# Patient Record
Sex: Male | Born: 1960 | Race: White | Hispanic: No | Marital: Single | Smoking: Former smoker
Health system: Southern US, Community
[De-identification: ages and names within clinical notes are randomized; demographics above are authoritative.]

## PROBLEM LIST (undated history)

## (undated) DIAGNOSIS — I499 Cardiac arrhythmia, unspecified: Secondary | ICD-10-CM

## (undated) DIAGNOSIS — I10 Essential (primary) hypertension: Secondary | ICD-10-CM

## (undated) DIAGNOSIS — I509 Heart failure, unspecified: Secondary | ICD-10-CM

## (undated) DIAGNOSIS — E785 Hyperlipidemia, unspecified: Secondary | ICD-10-CM

## (undated) DIAGNOSIS — I2510 Atherosclerotic heart disease of native coronary artery without angina pectoris: Secondary | ICD-10-CM

## (undated) DIAGNOSIS — I219 Acute myocardial infarction, unspecified: Secondary | ICD-10-CM

## (undated) DIAGNOSIS — I4891 Unspecified atrial fibrillation: Secondary | ICD-10-CM

---

## 2006-05-09 ENCOUNTER — Inpatient Hospital Stay (HOSPITAL_COMMUNITY): Admission: EM | Admit: 2006-05-09 | Discharge: 2006-05-11 | Payer: Self-pay | Admitting: Emergency Medicine

## 2006-06-04 ENCOUNTER — Ambulatory Visit: Payer: Self-pay | Admitting: Family Medicine

## 2006-06-25 ENCOUNTER — Ambulatory Visit: Payer: Self-pay | Admitting: *Deleted

## 2006-09-27 ENCOUNTER — Emergency Department (HOSPITAL_COMMUNITY): Admission: EM | Admit: 2006-09-27 | Discharge: 2006-09-27 | Payer: Self-pay | Admitting: *Deleted

## 2007-06-17 ENCOUNTER — Ambulatory Visit: Payer: Self-pay | Admitting: Internal Medicine

## 2008-01-20 ENCOUNTER — Ambulatory Visit: Payer: Self-pay | Admitting: Family Medicine

## 2008-01-20 LAB — CONVERTED CEMR LAB
ALT: 64 units/L — ABNORMAL HIGH (ref 0–53)
AST: 33 units/L (ref 0–37)
Albumin: 4.6 g/dL (ref 3.5–5.2)
Alkaline Phosphatase: 59 units/L (ref 39–117)
Basophils Absolute: 0 10*3/uL (ref 0.0–0.1)
Basophils Relative: 0 % (ref 0–1)
Chloride: 101 meq/L (ref 96–112)
Eosinophils Absolute: 0.3 10*3/uL (ref 0.0–0.7)
HDL: 68 mg/dL (ref >39)
LDL Cholesterol: 170 mg/dL — ABNORMAL HIGH (ref 0–99)
MCHC: 34.3 g/dL (ref 30.0–36.0)
MCV: 92.7 fL (ref 78.0–100.0)
Neutro Abs: 4.5 10*3/uL (ref 1.7–7.7)
Neutrophils Relative %: 58 % (ref 43–77)
Platelets: 220 10*3/uL (ref 150–400)
Potassium: 4.6 meq/L (ref 3.5–5.3)
Sodium: 139 meq/L (ref 135–145)
TSH: 1.784 microintl units/mL (ref 0.350–4.50)
Total Protein: 7.7 g/dL (ref 6.0–8.3)

## 2008-03-23 IMAGING — CR DG CHEST 2V
2 series · 2 of 2 positions shown · non-contrast
Comparison: none

HISTORY: Cough, congestion, dyspnea

CHEST 2 VIEWS:
No prior study for comparison.
Normal heart size, mediastinal contours, and vascularity.
Mild bronchitic changes.
No infiltrate, effusion, or pneumothorax.
Bones unremarkable.

[w chest pa]
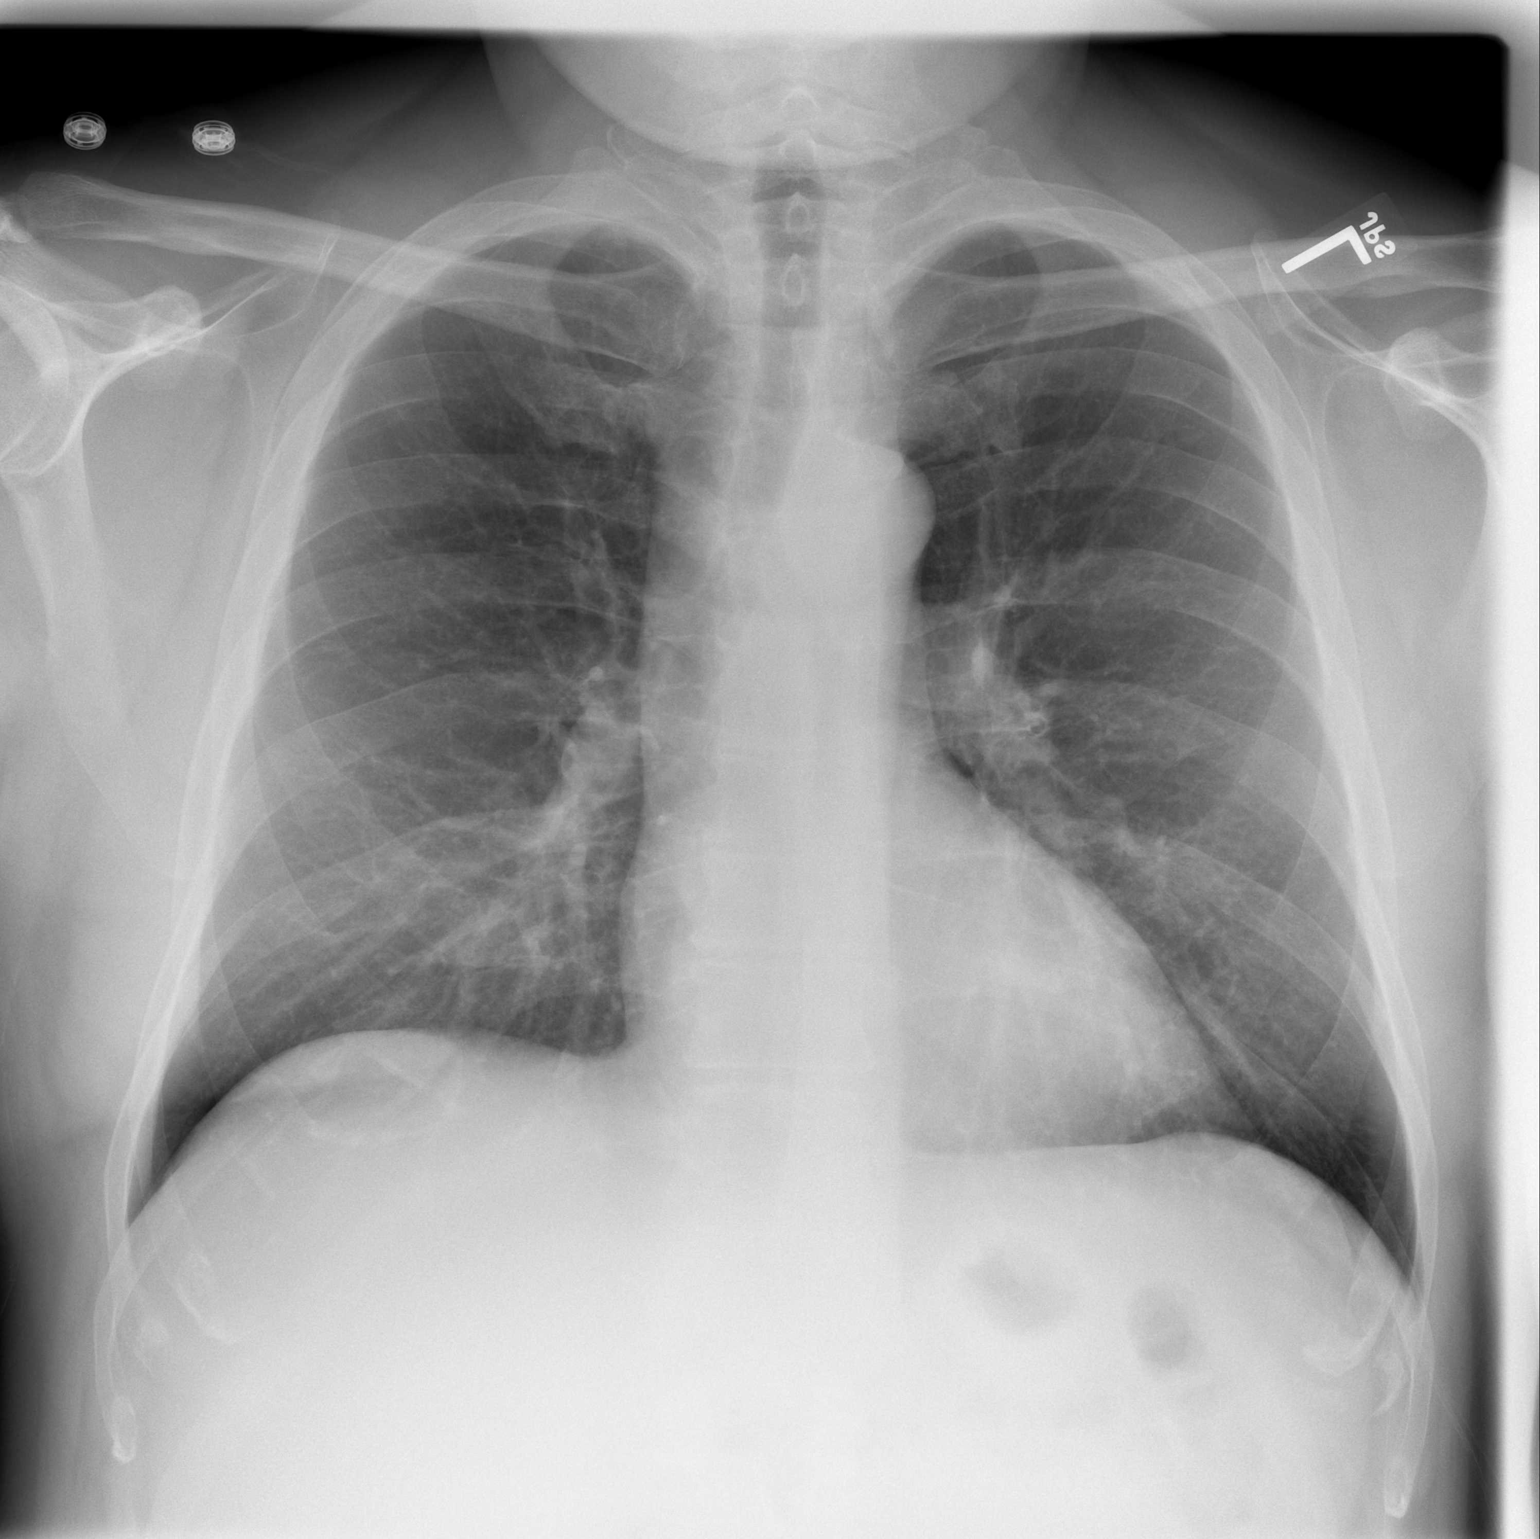

[w chest lat]
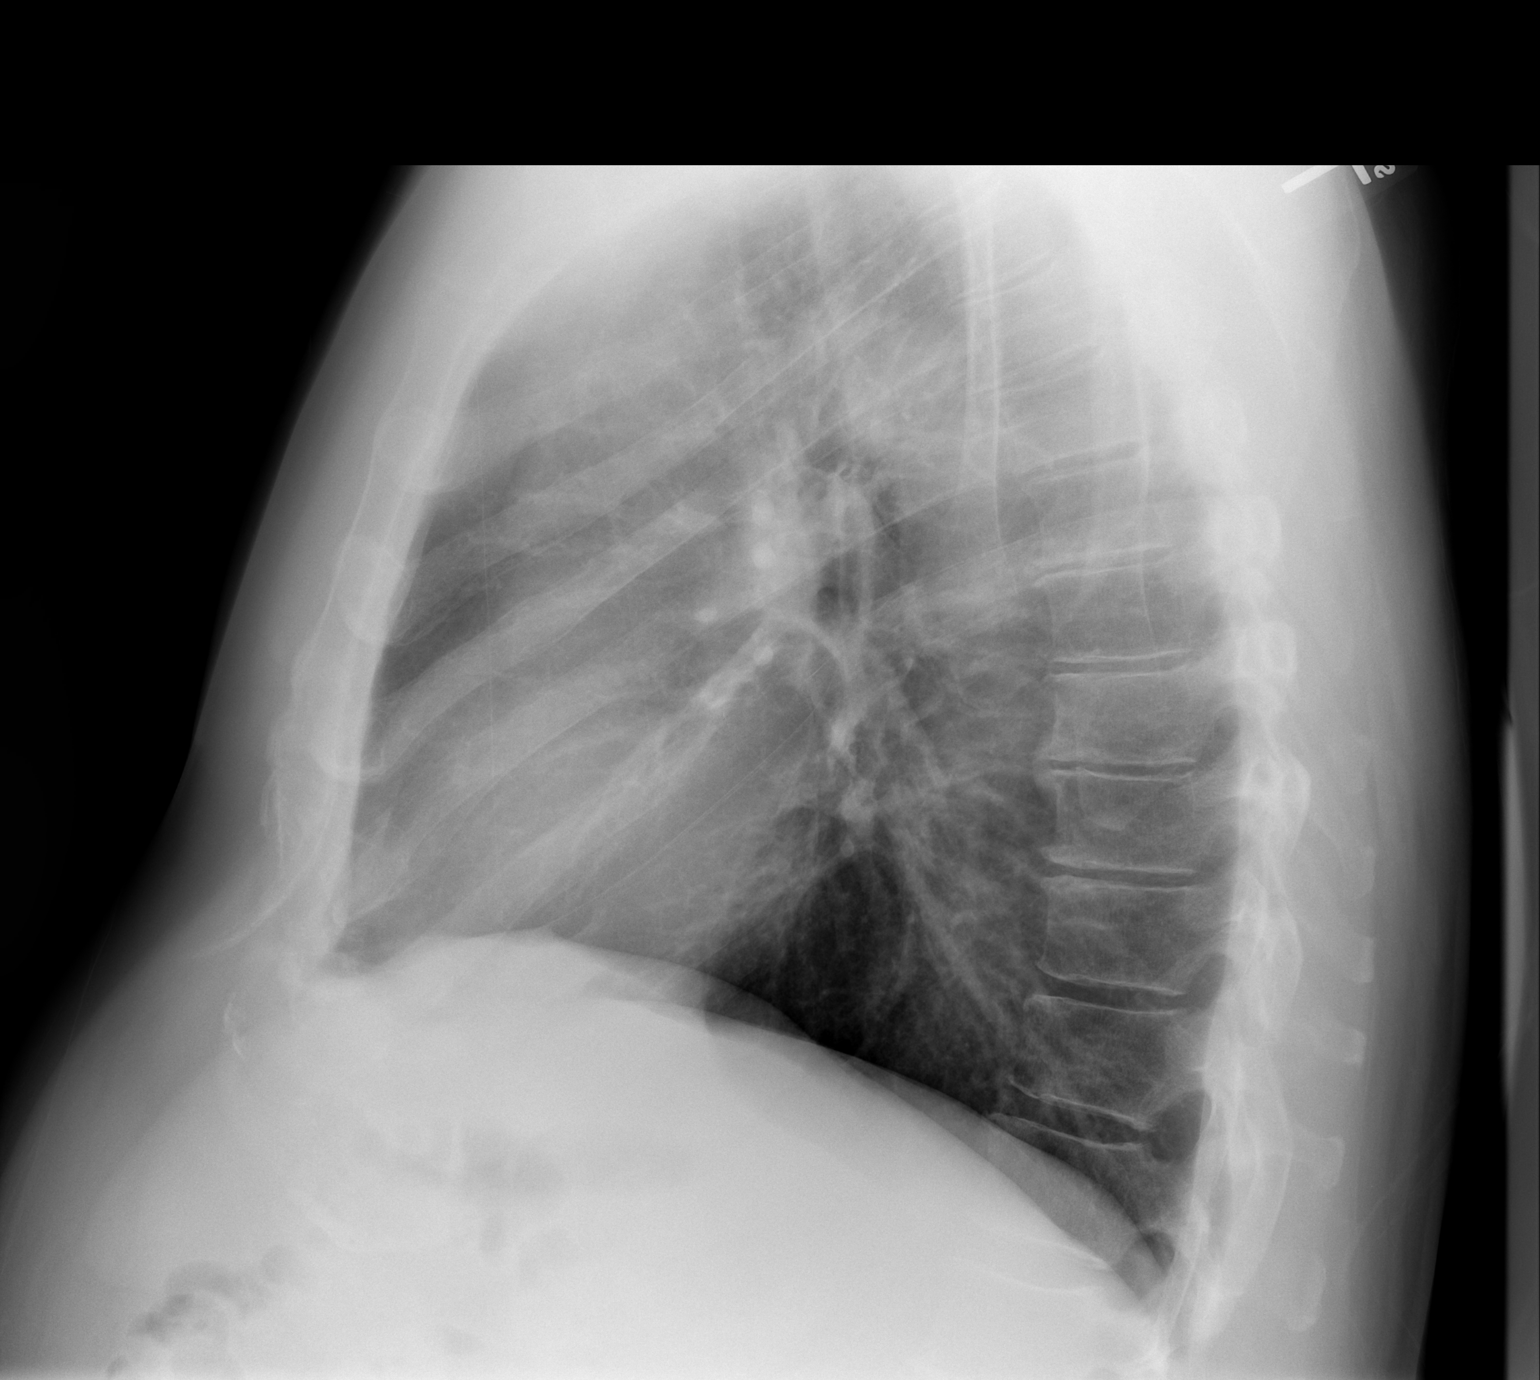

[2 of 2 positions shown; findings below may reference images not displayed]

IMPRESSION: Mild bronchitic changes.

## 2008-12-22 ENCOUNTER — Ambulatory Visit: Payer: Self-pay | Admitting: Internal Medicine

## 2009-03-11 ENCOUNTER — Ambulatory Visit: Payer: Self-pay | Admitting: Internal Medicine

## 2009-03-31 ENCOUNTER — Ambulatory Visit: Payer: Self-pay | Admitting: Internal Medicine

## 2009-03-31 LAB — CONVERTED CEMR LAB
ALT: 30 units/L (ref 0–53)
AST: 29 units/L (ref 0–37)
Calcium: 9 mg/dL (ref 8.4–10.5)
Chloride: 101 meq/L (ref 96–112)
Creatinine, Ser: 0.95 mg/dL (ref 0.40–1.50)
Potassium: 5 meq/L (ref 3.5–5.3)
Sodium: 137 meq/L (ref 135–145)
Total CHOL/HDL Ratio: 3.2
Total Protein: 7 g/dL (ref 6.0–8.3)
VLDL: 23 mg/dL (ref 0–40)

## 2009-04-07 ENCOUNTER — Ambulatory Visit: Payer: Self-pay | Admitting: Internal Medicine

## 2009-06-07 ENCOUNTER — Ambulatory Visit: Payer: Self-pay | Admitting: Internal Medicine

## 2009-06-07 LAB — CONVERTED CEMR LAB
AST: 22 units/L (ref 0–37)
Albumin: 4.8 g/dL (ref 3.5–5.2)
Alkaline Phosphatase: 51 units/L (ref 39–117)
Chloride: 103 meq/L (ref 96–112)
Glucose, Bld: 87 mg/dL (ref 70–99)
Hgb A1c MFr Bld: 5.7 % — ABNORMAL HIGH (ref <5.7)
LDL Cholesterol: 115 mg/dL — ABNORMAL HIGH (ref 0–99)
Potassium: 4.3 meq/L (ref 3.5–5.3)
Sodium: 138 meq/L (ref 135–145)
Total Protein: 7.7 g/dL (ref 6.0–8.3)

## 2009-09-02 ENCOUNTER — Ambulatory Visit: Payer: Self-pay | Admitting: Internal Medicine

## 2010-06-16 NOTE — Cardiovascular Report (Signed)
NAME:  Jeremiah Dixon, Jeremiah Dixon NO.:  [id number]   MEDICAL RECORD NO.:  [id number]          PATIENT TYPE:  INP   LOCATION:  6529                         FACILITY:  [facility]   PHYSICIAN:  Lyn Records, M.D.   DATE OF BIRTH:  [birth year]   DATE OF PROCEDURE:  05/10/2006  DATE OF DISCHARGE:                            CARDIAC CATHETERIZATION   INDICATIONS:  Non-ST-elevation myocardial infarction.   PROCEDURES:  1. Left heart catheterization.  2. Coronary angiography.  3. Left ventriculography.   DESCRIPTION:  After informed consent, a 6-French sheath was placed in  the right femoral artery using the modified Seldinger technique.  A 6-  [place] A2 multipurpose catheter was used for hemodynamic recordings,  left ventriculography by hand injection, and selective left coronary  angiography.  JR-4 was used for right coronary angiography.  Angio-Seal  was used for hemodynamic control after the procedure without  complications.   RESULTS:   HEMODYNAMIC DATA:  1. Aortic pressure 165/107.  2. Left ventricular pressure 166/14.   LEFT VENTRICULOGRAPHY:  Cinefluoroscopy prior to contrast demonstrates  distal right coronary, mid right coronary, and proximal circumflex  calcification.  Left ventricular cavity size is upper normal.  Contractility is symmetrically normal.  EF is 60%.  No MR.   CORONARY ANGIOGRAPHY:  1. Left main coronary:  Widely patent.  2. Left anterior descending coronary:  LAD contains luminal      irregularities throughout the proximal, mid and distal vessel.  No      significant stenoses are noted.  Several small diagonal branches      arise.  3. Circumflex:  Circumflex coronary artery is a moderate-size vessel      giving origin to a large second obtuse marginal and a small first      and third obtuse marginal.  The first obtuse marginal is subtotally      occluded.  The proximal circumflex in the region where this      marginal arises is slightly hazy.  No  definable significant      obstructive lesion is noted in this region, but there is      generalized narrowing less than 30% in this region of circumflex.  4. Right coronary:  The right coronary is large vessel giving PDA and      left ventricular branches.  Diffuse luminal irregularities less      than 40% are noted throughout the vessel and in the PDA.   CONCLUSION:  1. Acute coronary syndrome with probable thrombotic lesion in the      small first obtuse marginal origin and possibly some involvement in      the proximal circumflex but without significant obstructive lesion      at present.  2. Luminal irregularities in the left anterior descending, circumflex      and right coronary.  3. Normal left ventricular function.  4. Elevated blood pressure.   PLAN:  Aggressive risk factor modification including beta blocker  therapy for blood pressure and myocardial protection, aspirin as an  antiplatelet agent, and lipid therapy for control of significant  hyperlipidemia.  Nitroglycerin  p.r.n. for chest pain.   PLAN:  Discharge in a.m. if no discomfort off of Lovenox.  Ambulate 2  hours post procedure.  Follow up in 2 weeks.      Lyn Records, M.D.  Electronically Signed     HWS/MEDQ  D:  05/10/2006  T:  05/10/2006  Job:  [id number]

## 2010-06-16 NOTE — Discharge Summary (Signed)
NAME:  [name], [name] NO.:  [id number]   MEDICAL RECORD NO.:  [id number]          PATIENT TYPE:  INP   LOCATION:  6529                         FACILITY:  [facility]   PHYSICIAN:  Lyn Records, M.D.   DATE OF BIRTH:  28-May-1960   DATE OF ADMISSION:  05/09/2006  DATE OF DISCHARGE:  05/11/2006                               DISCHARGE SUMMARY   DISCHARGE DIAGNOSES:  1. Non-ST segment elevated myocardial infarction, status post cardiac      catheterization on May 10, 2006 under the care of Dr. Verdis Prime.  2. Smoker, tobacco cessation counseling.  3. Alcohol use, cessation counseling.  4. Dyslipidemia, treated.   Jeremiah Dixon is a 50 year old male patient who had intermittent 3 week  history of substernal chest pain/flu symptoms.  On arrival to the ER, he  had no chest pain, but wanted to be checked out.   His history is significant for fatigue.  He initially told me he did not  smoke, but apparently he does.  We then provided him with smoking  cessation counseling.  In addition, he said he drinks 2 quarts of beer  a day.  We did provide alcohol cessation counseling.   Lab studies were then performed and showed the following:  Hemoglobin  14.9, hematocrit 42.5, platelets 225, white count 5.2.  Sodium 137,  potassium 4.5, BUN 20.  LFTs normal.  Maximum CK is 174 with an MB  fraction of 5.5.  Troponin maximal at 0.41.  Total cholesterol 253, HDL  54, LDL 178, triglycerides 104.  TSH 2.198.  Also creatinine 1.0.   EKG shows normal sinus rhythm, rate 79 with T wave inversion in leads 3  and AVF.  No Q waves.   Because of the patient's non-ST segment elevated myocardial infarction,  he underwent cardiac catheterization on May 10, 2006 and was found to  have a small OM1 lesion that was hazy.  This vessel was subtotaled.  We  felt that medical management was the only option.  Otherwise, the  patient had nonobstructive disease with less than 40% lesions in the  LAD, circumflex.  His LV function was normal.  He was hypertensive  during the case and definitely needs to be aggressively treated.   The following day, the patient was ready for discharge to home.  He was  discharged to home on the following medications:  1. Enteric-coated aspirin 325 mg a day.  2. Metoprolol ER 50 mg a day.  3. Sublingual nitroglycerin p.r.n. chest pain.  4. Simvastatin 40 mg a day.   He is to remain on a low-sodium, heart-healthy diet.  Activity as per  cardiac rehabilitation.  Follow up with Dr. Katrinka Blazing in 2 weeks.   ADDENDUM:  Patient will need to find a primary care physician for  routine health care issues.      Guy Franco, P.A.      Lyn Records, M.D.  Electronically Signed    LB/MEDQ  D:  06/28/2006  T:  06/29/2006  Job:  [id number]

## 2014-04-13 ENCOUNTER — Encounter: Payer: Self-pay | Admitting: Emergency Medicine

## 2014-04-13 NOTE — Telephone Encounter (Signed)
WRONG PT 

## 2021-06-06 ENCOUNTER — Emergency Department (HOSPITAL_COMMUNITY)
Admission: EM | Admit: 2021-06-06 | Discharge: 2021-06-06 | Discharge status: Against Medical Advice | Payer: Managed Care, Other (non HMO) | Attending: Emergency Medicine | Admitting: Emergency Medicine

## 2021-06-06 ENCOUNTER — Encounter (HOSPITAL_COMMUNITY): Payer: Self-pay | Admitting: Emergency Medicine

## 2021-06-06 ENCOUNTER — Emergency Department (HOSPITAL_COMMUNITY): Payer: Managed Care, Other (non HMO)

## 2021-06-06 DIAGNOSIS — R0789 Other chest pain: Secondary | ICD-10-CM | POA: Insufficient documentation

## 2021-06-06 DIAGNOSIS — R079 Chest pain, unspecified: Secondary | ICD-10-CM

## 2021-06-06 DIAGNOSIS — E876 Hypokalemia: Secondary | ICD-10-CM | POA: Insufficient documentation

## 2021-06-06 DIAGNOSIS — I10 Essential (primary) hypertension: Secondary | ICD-10-CM | POA: Insufficient documentation

## 2021-06-06 DIAGNOSIS — E871 Hypo-osmolality and hyponatremia: Secondary | ICD-10-CM | POA: Insufficient documentation

## 2021-06-06 DIAGNOSIS — E119 Type 2 diabetes mellitus without complications: Secondary | ICD-10-CM | POA: Insufficient documentation

## 2021-06-06 HISTORY — DX: Acute myocardial infarction, unspecified: I21.9

## 2021-06-06 LAB — BASIC METABOLIC PANEL
Anion gap: 10 (ref 5–15)
BUN: 7 mg/dL (ref 6–20)
CO2: 20 mmol/L — ABNORMAL LOW (ref 22–32)
Calcium: 9.1 mg/dL (ref 8.9–10.3)
Chloride: 93 mmol/L — ABNORMAL LOW (ref 98–111)
Creatinine, Ser: 0.84 mg/dL (ref 0.61–1.24)
GFR, Estimated: >60 mL/min (ref >60)
Glucose, Bld: 113 mg/dL — ABNORMAL HIGH (ref 70–99)
Potassium: 3.2 mmol/L — ABNORMAL LOW (ref 3.5–5.1)
Sodium: 123 mmol/L — ABNORMAL LOW (ref 135–145)

## 2021-06-06 LAB — CBC
HCT: 40 % (ref 39.0–52.0)
Hemoglobin: 14 g/dL (ref 13.0–17.0)
MCH: 32.3 pg (ref 26.0–34.0)
MCHC: 35 g/dL (ref 30.0–36.0)
MCV: 92.2 fL (ref 80.0–100.0)
Platelets: 238 10*3/uL (ref 150–400)
RBC: 4.34 MIL/uL (ref 4.22–5.81)
RDW: 12 % (ref 11.5–15.5)
WBC: 7 10*3/uL (ref 4.0–10.5)
nRBC: 0 % (ref 0.0–0.2)

## 2021-06-06 LAB — URINALYSIS, ROUTINE W REFLEX MICROSCOPIC
Bilirubin Urine: NEGATIVE
Glucose, UA: NEGATIVE mg/dL
Hgb urine dipstick: NEGATIVE
Ketones, ur: NEGATIVE mg/dL
Leukocytes,Ua: NEGATIVE
Nitrite: NEGATIVE
Protein, ur: NEGATIVE mg/dL
Specific Gravity, Urine: 1.001 — ABNORMAL LOW (ref 1.005–1.030)
pH: 6 (ref 5.0–8.0)

## 2021-06-06 LAB — TROPONIN I (HIGH SENSITIVITY)
Troponin I (High Sensitivity): 20 ng/L — ABNORMAL HIGH (ref <18)
Troponin I (High Sensitivity): 39 ng/L — ABNORMAL HIGH (ref <18)

## 2021-06-06 MED ORDER — POTASSIUM CHLORIDE CRYS ER 20 MEQ PO TBCR
20.0000 meq | EXTENDED_RELEASE_TABLET | Freq: Once | ORAL | Status: AC
  Administered 2021-06-06: 20 meq via ORAL
  Filled 2021-06-06: qty 1

## 2021-06-06 NOTE — ED Triage Notes (Addendum)
EMS stated, he had chest pain for 15 to 20 min and the pain went away after Baby ASA given. He has had a MI before ?ASA 324 ?18g left AC ?

## 2021-06-06 NOTE — ED Provider Notes (Signed)
?[facility] ?Provider Note ? ? ?CSN: [id number] ?Arrival date & time: 06/06/21  1123 ? ?  ? ?History ? ?Chief Complaint  ?Patient presents with  ? Chest Pain  ? ? ?Jeremiah Dixon is a 61 y.o. male.  Patient presents to the hospital complaining of chest pain.  Patient states he was at work this morning when he began to have a sudden severe chest pain in the center of his chest.  He describes the pain as a heaviness.  He rated the pain as a 10 out of 10.  EMS was called and administered 324 mg baby aspirin.  Patient states pain was completely alleviated over 20 minutes.  Patient is currently pain-free.  Patient denies shortness of breath at the time of the incident.  Denies nausea, vomiting, radiation of symptoms.  Patient is currently asymptomatic.  Patient does have remote history of MI from 2007.  Past medical history significant for hypertension, diabetes, hyperlipidemia, previous MI ? ?HPI ? ?  ? ?Home Medications ?Prior to Admission medications   ?Not on File  ?   ? ?Allergies    ?Patient has no allergy information on record.   ? ?Review of Systems   ?Review of Systems  ?Constitutional:  Negative for fever.  ?Respiratory:  Negative for shortness of breath.   ?Cardiovascular:  Positive for chest pain.  ?     Chest pain described as 10/10, heaviness, now resolved  ?Gastrointestinal:  Negative for abdominal pain, nausea and vomiting.  ?Neurological:  Negative for syncope and headaches.  ? ?Physical Exam ?Updated Vital Signs ?BP (!) 145/82   Pulse 67   Temp 98.7 ?F (37.1 ?C) (Oral)   Resp 16   SpO2 98%  ?Physical Exam ?Vitals and nursing note reviewed.  ?Constitutional:   ?   General: He is not in acute distress. ?HENT:  ?   Head: Normocephalic and atraumatic.  ?Eyes:  ?   Extraocular Movements: Extraocular movements intact.  ?Cardiovascular:  ?   Rate and Rhythm: Normal rate and regular rhythm.  ?   Heart sounds: Normal heart sounds.  ?Pulmonary:  ?   Effort: Pulmonary effort  is normal.  ?   Breath sounds: Normal breath sounds.  ?Chest:  ?   Chest wall: No tenderness.  ?Abdominal:  ?   Palpations: Abdomen is soft.  ?Musculoskeletal:     ?   General: Normal range of motion.  ?   Cervical back: Normal range of motion and neck supple.  ?   Right lower leg: No edema.  ?   Left lower leg: No edema.  ?Skin: ?   General: Skin is warm and dry.  ?   Comments: 2 masses noted on patient's posterior in thoracic region, roughly equidistant to spine, bilateral, nontender  ?Neurological:  ?   Mental Status: He is alert.  ? ? ?ED Results / Procedures / Treatments   ?Labs ?(all labs ordered are listed, but only abnormal results are displayed) ?Labs Reviewed  ?BASIC METABOLIC PANEL - Abnormal; Notable for the following components:  ?    Result Value  ? Sodium 123 (*)   ? Potassium 3.2 (*)   ? Chloride 93 (*)   ? CO2 20 (*)   ? Glucose, Bld 113 (*)   ? All other components within normal limits  ?TROPONIN I (HIGH SENSITIVITY) - Abnormal; Notable for the following components:  ? Troponin I (High Sensitivity) 20 (*)   ? All other components within normal  limits  ?TROPONIN I (HIGH SENSITIVITY) - Abnormal; Notable for the following components:  ? Troponin I (High Sensitivity) 39 (*)   ? All other components within normal limits  ?CBC  ?URINALYSIS, ROUTINE W REFLEX MICROSCOPIC  ? ? ?EKG ?EKG Interpretation ? ?Date/Time:  Tuesday Jun 06 2021 11:28:46 EDT ?Ventricular Rate:  68 ?PR Interval:  218 ?QRS Duration: 102 ?QT Interval:  408 ?QTC Calculation: 433 ?R Axis:   41 ?Text Interpretation: Sinus rhythm with 1st degree A-V block T wave abnormality, consider inferior ischemia Abnormal ECG When compared with ECG of 11-May-2006 05:20, PREVIOUS ECG IS PRESENT No acute changes Confirmed by Derwood Kaplan [id number]) on 06/06/2021 3:41:46 PM ? ?Radiology ?DG Chest 1 View ? ?Result Date: 06/06/2021 ?CLINICAL DATA:  Central chest pain and history of myocardial infarction. EXAM: CHEST  1 VIEW COMPARISON:  05/09/2006 FINDINGS: The  heart size and mediastinal contours are within normal limits. There is no evidence of pulmonary edema, consolidation, pneumothorax, nodule or pleural fluid. The visualized skeletal structures are unremarkable. IMPRESSION: No active disease. Electronically Signed   By: Irish Lack M.D.   On: 06/06/2021 12:09   ? ?Procedures ?Procedures  ? ?Medications Ordered in ED ?Medications  ?potassium chloride SA (KLOR-CON M) CR tablet 20 mEq (20 mEq Oral Given 06/06/21 1540)  ? ? ?ED Course/ Medical Decision Making/ A&P ?  ?                        ?Medical Decision Making ?Amount and/or Complexity of Data Reviewed ?Labs: ordered. ?Radiology: ordered. ? ? ?This patient presents to the ED for concern of chest pain, this involves an extensive number of treatment options, and is a complaint that carries with it a high risk of complications and morbidity.  The differential diagnosis includes ACS, PE, musculoskeletal pain, others ? ? ?Co morbidities that complicate the patient evaluation ? ?History of MI, DM, hyperlipidemia, HTN ? ? ?Additional history obtained: ? ?Additional history obtained from EMS ?External records from outside source obtained and reviewed including primary care records showing patient's underlying medical conditions ? ? ?Lab Tests: ? ?I Ordered, and personally interpreted labs.  The pertinent results include: Sodium 123, potassium 3.2, CBC grossly normal, initial troponin 20, second troponin 39 ? ? ?Imaging Studies ordered: ? ?I ordered imaging studies including chest x-ray ?I independently visualized and interpreted imaging which showed no acute cardiopulmonary finding ?I agree with the radiologist interpretation ? ? ?Cardiac Monitoring: / EKG: ? ?The patient was maintained on a cardiac monitor.  I personally viewed and interpreted the cardiac monitored which showed an underlying rhythm of: Sinus rhythm with first-degree block ? ? ?Problem List / ED Course / Critical interventions / Medication management ? ?I  ordered medication including potassium for hypokalemia ?Reevaluation of the patient after these medicines showed that the patient stayed the same ?I have reviewed the patients home medicines and have made adjustments as needed ? ?Test / Admission - Considered: ? ?I discussed the patient's underlying lab values.  I discussed that I had some concern about the doubling in troponin from the initial to the second draw.  I recommended consultation with cardiology for recommendations.  I also recommended discussing potential admission with the hospitalist team due to the patient's hyponatremia with a value of 123.  The patient stated that he appreciated my time but stated that he would not stay at the hospital.  I explained that my advice would be to stay for admission and the  hospitalist agreed so that we can slowly replenish his sodium and also recommended waiting for a cardiology consult for further consideration of the increasing troponin level.  The patient once again denied and stated that he would like to go home.  I did recommend that the patient allow me to give him oral potassium and the patient agreed.  The patient is discharging home AGAINST MEDICAL ADVICE.   ? Date: 06/06/2021 ?Patient: Jeremiah Dixon ?Admitted: 06/06/2021 11:23 AM ?Attending Provider: Derwood Kaplan, MD ? ?Jeremiah Dixon or his authorized caregiver has made the decision for the patient to leave the emergency department against the advice of Derwood Kaplan, MD.  He or his authorized caregiver has been informed and understands the inherent risks, including death.  He or his authorized caregiver has decided to accept the responsibility for this decision. Jeremiah Dixon and all necessary parties have been advised that he may return for further evaluation or treatment. His condition at time of discharge was Serious.  Jeremiah Dixon had current vital signs as follows: ? ?Blood pressure (!) 145/82, pulse 67, temperature 98.7 ?F (37.1 ?C), temperature  source Oral, resp. rate 16, SpO2 98 %.  ? ?Loni Abdon states that he understands the risks of leaving and still desires to leave. Patient given strict return precautions ? ?Darrick Grinder ?06/06/2021 ?

## 2021-06-06 NOTE — Discharge Instructions (Addendum)
As discussed multiple times, I highly recommend staying at the hospital for admission to correct her sodium levels along with consultation with cardiology.  You have stated that she refused to stay at this time which is your right.  Please know that your condition is serious with your sodium level being as low as it is at 123 and with your troponin doubling it would be in your best interest to talk to cardiology.  Please return to the hospital immediately if you begin to have chest pain, shortness of breath, or any other life-threatening condition ?

## 2021-06-09 ENCOUNTER — Emergency Department (HOSPITAL_COMMUNITY)
Admission: EM | Admit: 2021-06-09 | Discharge: 2021-06-09 | Disposition: A | Discharge status: Home / Self Care | Payer: Managed Care, Other (non HMO) | Attending: Emergency Medicine | Admitting: Emergency Medicine

## 2021-06-09 ENCOUNTER — Emergency Department (HOSPITAL_COMMUNITY): Payer: Managed Care, Other (non HMO)

## 2021-06-09 ENCOUNTER — Encounter (HOSPITAL_COMMUNITY): Payer: Self-pay

## 2021-06-09 ENCOUNTER — Other Ambulatory Visit: Payer: Self-pay

## 2021-06-09 DIAGNOSIS — R9431 Abnormal electrocardiogram [ECG] [EKG]: Secondary | ICD-10-CM | POA: Diagnosis present

## 2021-06-09 LAB — BASIC METABOLIC PANEL
Anion gap: 9 (ref 5–15)
BUN: 10 mg/dL (ref 6–20)
CO2: 24 mmol/L (ref 22–32)
Calcium: 9.2 mg/dL (ref 8.9–10.3)
Chloride: 103 mmol/L (ref 98–111)
Creatinine, Ser: 0.76 mg/dL (ref 0.61–1.24)
GFR, Estimated: >60 mL/min (ref >60)
Glucose, Bld: 96 mg/dL (ref 70–99)
Potassium: 4.2 mmol/L (ref 3.5–5.1)
Sodium: 136 mmol/L (ref 135–145)

## 2021-06-09 LAB — CBC
HCT: 40.3 % (ref 39.0–52.0)
Hemoglobin: 13.7 g/dL (ref 13.0–17.0)
MCH: 32.2 pg (ref 26.0–34.0)
MCHC: 34 g/dL (ref 30.0–36.0)
MCV: 94.8 fL (ref 80.0–100.0)
Platelets: 242 10*3/uL (ref 150–400)
RBC: 4.25 MIL/uL (ref 4.22–5.81)
RDW: 12.8 % (ref 11.5–15.5)
WBC: 6.3 10*3/uL (ref 4.0–10.5)
nRBC: 0 % (ref 0.0–0.2)

## 2021-06-09 LAB — TROPONIN I (HIGH SENSITIVITY)
Troponin I (High Sensitivity): 39 ng/L — ABNORMAL HIGH (ref <18)
Troponin I (High Sensitivity): 36 ng/L — ABNORMAL HIGH (ref <18)

## 2021-06-09 NOTE — ED Triage Notes (Signed)
Pt was sent by PCP for an abnormal EKG. Pt denies chest pain, SOB, N/V. Pt states he feel fine.  ?

## 2021-06-09 NOTE — ED Provider Triage Note (Signed)
Emergency Medicine Provider Triage Evaluation Note ? ?Jeremiah Dixon , a 61 y.o. male  was evaluated in triage.  Pt complains of abnormal EKG from PCP office.  Denies any complaints at this time, including chest pain, shortness of breath, back pain, dizziness, lightheadedness, syncope.  Hx prior MI. ? ?Review of Systems  ?Positive: As above ?Negative: As above ? ?Physical Exam  ?BP (!) 151/82   Pulse 75   Temp 98.2 ?F (36.8 ?C) (Oral)   Resp 14   Ht 6' 5.5" (1.969 m)   Wt 125.6 kg   SpO2 98%   BMI 32.42 kg/m?  ?Gen:   Awake, no distress   ?Resp:  Normal effort, CTAB ?MSK:   Moves extremities without difficulty  ?Other:  RRR without M/R/G.  Abdomen soft nontender.  Upper and lower extremities.  Neurovascularly intact.  Back nontender. ? ?Medical Decision Making  ?Medically screening exam initiated at 1:56 PM.  Appropriate orders placed.  Jethro Radke was informed that the remainder of the evaluation will be completed by another provider, this initial triage assessment does not replace that evaluation, and the importance of remaining in the ED until their evaluation is complete. ? ?Labs, imaging, EKG ordered. ?  ?Cecil Cobbs, PA-C ?06/09/21 1412 ? ?

## 2021-06-09 NOTE — Discharge Instructions (Signed)
Call your primary care doctor to have them give you referral to a cardiologist.  Return here if you should develop any chest pain ?

## 2021-06-09 NOTE — ED Provider Notes (Signed)
?[facility] EMERGENCY DEPARTMENT ?Provider Note ? ? ?CSN: [id number] ?Arrival date & time: 06/09/21  1339 ? ?  ? ?History ? ?Chief Complaint  ?Patient presents with  ? Abnormal ECG  ? ? ?Nathaniel Wakeley is a 61 y.o. male. ? ?61 year old male presents from doctor's office due to concern for abnormal EKG.  Patient is no symptoms at this time of chest pain or shortness of breath.  States he is asymptomatic.  Was seen here 3 days ago after had episode of chest pain.  That visit was reviewed and he had 2 negative troponins at that time.  No evidence of ACS.  Since that time he has had no return of his symptoms.  Was only at his doctor for a follow-up visit.  Center for further ? ? ?  ? ?Home Medications ?Prior to Admission medications   ?Not on File  ?   ? ?Allergies    ?Patient has no known allergies.   ? ?Review of Systems   ?Review of Systems  ?All other systems reviewed and are negative. ? ?Physical Exam ?Updated Vital Signs ?BP (!) 163/92 (BP Location: Left Arm)   Pulse 79   Temp 98.1 ?F (36.7 ?C) (Oral)   Resp 18   Ht 1.969 m (6' 5.5")   Wt 125.6 kg   SpO2 99%   BMI 32.42 kg/m?  ?Physical Exam ?Vitals and nursing note reviewed.  ?Constitutional:   ?   General: He is not in acute distress. ?   Appearance: Normal appearance. He is well-developed. He is not toxic-appearing.  ?HENT:  ?   Head: Normocephalic and atraumatic.  ?Eyes:  ?   General: Lids are normal.  ?   Conjunctiva/sclera: Conjunctivae normal.  ?   Pupils: Pupils are equal, round, and reactive to light.  ?Neck:  ?   Thyroid: No thyroid mass.  ?   Trachea: No tracheal deviation.  ?Cardiovascular:  ?   Rate and Rhythm: Normal rate and regular rhythm.  ?   Heart sounds: Normal heart sounds. No murmur heard. ?  No gallop.  ?Pulmonary:  ?   Effort: Pulmonary effort is normal. No respiratory distress.  ?   Breath sounds: Normal breath sounds. No stridor. No decreased breath sounds, wheezing, rhonchi or rales.  ?Abdominal:  ?   General:  There is no distension.  ?   Palpations: Abdomen is soft.  ?   Tenderness: There is no abdominal tenderness. There is no rebound.  ?Musculoskeletal:     ?   General: No tenderness. Normal range of motion.  ?   Cervical back: Normal range of motion and neck supple.  ?Skin: ?   General: Skin is warm and dry.  ?   Findings: No abrasion or rash.  ?Neurological:  ?   Mental Status: He is alert and oriented to person, place, and time. Mental status is at baseline.  ?   GCS: GCS eye subscore is 4. GCS verbal subscore is 5. GCS motor subscore is 6.  ?   Cranial Nerves: No cranial nerve deficit.  ?   Sensory: No sensory deficit.  ?   Motor: Motor function is intact.  ?Psychiatric:     ?   Attention and Perception: Attention normal.     ?   Speech: Speech normal.     ?   Behavior: Behavior normal.  ? ? ?ED Results / Procedures / Treatments   ?Labs ?(all labs ordered are listed, but only abnormal results are  displayed) ?Labs Reviewed  ?TROPONIN I (HIGH SENSITIVITY) - Abnormal; Notable for the following components:  ?    Result Value  ? Troponin I (High Sensitivity) 39 (*)   ? All other components within normal limits  ?BASIC METABOLIC PANEL  ?CBC  ?TROPONIN I (HIGH SENSITIVITY)  ? ? ?EKG ?None ? ?ED ECG REPORT ? ? Date: 06/09/2021 ? Rate: 79 ? Rhythm: normal sinus rhythm ? QRS Axis: normal ? Intervals: normal ? ST/T Wave abnormalities: nonspecific ST changes ? Conduction Disutrbances:none ? Narrative Interpretation:  ? Old EKG Reviewed: unchanged ? ?I have personally reviewed the EKG tracing and agree with the computerized printout as noted. ? ? ?Radiology ?DG Chest 2 View ? ?Result Date: 06/09/2021 ?CLINICAL DATA:  Chest pain EXAM: CHEST - 2 VIEW COMPARISON:  06/06/2021 FINDINGS: The heart size and mediastinal contours are within normal limits. Both lungs are clear. Disc degenerative disease of the thoracic spine. IMPRESSION: No acute abnormality of the lungs. Electronically Signed   By: Jearld Lesch M.D.   On: 06/09/2021  14:42   ? ?Procedures ?Procedures  ? ? ?Medications Ordered in ED ?Medications - No data to display ? ?ED Course/ Medical Decision Making/ A&P ?  ?                        ?Medical Decision Making ?Amount and/or Complexity of Data Reviewed ?Labs: ordered. ?Radiology: ordered. ? ? ?Patient with negative delta troponin here.  Patient does not have any symptoms at this time.  Considered diagnoses such as ACS or aortic dissection but feel less unlikely.  Chest x-ray per my interpretation showed no acute findings.  Patient without return of chest pain symptoms here.  No indication for further evaluation at this time.  Heart score of 2.  We will follow-up with his doctor as needed ? ? ? ? ? ? ? ?Final Clinical Impression(s) / ED Diagnoses ?Final diagnoses:  ?None  ? ? ?Rx / DC Orders ?ED Discharge Orders   ? ? None  ? ?  ? ? ?  ?Lorre Nick, MD ?06/09/21 1906 ? ?

## 2021-06-13 ENCOUNTER — Encounter (HOSPITAL_COMMUNITY): Payer: Self-pay

## 2021-06-13 ENCOUNTER — Emergency Department (HOSPITAL_COMMUNITY)
Admission: EM | Admit: 2021-06-13 | Discharge: 2021-06-13 | Disposition: A | Discharge status: Home / Self Care | Payer: Managed Care, Other (non HMO) | Attending: Emergency Medicine | Admitting: Emergency Medicine

## 2021-06-13 ENCOUNTER — Other Ambulatory Visit: Payer: Self-pay

## 2021-06-13 ENCOUNTER — Emergency Department (HOSPITAL_COMMUNITY): Payer: Managed Care, Other (non HMO)

## 2021-06-13 DIAGNOSIS — I25119 Atherosclerotic heart disease of native coronary artery with unspecified angina pectoris: Secondary | ICD-10-CM | POA: Diagnosis not present

## 2021-06-13 DIAGNOSIS — E785 Hyperlipidemia, unspecified: Secondary | ICD-10-CM

## 2021-06-13 DIAGNOSIS — I2510 Atherosclerotic heart disease of native coronary artery without angina pectoris: Secondary | ICD-10-CM | POA: Insufficient documentation

## 2021-06-13 DIAGNOSIS — R072 Precordial pain: Secondary | ICD-10-CM | POA: Diagnosis not present

## 2021-06-13 DIAGNOSIS — I10 Essential (primary) hypertension: Secondary | ICD-10-CM | POA: Diagnosis not present

## 2021-06-13 DIAGNOSIS — R079 Chest pain, unspecified: Secondary | ICD-10-CM

## 2021-06-13 LAB — CBC WITH DIFFERENTIAL/PLATELET
Abs Immature Granulocytes: 0.03 10*3/uL (ref 0.00–0.07)
Basophils Absolute: 0 10*3/uL (ref 0.0–0.1)
Basophils Relative: 0 %
Eosinophils Absolute: 0.3 10*3/uL (ref 0.0–0.5)
Eosinophils Relative: 4 %
HCT: 35.4 % — ABNORMAL LOW (ref 39.0–52.0)
Hemoglobin: 12.6 g/dL — ABNORMAL LOW (ref 13.0–17.0)
Immature Granulocytes: 0 %
Lymphocytes Relative: 16 %
Lymphs Abs: 1.1 10*3/uL (ref 0.7–4.0)
MCH: 33.6 pg (ref 26.0–34.0)
MCHC: 35.6 g/dL (ref 30.0–36.0)
MCV: 94.4 fL (ref 80.0–100.0)
Monocytes Absolute: 0.7 10*3/uL (ref 0.1–1.0)
Monocytes Relative: 10 %
Neutro Abs: 4.6 10*3/uL (ref 1.7–7.7)
Neutrophils Relative %: 70 %
Platelets: 197 10*3/uL (ref 150–400)
RBC: 3.75 MIL/uL — ABNORMAL LOW (ref 4.22–5.81)
RDW: 12.5 % (ref 11.5–15.5)
WBC: 6.7 10*3/uL (ref 4.0–10.5)
nRBC: 0 % (ref 0.0–0.2)

## 2021-06-13 LAB — TROPONIN I (HIGH SENSITIVITY)
Troponin I (High Sensitivity): 11 ng/L (ref <18)
Troponin I (High Sensitivity): 12 ng/L (ref <18)

## 2021-06-13 LAB — COMPREHENSIVE METABOLIC PANEL
ALT: 45 U/L — ABNORMAL HIGH (ref 0–44)
AST: 35 U/L (ref 15–41)
Albumin: 3.8 g/dL (ref 3.5–5.0)
Alkaline Phosphatase: 56 U/L (ref 38–126)
Anion gap: 9 (ref 5–15)
BUN: 8 mg/dL (ref 6–20)
CO2: 21 mmol/L — ABNORMAL LOW (ref 22–32)
Calcium: 8.9 mg/dL (ref 8.9–10.3)
Chloride: 104 mmol/L (ref 98–111)
Creatinine, Ser: 0.78 mg/dL (ref 0.61–1.24)
GFR, Estimated: >60 mL/min (ref >60)
Glucose, Bld: 84 mg/dL (ref 70–99)
Potassium: 4 mmol/L (ref 3.5–5.1)
Sodium: 134 mmol/L — ABNORMAL LOW (ref 135–145)
Total Bilirubin: 1.6 mg/dL — ABNORMAL HIGH (ref 0.3–1.2)
Total Protein: 6.7 g/dL (ref 6.5–8.1)

## 2021-06-13 NOTE — ED Triage Notes (Signed)
Patient was standing at work, had onset of stabbing chest pain. Was given one nitro by EMS and the pain went away.  ?

## 2021-06-13 NOTE — ED Provider Notes (Signed)
?Jeremiah Dixon ?Provider Note ? ? ?CSN: [id number] ?Arrival date & time: 06/13/21  1305 ? ?  ? ?History ? ?Chief Complaint  ?Patient presents with  ? Chest Pain  ? ? ?Jeremiah Dixon is a 61 y.o. male. ? ?The history is provided by the patient and medical records. No language interpreter was used.  ?Chest Pain ?Pain location:  Substernal area ?Pain quality: aching and sharp   ?Pain radiates to:  Does not radiate ?Pain severity:  Severe ?Onset quality:  Sudden ?Duration:  15 minutes ?Timing:  Constant ?Progression:  Resolved ?Chronicity:  Recurrent ?Relieved by:  Nitroglycerin ?Worsened by:  Nothing ?Ineffective treatments:  None tried ?Associated symptoms: no abdominal pain, no back pain, no claudication, no cough, no diaphoresis, no dizziness, no dysphagia, no fatigue, no fever, no headache, no lower extremity edema, no nausea, no near-syncope, no numbness, no palpitations, no shortness of breath, no vomiting and no weakness   ?Risk factors: coronary artery disease and male sex   ? ?  ? ?Home Medications ?Prior to Admission medications   ?Not on File  ?   ? ?Allergies    ?Bee venom, Diclofenac, Amoxicillin, and Lisinopril   ? ?Review of Systems   ?Review of Systems  ?Constitutional:  Negative for chills, diaphoresis, fatigue and fever.  ?HENT:  Negative for congestion and trouble swallowing.   ?Eyes:  Negative for visual disturbance.  ?Respiratory:  Negative for cough, chest tightness, shortness of breath and wheezing.   ?Cardiovascular:  Positive for chest pain. Negative for palpitations, claudication, leg swelling and near-syncope.  ?Gastrointestinal:  Negative for abdominal pain, constipation, diarrhea, nausea and vomiting.  ?Genitourinary:  Negative for dysuria and flank pain.  ?Musculoskeletal:  Negative for back pain, neck pain and neck stiffness.  ?Skin:  Negative for rash and wound.  ?Neurological:  Negative for dizziness, weakness, light-headedness, numbness and headaches.   ?Psychiatric/Behavioral:  Negative for agitation and confusion.   ?All other systems reviewed and are negative. ? ?Physical Exam ?Updated Vital Signs ?BP 126/86   Pulse 70   Temp 98.5 ?F (36.9 ?C) (Oral)   Resp 16   SpO2 98%  ?Physical Exam ?Vitals and nursing note reviewed.  ?Constitutional:   ?   General: He is not in acute distress. ?   Appearance: He is well-developed. He is not ill-appearing, toxic-appearing or diaphoretic.  ?HENT:  ?   Head: Normocephalic and atraumatic.  ?Eyes:  ?   Extraocular Movements: Extraocular movements intact.  ?   Conjunctiva/sclera: Conjunctivae normal.  ?   Pupils: Pupils are equal, round, and reactive to light.  ?Cardiovascular:  ?   Rate and Rhythm: Normal rate and regular rhythm.  ?   Heart sounds: Normal heart sounds. No murmur heard. ?Pulmonary:  ?   Effort: Pulmonary effort is normal. No respiratory distress.  ?   Breath sounds: Normal breath sounds. No wheezing, rhonchi or rales.  ?Chest:  ?   Chest wall: No tenderness.  ?Abdominal:  ?   Palpations: Abdomen is soft.  ?   Tenderness: There is no abdominal tenderness.  ?Musculoskeletal:     ?   General: No swelling.  ?   Cervical back: Neck supple.  ?   Right lower leg: No tenderness. No edema.  ?   Left lower leg: No tenderness. No edema.  ?Skin: ?   General: Skin is warm and dry.  ?   Capillary Refill: Capillary refill takes less than 2 seconds.  ?   Findings:  No erythema or rash.  ?Neurological:  ?   Mental Status: He is alert.  ?Psychiatric:     ?   Mood and Affect: Mood normal.  ? ? ?ED Results / Procedures / Treatments   ?Labs ?(all labs ordered are listed, but only abnormal results are displayed) ?Labs Reviewed  ?CBC WITH DIFFERENTIAL/PLATELET - Abnormal; Notable for the following components:  ?    Result Value  ? RBC 3.75 (*)   ? Hemoglobin 12.6 (*)   ? HCT 35.4 (*)   ? All other components within normal limits  ?COMPREHENSIVE METABOLIC PANEL - Abnormal; Notable for the following components:  ? Sodium 134 (*)   ?  CO2 21 (*)   ? ALT 45 (*)   ? Total Bilirubin 1.6 (*)   ? All other components within normal limits  ?TROPONIN I (HIGH SENSITIVITY)  ?TROPONIN I (HIGH SENSITIVITY)  ? ? ?EKG ?None ?ED ECG REPORT ? ? Date: 06/13/2021 ? Rate: 66 ? Rhythm: normal sinus rhythm ? QRS Axis: normal ? Intervals: PR prolonged ? ST/T Wave abnormalities: nonspecific T wave changes ? Conduction Disutrbances: prolonged PR ? Narrative Interpretation:  ? Old EKG Reviewed: changes noted -less deep T wave inversions in lead III but changed from recent. ? ?I have personally reviewed the EKG tracing and agree with the computerized printout as noted. ? ? ? ?Radiology ?DG Chest 2 View ? ?Result Date: 06/13/2021 ?CLINICAL DATA:  Chest pain EXAM: CHEST - 2 VIEW COMPARISON:  Chest x-ray 06/09/2021 FINDINGS: Heart size and mediastinal contours are within normal limits. No suspicious pulmonary opacities identified. No pleural effusion or pneumothorax visualized. No acute osseous abnormality appreciated. IMPRESSION: No acute intrathoracic process identified. Electronically Signed   By: Ofilia Neas M.D.   On: 06/13/2021 14:29   ? ?Procedures ?Procedures  ? ? ?Medications Ordered in ED ?Medications - No data to display ? ?ED Course/ Medical Decision Making/ A&P ?  ?                        ?Medical Decision Making ?Amount and/or Complexity of Data Reviewed ?Labs: ordered. ?Radiology: ordered. ?ECG/medicine tests: ordered and independent interpretation performed. ? ? ? ?Jeremiah Dixon is a 61 y.o. male with a remote past medical history significant for MI who presents with recurrent chest pain.  According to patient, this is the third visit he has had in the last week for chest discomfort and EKG changes.  Patient said that his symptoms are atypical but today he had recurrent pain that was sharp in his central chest.  It did not radiate and was not pleuritic or exertional.  He did not have diaphoresis, nausea, vomiting, or lightheadedness/syncope.  He reports  no new leg pain or leg swelling.  No history of DVT or PE.  Denies palpitations.  Denies recent medication changes.  He reports that the pain was 10 out of 10 for about 15 minutes prior to taking nitroglycerin before it subsided with EMS.  He decided to return given the recurring chest discomfort and his history. ? ?Given the chest pain and his history of MI, patient be worked up for possible life-threatening etiology of his symptoms. ? ?On exam, lungs clear and chest nontender.  I did not appreciate a murmur.  Abdomen nontender.  Good pulses in upper and lower extremities.  Legs are nontender and not significantly edematous.  Patient had nontender back and flanks.  Patient resting comfortably without symptoms at this time. ? ?  EKG showed no STEMI but the T wave inversions in lead III during recent visit are not improved showing some changes from prior.  ? ?Given his history of MI and this being the third episode of chest pain in the last week prompting visit to the emergency department, anticipate consultation with cardiology to evaluate and help determine disposition this patient when work-up is completed ? ?3:28 PM ?Work-up has begun to return.  Initial troponin is normal.  CBC and CMP do not appear critically abnormal.  Chest x-ray reassuring. ? ?Given the patient's EKG changes for multiple visits, his intermittent chest pain and his history of MI, will call cardiology to evaluate and help determine disposition. ? ?Care transferred to oncoming team to await cardiology recommendations ? ? ? ? ? ? ? ?Final Clinical Impression(s) / ED Diagnoses ?Final diagnoses:  ?Chest pain, unspecified type  ? ? ?Clinical Impression: ?1. Chest pain, unspecified type   ? ? ?Disposition: Care transferred to oncoming team to await cardiology evaluation recommendations for intermittent chest pain with EKG changes. ? ?This note was prepared with assistance of Dragon voice recognition software. Occasional wrong-word or sound-a-like  substitutions may have occurred due to the inherent limitations of voice recognition software. ? ? ? ?  ?Rona Tomson, Gwenyth Allegra, MD ?06/13/21 1529 ? ?

## 2021-06-13 NOTE — Discharge Instructions (Signed)
Return to the Emergency Department if you have unusual chest pain, pressure, or discomfort, shortness of breath, nausea, vomiting, burping, heartburn, tingling upper body parts, sweating, cold, clammy skin, or racing heartbeat. Call 911 if you think you are having a heart attack. Take all cardiac medications as prescribed - notify your doctor if you have any side effects. Follow cardiac diet - avoid fatty & fried foods, don't eat too much red meat, eat lots of fruits & vegetables, and dairy products should be low fat. Please lose weight if you are overweight. Become more active with walking, gardening, or any other activity that gets you to moving. ?  ?Please return to the emergency department immediately for any new or concerning symptoms, or if you get worse. ? ? ?It was a pleasure caring for you today in the emergency department. ? ?Please return to the emergency department for any worsening or worrisome symptoms. ? ?Please follow up with your cardiologist ? ? ?

## 2021-06-13 NOTE — ED Provider Notes (Signed)
?  Provider Note ?MRN:  [id number]  ?Arrival date & time: 06/13/21    ?ED Course and Medical Decision Making  ?Assumed care from Tegeler, C at shift change. ? ?See not from prior team for complete details, in brief:  ?61 yo m ?Prior mi ?CP pta ? ?ECG nonischemic, tropes negative, chest x-ray negative. ? ?He is currently asymptomatic ? ?Compliant with his daily medications ? ?Cards coming to eval ? ?Observation was offered to patient however he prefers to go home and follow-up with his PCP / cardiologist outpatient next week. ? ?Cardiology has evaluated the patient, patient reports that he wants to follow-up with his outpatient cardiologist, does not want to stay in the hospital for observation.  Cardiology will write prescription for nitroglycerin.  ? ? ?Given patient is currently asymptomatic, hemodynamic stable; he has follow-up next week with his cardiologist.  Reasonable for discharge. ? ?The patient improved significantly and was discharged in stable condition. Detailed discussions were had with the patient regarding current findings, and need for close f/u with PCP or on call doctor. The patient has been instructed to return immediately if the symptoms worsen in any way for re-evaluation. Patient verbalized understanding and is in agreement with current care plan. All questions answered prior to discharge. ? ? ? EKG Interpretation ? ?Date/Time:  Tuesday Jun 13 2021 17:14:33 EDT ?Ventricular Rate:  71 ?PR Interval:  206 ?QRS Duration: 104 ?QT Interval:  416 ?QTC Calculation: 453 ?R Axis:   46 ?Text Interpretation: Sinus rhythm Borderline prolonged PR interval Probable left atrial enlargement similar to prior no stemi Confirmed by Tanda Rockers ([id number]) on 06/13/2021 5:15:23 PM ?  ? ?  ? ? ? ?Procedures ? ?Final Clinical Impressions(s) / ED Diagnoses  ? ?  ICD-10-CM   ?1. Chest pain, unspecified type  R07.9   ?  ?  ?ED Discharge Orders   ? ? None  ? ?  ?  ? ? ?Discharge Instructions   ? ?  ?Return to the Emergency  Department if you have unusual chest pain, pressure, or discomfort, shortness of breath, nausea, vomiting, burping, heartburn, tingling upper body parts, sweating, cold, clammy skin, or racing heartbeat. Call 911 if you think you are having a heart attack. Take all cardiac medications as prescribed - notify your doctor if you have any side effects. Follow cardiac diet - avoid fatty & fried foods, don't eat too much red meat, eat lots of fruits & vegetables, and dairy products should be low fat. Please lose weight if you are overweight. Become more active with walking, gardening, or any other activity that gets you to moving. ?  ?Please return to the emergency department immediately for any new or concerning symptoms, or if you get worse. ? ? ?It was a pleasure caring for you today in the emergency department. ? ?Please return to the emergency department for any worsening or worrisome symptoms. ? ?Please follow up with your cardiologist ? ? ? ? ? ? ? ? ?  ?Sloan Leiter, DO ?06/13/21 1716 ? ?

## 2021-06-13 NOTE — ED Notes (Signed)
Patient out to the desk multiple times asking about how long he'll have to wait, what are his test result, how much longer. Tried to explain the process to the patient but he doesn't seem willing to listen to what this nurse is saying. ?

## 2021-06-13 NOTE — ED Notes (Signed)
Patient refused last set of vital signs, states he is ready to  leave, his brother is on the way to pick him up. Patient given discharge instructions, all questions answered. Patient in possession of all belongings, directed to the discharge area.  ?

## 2021-06-13 NOTE — Consult Note (Signed)
?Cardiology Consultation:  ? ?Patient ID: Jeremiah SheerJohn Dixon ?MRN: [id number]; DOB: Jun 08, 1960 ? ?Admit date: 06/13/2021 ?Date of Consult: 06/13/2021 ? ?PCP:  Sandre KittyLonergan, Malia A, PA-C ?  ?[facility] Providers ?Cardiologist:  None      ? ? ?Patient Profile:  ? ?Jeremiah Dixon is a 61 y.o. male with a hx of CAD, HTN, HLD who is being seen 06/13/2021 for the evaluation of chest pain at the request of Dr Abundio Miuegelar. ? ?History of Present Illness:  ? ?Jeremiah Dixon is seen for evaluation of chest pain. This is his third ED visit for chest pain this past week. Seen initially on 5/9. Ecg showed T wave inversion inferiorly. No old Ecg to compare. Troponin was 20 then 39. It was recommended he be admitted but patient left AMA. Was sent back to ED on 3/12 after being seen by primary care and Ecg was abnormal. This was unchanged here. Troponin was 39 then 36. He was DC home. Returned today for evaluation of chest pain. We were asked to consult. On my entering the patient's room even before I introduced myself the patient was hostile stating he wanted to know the results of his tests and insisting that he wasn't going to stay. Tells me he has an appointment to see a Cardiologist next Thursday as an outpatient.  ? ?He did agree to talk to me. Reports that he hadn't had anything to eat all day. Developed a sharp mid sternal chest pain. No radiation. No SOB. No diaphoresis. Did not have Ntg to take. States similar to symptoms he had last week. Lasted about 15 minutes. Now resolved.  ? ?Patient does have a history of CAD. 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 He was treated medically. States at the time he was smoking and eating terrible. He lost 80 lbs and quit smoking. Feels he is in great shape. Does drink moderate amount of alcohol. No drugs. Reports compliance with meds. Is on ASA, metoprolol, amlodipine, statin and losartan but doesn't know doses. ? ? ? ?Past Medical History:  ?Diagnosis Date  ? MI (myocardial  infarction) (HCC)   ? ? ?History reviewed. No pertinent surgical history.  ? ?Home Medications:  ?Prior to Admission medications   ?Medication Sig Start Date End Date Taking? Authorizing Provider  ?amLODipine (NORVASC) 10 MG tablet Take 10 mg by mouth daily. 04/06/21  Yes [provider]  ?losartan (COZAAR) 50 MG tablet Take 50 mg by mouth daily. 04/06/21  Yes [provider]  ?lovastatin (MEVACOR) 40 MG tablet Take 40 mg by mouth daily. 04/06/21  Yes [provider]  ?metoprolol tartrate (LOPRESSOR) 50 MG tablet Take 50 mg by mouth 2 (two) times daily. 04/06/21  Yes [provider]  ? ? ?Inpatient Medications: ?Scheduled Meds: ? ?Continuous Infusions: ? ?PRN Meds: ? ? ?Allergies:    ?Allergies  ?Allergen Reactions  ? Bee Venom Palpitations  ? Diclofenac Diarrhea  ? Amoxicillin Diarrhea and Nausea Only  ? Lisinopril Cough  ? ? ?Social History:   ?Social History  ? ?Socioeconomic History  ? Marital status: Single  ?  Spouse name: Not on file  ? Number of children: Not on file  ? Years of education: Not on file  ? Highest education level: Not on file  ?Occupational History  ? Not on file  ?Tobacco Use  ? Smoking status: Former  ?  Years: 20.00  ?  Types: Cigarettes  ? Smokeless tobacco: Never  ?Vaping Use  ? Vaping Use:  Never used  ?Substance and Sexual Activity  ? Alcohol use: Yes  ?  Alcohol/week: 8.0 standard drinks  ?  Types: 8 Cans of beer per week  ? Drug use: Never  ? Sexual activity: Not on file  ?Other Topics Concern  ? Not on file  ?Social History Narrative  ? Not on file  ? ?Social Determinants of Health  ? ?Financial Resource Strain: Not on file  ?Food Insecurity: Not on file  ?Transportation Needs: Not on file  ?Physical Activity: Not on file  ?Stress: Not on file  ?Social Connections: Not on file  ?Intimate Partner Violence: Not on file  ?  ?Family History:   ?Notes family history of CAD in father ? ?ROS:  ?Please see the history of present illness.  ? ?All other ROS reviewed  and negative.    ? ?Physical Exam/Data:  ? ?Vitals:  ? 06/13/21 1307 06/13/21 1345 06/13/21 1517  ?BP: 126/86 116/78 (!) 142/81  ?Pulse: 70 75 71  ?Resp: 16 15 13   ?Temp: 98.5 ?F (36.9 ?C)    ?TempSrc: Oral    ?SpO2: 98% 95% 98%  ? ?No intake or output data in the 24 hours ending 06/13/21 1730 ? ?  06/09/2021  ?  1:50 PM  ?Last 3 Weights  ?Weight (lbs) 277 lb  ?Weight (kg) 125.646 kg  ?   ?There is no height or weight on file to calculate BMI.  ?General:  Well nourished, overweight, in no acute distress ?HEENT: normal ?Neck: no JVD ?Vascular: No carotid bruits; Distal pulses 2+ bilaterally ?Cardiac:  normal S1, S2; RRR; no murmur  ?Lungs:  clear to auscultation bilaterally, no wheezing, rhonchi or rales  ?Abd: soft, nontender, no hepatomegaly  ?Ext: no edema ?Musculoskeletal:  No deformities, BUE and BLE strength normal and equal ?Skin: warm and dry  ?Neuro:  CNs 2-12 intact, no focal abnormalities noted ?Psych:  Normal affect  ? ?EKG:  The EKG was personally reviewed and demonstrates:  NSR with T wave inversion. ?Telemetry:  Telemetry was personally reviewed and demonstrates:  NSR ? ?Relevant CV Studies: ?Cath report reviewed from 2007 ? ?Laboratory Data: ? ?High Sensitivity Troponin:   ?Recent Labs  ?Lab 06/06/21 ?1353 06/09/21 ?1357 06/09/21 ?1737 06/13/21 ?1315 06/13/21 ?1605  ?TROPONINIHS 39* 39* 36* 11 12  ?   ?Chemistry ?Recent Labs  ?Lab 06/09/21 ?1357 06/13/21 ?1315  ?NA 136 134*  ?K 4.2 4.0  ?CL 103 104  ?CO2 24 21*  ?GLUCOSE 96 84  ?BUN 10 8  ?CREATININE 0.76 0.78  ?CALCIUM 9.2 8.9  ?GFRNONAA >60 >60  ?ANIONGAP 9 9  ?  ?Recent Labs  ?Lab 06/13/21 ?1315  ?PROT 6.7  ?ALBUMIN 3.8  ?AST 35  ?ALT 45*  ?ALKPHOS 56  ?BILITOT 1.6*  ? ?Lipids No results for input(s): CHOL, TRIG, HDL, LABVLDL, LDLCALC, CHOLHDL in the last 168 hours.  ?Hematology ?Recent Labs  ?Lab 06/09/21 ?1357 06/13/21 ?1315  ?WBC 6.3 6.7  ?RBC 4.25 3.75*  ?HGB 13.7 12.6*  ?HCT 40.3 35.4*  ?MCV 94.8 94.4  ?MCH 32.2 33.6  ?MCHC 34.0 35.6  ?RDW 12.8  12.5  ?PLT 242 197  ? ?Thyroid No results for input(s): TSH, FREET4 in the last 168 hours.  ?BNPNo results for input(s): BNP, PROBNP in the last 168 hours.  ?DDimer No results for input(s): DDIMER in the last 168 hours. ? ? ?Radiology/Studies:  ?DG Chest 2 View ? ?Result Date: 06/13/2021 ?CLINICAL DATA:  Chest pain EXAM: CHEST - 2 VIEW COMPARISON:  Chest x-ray 06/09/2021 FINDINGS: Heart size and mediastinal contours are within normal limits. No suspicious pulmonary opacities identified. No pleural effusion or pneumothorax visualized. No acute osseous abnormality appreciated. IMPRESSION: No acute intrathoracic process identified. Electronically Signed   By: Jannifer Hick M.D.   On: 06/13/2021 14:29   ? ? ?Assessment and Plan:  ? ?Chest pain. Symptoms are somewhat atypical but patient at high risk due to risk factors and prior history with known occlusion of OM 1 in 2007.  Troponins were mildly elevated with flat trend last week but are now normal. Ecg is unchanged. I think patient needs ischemic evaluation. He refuses to stay today. I offered to arrange stress testing versus coronary CTA as outpatient but patient states he plans on keeping his appointment with Cardiology on Thursday. This apparently is with a different group. I recommend he have Ntg on hand to take. Continue his medical therapy. If he has recurrent pain he should return to ED.  ? ? ?Risk Assessment/Risk Scores:  ?   ?HEAR Score (for undifferentiated chest pain):    ?  ?  ? ?   ? ?For questions or updates, please contact [facility] ?Please consult [email or web address] for contact info under  ? ? ?Signed, ?Taijon Vink [place], MD  ?06/13/2021 5:30 PM  ?

## 2021-06-22 ENCOUNTER — Other Ambulatory Visit (HOSPITAL_COMMUNITY): Payer: Self-pay | Admitting: Cardiology

## 2021-06-22 ENCOUNTER — Other Ambulatory Visit: Payer: Self-pay | Admitting: Cardiology

## 2021-06-22 DIAGNOSIS — R079 Chest pain, unspecified: Secondary | ICD-10-CM

## 2021-07-05 ENCOUNTER — Encounter (HOSPITAL_COMMUNITY)
Admission: RE | Admit: 2021-07-05 | Discharge: 2021-07-05 | Disposition: A | Discharge status: Home / Self Care | Payer: Managed Care, Other (non HMO) | Source: Ambulatory Visit | Attending: Cardiology | Admitting: Cardiology

## 2021-07-05 DIAGNOSIS — R079 Chest pain, unspecified: Secondary | ICD-10-CM | POA: Insufficient documentation

## 2021-07-05 MED ORDER — TECHNETIUM TC 99M TETROFOSMIN IV KIT
30.5000 | PACK | Freq: Once | INTRAVENOUS | Status: AC | PRN
  Administered 2021-07-05: 30.5 via INTRAVENOUS

## 2021-07-05 MED ORDER — TECHNETIUM TC 99M TETROFOSMIN IV KIT
10.2000 | PACK | Freq: Once | INTRAVENOUS | Status: AC | PRN
  Administered 2021-07-05: 10.2 via INTRAVENOUS

## 2021-07-05 MED ORDER — REGADENOSON 0.4 MG/5ML IV SOLN: 0.4000 mg | Freq: Once | INTRAVENOUS | Status: AC

## 2021-07-05 MED ORDER — REGADENOSON 0.4 MG/5ML IV SOLN
INTRAVENOUS | Status: AC
  Administered 2021-07-05: 0.4 mg via INTRAVENOUS
  Filled 2021-07-05: qty 5

## 2022-05-08 ENCOUNTER — Other Ambulatory Visit: Payer: Self-pay | Admitting: Family Medicine

## 2022-05-08 DIAGNOSIS — L02212 Cutaneous abscess of back [any part, except buttock]: Secondary | ICD-10-CM

## 2022-07-13 ENCOUNTER — Encounter: Payer: Self-pay | Admitting: Gastroenterology

## 2022-07-20 ENCOUNTER — Encounter: Payer: Self-pay | Admitting: Gastroenterology

## 2022-07-20 ENCOUNTER — Ambulatory Visit (INDEPENDENT_AMBULATORY_CARE_PROVIDER_SITE_OTHER): Payer: Managed Care, Other (non HMO) | Admitting: Gastroenterology

## 2022-07-20 VITALS — BP 110/70 | HR 80 | Ht 74.0 in | Wt 276.5 lb

## 2022-07-20 DIAGNOSIS — R0789 Other chest pain: Secondary | ICD-10-CM

## 2022-07-20 DIAGNOSIS — Z1211 Encounter for screening for malignant neoplasm of colon: Secondary | ICD-10-CM | POA: Diagnosis not present

## 2022-07-20 MED ORDER — OMEPRAZOLE 40 MG PO CPDR: 40.0000 mg | DELAYED_RELEASE_CAPSULE | Freq: Two times a day (BID) | ORAL | 2 refills | Status: DC

## 2022-07-20 NOTE — Progress Notes (Signed)
07/20/2022 Jeremiah Dixon [id number] 12-05-1960   HISTORY OF PRESENT ILLNESS: This is a 62 year old male who is new to our office.  Has been referred here by Barry Brunner, PA-C, for evaluation for possible peptic ulcer.  He tells me that for the past year or more he has been having pain in his upper abdomen that sometimes radiates into his chest and into his back.  He was just recently placed on omeprazole 20 mg daily and has maybe noticed slight improvement with that.  He has never had any type of endoscopic evaluation in the past including colonoscopy.  He denies any dysphagia.  Says his appetite is good.  No weight loss.  He does not really feel like it is reflux related.  He did some cardiac workup last year that was unremarkable.  He says that after he eats that it seems to feel better for only about 15 minutes or so and then starts bothering him again.  He denies NSAID use, only rare occasion.  No nausea or vomiting.  No black or bloody stools.   Past Medical History:  Diagnosis Date   MI (myocardial infarction) (HCC)    History reviewed. No pertinent surgical history.  reports that he has quit smoking. His smoking use included cigarettes. He has never used smokeless tobacco. He reports current alcohol use of about 8.0 standard drinks of alcohol per week. He reports that he does not use drugs. family history is not on file. Allergies  Allergen Reactions   Bee Venom Palpitations   Diclofenac Diarrhea   Amoxicillin Diarrhea and Nausea Only   Lisinopril Cough   Pantoprazole Nausea Only    Other Reaction(s): chest pain      Outpatient Encounter Medications as of 07/20/2022  Medication Sig   amLODipine (NORVASC) 10 MG tablet Take 10 mg by mouth daily.   losartan (COZAAR) 100 MG tablet Take 100 mg by mouth daily.   lovastatin (MEVACOR) 40 MG tablet Take 40 mg by mouth daily.   metoprolol tartrate (LOPRESSOR) 50 MG tablet Take 50 mg by mouth 2 (two) times daily.    omeprazole (PRILOSEC) 40 MG capsule Take 1 capsule (40 mg total) by mouth 2 (two) times daily.   [DISCONTINUED] omeprazole (PRILOSEC) 20 MG capsule Take 20 mg by mouth daily.   nitroGLYCERIN (NITROSTAT) 0.4 MG SL tablet Place 0.4 mg under the tongue every 5 (five) minutes as needed. (Patient not taking: Reported on 07/20/2022)   [DISCONTINUED] losartan (COZAAR) 50 MG tablet Take 50 mg by mouth daily.   No facility-administered encounter medications on file as of 07/20/2022.     REVIEW OF SYSTEMS  : All other systems reviewed and negative except where noted in the History of Present Illness.   PHYSICAL EXAM: BP 110/70 (BP Location: Left Arm, Patient Position: Sitting, Cuff Size: Large)   Pulse 80   Ht 6\' 2"  (1.88 m)   Wt 276 lb 8 oz (125.4 kg)   BMI 35.50 kg/m  General: Well developed white male in no acute distress Head: Normocephalic and atraumatic Eyes:  sclerae anicteric,conjunctive pink. Ears: Normal auditory acuity Lungs: Clear throughout to auscultation; no W/R/R. Heart: Regular rate and rhythm; no M/R/G. Abdomen: Soft, non-distended.  BS present.  Non-tender. Musculoskeletal: Symmetrical with no gross deformities  Skin: No lesions on visible extremities. Extremities: No edema. Neurological: Alert oriented x 4, grossly non-focal Psychological:  Alert and cooperative. Normal mood and affect  ASSESSMENT AND PLAN: *Atypical chest pain: Has been having issues  with epigastric abdominal pain that radiates into his chest for about a year.  Had unremarkable cardiac evaluation.  He does not feel it is reflux related, but PCP recently put him on omeprazole 20 mg daily with may be very slight improvement with that.  We discussed endoscopy.  He would like to try increased dose of omeprazole first.  Will increase to 40 mg twice daily.  Prescription sent to pharmacy.  If this does not help we will proceed with EGD.  Will await the results of the below Cologuard and could perform both of these  together if needed then. *Colorectal cancer screening: He has never had a colonoscopy in the past.  He is going to do Cologuard first at his request.  If that is positive he understands he would need to proceed with colonoscopy.  CC:  Sandre Kitty, PA-C

## 2022-07-20 NOTE — Patient Instructions (Signed)
We have sent the following medications to your pharmacy for you to pick up at your convenience: Omeprazole 40 mg twice daily 30-60 minutes before breakfast and dinner.   Your provider has ordered Cologuard testing as an option for colon cancer screening. This is performed by [employer] and may be out of network with your insurance. PRIOR to completing the test, it is YOUR responsibility to contact your insurance about covered benefits for this test. Your out of pocket expense could be anywhere from $0.00 to $649.00.   When you call to check coverage with your insurer, please provide the following information:   -The ONLY provider of Cologuard is [facility], dispensing  - CPT code for Cologuard is [phone].  [facility] NPI # [id number]  -[employer] Tax ID # [id number]   We have already sent your demographic and insurance information to [employer] (phone number [phone]) and they should contact you within the next week regarding your test. If you have not heard from them within the next week, please call our office at [phone].  _______________________________________________________  If your blood pressure at your visit was 140/90 or greater, please contact your primary care physician to follow up on this.  _______________________________________________________  If you are age 80 or older, your body mass index should be between 23-30. Your Body mass index is 35.5 kg/m. If this is out of the aforementioned range listed, please consider follow up with your Primary Care Provider.  If you are age 93 or younger, your body mass index should be between 19-25. Your Body mass index is 35.5 kg/m. If this is out of the aformentioned range listed, please consider follow up with your Primary Care Provider.   ________________________________________________________  The [facility] providers would like to encourage you to use [facility] to communicate  with providers for non-urgent requests or questions.  Due to long hold times on the telephone, sending your provider a message by [facility] may be a faster and more efficient way to get a response.  Please allow 48 business hours for a response.  Please remember that this is for non-urgent requests.  _______________________________________________________

## 2022-07-23 NOTE — Progress Notes (Signed)
Agree with the assessment and plan as outlined by Jessica Zehr, PA-C.  Mina Babula E. Jassmine Vandruff, MD  [facility]  

## 2022-07-27 ENCOUNTER — Telehealth: Payer: Self-pay

## 2022-07-27 ENCOUNTER — Telehealth: Payer: Self-pay | Admitting: Gastroenterology

## 2022-07-27 NOTE — Telephone Encounter (Signed)
*  Gastro  PA request received for Omeprazole 40MG  dr capsules  PA submitted to [facility] via CMM and is pending additional questions/determination  Key: [id number]

## 2022-07-27 NOTE — Telephone Encounter (Signed)
Please submit for PA for Omeprazole 40 mg  twice daily.

## 2022-07-27 NOTE — Telephone Encounter (Signed)
Inbound call from patient stating that his PCP had proscribed him Prilosec 20 mg once daily and was seen with Shanda Bumps on 6/21 and changed the prescription to 40 mg two times daily. Patient stated before [employer] would cover it and now they will not. Patient is requesting that we contact his insurance and make them aware that the increase of medication is needed. Please advise.

## 2022-07-27 NOTE — Telephone Encounter (Signed)
PA has been submitted, will be updated in other encounter created.

## 2022-07-27 NOTE — Telephone Encounter (Signed)
PA has been APPROVED from 07/27/2022-07/27/2023

## 2022-07-30 NOTE — Telephone Encounter (Signed)
Omeprazole 40 mg twice daily is approved.

## 2022-08-01 LAB — COLOGUARD: COLOGUARD: NEGATIVE

## 2022-08-09 ENCOUNTER — Telehealth: Payer: Self-pay | Admitting: Gastroenterology

## 2022-08-09 DIAGNOSIS — K219 Gastro-esophageal reflux disease without esophagitis: Secondary | ICD-10-CM

## 2022-08-09 DIAGNOSIS — R0789 Other chest pain: Secondary | ICD-10-CM

## 2022-08-09 NOTE — Telephone Encounter (Signed)
Spoke with patient. Let him know that the Omeprazole 40 mg twice daily was approved. Patient stated that dosage is not helping at all so he has stopped this medication. Can he be switched to something different? Please advise.

## 2022-08-09 NOTE — Telephone Encounter (Signed)
Inbound call from patient stating a call was made to his brother for him. Requesting a call back to his phone number. Patient was not sure what the call was regarding. Please advise, thank you.

## 2022-08-13 NOTE — Telephone Encounter (Signed)
Spoke with patient and he states this is week 4 of him trying Omeprazole twice daily with no improvement and has actually had to add in OTC Zantac. Please advise.

## 2022-08-13 NOTE — Telephone Encounter (Signed)
Called patient unable to leave message, voicemail box not set up.

## 2022-08-17 MED ORDER — DEXLANSOPRAZOLE 60 MG PO CPDR: 60.0000 mg | DELAYED_RELEASE_CAPSULE | Freq: Every day | ORAL | 3 refills | Status: DC

## 2022-08-17 NOTE — Telephone Encounter (Signed)
Sent in script for Dexilant. Unable to leave message voicemail box not set up.

## 2022-08-21 ENCOUNTER — Other Ambulatory Visit (HOSPITAL_COMMUNITY): Payer: Self-pay

## 2022-08-21 ENCOUNTER — Telehealth: Payer: Self-pay | Admitting: Pharmacy Technician

## 2022-08-21 ENCOUNTER — Telehealth: Payer: Self-pay | Admitting: *Deleted

## 2022-08-21 NOTE — Telephone Encounter (Signed)
PA has been subitted and telephone encounter has been created.

## 2022-08-21 NOTE — Telephone Encounter (Signed)
Inbound call from patient states he would like to continue taking omeprazole medication due to the medication working. States other recommendation was costly.

## 2022-08-21 NOTE — Telephone Encounter (Signed)
Patient insurance not covering Dexilant. Patient has tried and failed Omeprazole, Famotidine. Please submit PA.

## 2022-08-21 NOTE — Telephone Encounter (Signed)
Spoke with patient and Dexilant was not approved from his insurance. He would like [place] to submit a PA. Scheduled EGD for 10/04/22 @ 10 am.

## 2022-08-21 NOTE — Telephone Encounter (Signed)
Pharmacy Patient Advocate Encounter   Received notification from Pt Calls Messages that prior authorization for DEXLANSOPRAZOLE 60MG  is required/requested.   Insurance verification completed.   The patient is insured through [facility] .   Per test claim: PA submitted to [facility] via CoverMyMeds Key/confirmation #/EOC [id number] Status is pending

## 2022-08-29 ENCOUNTER — Other Ambulatory Visit: Payer: Self-pay

## 2022-08-29 MED ORDER — ESOMEPRAZOLE MAGNESIUM 40 MG PO CPDR: 40.0000 mg | DELAYED_RELEASE_CAPSULE | Freq: Two times a day (BID) | ORAL | 3 refills | Status: DC

## 2022-08-29 NOTE — Telephone Encounter (Signed)
Pharmacy Patient Advocate Encounter  Received notification from [facility] that Prior Authorization for DEXLANSOPRAZOLE 60 MG DR has been DENIED. Please advise how you'd like to proceed. Full denial letter will be uploaded to the media tab. See denial reason below.  PA #/Case ID/Reference #: [id number]  Please be advised we currently do not have a Pharmacist to review denials, therefore you will need to process appeals accordingly as needed. Thanks for your support at this time. Contact for appeals are as follows: Phone: [phone], Fax:  [phone]

## 2022-08-30 ENCOUNTER — Telehealth: Payer: Self-pay | Admitting: Pharmacy Technician

## 2022-08-30 ENCOUNTER — Other Ambulatory Visit (HOSPITAL_COMMUNITY): Payer: Self-pay

## 2022-08-30 NOTE — Telephone Encounter (Signed)
Pharmacy Patient Advocate Encounter   Received notification from CoverMyMeds that prior authorization for ESOMEPRAZOLE 40MG  is required/requested.   Insurance verification completed.   The patient is insured through [facility] .   Per test claim: PA required; PA submitted to [facility] via CoverMyMeds Key/confirmation #/EOC [id number] Status is pending

## 2022-08-31 ENCOUNTER — Other Ambulatory Visit (HOSPITAL_COMMUNITY): Payer: Self-pay

## 2022-08-31 NOTE — Telephone Encounter (Signed)
Pharmacy Patient Advocate Encounter  Received notification from [facility] that Prior Authorization for ESOMEPRAZOLE MAG 40MG  has been APPROVED from 08/30/2022 to 08/01/224. Ran test claim, Copay is $unavailable at this time  PA #/Case ID/Reference #: [id number]  Medication was filled on 08/30/2022 and next available refill date is 11/10/2022 per test claim.

## 2022-09-03 ENCOUNTER — Telehealth: Payer: Self-pay | Admitting: Gastroenterology

## 2022-09-03 NOTE — Telephone Encounter (Signed)
Patient is calling to have someone from pre cert advise him on what his copay will be for his EGD. It is not scheduled until 9/5. He mentioned this is the third time he has inquired about this.

## 2022-09-03 NOTE — Telephone Encounter (Signed)
Inbound call from patient stating Esomeprazole medication has not been helping. Requesting a call back to discuss dosage or an alternative medication. Please advise, thank you.

## 2022-09-06 NOTE — Telephone Encounter (Signed)
PT stated that the esomeprazole is not working effectively. Please advise.

## 2022-09-06 NOTE — Telephone Encounter (Addendum)
The pt has been taking nexium BID but states he continues to have reflux/heartburn. He has an EGD set with Dr Tomasa Rand in a few weeks.  We discussed anti reflux precautions and adding pepcid at bedtime.  He will keep appt for EGD and call back if the recommendations do not help.

## 2022-09-10 ENCOUNTER — Encounter (HOSPITAL_COMMUNITY): Payer: Self-pay | Admitting: Cardiology

## 2022-09-10 ENCOUNTER — Other Ambulatory Visit (HOSPITAL_COMMUNITY): Payer: Self-pay | Admitting: Cardiology

## 2022-09-10 DIAGNOSIS — R079 Chest pain, unspecified: Secondary | ICD-10-CM

## 2022-09-10 NOTE — Telephone Encounter (Signed)
PT returning call. Very upset about what he is supposed to pay for EGD. Advised someone reached out 8/8 to further discuss and he is asking that someone call him from a valid number. Please advise.

## 2022-09-24 ENCOUNTER — Encounter (HOSPITAL_COMMUNITY): Payer: Self-pay

## 2022-09-24 ENCOUNTER — Other Ambulatory Visit (HOSPITAL_COMMUNITY): Payer: Managed Care, Other (non HMO)

## 2022-09-26 ENCOUNTER — Encounter: Payer: Managed Care, Other (non HMO) | Admitting: Gastroenterology

## 2022-10-04 ENCOUNTER — Encounter: Payer: Managed Care, Other (non HMO) | Admitting: Gastroenterology

## 2022-11-19 ENCOUNTER — Other Ambulatory Visit: Payer: Self-pay | Admitting: Family Medicine

## 2022-11-19 DIAGNOSIS — K429 Umbilical hernia without obstruction or gangrene: Secondary | ICD-10-CM

## 2022-11-22 ENCOUNTER — Telehealth: Payer: Self-pay | Admitting: Gastroenterology

## 2022-11-22 MED ORDER — ESOMEPRAZOLE MAGNESIUM 40 MG PO CPDR
40.0000 mg | DELAYED_RELEASE_CAPSULE | Freq: Two times a day (BID) | ORAL | 5 refills | Status: AC
Start: 2022-11-22 — End: ?

## 2022-11-22 NOTE — Telephone Encounter (Signed)
Patient requesting refill for esomeprazole, [facility] on [address]. 2 tablets a day.

## 2022-11-22 NOTE — Telephone Encounter (Signed)
Script sent to pharmacy.

## 2022-11-26 NOTE — Telephone Encounter (Signed)
Inbound call from patient, states pharmacy states they have not received the prescription, would like it resent.

## 2022-11-27 NOTE — Telephone Encounter (Signed)
Patient informed to use a GoodRx coupon. Patient will go to [facility] and try GoodRx.

## 2022-11-27 NOTE — Telephone Encounter (Signed)
Inbound call from patient, states pharmacy advised medication was no longer covered, and it would be $300 out of pocket, patient would like to discuss generic brand or a different more affordable medications.

## 2022-12-06 ENCOUNTER — Inpatient Hospital Stay (HOSPITAL_COMMUNITY): Payer: Managed Care, Other (non HMO)

## 2022-12-06 ENCOUNTER — Emergency Department (HOSPITAL_COMMUNITY): Payer: Managed Care, Other (non HMO)

## 2022-12-06 ENCOUNTER — Inpatient Hospital Stay (HOSPITAL_COMMUNITY)
Admission: EM | Admit: 2022-12-06 | Discharge: 2022-12-19 | DRG: 286 | Disposition: A | Discharge status: Rehabilitation Facility | Payer: Managed Care, Other (non HMO) | Attending: Internal Medicine | Admitting: Internal Medicine

## 2022-12-06 ENCOUNTER — Encounter (HOSPITAL_COMMUNITY)
Admission: EM | Disposition: A | Discharge status: Rehabilitation Facility | Payer: Self-pay | Source: Home / Self Care | Attending: Pulmonary Disease

## 2022-12-06 DIAGNOSIS — E872 Acidosis, unspecified: Secondary | ICD-10-CM | POA: Diagnosis present

## 2022-12-06 DIAGNOSIS — E785 Hyperlipidemia, unspecified: Secondary | ICD-10-CM | POA: Diagnosis present

## 2022-12-06 DIAGNOSIS — J9602 Acute respiratory failure with hypercapnia: Secondary | ICD-10-CM | POA: Diagnosis present

## 2022-12-06 DIAGNOSIS — I472 Ventricular tachycardia, unspecified: Secondary | ICD-10-CM | POA: Diagnosis not present

## 2022-12-06 DIAGNOSIS — E278 Other specified disorders of adrenal gland: Secondary | ICD-10-CM | POA: Diagnosis present

## 2022-12-06 DIAGNOSIS — I5021 Acute systolic (congestive) heart failure: Secondary | ICD-10-CM | POA: Diagnosis not present

## 2022-12-06 DIAGNOSIS — G4700 Insomnia, unspecified: Secondary | ICD-10-CM | POA: Diagnosis present

## 2022-12-06 DIAGNOSIS — I10 Essential (primary) hypertension: Secondary | ICD-10-CM

## 2022-12-06 DIAGNOSIS — J9601 Acute respiratory failure with hypoxia: Secondary | ICD-10-CM | POA: Diagnosis present

## 2022-12-06 DIAGNOSIS — I429 Cardiomyopathy, unspecified: Secondary | ICD-10-CM | POA: Diagnosis present

## 2022-12-06 DIAGNOSIS — M96A3 Multiple fractures of ribs associated with chest compression and cardiopulmonary resuscitation: Secondary | ICD-10-CM | POA: Diagnosis present

## 2022-12-06 DIAGNOSIS — K5900 Constipation, unspecified: Secondary | ICD-10-CM | POA: Diagnosis not present

## 2022-12-06 DIAGNOSIS — E66812 Obesity, class 2: Secondary | ICD-10-CM

## 2022-12-06 DIAGNOSIS — I509 Heart failure, unspecified: Secondary | ICD-10-CM | POA: Diagnosis not present

## 2022-12-06 DIAGNOSIS — R5381 Other malaise: Secondary | ICD-10-CM | POA: Diagnosis not present

## 2022-12-06 DIAGNOSIS — I2584 Coronary atherosclerosis due to calcified coronary lesion: Secondary | ICD-10-CM | POA: Diagnosis present

## 2022-12-06 DIAGNOSIS — E876 Hypokalemia: Secondary | ICD-10-CM | POA: Diagnosis present

## 2022-12-06 DIAGNOSIS — I5023 Acute on chronic systolic (congestive) heart failure: Secondary | ICD-10-CM | POA: Diagnosis present

## 2022-12-06 DIAGNOSIS — I110 Hypertensive heart disease with heart failure: Principal | ICD-10-CM | POA: Diagnosis present

## 2022-12-06 DIAGNOSIS — I462 Cardiac arrest due to underlying cardiac condition: Secondary | ICD-10-CM | POA: Diagnosis present

## 2022-12-06 DIAGNOSIS — I5082 Biventricular heart failure: Secondary | ICD-10-CM | POA: Diagnosis present

## 2022-12-06 DIAGNOSIS — Z880 Allergy status to penicillin: Secondary | ICD-10-CM

## 2022-12-06 DIAGNOSIS — R4589 Other symptoms and signs involving emotional state: Secondary | ICD-10-CM | POA: Diagnosis not present

## 2022-12-06 DIAGNOSIS — E871 Hypo-osmolality and hyponatremia: Secondary | ICD-10-CM | POA: Diagnosis present

## 2022-12-06 DIAGNOSIS — I252 Old myocardial infarction: Secondary | ICD-10-CM

## 2022-12-06 DIAGNOSIS — R570 Cardiogenic shock: Secondary | ICD-10-CM | POA: Diagnosis not present

## 2022-12-06 DIAGNOSIS — D638 Anemia in other chronic diseases classified elsewhere: Secondary | ICD-10-CM | POA: Diagnosis present

## 2022-12-06 DIAGNOSIS — I2510 Atherosclerotic heart disease of native coronary artery without angina pectoris: Secondary | ICD-10-CM

## 2022-12-06 DIAGNOSIS — J810 Acute pulmonary edema: Secondary | ICD-10-CM

## 2022-12-06 DIAGNOSIS — D649 Anemia, unspecified: Secondary | ICD-10-CM

## 2022-12-06 DIAGNOSIS — I214 Non-ST elevation (NSTEMI) myocardial infarction: Secondary | ICD-10-CM | POA: Diagnosis present

## 2022-12-06 DIAGNOSIS — Z951 Presence of aortocoronary bypass graft: Secondary | ICD-10-CM | POA: Diagnosis not present

## 2022-12-06 DIAGNOSIS — Z888 Allergy status to other drugs, medicaments and biological substances status: Secondary | ICD-10-CM

## 2022-12-06 DIAGNOSIS — I469 Cardiac arrest, cause unspecified: Principal | ICD-10-CM | POA: Insufficient documentation

## 2022-12-06 DIAGNOSIS — I25118 Atherosclerotic heart disease of native coronary artery with other forms of angina pectoris: Secondary | ICD-10-CM | POA: Diagnosis not present

## 2022-12-06 DIAGNOSIS — R7303 Prediabetes: Secondary | ICD-10-CM | POA: Diagnosis not present

## 2022-12-06 DIAGNOSIS — F1090 Alcohol use, unspecified, uncomplicated: Secondary | ICD-10-CM | POA: Diagnosis present

## 2022-12-06 DIAGNOSIS — I2699 Other pulmonary embolism without acute cor pulmonale: Principal | ICD-10-CM | POA: Diagnosis present

## 2022-12-06 DIAGNOSIS — Z7982 Long term (current) use of aspirin: Secondary | ICD-10-CM

## 2022-12-06 DIAGNOSIS — Z7901 Long term (current) use of anticoagulants: Secondary | ICD-10-CM

## 2022-12-06 DIAGNOSIS — Z6841 Body Mass Index (BMI) 40.0 and over, adult: Secondary | ICD-10-CM

## 2022-12-06 DIAGNOSIS — I4891 Unspecified atrial fibrillation: Secondary | ICD-10-CM | POA: Diagnosis present

## 2022-12-06 DIAGNOSIS — I340 Nonrheumatic mitral (valve) insufficiency: Secondary | ICD-10-CM | POA: Diagnosis not present

## 2022-12-06 DIAGNOSIS — M96A1 Fracture of sternum associated with chest compression and cardiopulmonary resuscitation: Secondary | ICD-10-CM | POA: Diagnosis present

## 2022-12-06 DIAGNOSIS — Z87891 Personal history of nicotine dependence: Secondary | ICD-10-CM

## 2022-12-06 DIAGNOSIS — I2583 Coronary atherosclerosis due to lipid rich plaque: Secondary | ICD-10-CM | POA: Diagnosis not present

## 2022-12-06 DIAGNOSIS — Z79899 Other long term (current) drug therapy: Secondary | ICD-10-CM

## 2022-12-06 DIAGNOSIS — Z91030 Bee allergy status: Secondary | ICD-10-CM

## 2022-12-06 DIAGNOSIS — M7989 Other specified soft tissue disorders: Secondary | ICD-10-CM

## 2022-12-06 DIAGNOSIS — E8779 Other fluid overload: Secondary | ICD-10-CM

## 2022-12-06 DIAGNOSIS — J690 Pneumonitis due to inhalation of food and vomit: Secondary | ICD-10-CM | POA: Diagnosis not present

## 2022-12-06 DIAGNOSIS — R739 Hyperglycemia, unspecified: Secondary | ICD-10-CM | POA: Diagnosis present

## 2022-12-06 DIAGNOSIS — Z01810 Encounter for preprocedural cardiovascular examination: Secondary | ICD-10-CM | POA: Diagnosis not present

## 2022-12-06 DIAGNOSIS — E66813 Obesity, class 3: Secondary | ICD-10-CM | POA: Diagnosis present

## 2022-12-06 DIAGNOSIS — R41841 Cognitive communication deficit: Secondary | ICD-10-CM | POA: Diagnosis present

## 2022-12-06 HISTORY — DX: Essential (primary) hypertension: I10

## 2022-12-06 HISTORY — PX: RIGHT HEART CATH: CATH118263

## 2022-12-06 HISTORY — DX: Hyperlipidemia, unspecified: E78.5

## 2022-12-06 LAB — GLUCOSE, CAPILLARY
Glucose-Capillary: 201 mg/dL — ABNORMAL HIGH (ref 70–99)
Glucose-Capillary: 169 mg/dL — ABNORMAL HIGH (ref 70–99)
Glucose-Capillary: 258 mg/dL — ABNORMAL HIGH (ref 70–99)
Glucose-Capillary: 274 mg/dL — ABNORMAL HIGH (ref 70–99)
Glucose-Capillary: 229 mg/dL — ABNORMAL HIGH (ref 70–99)

## 2022-12-06 LAB — CBC
HCT: 35.8 % — ABNORMAL LOW (ref 39.0–52.0)
HCT: 33.3 % — ABNORMAL LOW (ref 39.0–52.0)
HCT: 29.2 % — ABNORMAL LOW (ref 39.0–52.0)
Hemoglobin: 11.1 g/dL — ABNORMAL LOW (ref 13.0–17.0)
Hemoglobin: 11 g/dL — ABNORMAL LOW (ref 13.0–17.0)
Hemoglobin: 10.3 g/dL — ABNORMAL LOW (ref 13.0–17.0)
MCH: 32.9 pg (ref 26.0–34.0)
MCH: 32.9 pg (ref 26.0–34.0)
MCH: 33.7 pg (ref 26.0–34.0)
MCHC: 31 g/dL (ref 30.0–36.0)
MCHC: 33 g/dL (ref 30.0–36.0)
MCHC: 35.3 g/dL (ref 30.0–36.0)
MCV: 106.2 fL — ABNORMAL HIGH (ref 80.0–100.0)
MCV: 99.7 fL (ref 80.0–100.0)
MCV: 95.4 fL (ref 80.0–100.0)
Platelets: 211 10*3/uL (ref 150–400)
Platelets: 209 10*3/uL (ref 150–400)
Platelets: 198 10*3/uL (ref 150–400)
RBC: 3.37 MIL/uL — ABNORMAL LOW (ref 4.22–5.81)
RBC: 3.34 MIL/uL — ABNORMAL LOW (ref 4.22–5.81)
RBC: 3.06 MIL/uL — ABNORMAL LOW (ref 4.22–5.81)
RDW: 12.9 % (ref 11.5–15.5)
RDW: 12.9 % (ref 11.5–15.5)
RDW: 12.7 % (ref 11.5–15.5)
WBC: 8.5 10*3/uL (ref 4.0–10.5)
WBC: 15.2 10*3/uL — ABNORMAL HIGH (ref 4.0–10.5)
WBC: 14.6 10*3/uL — ABNORMAL HIGH (ref 4.0–10.5)
nRBC: 0 % (ref 0.0–0.2)
nRBC: 0 % (ref 0.0–0.2)
nRBC: 0 % (ref 0.0–0.2)

## 2022-12-06 LAB — ECHOCARDIOGRAM COMPLETE
Height: 74 in
S' Lateral: 4.9 cm
Weight: 4444.47 [oz_av]

## 2022-12-06 LAB — COOXEMETRY PANEL
Carboxyhemoglobin: 0.7 % (ref 0.5–1.5)
Carboxyhemoglobin: 1.3 % (ref 0.5–1.5)
Carboxyhemoglobin: 0.5 % (ref 0.5–1.5)
Methemoglobin: <0.7 % (ref 0.0–1.5)
Methemoglobin: 3.2 % — ABNORMAL HIGH (ref 0.0–1.5)
Methemoglobin: <0.7 % (ref 0.0–1.5)
O2 Saturation: 61.8 %
O2 Saturation: 66.1 %
O2 Saturation: 64.9 %
Total hemoglobin: 10.6 g/dL — ABNORMAL LOW (ref 12.0–16.0)
Total hemoglobin: 11 g/dL — ABNORMAL LOW (ref 12.0–16.0)
Total hemoglobin: 11 g/dL — ABNORMAL LOW (ref 12.0–16.0)

## 2022-12-06 LAB — I-STAT ARTERIAL BLOOD GAS, ED
Acid-base deficit: 17 mmol/L — ABNORMAL HIGH (ref 0.0–2.0)
Acid-base deficit: 10 mmol/L — ABNORMAL HIGH (ref 0.0–2.0)
Bicarbonate: 16 mmol/L — ABNORMAL LOW (ref 20.0–28.0)
Bicarbonate: 17.3 mmol/L — ABNORMAL LOW (ref 20.0–28.0)
Calcium, Ion: 1.13 mmol/L — ABNORMAL LOW (ref 1.15–1.40)
Calcium, Ion: 1.1 mmol/L — ABNORMAL LOW (ref 1.15–1.40)
HCT: 40 % (ref 39.0–52.0)
HCT: 35 % — ABNORMAL LOW (ref 39.0–52.0)
Hemoglobin: 13.6 g/dL (ref 13.0–17.0)
Hemoglobin: 11.9 g/dL — ABNORMAL LOW (ref 13.0–17.0)
O2 Saturation: 84 %
O2 Saturation: 86 %
Patient temperature: 82.9
Patient temperature: 98.4
Potassium: 3.7 mmol/L (ref 3.5–5.1)
Potassium: 3.8 mmol/L (ref 3.5–5.1)
Sodium: 124 mmol/L — ABNORMAL LOW (ref 135–145)
Sodium: 125 mmol/L — ABNORMAL LOW (ref 135–145)
TCO2: 18 mmol/L — ABNORMAL LOW (ref 22–32)
TCO2: 19 mmol/L — ABNORMAL LOW (ref 22–32)
pCO2 arterial: 47.4 mmHg (ref 32–48)
pCO2 arterial: 44.1 mm[Hg] (ref 32–48)
pH, Arterial: 7.075 — CL (ref 7.35–7.45)
pH, Arterial: 7.202 — ABNORMAL LOW (ref 7.35–7.45)
pO2, Arterial: 43 mmHg — ABNORMAL LOW (ref 83–108)
pO2, Arterial: 62 mm[Hg] — ABNORMAL LOW (ref 83–108)

## 2022-12-06 LAB — POCT I-STAT EG7
Acid-base deficit: 3 mmol/L — ABNORMAL HIGH (ref 0.0–2.0)
Acid-base deficit: 3 mmol/L — ABNORMAL HIGH (ref 0.0–2.0)
Bicarbonate: 22.4 mmol/L (ref 20.0–28.0)
Bicarbonate: 22.1 mmol/L (ref 20.0–28.0)
Calcium, Ion: 1.03 mmol/L — ABNORMAL LOW (ref 1.15–1.40)
Calcium, Ion: 0.99 mmol/L — ABNORMAL LOW (ref 1.15–1.40)
HCT: 33 % — ABNORMAL LOW (ref 39.0–52.0)
HCT: 34 % — ABNORMAL LOW (ref 39.0–52.0)
Hemoglobin: 11.2 g/dL — ABNORMAL LOW (ref 13.0–17.0)
Hemoglobin: 11.6 g/dL — ABNORMAL LOW (ref 13.0–17.0)
O2 Saturation: 56 %
O2 Saturation: 58 %
Potassium: 3.5 mmol/L (ref 3.5–5.1)
Potassium: 3.4 mmol/L — ABNORMAL LOW (ref 3.5–5.1)
Sodium: 127 mmol/L — ABNORMAL LOW (ref 135–145)
Sodium: 127 mmol/L — ABNORMAL LOW (ref 135–145)
TCO2: 24 mmol/L (ref 22–32)
TCO2: 23 mmol/L (ref 22–32)
pCO2, Ven: 38.8 mm[Hg] — ABNORMAL LOW (ref 44–60)
pCO2, Ven: 38.3 mm[Hg] — ABNORMAL LOW (ref 44–60)
pH, Ven: 7.369 (ref 7.25–7.43)
pH, Ven: 7.368 (ref 7.25–7.43)
pO2, Ven: 30 mm[Hg] — CL (ref 32–45)
pO2, Ven: 31 mm[Hg] — CL (ref 32–45)

## 2022-12-06 LAB — URINALYSIS, ROUTINE W REFLEX MICROSCOPIC
Bacteria, UA: NONE SEEN
Bilirubin Urine: NEGATIVE
Glucose, UA: NEGATIVE mg/dL
Ketones, ur: NEGATIVE mg/dL
Leukocytes,Ua: NEGATIVE
Nitrite: NEGATIVE
Protein, ur: NEGATIVE mg/dL
Specific Gravity, Urine: 1.005 (ref 1.005–1.030)
pH: 5 (ref 5.0–8.0)

## 2022-12-06 LAB — POCT I-STAT 7, (LYTES, BLD GAS, ICA,H+H)
Acid-base deficit: 4 mmol/L — ABNORMAL HIGH (ref 0.0–2.0)
Acid-base deficit: 4 mmol/L — ABNORMAL HIGH (ref 0.0–2.0)
Bicarbonate: 20 mmol/L (ref 20.0–28.0)
Bicarbonate: 19.8 mmol/L — ABNORMAL LOW (ref 20.0–28.0)
Calcium, Ion: 1.02 mmol/L — ABNORMAL LOW (ref 1.15–1.40)
Calcium, Ion: 1 mmol/L — ABNORMAL LOW (ref 1.15–1.40)
HCT: 31 % — ABNORMAL LOW (ref 39.0–52.0)
HCT: 31 % — ABNORMAL LOW (ref 39.0–52.0)
Hemoglobin: 10.5 g/dL — ABNORMAL LOW (ref 13.0–17.0)
Hemoglobin: 10.5 g/dL — ABNORMAL LOW (ref 13.0–17.0)
O2 Saturation: 93 %
O2 Saturation: 98 %
Patient temperature: 37.1
Patient temperature: 37.1
Potassium: 3.3 mmol/L — ABNORMAL LOW (ref 3.5–5.1)
Potassium: 3.4 mmol/L — ABNORMAL LOW (ref 3.5–5.1)
Sodium: 128 mmol/L — ABNORMAL LOW (ref 135–145)
Sodium: 126 mmol/L — ABNORMAL LOW (ref 135–145)
TCO2: 21 mmol/L — ABNORMAL LOW (ref 22–32)
TCO2: 21 mmol/L — ABNORMAL LOW (ref 22–32)
pCO2 arterial: 32.5 mm[Hg] (ref 32–48)
pCO2 arterial: 29.3 mm[Hg] — ABNORMAL LOW (ref 32–48)
pH, Arterial: 7.397 (ref 7.35–7.45)
pH, Arterial: 7.438 (ref 7.35–7.45)
pO2, Arterial: 67 mm[Hg] — ABNORMAL LOW (ref 83–108)
pO2, Arterial: 98 mm[Hg] (ref 83–108)

## 2022-12-06 LAB — LACTIC ACID, PLASMA
Lactic Acid, Venous: 5.2 mmol/L (ref 0.5–1.9)
Lactic Acid, Venous: 3.7 mmol/L (ref 0.5–1.9)
Lactic Acid, Venous: 3.5 mmol/L (ref 0.5–1.9)
Lactic Acid, Venous: 3.5 mmol/L (ref 0.5–1.9)

## 2022-12-06 LAB — I-STAT CG4 LACTIC ACID, ED: Lactic Acid, Venous: 4.4 mmol/L (ref 0.5–1.9)

## 2022-12-06 LAB — I-STAT CHEM 8, ED
BUN: 9 mg/dL (ref 8–23)
Calcium, Ion: 0.88 mmol/L — CL (ref 1.15–1.40)
Chloride: 101 mmol/L (ref 98–111)
Creatinine, Ser: 1 mg/dL (ref 0.61–1.24)
Glucose, Bld: 227 mg/dL — ABNORMAL HIGH (ref 70–99)
HCT: 35 % — ABNORMAL LOW (ref 39.0–52.0)
Hemoglobin: 11.9 g/dL — ABNORMAL LOW (ref 13.0–17.0)
Potassium: 2.8 mmol/L — ABNORMAL LOW (ref 3.5–5.1)
Sodium: 131 mmol/L — ABNORMAL LOW (ref 135–145)
TCO2: 16 mmol/L — ABNORMAL LOW (ref 22–32)

## 2022-12-06 LAB — COMPREHENSIVE METABOLIC PANEL
ALT: 128 U/L — ABNORMAL HIGH (ref 0–44)
ALT: 115 U/L — ABNORMAL HIGH (ref 0–44)
AST: 236 U/L — ABNORMAL HIGH (ref 15–41)
AST: 201 U/L — ABNORMAL HIGH (ref 15–41)
Albumin: 2.6 g/dL — ABNORMAL LOW (ref 3.5–5.0)
Albumin: 2.6 g/dL — ABNORMAL LOW (ref 3.5–5.0)
Alkaline Phosphatase: 85 U/L (ref 38–126)
Alkaline Phosphatase: 66 U/L (ref 38–126)
Anion gap: 17 — ABNORMAL HIGH (ref 5–15)
Anion gap: 14 (ref 5–15)
BUN: 10 mg/dL (ref 8–23)
BUN: 13 mg/dL (ref 8–23)
CO2: 22 mmol/L (ref 22–32)
CO2: 20 mmol/L — ABNORMAL LOW (ref 22–32)
Calcium: 7.6 mg/dL — ABNORMAL LOW (ref 8.9–10.3)
Calcium: 7.3 mg/dL — ABNORMAL LOW (ref 8.9–10.3)
Chloride: 91 mmol/L — ABNORMAL LOW (ref 98–111)
Chloride: 95 mmol/L — ABNORMAL LOW (ref 98–111)
Creatinine, Ser: 1.38 mg/dL — ABNORMAL HIGH (ref 0.61–1.24)
Creatinine, Ser: 1.51 mg/dL — ABNORMAL HIGH (ref 0.61–1.24)
GFR, Estimated: 58 mL/min — ABNORMAL LOW (ref >60)
GFR, Estimated: 52 mL/min — ABNORMAL LOW (ref >60)
Glucose, Bld: 205 mg/dL — ABNORMAL HIGH (ref 70–99)
Glucose, Bld: 230 mg/dL — ABNORMAL HIGH (ref 70–99)
Potassium: 3.2 mmol/L — ABNORMAL LOW (ref 3.5–5.1)
Potassium: 3.2 mmol/L — ABNORMAL LOW (ref 3.5–5.1)
Sodium: 130 mmol/L — ABNORMAL LOW (ref 135–145)
Sodium: 129 mmol/L — ABNORMAL LOW (ref 135–145)
Total Bilirubin: 1 mg/dL (ref <1.2)
Total Bilirubin: 1.4 mg/dL — ABNORMAL HIGH (ref <1.2)
Total Protein: 5.1 g/dL — ABNORMAL LOW (ref 6.5–8.1)
Total Protein: 4.8 g/dL — ABNORMAL LOW (ref 6.5–8.1)

## 2022-12-06 LAB — TYPE AND SCREEN
ABO/RH(D): A POS
Antibody Screen: NEGATIVE

## 2022-12-06 LAB — I-STAT VENOUS BLOOD GAS, ED
Acid-base deficit: 15 mmol/L — ABNORMAL HIGH (ref 0.0–2.0)
Bicarbonate: 13.9 mmol/L — ABNORMAL LOW (ref 20.0–28.0)
Calcium, Ion: 0.88 mmol/L — CL (ref 1.15–1.40)
HCT: 35 % — ABNORMAL LOW (ref 39.0–52.0)
Hemoglobin: 11.9 g/dL — ABNORMAL LOW (ref 13.0–17.0)
O2 Saturation: 52 %
Potassium: 2.8 mmol/L — ABNORMAL LOW (ref 3.5–5.1)
Sodium: 131 mmol/L — ABNORMAL LOW (ref 135–145)
TCO2: 15 mmol/L — ABNORMAL LOW (ref 22–32)
pCO2, Ven: 43.6 mm[Hg] — ABNORMAL LOW (ref 44–60)
pH, Ven: 7.111 — CL (ref 7.25–7.43)
pO2, Ven: 37 mm[Hg] (ref 32–45)

## 2022-12-06 LAB — BASIC METABOLIC PANEL
Anion gap: 12 (ref 5–15)
Anion gap: 12 (ref 5–15)
BUN: 12 mg/dL (ref 8–23)
BUN: 12 mg/dL (ref 8–23)
CO2: 23 mmol/L (ref 22–32)
CO2: 24 mmol/L (ref 22–32)
Calcium: 7.3 mg/dL — ABNORMAL LOW (ref 8.9–10.3)
Calcium: 7.1 mg/dL — ABNORMAL LOW (ref 8.9–10.3)
Chloride: 93 mmol/L — ABNORMAL LOW (ref 98–111)
Chloride: 91 mmol/L — ABNORMAL LOW (ref 98–111)
Creatinine, Ser: 1.43 mg/dL — ABNORMAL HIGH (ref 0.61–1.24)
Creatinine, Ser: 1.12 mg/dL (ref 0.61–1.24)
GFR, Estimated: 55 mL/min — ABNORMAL LOW (ref >60)
GFR, Estimated: >60 mL/min (ref >60)
Glucose, Bld: 270 mg/dL — ABNORMAL HIGH (ref 70–99)
Glucose, Bld: 231 mg/dL — ABNORMAL HIGH (ref 70–99)
Potassium: 3.2 mmol/L — ABNORMAL LOW (ref 3.5–5.1)
Potassium: 3.6 mmol/L (ref 3.5–5.1)
Sodium: 128 mmol/L — ABNORMAL LOW (ref 135–145)
Sodium: 127 mmol/L — ABNORMAL LOW (ref 135–145)

## 2022-12-06 LAB — FERRITIN: Ferritin: 690 ng/mL — ABNORMAL HIGH (ref 24–336)

## 2022-12-06 LAB — HEMOGLOBIN A1C
Hgb A1c MFr Bld: 5.6 % (ref 4.8–5.6)
Mean Plasma Glucose: 114.02 mg/dL

## 2022-12-06 LAB — MAGNESIUM
Magnesium: 1.7 mg/dL (ref 1.7–2.4)
Magnesium: 1.7 mg/dL (ref 1.7–2.4)
Magnesium: 1.7 mg/dL (ref 1.7–2.4)

## 2022-12-06 LAB — PHOSPHORUS: Phosphorus: 6 mg/dL — ABNORMAL HIGH (ref 2.5–4.6)

## 2022-12-06 LAB — PROTIME-INR
INR: 1.2 (ref 0.8–1.2)
INR: 1.5 — ABNORMAL HIGH (ref 0.8–1.2)
Prothrombin Time: 15.6 s — ABNORMAL HIGH (ref 11.4–15.2)
Prothrombin Time: 18 s — ABNORMAL HIGH (ref 11.4–15.2)

## 2022-12-06 LAB — ABO/RH: ABO/RH(D): A POS

## 2022-12-06 LAB — APTT
aPTT: 29 s (ref 24–36)
aPTT: 33 s (ref 24–36)

## 2022-12-06 LAB — CBG MONITORING, ED: Glucose-Capillary: 184 mg/dL — ABNORMAL HIGH (ref 70–99)

## 2022-12-06 LAB — CG4 I-STAT (LACTIC ACID)
Lactic Acid, Venous: 4.5 mmol/L (ref 0.5–1.9)
Lactic Acid, Venous: 3.7 mmol/L (ref 0.5–1.9)

## 2022-12-06 LAB — IRON AND TIBC
Iron: 109 ug/dL (ref 45–182)
Saturation Ratios: 44 % — ABNORMAL HIGH (ref 17.9–39.5)
TIBC: 249 ug/dL — ABNORMAL LOW (ref 250–450)
UIBC: 140 ug/dL

## 2022-12-06 LAB — TSH: TSH: 6.918 u[IU]/mL — ABNORMAL HIGH (ref 0.350–4.500)

## 2022-12-06 LAB — HEPARIN LEVEL (UNFRACTIONATED)
Heparin Unfractionated: 0.15 [IU]/mL — ABNORMAL LOW (ref 0.30–0.70)
Heparin Unfractionated: 0.28 [IU]/mL — ABNORMAL LOW (ref 0.30–0.70)

## 2022-12-06 LAB — T4, FREE: Free T4: 1.29 ng/dL — ABNORMAL HIGH (ref 0.61–1.12)

## 2022-12-06 LAB — ETHANOL: Alcohol, Ethyl (B): <10 mg/dL

## 2022-12-06 LAB — TROPONIN I (HIGH SENSITIVITY)
Troponin I (High Sensitivity): 397 ng/L (ref <18)
Troponin I (High Sensitivity): 2059 ng/L (ref <18)
Troponin I (High Sensitivity): 2815 ng/L (ref <18)

## 2022-12-06 LAB — VITAMIN B12: Vitamin B-12: 521 pg/mL (ref 180–914)

## 2022-12-06 LAB — BRAIN NATRIURETIC PEPTIDE: B Natriuretic Peptide: 402.8 pg/mL — ABNORMAL HIGH (ref 0.0–100.0)

## 2022-12-06 LAB — MRSA NEXT GEN BY PCR, NASAL: MRSA by PCR Next Gen: NOT DETECTED

## 2022-12-06 LAB — FOLATE: Folate: 14.9 ng/mL (ref >5.9)

## 2022-12-06 SURGERY — RIGHT HEART CATH
Anesthesia: LOCAL

## 2022-12-06 MED ORDER — MIDAZOLAM HCL 2 MG/2ML IJ SOLN
2.0000 mg | Freq: Once | INTRAMUSCULAR | Status: AC
  Administered 2022-12-06: 2 mg via INTRAVENOUS
  Filled 2022-12-06: qty 2

## 2022-12-06 MED ORDER — PROPOFOL 1000 MG/100ML IV EMUL
5.0000 ug/kg/min | INTRAVENOUS | Status: DC
  Administered 2022-12-06: 5 ug/kg/min via INTRAVENOUS

## 2022-12-06 MED ORDER — ALBUTEROL SULFATE (2.5 MG/3ML) 0.083% IN NEBU
INHALATION_SOLUTION | RESPIRATORY_TRACT | Status: AC
  Filled 2022-12-06: qty 6

## 2022-12-06 MED ORDER — SODIUM BICARBONATE 8.4 % IV SOLN
INTRAVENOUS | Status: DC
  Filled 2022-12-06: qty 1000

## 2022-12-06 MED ORDER — EPINEPHRINE 1 MG/10ML IJ SOSY
PREFILLED_SYRINGE | INTRAMUSCULAR | Status: AC
  Filled 2022-12-06: qty 10

## 2022-12-06 MED ORDER — VASOPRESSIN 20 UNITS/100 ML INFUSION FOR SHOCK
INTRAVENOUS | Status: AC
  Administered 2022-12-06: 0.03 [IU]/min via INTRAVENOUS
  Filled 2022-12-06: qty 100

## 2022-12-06 MED ORDER — MIDAZOLAM HCL 2 MG/2ML IJ SOLN: 2.0000 mg | INTRAMUSCULAR | Status: DC | PRN

## 2022-12-06 MED ORDER — DOCUSATE SODIUM 50 MG/5ML PO LIQD
100.0000 mg | Freq: Two times a day (BID) | ORAL | Status: DC
  Administered 2022-12-06 – 2022-12-07 (×3): 100 mg
  Filled 2022-12-06 (×3): qty 10

## 2022-12-06 MED ORDER — MILRINONE LACTATE IN DEXTROSE 20-5 MG/100ML-% IV SOLN
0.1250 ug/kg/min | INTRAVENOUS | Status: DC
  Administered 2022-12-06: 0.125 ug/kg/min via INTRAVENOUS
  Filled 2022-12-06: qty 100

## 2022-12-06 MED ORDER — SODIUM BICARBONATE 8.4 % IV SOLN
50.0000 meq | Freq: Once | INTRAVENOUS | Status: AC
  Administered 2022-12-06: 50 meq via INTRAVENOUS

## 2022-12-06 MED ORDER — SODIUM BICARBONATE 8.4 % IV SOLN
INTRAVENOUS | Status: AC
  Filled 2022-12-06: qty 50

## 2022-12-06 MED ORDER — FENTANYL CITRATE PF 50 MCG/ML IJ SOSY
50.0000 ug | PREFILLED_SYRINGE | Freq: Once | INTRAMUSCULAR | Status: AC
  Administered 2022-12-06: 50 ug via INTRAVENOUS

## 2022-12-06 MED ORDER — MAGNESIUM SULFATE 4 GM/100ML IV SOLN
4.0000 g | Freq: Once | INTRAVENOUS | Status: AC
  Administered 2022-12-06: 4 g via INTRAVENOUS
  Filled 2022-12-06: qty 100

## 2022-12-06 MED ORDER — POLYETHYLENE GLYCOL 3350 17 G PO PACK
17.0000 g | PACK | Freq: Every day | ORAL | Status: DC
  Administered 2022-12-06: 17 g
  Filled 2022-12-06 (×2): qty 1

## 2022-12-06 MED ORDER — ORAL CARE MOUTH RINSE
15.0000 mL | OROMUCOSAL | Status: DC
  Administered 2022-12-06 – 2022-12-07 (×16): 15 mL via OROMUCOSAL

## 2022-12-06 MED ORDER — PERFLUTREN LIPID MICROSPHERE
1.0000 mL | INTRAVENOUS | Status: AC | PRN
Start: 2022-12-06 — End: 2022-12-06
  Administered 2022-12-06: 7 mL via INTRAVENOUS

## 2022-12-06 MED ORDER — FUROSEMIDE 10 MG/ML IJ SOLN
80.0000 mg | Freq: Once | INTRAMUSCULAR | Status: AC
  Administered 2022-12-06: 80 mg via INTRAVENOUS
  Filled 2022-12-06: qty 8

## 2022-12-06 MED ORDER — PROPOFOL 1000 MG/100ML IV EMUL: 0.0000 ug/kg/min | INTRAVENOUS | Status: DC

## 2022-12-06 MED ORDER — FENTANYL 2500MCG IN NS 250ML (10MCG/ML) PREMIX INFUSION
0.0000 ug/h | INTRAVENOUS | Status: DC
  Administered 2022-12-06: 100 ug/h via INTRAVENOUS
  Administered 2022-12-06: 50 ug/h via INTRAVENOUS
  Administered 2022-12-06: 250 ug/h via INTRAVENOUS
  Administered 2022-12-06: 300 ug/h via INTRAVENOUS
  Administered 2022-12-07: 250 ug/h via INTRAVENOUS
  Filled 2022-12-06 (×4): qty 250

## 2022-12-06 MED ORDER — FENTANYL BOLUS VIA INFUSION
50.0000 ug | INTRAVENOUS | Status: DC | PRN
  Administered 2022-12-06: 100 ug via INTRAVENOUS
  Administered 2022-12-06: 25 ug via INTRAVENOUS
  Administered 2022-12-06: 50 ug via INTRAVENOUS
  Administered 2022-12-06: 25 ug via INTRAVENOUS
  Administered 2022-12-06: 100 ug via INTRAVENOUS
  Administered 2022-12-06: 50 ug via INTRAVENOUS

## 2022-12-06 MED ORDER — POTASSIUM CHLORIDE 10 MEQ/50ML IV SOLN
10.0000 meq | INTRAVENOUS | Status: AC
  Administered 2022-12-06 (×3): 10 meq via INTRAVENOUS
  Filled 2022-12-06 (×4): qty 50

## 2022-12-06 MED ORDER — HEPARIN SODIUM (PORCINE) 1000 UNIT/ML IJ SOLN
INTRAMUSCULAR | Status: AC
  Filled 2022-12-06: qty 10

## 2022-12-06 MED ORDER — HEPARIN SODIUM (PORCINE) 5000 UNIT/ML IJ SOLN: 5000.0000 [IU] | Freq: Three times a day (TID) | INTRAMUSCULAR | Status: DC

## 2022-12-06 MED ORDER — ORAL CARE MOUTH RINSE: 15.0000 mL | OROMUCOSAL | Status: DC | PRN

## 2022-12-06 MED ORDER — POTASSIUM CHLORIDE 10 MEQ/50ML IV SOLN
10.0000 meq | INTRAVENOUS | Status: AC
  Administered 2022-12-06 – 2022-12-07 (×6): 10 meq via INTRAVENOUS
  Filled 2022-12-06 (×5): qty 50

## 2022-12-06 MED ORDER — EPINEPHRINE 1 MG/10ML IJ SOSY
PREFILLED_SYRINGE | INTRAMUSCULAR | Status: AC | PRN
  Administered 2022-12-06 (×2): 1 mg via INTRAVENOUS

## 2022-12-06 MED ORDER — POTASSIUM CHLORIDE 10 MEQ/50ML IV SOLN
10.0000 meq | INTRAVENOUS | Status: AC
  Administered 2022-12-06 (×2): 10 meq via INTRAVENOUS
  Filled 2022-12-06 (×2): qty 50

## 2022-12-06 MED ORDER — INSULIN ASPART 100 UNIT/ML IJ SOLN
0.0000 [IU] | INTRAMUSCULAR | Status: DC
  Administered 2022-12-06: 3 [IU] via SUBCUTANEOUS
  Administered 2022-12-06: 5 [IU] via SUBCUTANEOUS
  Administered 2022-12-06 (×2): 8 [IU] via SUBCUTANEOUS
  Administered 2022-12-06: 3 [IU] via SUBCUTANEOUS
  Administered 2022-12-07 – 2022-12-15 (×14): 2 [IU] via SUBCUTANEOUS

## 2022-12-06 MED ORDER — SODIUM BICARBONATE 8.4 % IV SOLN
INTRAVENOUS | Status: AC | PRN
  Administered 2022-12-06: 50 meq via INTRAVENOUS

## 2022-12-06 MED ORDER — CHLORHEXIDINE GLUCONATE CLOTH 2 % EX PADS
6.0000 | MEDICATED_PAD | Freq: Every day | CUTANEOUS | Status: DC
  Administered 2022-12-06: 6 via TOPICAL

## 2022-12-06 MED ORDER — FUROSEMIDE 10 MG/ML IJ SOLN
40.0000 mg | Freq: Once | INTRAMUSCULAR | Status: AC
  Administered 2022-12-06: 40 mg via INTRAVENOUS
  Filled 2022-12-06: qty 4

## 2022-12-06 MED ORDER — SODIUM CHLORIDE 0.9 % IV SOLN: 250.0000 mL | INTRAVENOUS | Status: DC

## 2022-12-06 MED ORDER — NOREPINEPHRINE 4 MG/250ML-% IV SOLN
0.0000 ug/min | INTRAVENOUS | Status: DC
  Administered 2022-12-06: 14 ug/min via INTRAVENOUS

## 2022-12-06 MED ORDER — ADULT MULTIVITAMIN W/MINERALS CH
1.0000 | ORAL_TABLET | Freq: Every day | ORAL | Status: DC
  Administered 2022-12-06 – 2022-12-07 (×2): 1
  Filled 2022-12-06 (×2): qty 1

## 2022-12-06 MED ORDER — DOCUSATE SODIUM 100 MG PO CAPS
100.0000 mg | ORAL_CAPSULE | Freq: Two times a day (BID) | ORAL | Status: DC | PRN
Start: 2022-12-06 — End: 2022-12-06

## 2022-12-06 MED ORDER — NOREPINEPHRINE 4 MG/250ML-% IV SOLN
0.0000 ug/min | INTRAVENOUS | Status: DC
  Administered 2022-12-06: 2 ug/min via INTRAVENOUS
  Filled 2022-12-06 (×2): qty 250

## 2022-12-06 MED ORDER — MIDAZOLAM HCL 2 MG/2ML IJ SOLN
INTRAMUSCULAR | Status: AC
  Administered 2022-12-06: 4 mg via INTRAVENOUS
  Filled 2022-12-06: qty 4

## 2022-12-06 MED ORDER — DEXMEDETOMIDINE HCL IN NACL 400 MCG/100ML IV SOLN
0.0000 ug/kg/h | INTRAVENOUS | Status: DC
  Administered 2022-12-06: 0.4 ug/kg/h via INTRAVENOUS
  Administered 2022-12-06: 0.5 ug/kg/h via INTRAVENOUS
  Administered 2022-12-06: 0.8 ug/kg/h via INTRAVENOUS
  Administered 2022-12-07: 1 ug/kg/h via INTRAVENOUS
  Administered 2022-12-07: 1.1 ug/kg/h via INTRAVENOUS
  Administered 2022-12-07: 1 ug/kg/h via INTRAVENOUS
  Filled 2022-12-06: qty 200
  Filled 2022-12-06 (×4): qty 100

## 2022-12-06 MED ORDER — CHLORHEXIDINE GLUCONATE CLOTH 2 % EX PADS
6.0000 | MEDICATED_PAD | CUTANEOUS | Status: AC
  Administered 2022-12-07: 6 via TOPICAL

## 2022-12-06 MED ORDER — MAGNESIUM SULFATE 4 GM/100ML IV SOLN
4.0000 g | Freq: Once | INTRAVENOUS | Status: AC
Start: 2022-12-06 — End: 2022-12-06
  Administered 2022-12-06: 4 g via INTRAVENOUS
  Filled 2022-12-06: qty 100

## 2022-12-06 MED ORDER — SODIUM CHLORIDE 0.9 % IV BOLUS
500.0000 mL | Freq: Once | INTRAVENOUS | Status: AC
  Administered 2022-12-06: 500 mL via INTRAVENOUS

## 2022-12-06 MED ORDER — THIAMINE MONONITRATE 100 MG PO TABS
100.0000 mg | ORAL_TABLET | Freq: Every day | ORAL | Status: DC
  Administered 2022-12-06 – 2022-12-07 (×2): 100 mg
  Filled 2022-12-06 (×2): qty 1

## 2022-12-06 MED ORDER — DOBUTAMINE-DEXTROSE 4-5 MG/ML-% IV SOLN
2.5000 ug/kg/min | INTRAVENOUS | Status: DC
  Administered 2022-12-06: 2.5 ug/kg/min via INTRAVENOUS
  Filled 2022-12-06: qty 500

## 2022-12-06 MED ORDER — POTASSIUM CHLORIDE 10 MEQ/50ML IV SOLN: 10.0000 meq | INTRAVENOUS | Status: DC

## 2022-12-06 MED ORDER — LIDOCAINE HCL (PF) 1 % IJ SOLN
INTRAMUSCULAR | Status: DC | PRN
  Administered 2022-12-06: 2 mL via INTRADERMAL

## 2022-12-06 MED ORDER — SODIUM CHLORIDE 0.9 % IV SOLN: INTRAVENOUS | Status: AC | PRN

## 2022-12-06 MED ORDER — FOLIC ACID 1 MG PO TABS
1.0000 mg | ORAL_TABLET | Freq: Every day | ORAL | Status: DC
  Administered 2022-12-06 – 2022-12-07 (×2): 1 mg
  Filled 2022-12-06 (×2): qty 1

## 2022-12-06 MED ORDER — HEPARIN (PORCINE) IN NACL 1000-0.9 UT/500ML-% IV SOLN
INTRAVENOUS | Status: DC | PRN
  Administered 2022-12-06 (×2): 500 mL

## 2022-12-06 MED ORDER — HEPARIN (PORCINE) 25000 UT/250ML-% IV SOLN
2200.0000 [IU]/h | INTRAVENOUS | Status: DC
  Administered 2022-12-06: 1400 [IU]/h via INTRAVENOUS
  Administered 2022-12-06: 1500 [IU]/h via INTRAVENOUS
  Administered 2022-12-07: 1600 [IU]/h via INTRAVENOUS
  Administered 2022-12-07: 1800 [IU]/h via INTRAVENOUS
  Administered 2022-12-08: 2000 [IU]/h via INTRAVENOUS
  Administered 2022-12-08 – 2022-12-10 (×4): 2200 [IU]/h via INTRAVENOUS
  Filled 2022-12-06 (×8): qty 250

## 2022-12-06 MED ORDER — POLYETHYLENE GLYCOL 3350 17 G PO PACK: 17.0000 g | PACK | Freq: Every day | ORAL | Status: DC | PRN

## 2022-12-06 MED ORDER — SODIUM BICARBONATE 8.4 % IV SOLN
100.0000 meq | Freq: Once | INTRAVENOUS | Status: AC
  Administered 2022-12-06: 100 meq via INTRAVENOUS
  Filled 2022-12-06: qty 50

## 2022-12-06 MED ORDER — AMIODARONE HCL IN DEXTROSE 360-4.14 MG/200ML-% IV SOLN
30.0000 mg/h | INTRAVENOUS | Status: AC
  Administered 2022-12-06 – 2022-12-07 (×4): 30 mg/h via INTRAVENOUS
  Administered 2022-12-08 – 2022-12-12 (×17): 60 mg/h via INTRAVENOUS
  Administered 2022-12-12 – 2022-12-14 (×6): 30 mg/h via INTRAVENOUS
  Filled 2022-12-06 (×3): qty 200
  Filled 2022-12-06: qty 400
  Filled 2022-12-06 (×23): qty 200

## 2022-12-06 MED ORDER — MIDAZOLAM HCL 2 MG/2ML IJ SOLN: 4.0000 mg | Freq: Once | INTRAMUSCULAR | Status: AC

## 2022-12-06 MED ORDER — FAMOTIDINE 20 MG PO TABS
20.0000 mg | ORAL_TABLET | Freq: Two times a day (BID) | ORAL | Status: DC
  Administered 2022-12-06 – 2022-12-07 (×3): 20 mg
  Filled 2022-12-06 (×3): qty 1

## 2022-12-06 MED ORDER — SODIUM BICARBONATE 8.4 % IV SOLN
INTRAVENOUS | Status: AC | PRN
  Administered 2022-12-06: 100 meq via INTRAVENOUS

## 2022-12-06 MED ORDER — TENECTEPLASE 50 MG IV KIT
50.0000 mg | PACK | INTRAVENOUS | Status: AC
  Administered 2022-12-06: 50 mg via INTRAVENOUS
  Filled 2022-12-06: qty 10

## 2022-12-06 MED ORDER — NOREPINEPHRINE 16 MG/250ML-% IV SOLN
0.0000 ug/min | INTRAVENOUS | Status: DC
  Administered 2022-12-06: 20 ug/min via INTRAVENOUS
  Administered 2022-12-07: 4 ug/min via INTRAVENOUS
  Filled 2022-12-06 (×2): qty 250

## 2022-12-06 MED ORDER — SODIUM BICARBONATE 8.4 % IV SOLN: 50.0000 meq | Freq: Once | INTRAVENOUS | Status: DC

## 2022-12-06 MED ORDER — LIDOCAINE HCL (PF) 1 % IJ SOLN
INTRAMUSCULAR | Status: AC
  Filled 2022-12-06: qty 30

## 2022-12-06 MED ORDER — AMIODARONE HCL IN DEXTROSE 360-4.14 MG/200ML-% IV SOLN
60.0000 mg/h | INTRAVENOUS | Status: AC
  Administered 2022-12-06 (×2): 60 mg/h via INTRAVENOUS
  Filled 2022-12-06: qty 400

## 2022-12-06 MED ORDER — MAGNESIUM SULFATE 2 GM/50ML IV SOLN
2.0000 g | Freq: Once | INTRAVENOUS | Status: AC
  Administered 2022-12-06: 2 g via INTRAVENOUS
  Filled 2022-12-06: qty 50

## 2022-12-06 MED ORDER — FENTANYL CITRATE PF 50 MCG/ML IJ SOSY
50.0000 ug | PREFILLED_SYRINGE | Freq: Once | INTRAMUSCULAR | Status: AC
  Administered 2022-12-06: 50 ug via INTRAVENOUS
  Filled 2022-12-06: qty 1

## 2022-12-06 MED ORDER — VASOPRESSIN 20 UNITS/100 ML INFUSION FOR SHOCK
0.0400 [IU]/min | INTRAVENOUS | Status: DC
Start: 2022-12-06 — End: 2022-12-09
  Administered 2022-12-06 – 2022-12-07 (×5): 0.04 [IU]/min via INTRAVENOUS
  Filled 2022-12-06 (×5): qty 100

## 2022-12-06 MED ORDER — EPINEPHRINE HCL 5 MG/250ML IV SOLN IN NS
0.5000 ug/min | INTRAVENOUS | Status: DC
  Administered 2022-12-06: 14 ug/min via INTRAVENOUS
  Administered 2022-12-06: 0.5 ug/min via INTRAVENOUS
  Administered 2022-12-06: 2 ug/min via INTRAVENOUS
  Filled 2022-12-06 (×2): qty 250

## 2022-12-06 MED ORDER — POLYETHYLENE GLYCOL 3350 17 G PO PACK
17.0000 g | PACK | Freq: Every day | ORAL | Status: DC | PRN
Start: 2022-12-06 — End: 2022-12-06

## 2022-12-06 MED ORDER — DOCUSATE SODIUM 100 MG PO CAPS: 100.0000 mg | ORAL_CAPSULE | Freq: Two times a day (BID) | ORAL | Status: DC | PRN

## 2022-12-06 SURGICAL SUPPLY — 11 items
CATH SWAN GANZ VIP 7.5F (CATHETERS) IMPLANT
KIT ESSENTIALS PG (KITS) ×2 IMPLANT
KIT MICROPUNCTURE NIT STIFF (SHEATH) IMPLANT
MAT PREVALON FULL STRYKER (MISCELLANEOUS) IMPLANT
PACK CARDIAC CATHETERIZATION (CUSTOM PROCEDURE TRAY) ×2 IMPLANT
SET ATX-X65L (MISCELLANEOUS) IMPLANT
SHEATH PINNACLE 8F 10CM (SHEATH) IMPLANT
SHEATH PROBE COVER 6X72 (BAG) IMPLANT
SHIELD CATH-GARD CONTAMINATION (MISCELLANEOUS) IMPLANT
TRANSDUCER W/STOPCOCK (MISCELLANEOUS) ×2 IMPLANT
WIRE MICROINTRODUCER 60CM (WIRE) IMPLANT

## 2022-12-06 NOTE — ED Triage Notes (Signed)
Pt arrived with [facility] from home alone. EMS reports initial call out CP and SOB. In route pt increasingly hypoxic. 60% on NRB. Arrives to ED on NRB. Pt transferred onto ED Stretcher. Became pale and cyanotic with subsequent respiratory arrest.   CPR was started at 244. ROSC was obtained at 306. PEA throughout ongoing CPR efforts. Total CPR time of approx 20 min.

## 2022-12-06 NOTE — ED Provider Notes (Signed)
[place] EMERGENCY DEPARTMENT AT [facility] Provider Note   CSN: [id number] Arrival date & time: 12/06/22  0243     History  Chief Complaint  Patient presents with   Respiratory Arrest    Jeremiah Dixon is a 62 y.o. male.  The history is provided by the EMS personnel.  Jeremiah Dixon is a 62 y.o. male who presents to the Emergency Department complaining of shortness of breath.  The level 5 caveat due to acuity of condition.  He presents to the emergency department by EMS for evaluation of difficulty breathing.  Per EMS he lives at home alone.  They were called out for chest pain and difficulty breathing.  He had SpO2 in the 60s for EMS, unable to tolerate CPAP due to mental status.  Unable to obtain prehospital BP, decreased radial pulses. At time of ED arrival patient unresponsive, transitioned from stretcher and had color change from pale to purple, face clenched.   Found to be pulseless and CPR was initiated.     Home Medications Prior to Admission medications   Medication Sig Start Date End Date Taking? Authorizing Provider  amLODipine (NORVASC) 10 MG tablet Take 10 mg by mouth daily. 04/06/21   [provider]  esomeprazole (NEXIUM) 40 MG capsule Take 1 capsule (40 mg total) by mouth 2 (two) times daily before a meal. 11/22/22   Zehr, Shanda Bumps D, PA-C  losartan (COZAAR) 100 MG tablet Take 100 mg by mouth daily.    [provider]  lovastatin (MEVACOR) 40 MG tablet Take 40 mg by mouth daily. 04/06/21   [provider]  metoprolol tartrate (LOPRESSOR) 50 MG tablet Take 50 mg by mouth 2 (two) times daily. 04/06/21   [provider]  nitroGLYCERIN (NITROSTAT) 0.4 MG SL tablet Place 0.4 mg under the tongue every 5 (five) minutes as needed. Patient not taking: Reported on 07/20/2022    [provider]      Allergies    Bee venom, Diclofenac, Amoxicillin, Lisinopril, and Pantoprazole    Review of Systems   Review of  Systems  Unable to perform ROS: Patient unresponsive    Physical Exam Updated Vital Signs BP (!) 131/104   Pulse 100   Temp 98.5 F (36.9 C)   Resp (!) 7   Ht 6\' 2"  (1.88 m)   Wt 126 kg   SpO2 97%   BMI 35.66 kg/m  Physical Exam Vitals and nursing note reviewed.  Constitutional:      General: He is in acute distress.     Appearance: He is well-developed. He is ill-appearing.     Comments: Unresponsive  HENT:     Head: Normocephalic and atraumatic.  Cardiovascular:     Rate and Rhythm: Regular rhythm. Tachycardia present.     Heart sounds: No murmur heard. Pulmonary:     Effort: Respiratory distress present.     Comments: Tachypnea, diffuse crackles Abdominal:     Palpations: Abdomen is soft.     Tenderness: There is no abdominal tenderness. There is no guarding or rebound.  Musculoskeletal:     Comments: Pitting edema to bilateral lower extremities.  Skin:    General: Skin is dry.     Coloration: Skin is pale.  Neurological:     Comments: GCS 1-1-1     ED Results / Procedures / Treatments   Labs (all labs ordered are listed, but only abnormal results are displayed) Labs Reviewed  PROTIME-INR - Abnormal; Notable for the following  components:      Result Value   Prothrombin Time 15.6 (*)    All other components within normal limits  CBC - Abnormal; Notable for the following components:   RBC 3.37 (*)    Hemoglobin 11.1 (*)    HCT 35.8 (*)    MCV 106.2 (*)    All other components within normal limits  I-STAT CHEM 8, ED - Abnormal; Notable for the following components:   Sodium 131 (*)    Potassium 2.8 (*)    Glucose, Bld 227 (*)    Calcium, Ion 0.88 (*)    TCO2 16 (*)    Hemoglobin 11.9 (*)    HCT 35.0 (*)    All other components within normal limits  I-STAT CG4 LACTIC ACID, ED - Abnormal; Notable for the following components:   Lactic Acid, Venous 4.4 (*)    All other components within normal limits  I-STAT VENOUS BLOOD GAS, ED - Abnormal; Notable  for the following components:   pH, Ven 7.111 (*)    pCO2, Ven 43.6 (*)    Bicarbonate 13.9 (*)    TCO2 15 (*)    Acid-base deficit 15.0 (*)    Sodium 131 (*)    Potassium 2.8 (*)    Calcium, Ion 0.88 (*)    HCT 35.0 (*)    Hemoglobin 11.9 (*)    All other components within normal limits  CBG MONITORING, ED - Abnormal; Notable for the following components:   Glucose-Capillary 184 (*)    All other components within normal limits  I-STAT ARTERIAL BLOOD GAS, ED - Abnormal; Notable for the following components:   pH, Arterial 7.075 (*)    pO2, Arterial 43 (*)    Bicarbonate 16.0 (*)    TCO2 18 (*)    Acid-base deficit 17.0 (*)    Sodium 124 (*)    Calcium, Ion 1.13 (*)    All other components within normal limits  I-STAT ARTERIAL BLOOD GAS, ED - Abnormal; Notable for the following components:   pH, Arterial 7.202 (*)    pO2, Arterial 62 (*)    Bicarbonate 17.3 (*)    TCO2 19 (*)    Acid-base deficit 10.0 (*)    Sodium 125 (*)    Calcium, Ion 1.10 (*)    HCT 35.0 (*)    Hemoglobin 11.9 (*)    All other components within normal limits  CULTURE, BLOOD (ROUTINE X 2)  CULTURE, BLOOD (ROUTINE X 2)  CULTURE, RESPIRATORY W GRAM STAIN  MRSA NEXT GEN BY PCR, NASAL  APTT  COMPREHENSIVE METABOLIC PANEL  ETHANOL  BLOOD GAS, ARTERIAL  HIV ANTIBODY (ROUTINE TESTING W REFLEX)  MAGNESIUM  BRAIN NATRIURETIC PEPTIDE  LACTIC ACID, PLASMA  LACTIC ACID, PLASMA  URINALYSIS, ROUTINE W REFLEX MICROSCOPIC  CBC  PHOSPHORUS  HEMOGLOBIN A1C  TSH  T4, FREE  VITAMIN B12  FOLATE  IRON AND TIBC  FERRITIN  BLOOD GAS, ARTERIAL  TYPE AND SCREEN  ABO/RH  TROPONIN I (HIGH SENSITIVITY)    EKG EKG Interpretation Date/Time:  Thursday December 06 2022 03:06:03 EST Ventricular Rate:  115 PR Interval:    QRS Duration:  122 QT Interval:  306 QTC Calculation: 424 R Axis:   75  Text Interpretation: Atrial fibrillation Nonspecific intraventricular conduction delay Repol abnrm, severe global  ischemia (LM/MVD) Confirmed by Tilden Fossa [phone]) on 12/06/2022 3:12:35 AM  Radiology DG Chest Portable 1 View  Result Date: 12/06/2022 CLINICAL DATA:  CPR, intubated, respiratory arrest EXAM: PORTABLE  CHEST 1 VIEW COMPARISON:  06/13/2020 FINDINGS: Endotracheal tube is 5.5 cm above the carina. Cardiomegaly. Diffuse bilateral airspace disease. No effusions or pneumothorax. No visible acute bony abnormality. IMPRESSION: Cardiomegaly. Diffuse bilateral airspace disease likely edema. Electronically Signed   By: Charlett Nose M.D.   On: 12/06/2022 03:21    Procedures Procedure Name: Intubation Date/Time: 12/06/2022 5:09 AM  Performed by: Tilden Fossa, MDPre-anesthesia Checklist: Patient identified, Patient being monitored, Emergency Drugs available, Timeout performed and Suction available Oxygen Delivery Method: Non-rebreather mask Preoxygenation: Pre-oxygenation with 100% oxygen Induction Type: Rapid sequence Ventilation: Mask ventilation with difficulty Laryngoscope Size: Glidescope Number of attempts: 1 Placement Confirmation: ETT inserted through vocal cords under direct vision, Breath sounds checked- equal and bilateral and Positive ETCO2 Tube secured with: ETT holder       CRITICAL CARE Performed by: Tilden Fossa   Total critical care time: 40 minutes  Critical care time was exclusive of separately billable procedures and treating other patients.  Critical care was necessary to treat or prevent imminent or life-threatening deterioration.  Critical care was time spent personally by me on the following activities: development of treatment plan with patient and/or surrogate as well as nursing, discussions with consultants, evaluation of patient's response to treatment, examination of patient, obtaining history from patient or surrogate, ordering and performing treatments and interventions, ordering and review of laboratory studies, ordering and review of radiographic studies,  pulse oximetry and re-evaluation of patient's condition.  Medications Ordered in ED Medications  fentaNYL in NS (88mcg/ml) infusion-PREMIX (100 mcg/hr Intravenous Rate/Dose Change 12/06/22 0343)  fentaNYL (SUBLIMAZE) bolus via infusion 50-100 mcg (has no administration in time range)  famotidine (PEPCID) tablet 20 mg (has no administration in time range)  docusate (COLACE) 50 MG/5ML liquid 100 mg (has no administration in time range)  polyethylene glycol (MIRALAX / GLYCOLAX) packet 17 g (has no administration in time range)  heparin injection 5,000 Units (has no administration in time range)  insulin aspart (novoLOG) injection 0-15 Units (has no administration in time range)  magnesium sulfate IVPB 2 g 50 mL (has no administration in time range)  sodium bicarbonate injection 100 mEq (has no administration in time range)  0.9 %  sodium chloride infusion (has no administration in time range)  norepinephrine (LEVOPHED) 4mg  in (0.016 mg/mL) premix infusion (has no administration in time range)  vasopressin 20 units/100 mL infusion SOLN (has no administration in time range)  vasopressin (PITRESSIN) 20 Units in 100 mL (0.2 unit/mL) infusion-*FOR SHOCK* (has no administration in time range)  Chlorhexidine Gluconate Cloth 2 % PADS 6 each (has no administration in time range)  Oral care mouth rinse (has no administration in time range)  Oral care mouth rinse (has no administration in time range)  EPINEPHrine (ADRENALIN) 1 MG/10ML injection (1 mg Intravenous Given 12/06/22 0251)  sodium bicarbonate injection (50 mEq Intravenous Given 12/06/22 0248)  tenecteplase (TNKASE) injection for PE/MI 50 mg (50 mg Intravenous Given 12/06/22 0258)  EPINEPHrine (ADRENALIN) 1 MG/10ML injection (1 mg Intravenous Given 12/06/22 0302)  fentaNYL (SUBLIMAZE) injection 50 mcg (50 mcg Intravenous Given 12/06/22 0315)  fentaNYL (SUBLIMAZE) injection 50 mcg (50 mcg Intravenous Given 12/06/22 0330)  furosemide  (LASIX) injection 40 mg (40 mg Intravenous Given 12/06/22 0347)  albuterol (PROVENTIL) (2.5 MG/3ML) 0.083% nebulizer solution (  Given 12/06/22 0310)  sodium bicarbonate injection (100 mEq Intravenous Given 12/06/22 0435)    ED Course/ Medical Decision Making/ A&P  Medical Decision Making Amount and/or Complexity of Data Reviewed Labs: ordered. Radiology: ordered.  Risk Prescription drug management. Decision regarding hospitalization.   Patient brought in by EMS for evaluation of chest pain and difficulty breathing, hypoxic on nonrebreather.  Patient with cardiac arrest after transition to ED stretcher.  Prehospital EKG reviewed with atrial fibrillation.  He was supported with BVM ventilation while CPR was initiated.  Please see code sheet for full details.  Patient intubated due to primary concern for hypoxic arrest.  After intubation patient continued to have sats in the 20s.  He does have rhonchi, pulmonary edema bilaterally.  Concern for possible PE driving arrest and TNK was given during CPR.  Approximately 10 minutes after administration of TNK patient with return of circulation.  Pt with approximately 25 minutes of CPR in total.  Patient began to move all extremities, follow commands.  He was started on sedation, epi drip for BP control.  Critical care consulted for admission for ongoing care.       Final Clinical Impression(s) / ED Diagnoses Final diagnoses:  Cardiac arrest (HCC)  Acute pulmonary edema [facility])    Rx / DC Orders ED Discharge Orders     None         Tilden Fossa, MD 12/06/22 0710

## 2022-12-06 NOTE — Progress Notes (Addendum)
PHARMACY - ANTICOAGULATION CONSULT NOTE  Pharmacy Consult for heparin Indication: atrial fibrillation, pulmonary embolism  Allergies  Allergen Reactions   Bee Venom Palpitations   Diclofenac Diarrhea   Amoxicillin Diarrhea and Nausea Only   Lisinopril Cough   Pantoprazole Nausea Only    Other Reaction(s): chest pain    Patient Measurements: Height: 6\' 2"  (188 cm) Weight: 126 kg (277 lb 12.5 oz) IBW/kg (Calculated) : 82.2 Heparin Dosing Weight: 110kg  Vital Signs: Temp: 98.4 F (36.9 C) (11/07 1815) BP: 99/57 (11/07 1516) Pulse Rate: 67 (11/07 1815)  Labs: Recent Labs    12/06/22 0302 12/06/22 0319 12/06/22 0548 12/06/22 0827 12/06/22 0941 12/06/22 1107 12/06/22 1214 12/06/22 1414 12/06/22 1608 12/06/22 1750  HGB 11.1*   < > 11.0*   < > 10.3*  --   --  11.6*  11.2* 10.5*  --   HCT 35.8*   < > 33.3*   < > 29.2*  --   --  34.0*  33.0* 31.0*  --   PLT 211  --  209  --  198  --   --   --   --   --   APTT 29  --   --   --  33  --   --   --   --   --   LABPROT 15.6*  --   --   --  18.0*  --   --   --   --   --   INR 1.2  --   --   --  1.5*  --   --   --   --   --   HEPARINUNFRC  --   --   --   --   --   --   --   --   --  0.15*  CREATININE  --   --  1.38*  --  1.51*  --  1.43*  --   --   --   TROPONINIHS  --   --  397*  --  2,059* 2,815*  --   --   --   --    < > = values in this interval not displayed.    Estimated Creatinine Clearance: 75.5 mL/min (A) (by C-G formula based on SCr of 1.43 mg/dL (H)).   Medical History: Past Medical History:  Diagnosis Date   MI (myocardial infarction) [facility])      Assessment: 61yo male with had PEA arrest on arrival to ED, tenecteplase was given for suspected PE. ROSC was obtained. CXR w/ pulmonary edema and EKG w/ Afib. No anticoagulation prior to admission. Pharmacy consulted for heparin for afib and possible PE.    Heparin level drawn 2.5 hours after heparin restart is 0.15 and subtherapeutic on 1500 units/hr. This is not  a steady state level. No issues with infusion or bleeding per RN. F/u plans for ECMO.    Goal of Therapy:  Heparin level 0.3-0.5 x24h after tenecteplase then 0.3-0.7 units/ml Monitor platelets by anticoagulation protocol: Yes   Plan:  Continue heparin infusion at 1500 units/hr Check 8 hour heparin level  Monitor daily heparin levels, CBC, and signs/symptoms of bleeding F/u plans for ECMO vs other mechanical support   ADDENDUM 22:40 - Heparin level closer to steady state is 0.28 just subtherapeutic on 1500 units/hr.  No issues with infusion or bleeding per RN. Increase to 1600 units/hr and f/u morning level.    Alphia Moh, PharmD, BCPS, [facility] Clinical Pharmacist  Please  check AMION for all [facility] Pharmacy phone numbers After 10:00 PM, call Main Pharmacy [phone]

## 2022-12-06 NOTE — Progress Notes (Signed)
Heart Failure Navigator Progress Note  Assessed for Heart & Vascular TOC clinic readiness.  Patient does not meet criteria due to Advanced Heart Failure Team patient of Dr. Shirlee Latch. .   Navigator will sign off at this time.   Rhae Hammock, BSN, Scientist, clinical (histocompatibility and immunogenetics) Only

## 2022-12-06 NOTE — Progress Notes (Signed)
Pt transported to Cath lab via vent with no complications and then transported back to [facility] with no complications noted.

## 2022-12-06 NOTE — TOC Initial Note (Signed)
Transition of Care [facility]) - Initial/Assessment Note    Patient Details  Name: Jeremiah Dixon MRN: [id number] Date of Birth: 1960/10/07  Transition of Care [facility]) CM/SW Contact:    Nicanor Bake Phone Number: [phone] 12/06/2022, 11:07 AM  Clinical Narrative:    HF CSW spoke with pts brother Jeremiah Dixon who stated that the pt lives alone. Pts brother stated to his knowledge his brother has no history of HH services. Pts brother stated that he does not use nay equipment and he has no scale at home. Pts brother stated that he uses public transportation to get around and he may need a bus pass at dc. Pts brother stated that he is currently working. Pts brother stated that he is concerned with pts alcohol consumption. CSW stated that a follow up PCP appt. Will be scheduled closer to dc.   TOC will continue following.              Barriers to Discharge: Continued Medical Work up   Patient Goals and CMS Choice            Expected Discharge Plan and Services       Living arrangements for the past 2 months: Apartment                                      Prior Living Arrangements/Services Living arrangements for the past 2 months: Apartment Lives with:: Self Patient language and need for interpreter reviewed:: Yes        Need for Family Participation in Patient Care: No (Comment) Care giver support system in place?: No (comment)   Criminal Activity/Legal Involvement Pertinent to Current Situation/Hospitalization: No - Comment as needed  Activities of Daily Living      Permission Sought/Granted                  Emotional Assessment Appearance:: Appears older than stated age Attitude/Demeanor/Rapport: Unable to Assess Affect (typically observed): Unable to Assess   Alcohol / Substance Use: Not Applicable Psych Involvement: No (comment)  Admission diagnosis:  Cardiac arrest [facility]) [I46.9] Patient Active Problem List   Diagnosis Date Noted    Cardiac arrest (HCC) 12/06/2022   Acute respiratory failure with hypoxia (HCC) 12/06/2022   PCP:  Sandre Kitty, PA-C Pharmacy:   [facility] [id number] - [address] [place] - [address] [facility] [address] [place] [address] Phone: [phone] Fax: [phone]  [id number] [place], [place] - [address] AT [facility] [address] [place] [address] Phone: [phone] Fax: [phone]     Social Determinants of Health (SDOH) Social History: SDOH Screenings   Food Insecurity: No Food Insecurity (12/06/2022)  Housing: Low Risk  (12/06/2022)  Transportation Needs: Unmet Transportation Needs (12/06/2022)  Utilities: Not At Risk (12/06/2022)  Alcohol Screen: Medium Risk (12/06/2022)  Financial Resource Strain: Low Risk  (12/06/2022)  Tobacco Use: Medium Risk (07/20/2022)   SDOH Interventions:     Readmission Risk Interventions     No data to display

## 2022-12-06 NOTE — Progress Notes (Signed)
PCCM attending:  This is a 62 year old gentleman admitted overnight following a sudden shortness of breath chest pain PEA cardiac arrest received TNK for presumed pulmonary embolism with 20 minutes of chest compressions and ROSC.  BP 96/70   Pulse 83   Temp 99 F (37.2 C)   Resp 20   Ht 6\' 2"  (1.88 m)   Wt 126 kg   SpO2 93%   BMI 35.66 kg/m   General: Middle-age male intubated on mechanical support awake alert following commands Neuro: Alert follows basic commands neurologically intact texting on phone Intubated on mechanical support Lungs: Bilateral ventilated breath sounds Heart: Regular rate rhythm S1-S2 distant heart tones Abdomen: Obese Extremities: Cool to the touch, edema  Labs reviewed Troponin mildly elevated, lactate 5.4, repeat pending.  Assessment: PEA cardiac arrest Unclear etiology, was hypoxemic There was concern for pulmonary embolism and he received TNK Bedside evaluation patient is cold on multiple pressors neurologically intact postarrest. Echocardiogram with RV and LV dysfunction  Plan: Multidisciplinary discussion at bedside concerning next steps. He is likely going to need mechanical support the concern right now is bleeding after receiving TNK around 3 AM. Central venous access needed Will plan to send Co. oximetry Based on repeat labs we will consider mechanical support needs.  Case discussed with ECMO team as well as Dr. Shirlee Latch.  This patient is critically ill with multiple organ system failure; which, requires frequent high complexity decision making, assessment, support, evaluation, and titration of therapies. This was completed through the application of advanced monitoring technologies and extensive interpretation of multiple databases. During this encounter critical care time was devoted to patient care services described in this note for 32 minutes.   Josephine Igo, DO [facility] 12/06/2022 10:00 AM

## 2022-12-06 NOTE — ED Notes (Signed)
Verbal order by ED Madilyn Hook for Epi drip. Drip started at this time

## 2022-12-06 NOTE — Interval H&P Note (Signed)
History and Physical Interval Note:  12/06/2022 1:39 PM  Jeremiah Dixon  has presented today for surgery, with the diagnosis of CHF.  The various methods of treatment have been discussed with the patient and family. After consideration of risks, benefits and other options for treatment, the patient has consented to  Procedure(s): ECMO CANNULATION (N/A) IABP Insertion (N/A) as a surgical intervention.  The patient's history has been reviewed, patient examined, no change in status, stable for surgery.  I have reviewed the patient's chart and labs.  Questions were answered to the patient's satisfaction.     Jeremiah Dixon

## 2022-12-06 NOTE — Progress Notes (Addendum)
eLink Physician-Brief Progress Note Patient Name: Lewie Deman DOB: January 07, 1961 MRN: [id number]   Date of Service  12/06/2022  HPI/Events of Note  62 yo M w/ pertinent PMH CAD, HTN, HLD, prediabetes presents to [facility] ED on 11/7 w/ respiratory arrest.  When the team arrived at bedside, the patient had a PEA cardiac arrest and received 20 minutes of resuscitation.tnk given at 2:57 am periarrest.  On presentation to the intensive care unit, the patient is tachypneic, hypotensive with a MAP of 47, saturating 71% on 100% FiO2.  Rapidly escalating vasopressor requirements now on norepinephrine, vasopressin, and epinephrine through peripheral IVs.  No central access at this time.  No arterial access at this time.  Results reflective of severe metabolic acidosis, hypoxemia, numerous electrolyte disturbances, hyponatremia.  Diffuse airspace disease noted on chest radiograph.  New onset A-fib.  eICU Interventions  In the setting of immediate resuscitation, administer additional bicarbonate, initiate norepinephrine, vasopressin, and maintaining epinephrine.  Holding sedation for the time being, Currently following commands-will administer Versed 4 mg.  Bilateral breath sounds present, reasonable vent pressures, no evidence of tension physiology.  Increase PEEP and respiratory rate.  Will try to obtain ABG once hemodynamically stabilized.  DVT prophylaxis with heparin (and TNK) GI prophylaxis with famotidine  Ground team to eval for central line placement   0617 -minimal urine output, Foley not draining despite being flushed.  0 cc of bladder scan.  Continue observation.  Ongoing crystalloid infusion.  8657 -elevated lactic acid at 5.2.  Repeat labs at noon.  Intervention Category Evaluation Type: New Patient Evaluation  Jammal Sarr 12/06/2022, 5:10 AM

## 2022-12-06 NOTE — Progress Notes (Signed)
PHARMACY - ANTICOAGULATION CONSULT NOTE  Pharmacy Consult for heparin Indication: atrial fibrillation  Allergies  Allergen Reactions   Bee Venom Palpitations   Diclofenac Diarrhea   Amoxicillin Diarrhea and Nausea Only   Lisinopril Cough   Pantoprazole Nausea Only    Other Reaction(s): chest pain    Patient Measurements: Height: 6\' 2"  (188 cm) Weight: 126 kg (277 lb 12.5 oz) IBW/kg (Calculated) : 82.2 Heparin Dosing Weight: 110kg  Vital Signs: Temp: 98.5 F (36.9 C) (11/07 0448) BP: 131/104 (11/07 0446) Pulse Rate: 100 (11/07 0448)  Labs: Recent Labs    12/06/22 0249 12/06/22 0302 12/06/22 0319 12/06/22 0424  HGB 11.9*  11.9* 11.1* 13.6 11.9*  HCT 35.0*  35.0* 35.8* 40.0 35.0*  PLT  --  211  --   --   APTT  --  29  --   --   LABPROT  --  15.6*  --   --   INR  --  1.2  --   --   CREATININE 1.00  --   --   --     Estimated Creatinine Clearance: 108 mL/min (by C-G formula based on SCr of 1 mg/dL).   Medical History: Past Medical History:  Diagnosis Date   MI (myocardial infarction) (HCC)     Medications:  Medications Prior to Admission  Medication Sig Dispense Refill Last Dose   amLODipine (NORVASC) 10 MG tablet Take 10 mg by mouth daily.      esomeprazole (NEXIUM) 40 MG capsule Take 1 capsule (40 mg total) by mouth 2 (two) times daily before a meal. 60 capsule 5    losartan (COZAAR) 100 MG tablet Take 100 mg by mouth daily.      lovastatin (MEVACOR) 40 MG tablet Take 40 mg by mouth daily.      metoprolol tartrate (LOPRESSOR) 50 MG tablet Take 50 mg by mouth 2 (two) times daily.      nitroGLYCERIN (NITROSTAT) 0.4 MG SL tablet Place 0.4 mg under the tongue every 5 (five) minutes as needed. (Patient not taking: Reported on 07/20/2022)      Scheduled:   Chlorhexidine Gluconate Cloth  6 each Topical Q0600   docusate  100 mg Per Tube BID   famotidine  20 mg Per Tube BID   [START ON 12/07/2022] heparin  5,000 Units Subcutaneous Q8H   insulin aspart  0-15  Units Subcutaneous Q4H   mouth rinse  15 mL Mouth Rinse Q2H   polyethylene glycol  17 g Per Tube Daily   sodium bicarbonate  100 mEq Intravenous Once   sodium bicarbonate  50 mEq Intravenous Once   Infusions:   sodium chloride     epinephrine     fentaNYL infusion INTRAVENOUS 200 mcg/hr (12/06/22 0450)   magnesium sulfate bolus IVPB     norepinephrine (LEVOPHED) Adult infusion 2 mcg/min (12/06/22 0512)   sodium bicarbonate 150 mEq in dextrose 5 % 1,150 mL infusion     vasopressin 0.03 Units/min (12/06/22 0446)    Assessment: 62yo male called EMS for CP and SOB, EMS reports O2 at 60% on NRB, on arrival to ED pt had PEA arrest and tenecteplase was given for suspected PE; ROSC was obtained and pt became alert, CXR w/ pulmonary edema, and EKG w/ Afib; now suspected CHF exacerbation that led to respiratory failure and arrest, to begin heparin for Afib (no known h/o Afib).  Tenecteplase given at 0257 and PTT was 29.  Goal of Therapy:  Heparin level 0.3-0.5 x24h after tenecteplase  then 0.3-0.7 units/ml Monitor platelets by anticoagulation protocol: Yes   Plan:  Start heparin infusion at 1400 units/hr. Monitor heparin levels and CBC.  Vernard Gambles, PharmD, BCPS  12/06/2022,5:17 AM

## 2022-12-06 NOTE — Progress Notes (Signed)
  On epi 1.5, norepi 13, and vaso 0.04. Maps improved. CO-OX 62%.   Milrinone added. Developed hypotension. Lower extremities cool.2+ edema. Able to doppler pedal pulse.   Milrinone stopped. Switch to dobutamine. Check lower extremity dopplers.   Lactic acid 5.2>3.7>4.5   Discussed with Dr Shirlee Latch.   Tonye Becket NP-C  11:49 AM'

## 2022-12-06 NOTE — Procedures (Signed)
Central Venous Catheter Insertion Procedure Note  Jeremiah Dixon  [id number]  02-Sep-1960  Date:12/06/22  Time:9:30 AM   Provider Performing:Maylyn Narvaiz Bea Laura  Cherlynn Polo   Procedure: Insertion of Non-tunneled Central Venous Catheter(36556) with US guidance (82956)   Indication(s) Medication administration  Consent Unable to obtain consent due to emergent nature of procedure. Attempts were made to contact brother, Jeremiah Dixon for consent without success. Patient relatively alert and responsive. I spoke with patient at bedside about risks and benefits, but given critical illness and intubated, will consent emergently for vasopressor support. He did nod that he was okay with proceeding.   Anesthesia Topical only with 1% lidocaine   Timeout Verified patient identification, verified procedure, site/side was marked, verified correct patient position, special equipment/implants available, medications/allergies/relevant history reviewed, required imaging and test results available.  Sterile Technique Maximal sterile technique including full sterile barrier drape, hand hygiene, sterile gown, sterile gloves, mask, hair covering, sterile ultrasound probe cover (if used).  Procedure Description Area of catheter insertion was cleaned with chlorhexidine and draped in sterile fashion.  With real-time ultrasound guidance a central venous catheter was placed into the left internal jugular vein. Nonpulsatile blood flow and easy flushing noted in all ports.  The catheter was sutured in place and sterile dressing applied.  Complications/Tolerance None; patient tolerated the procedure well. Chest X-ray is ordered to verify placement for internal jugular or subclavian cannulation.   Chest x-ray is not ordered for femoral cannulation.  EBL Minimal  Specimen(s) None   Under direct supervision of Rahul Celine Mans, PA-C

## 2022-12-06 NOTE — Progress Notes (Signed)
eLink Physician-Brief Progress Note Patient Name: Jeremiah Dixon DOB: Aug 09, 1960 MRN: [id number]   Date of Service  12/06/2022  HPI/Events of Note  62 yo M w/ pertinent PMH CAD, HTN, HLD, prediabetes presents to [facility] ED on 11/7 w/ respiratory arrest.  When the team arrived at bedside, the patient had a PEA cardiac arrest and received 20 minutes of resuscitation. S/p TNK.   potassium 3.6, mag 1.7, sodium 127, lactic acid 3.5.  Anticipate repeat Lasix dose tonight.  eICU Interventions  60 mEq of potassium 4g of magnesium Maintain evening Lasix     Intervention Category Minor Interventions: Electrolytes abnormality - evaluation and management  Yissel Habermehl 12/06/2022, 9:04 PM

## 2022-12-06 NOTE — ED Notes (Signed)
Secretary page ICU admitting provider d/t concerns for hypotension.

## 2022-12-06 NOTE — Consult Note (Addendum)
Advanced Heart Failure Team Consult Note   Primary Physician: Sandre Kitty, PA-C PCP-Cardiologist:  None  Reason for Consultation: Cardiogenic Shock   HPI:    Jeremiah Dixon is seen today for evaluation of cardiogenic shock at the request of Dr Tonia Brooms.   Patient is intubated. Therefore history has been obtained from chart review.    Jeremiah Dixon is a 62 year old with a history of CAD, HTN,  and HLD. Had cath 2007 with NSTEMI and had cath with occluded OM1.   2023 evaluated for chest pain. He had 3 ED visit for chest pain. 05/2021  EKG with T wave inversions. Ischemic recommended but he refused to stay. He had follow up with Dr Sharyn Lull and had Lexiscan- LVEF 57%, potential scar in apical inferolateral wall.   11/6 Called EMS with chest pain and shortness of breath. On arrival had PEA arrest and CPR ~ 25 minutes. Given TNK (2:56) during arrest for presumed PEA. ROSC 20 min. CXR with pulmonary edema. EKG showed A fib RVR 110-120s. Started on Heparin drip. Intubated. Awake following commands. Started on IV lasix. Started on pressors. Lactic acid 5.2>4.4   Currently on norepi 13 mcg + vaso 0.03 + epi 1.5 mcg.   Home Medications Prior to Admission medications   Medication Sig Start Date End Date Taking? Authorizing Provider  amLODipine (NORVASC) 10 MG tablet Take 10 mg by mouth daily. 04/06/21   [provider]  esomeprazole (NEXIUM) 40 MG capsule Take 1 capsule (40 mg total) by mouth 2 (two) times daily before a meal. 11/22/22   Zehr, Shanda Bumps D, PA-C  losartan (COZAAR) 100 MG tablet Take 100 mg by mouth daily.    [provider]  lovastatin (MEVACOR) 40 MG tablet Take 40 mg by mouth daily. 04/06/21   [provider]  metoprolol tartrate (LOPRESSOR) 50 MG tablet Take 50 mg by mouth 2 (two) times daily. 04/06/21   [provider]  nitroGLYCERIN (NITROSTAT) 0.4 MG SL tablet Place 0.4 mg under the tongue every 5 (five) minutes as needed. Patient not  taking: Reported on 07/20/2022    [provider]    Past Medical History: Past Medical History:  Diagnosis Date   MI (myocardial infarction) [facility])     Past Surgical History: No past surgical history on file.  Family History: No family history on file.  Social History: Social History   Socioeconomic History   Marital status: Single    Spouse name: Not on file   Number of children: Not on file   Years of education: Not on file   Highest education level: Not on file  Occupational History   Not on file  Tobacco Use   Smoking status: Former    Types: Cigarettes   Smokeless tobacco: Never  Vaping Use   Vaping status: Never Used  Substance and Sexual Activity   Alcohol use: Yes    Alcohol/week: 8.0 standard drinks of alcohol    Types: 8 Cans of beer per week   Drug use: Never   Sexual activity: Not on file  Other Topics Concern   Not on file  Social History Narrative   Not on file   Social Determinants of Health   Financial Resource Strain: Not on file  Food Insecurity: Not on file  Transportation Needs: Not on file  Physical Activity: Not on file  Stress: Not on file  Social Connections: Not on file    Allergies:  Allergies  Allergen Reactions  Bee Venom Palpitations   Diclofenac Diarrhea   Amoxicillin Diarrhea and Nausea Only   Lisinopril Cough   Pantoprazole Nausea Only    Other Reaction(s): chest pain    Objective:    Vital Signs:   Temp:  [92.7 F (33.7 C)-98.5 F (36.9 C)] 98.1 F (36.7 C) (11/07 0700) Pulse Rate:  [26-132] 119 (11/07 0700) Resp:  [7-106] 21 (11/07 0700) BP: (51-155)/(24-131) 92/73 (11/07 0700) SpO2:  [13 %-100 %] 96 % (11/07 0700) FiO2 (%):  [100 %] 100 % (11/07 0534) Weight:  [126 kg] 126 kg (11/07 0252) Last BM Date : 12/06/22  Weight change: Filed Weights   12/06/22 0252  Weight: 126 kg    Intake/Output:   Intake/Output Summary (Last 24 hours) at 12/06/2022 0844 Last data filed at 12/06/2022  0827 Gross per 24 hour  Intake 1031.87 ml  Output 190 ml  Net 841.87 ml      Physical Exam    General:  Intubated  HEENT: ETT  Neck: supple. JVP 11-12. Carotids 2+ bilat; no bruits. No lymphadenopathy or thyromegaly appreciated. Cor: PMI nondisplaced. Tachy Irregular rate & rhythm. No rubs, gallops or murmurs. Lungs: clear Abdomen: soft, nontender, nondistended. No hepatosplenomegaly. No bruits or masses. Good bowel sounds. Extremities: Cool no cyanosis, clubbing, rash, R and LLE 1-2 +edema Neuro: Intubated. Follows commands. MAE x4.   Telemetry   A fib RVR   EKG    A Fib 115  Labs   Basic Metabolic Panel: Recent Labs  Lab 12/06/22 0249 12/06/22 0319 12/06/22 0424 12/06/22 0548  NA 131*  131* 124* 125* 130*  K 2.8*  2.8* 3.7 3.8 3.2*  CL 101  --   --  91*  CO2  --   --   --  22  GLUCOSE 227*  --   --  205*  BUN 9  --   --  10  CREATININE 1.00  --   --  1.38*  CALCIUM  --   --   --  7.6*  MG  --   --   --  1.7  PHOS  --   --   --  6.0*    Liver Function Tests: Recent Labs  Lab 12/06/22 0548  AST 236*  ALT 128*  ALKPHOS 85  BILITOT 1.0  PROT 5.1*  ALBUMIN 2.6*   No results for input(s): "LIPASE", "AMYLASE" in the last 168 hours. No results for input(s): "AMMONIA" in the last 168 hours.  CBC: Recent Labs  Lab 12/06/22 0249 12/06/22 0302 12/06/22 0319 12/06/22 0424 12/06/22 0548  WBC  --  8.5  --   --  15.2*  HGB 11.9*  11.9* 11.1* 13.6 11.9* 11.0*  HCT 35.0*  35.0* 35.8* 40.0 35.0* 33.3*  MCV  --  106.2*  --   --  99.7  PLT  --  211  --   --  209    Cardiac Enzymes: No results for input(s): "CKTOTAL", "CKMB", "CKMBINDEX", "TROPONINI" in the last 168 hours.  BNP: BNP (last 3 results) Recent Labs    12/06/22 0551  BNP 402.8*    ProBNP (last 3 results) No results for input(s): "PROBNP" in the last 8760 hours.   CBG: Recent Labs  Lab 12/06/22 0306 12/06/22 0517 12/06/22 0804  GLUCAP 184* 201* 169*    Coagulation  Studies: Recent Labs    12/06/22 0302  LABPROT 15.6*  INR 1.2     Imaging   DG Chest Portable 1 View  Result Date:  12/06/2022 CLINICAL DATA:  CPR, intubated, respiratory arrest EXAM: PORTABLE CHEST 1 VIEW COMPARISON:  06/13/2020 FINDINGS: Endotracheal tube is 5.5 cm above the carina. Cardiomegaly. Diffuse bilateral airspace disease. No effusions or pneumothorax. No visible acute bony abnormality. IMPRESSION: Cardiomegaly. Diffuse bilateral airspace disease likely edema. Electronically Signed   By: Charlett Nose M.D.   On: 12/06/2022 03:21     Medications:     Current Medications:  [START ON 12/07/2022] Chlorhexidine Gluconate Cloth  6 each Topical Tomorrow-1000   docusate  100 mg Per Tube BID   famotidine  20 mg Per Tube BID   insulin aspart  0-15 Units Subcutaneous Q4H   mouth rinse  15 mL Mouth Rinse Q2H   polyethylene glycol  17 g Per Tube Daily   sodium bicarbonate        Infusions:  sodium chloride     dexmedetomidine (PRECEDEX) IV infusion     epinephrine 1.5 mcg/min (12/06/22 0827)   fentaNYL infusion INTRAVENOUS 150 mcg/hr (12/06/22 0827)   heparin 1,400 Units/hr (12/06/22 0827)   magnesium sulfate bolus IVPB 50 mL/hr at 12/06/22 0827   norepinephrine (LEVOPHED) Adult infusion 13 mcg/min (12/06/22 0827)   sodium bicarbonate 150 mEq in dextrose 5 % 1,150 mL infusion 125 mL/hr at 12/06/22 0827   vasopressin 0.03 Units/min (12/06/22 0827)      Patient Profile  Jeremiah Dixon is a 62 year old with a history of CAD, HTN,  and HLD.   Admitted PEA. Presumed PE, given TNK. ROSC ~ 25 minutes.   Assessment/Plan   1. PEA Arrest . Shock  -Suspected PE. Given TNK 0256.  - Echo severe RV failure.  -On multiple pressors. Norepi 13 mcg+ Vaso 003+ Epi 1.5 mcg.  - Possible mechanical support later today. With recent TNK continue to try and stabilize with pressors.  -Check lactic acid, HS Trop, CO-OX, type and screen.  - Central line placed.   2. Acute Respiratory  Failure -Intubated on arrival .   3. A Fib RVR -EKG 2023 -SR. On arrival A fib RVR and presumed new onset.  - Was on heparin drip. Now stopped with recent TNK  -APTT to be followed.    4. CAD -Known occlusion of OMI 2007.  -HS Trop 397 > repeat  HS Trop  -Will need eventual ischemic evaluation.    Length of Stay: 0  Tonye Becket, NP  12/06/2022, 8:44 AM  Advanced Heart Failure Team Pager [phone] (M-F; 7a - 5p)  Please contact [facility] for night-coverage after hours (4p -7a ) and weekends on [email or web address]   Patient seen with NP, agree with the above note.   History as above.  Called EMS early this morning for chest pain and dyspnea, was in PEA arrest in ER.  He had CPR x 20 minutes and tenecteplase for ?PE.  He had ROSC and was interacting/following commands.  He was intubated in ER.    He was in atrial fibrillation with RVR on arrival to ER and remains in AF.  He is currently on NE 14, epinephrine 2, vasopressin 0.04 with BP 120s/60s.  CVP 16.  Lactate at time of arrest was 5.2.  Co-ox most recently 62%.   Echo done at bedside and I reviewed, LV EF difficult to quantify but looks in 30-35% range, RV moderately dilated/moderately dysfunctional with D-shaped septum, IVC dilated.   HS-TnI 397 in ER, no STEMI on ECG.   General: Intubated, NAD.  Neck: JVP 14 cm, no thyromegaly or thyroid nodule.  Lungs:  Clear to auscultation bilaterally with normal respiratory effort. CV: Nondisplaced PMI.  Heart mildly tachy, irregular S1/S2, no S3/S4, no murmur.  1+ ankle edema.  No carotid bruit.  Normal pedal pulses.  Abdomen: Soft, nontender, no hepatosplenomegaly, no distention.  Skin: Intact without lesions or rashes.  Neurologic: Will follow commands.  Extremities: No clubbing or cyanosis.  HEENT: Normal.   1. Cardiogenic shock: S/p PEA arrest with tenecteplase this morning.  Working diagnosis was a large PE.  I reviewed echo at bedside (difficult study) with LV EF difficult to quantify  but looks in 30-35% range, RV moderately dilated/moderately dysfunctional with D-shaped septum, IVC dilated.  He is currently intubated on epinephrine 2, NE 14, vasopressin 0.04 with BP up to 120s/60s.  Lactate post-arrest was 5.2.  Co-ox now 62%.  CVP 15.  - Start milrinone 0.125 mcg/kg/min.  - Resend lactate.  - Lasix 80 mg IV x 1 now.  - We have discussed ECMO cannulation for biventricular (RV > LV) failure post ?large PE, RP Impella also option.  Seems to be more stable now.  Will follow lactate trend and take him for [facility] placement.  Concern about lytics given this morning, high bleeding risk with cannulation.  2. CAD: Known occlusion of OM1 in 2007.  HS-TnI 397 post arrest, no STEMI on ECG.  Has received tenecteplase.  - Eventually resume ASA/statin.  3. 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  Has had tenecteplase.  Will need eventual anticoagulation.  4. Atrial fibrillation: With RVR, no prior history.   - Amiodarone gtt, no bolus, for rate control.  - Eventual anticoagulation.  5. Acute hypoxemic respiratory failure: On vent, last ABG reasonable. CCM following.  6. ETOH abuse: Per brother, "1/2 gallon beer" daily.   Marca Ancona 12/06/2022 10:35 AM

## 2022-12-06 NOTE — Code Documentation (Signed)
TNK given at this time.

## 2022-12-06 NOTE — Progress Notes (Signed)
ABG ordered and obtained on ventilator settings of  VT: 650, RR: 28, FIO2: 70%, and PEEP: 12.  No further changes.  Results given to MD.      Latest Reference Range & Units 12/06/22 08:27  Sample type  ARTERIAL  pH, Arterial 7.35 - 7.45  7.397  pCO2 arterial 32 - 48 mmHg 32.5  pO2, Arterial 83 - 108 mmHg 67 (L)  TCO2 22 - 32 mmol/L 21 (L)  Acid-base deficit 0.0 - 2.0 mmol/L 4.0 (H)  Bicarbonate 20.0 - 28.0 mmol/L 20.0  O2 Saturation % 93  Patient temperature  37.1 C  Collection site  RADIAL, ALLEN'S TEST ACCEPTABLE

## 2022-12-06 NOTE — Progress Notes (Signed)
PHARMACY - ANTICOAGULATION CONSULT NOTE  Pharmacy Consult for heparin Indication: atrial fibrillation, pulmonary embolism  Allergies  Allergen Reactions   Bee Venom Palpitations   Diclofenac Diarrhea   Amoxicillin Diarrhea and Nausea Only   Lisinopril Cough   Pantoprazole Nausea Only    Other Reaction(s): chest pain    Patient Measurements: Height: 6\' 2"  (188 cm) Weight: 126 kg (277 lb 12.5 oz) IBW/kg (Calculated) : 82.2 Heparin Dosing Weight: 110kg  Vital Signs: Temp: 99.3 F (37.4 C) (11/07 1200) BP: 92/62 (11/07 1200) Pulse Rate: 84 (11/07 1200)  Labs: Recent Labs    12/06/22 0249 12/06/22 0302 12/06/22 0319 12/06/22 0548 12/06/22 0827 12/06/22 0941  HGB 11.9*  11.9* 11.1*   < > 11.0* 10.5* 10.3*  HCT 35.0*  35.0* 35.8*   < > 33.3* 31.0* 29.2*  PLT  --  211  --  209  --  198  APTT  --  29  --   --   --  33  LABPROT  --  15.6*  --   --   --  18.0*  INR  --  1.2  --   --   --  1.5*  CREATININE 1.00  --   --  1.38*  --  1.51*  TROPONINIHS  --   --   --  397*  --  2,059*   < > = values in this interval not displayed.    Estimated Creatinine Clearance: 71.5 mL/min (A) (by C-G formula based on SCr of 1.51 mg/dL (H)).   Medical History: Past Medical History:  Diagnosis Date   MI (myocardial infarction) (HCC)     Medications:  Medications Prior to Admission  Medication Sig Dispense Refill Last Dose   amLODipine (NORVASC) 10 MG tablet Take 10 mg by mouth daily.      esomeprazole (NEXIUM) 40 MG capsule Take 1 capsule (40 mg total) by mouth 2 (two) times daily before a meal. 60 capsule 5    losartan (COZAAR) 100 MG tablet Take 100 mg by mouth daily.      lovastatin (MEVACOR) 40 MG tablet Take 40 mg by mouth daily.      metoprolol tartrate (LOPRESSOR) 50 MG tablet Take 50 mg by mouth 2 (two) times daily.      nitroGLYCERIN (NITROSTAT) 0.4 MG SL tablet Place 0.4 mg under the tongue every 5 (five) minutes as needed. (Patient not taking: Reported on 07/20/2022)       Scheduled:   [START ON 12/07/2022] Chlorhexidine Gluconate Cloth  6 each Topical Tomorrow-1000   docusate  100 mg Per Tube BID   famotidine  20 mg Per Tube BID   insulin aspart  0-15 Units Subcutaneous Q4H   mouth rinse  15 mL Mouth Rinse Q2H   polyethylene glycol  17 g Per Tube Daily   Infusions:   sodium chloride     sodium chloride Stopped (12/06/22 1157)   amiodarone 60 mg/hr (12/06/22 1200)   Followed by   amiodarone     dexmedetomidine (PRECEDEX) IV infusion 0.4 mcg/kg/hr (12/06/22 1200)   DOBUTamine 2.5 mcg/kg/min (12/06/22 1200)   epinephrine 5 mcg/min (12/06/22 1200)   fentaNYL infusion INTRAVENOUS 250 mcg/hr (12/06/22 1200)   heparin Stopped (12/06/22 0865)   magnesium sulfate bolus IVPB 4 g (12/06/22 1239)   milrinone Stopped (12/06/22 1135)   norepinephrine (LEVOPHED) Adult infusion     potassium chloride 50 mL/hr at 12/06/22 1200   potassium chloride     sodium bicarbonate 150 mEq in  dextrose 5 % 1,150 mL infusion Stopped (12/06/22 0832)   vasopressin 0.04 Units/min (12/06/22 1200)    Assessment: 62yo male called EMS for CP and SOB, EMS reports O2 at 60% on NRB, on arrival to ED pt had PEA arrest and tenecteplase was given for suspected PE; ROSC was obtained and pt became alert, CXR w/ pulmonary edema, and EKG w/ Afib; now suspected CHF exacerbation that led to respiratory failure and arrest, to begin heparin for new onset Afib, not on anticoagulation prior to admission.    aPTT before thrombolysis was 29, tenecteplase given at 0257 on 11/7. Patient received heparin infusion at 1400 units/hr from 0645 to 0830, turned off in anticipation of ECMO cannulation. aPTT at 0941 was 33.   Patient is being considered for ECMO cannulation given weak RV function and poor perfusion. The team is still weighing risks and benefits of ECMO, during this time will restart heparin infusion at a slightly higher rate to achieve therapeutic levels. Will not bolus at this time.  Goal of  Therapy:  Heparin level 0.3-0.5 x24h after tenecteplase then 0.3-0.7 units/ml Monitor platelets by anticoagulation protocol: Yes   Plan:  Start heparin infusion at 1500 units/hr Check 6 hour heparin level  Monitor daily heparin levels, CBC, and signs/symptoms of bleeding F/u plans for ECMO vs other mechanical support   Ernestene Kiel, PharmD PGY1 Pharmacy Resident  Please check AMION for all [facility] phone numbers After 10:00 PM, call Main Pharmacy [phone] 12/06/2022,12:46 PM

## 2022-12-06 NOTE — Progress Notes (Signed)
Initial Nutrition Assessment  DOCUMENTATION CODES:   Not applicable  INTERVENTION:   Recommend starting enteral nutrition (trickle TF) within 24 hours if able or as soon as medically able. Recommend considering Cortrak placement.   Tube Feeding Recommendations:  Pivot 1.5 at 20 ml/hr initially with goal rate of 60 ml/hr Pro-Source TF20 60 mL QID TF at goal rate provides 2480 kcals, 215 g of protein and 1094 mL    Add MVI, Thiamine and Folic Acid in setting of reported EtOH abuse-daily (4 beers to 1/2 gallon of beers)   NUTRITION DIAGNOSIS:   Inadequate oral intake related to acute illness as evidenced by NPO status.  GOAL:   Patient will meet greater than or equal to 90% of their needs  MONITOR:   Vent status, Skin, I & O's, Labs, Weight trends  REASON FOR ASSESSMENT:   Ventilator    ASSESSMENT:   62 yo male admitted post PEA arrest with ROSC after 25 minutes, cardiogenic shock, intubated. PMH includes CAD, HTN, HLD, +EtOH abuse  Pt remains on vent support, plan for [facility] ECMO cannulation with IABP support Levophed: 17 Epinephrine: 4 Vasopressin: 0.04 Noted pt started on dobutamine  OG tube with tip/side port in stomach per chest xray report.  Abd distended/obese but somewhat soft, BS present, +gas  Pt alert and wake on vent during assessment, texting on phone and able to answer yes/no questions. Pt indicates +weight gain recently, noted edema on exam, but pt indicates he believes he gained "true weight." Pt reports he was eating "so-so" prior to admission. Pt indicates he does not smoke but does consume alcohol daily (reports 4 beers/day). Noted brother reporting pt drinks "1/2 gallon of beer" per day  Current wt: 126 kg (unclear if stated/measured).  Pt with significant edema on exam, unsure of EDW at this time.   UOP 1.67 L with diuresis  During NFPE, RD noted tongue (left sided) to be very dark purple/black, ?bruising?, painful to touch. RN notified  Labs:  sodium 127 (L), potassium 3.5 (wdl), magnesium 1.7 (wdl), phosphorus 6.0 (wdl), ionized calcium 1.03 (L), corrected calcium 8.4 (albumin 2.6), CBGs 169-274 (ICU goal 140-180) Meds: colace, miralax, lasix, KCl, Mag  NUTRITION - FOCUSED PHYSICAL EXAM:  Flowsheet Row Most Recent Value  Orbital Region No depletion  Upper Arm Region Unable to assess  Thoracic and Lumbar Region Unable to assess  Buccal Region Unable to assess  Temple Region No depletion  Clavicle Bone Region No depletion  Clavicle and Acromion Bone Region No depletion  Scapular Bone Region No depletion  Dorsal Hand Unable to assess  Patellar Region Unable to assess  Anterior Thigh Region Unable to assess  Posterior Calf Region Unable to assess  Edema (RD Assessment) Severe  [bilateral: entire legs/feet, abdomen/thoracic region, arms/hands]  Hair Reviewed  Eyes Reviewed  Mouth Other (Comment)  [left side of tongue with significant bruising: dark purple, black, painful to touch]  Skin Other (Comment)  [mottling bilteral feet]  Nails Other (Comment)  [nails: no capillary refill]       Diet Order:   Diet Order             Diet NPO time specified  Diet effective now                   EDUCATION NEEDS:   Not appropriate for education at this time  Skin:  Skin Assessment: Reviewed RN Assessment  Last BM:  11/7  Height:   Ht Readings from Last 1 Encounters:  12/06/22 6\' 2"  (1.88 m)    Weight:   Wt Readings from Last 1 Encounters:  12/06/22 126 kg    BMI:  Body mass index is 35.66 kg/m.  Estimated Nutritional Needs:   Kcal:  2400-2600 kcals  Protein:  175-215 g  Fluid:  1.8 L   Romelle Starcher MS, RDN, LDN, CNSC Registered Dietitian 3 Clinical Nutrition RD Pager and On-Call Pager Number Located in [place]

## 2022-12-06 NOTE — Procedures (Signed)
Arterial Catheter Insertion Procedure Note  Jeremiah Dixon  [id number]  [birth year]  Date:12/06/22  Time:10:06 AM    Provider Performing: Cristopher Peru    Procedure: Insertion of Arterial Line (78295) with US guidance (62130)   Indication(s) Blood pressure monitoring and/or need for frequent ABGs  Consent Unable to obtain consent due to emergent nature of procedure.  Anesthesia None   Time Out Verified patient identification, verified procedure, site/side was marked, verified correct patient position, special equipment/implants available, medications/allergies/relevant history reviewed, required imaging and test results available.   Sterile Technique Maximal sterile technique including full sterile barrier drape, hand hygiene, sterile gown, sterile gloves, mask, hair covering, sterile ultrasound probe cover (if used).   Procedure Description Area of catheter insertion was cleaned with chlorhexidine and draped in sterile fashion. With real-time ultrasound guidance an arterial catheter was placed into the right radial artery.  Appropriate arterial tracings confirmed on monitor.     Complications/Tolerance None; patient tolerated the procedure well.   EBL Minimal   Specimen(s) None   Under direct supervision of Rahul Celine Mans, PA-C

## 2022-12-06 NOTE — Progress Notes (Addendum)
NAME:  Jeremiah Dixon, MRN:  [id number], DOB:  September 07, 1960, LOS: 0 ADMISSION DATE:  12/06/2022, CONSULTATION DATE:  12/06/2022 REFERRING MD:  Dr. Madilyn Hook, EDP, CHIEF COMPLAINT:  resp arrest    History of Present Illness:  62 year old male with past medical history of CAD, HTN, HLD, prediabetes who presented to the emergency department on 12/06/22 with respiratory arrest. Per EDP, initial complaint of shortness of breath. EMS reported being called out to home for chest pain and difficulty breathing. SpO2 in 60s for EMS. When he arrived to ED, unresponsive, pulseless and CPR was initiated. He was subsequently intubated. TNK was given during arrest out of concern for PE. EDP notes approximately 25 minutes of CPR total. Was started on sedation, epi gtt and PCCM consulted for admission.   Pertinent  Medical History  CAD, HTN, HLD  Significant Hospital Events: Including procedures, antibiotic start and stop dates in addition to other pertinent events   11/7: respiratory distress, hypoxemia subsequent pulselessness and ~37min CPR until ROSC. Admitted to ICU early AM. On AM rounds, awake and alert, however appears to be in cardiogenic shock. He is cold on multiple pressors. Echo with RV/LV dysfunction. Got aline, cvc  Interim History / Subjective:  Minimally able to participate as intubated and sedated. He is calm on the ventilator, neurologically intact.   Objective   Blood pressure 92/73, pulse (!) 119, temperature 98.1 F (36.7 C), resp. rate (!) 21, height 6\' 2"  (1.88 m), weight 126 kg, SpO2 96%.    Vent Mode: PRVC FiO2 (%):  [100 %] 100 % Set Rate:  [26 bmp-28 bmp] 28 bmp Vt Set:  [650 mL] 650 mL PEEP:  [10 cmH20-12 cmH20] 12 cmH20   Intake/Output Summary (Last 24 hours) at 12/06/2022 0758 Last data filed at 12/06/2022 0700 Gross per 24 hour  Intake 187.14 ml  Output --  Net 187.14 ml   Filed Weights   12/06/22 0252  Weight: 126 kg    Examination: General: obese male, in bed,  vented, in no acute distress HENT: mucous membranes moist, anicteric sclera, ETT in place, OGT Lungs: ventilated breath sounds Cardiovascular: afib rate control ~100. No appreciated murmur, rub, gallop Abdomen: rounded, soft, non tender Extremities: cool, edema, 1+pulses Neuro: intact, follows commands, non focal exam GU: foley  Resolved Hospital Problem list     Assessment & Plan:  PEA cardiac arrest; s/p ROSC with ~25 minutes of down time. Etiology likely respiratory distress, hypoxemia and subsequent cardiovascular collapse. TNK administered @ 0256 w/ concern for pulmonary embolism. Unstable to unable to scan. He is cold on multiple pressors. Echo 11/7 with RV, LV dysfunction. BNP 400.  - CVC and A-line placed - spoke with HF team Dr. Jearld Pies and ECMO team who are evaluating for intervention, swan vs ecmo - serial troponins, f/u lactic, coox, cbc/cmp - per Dr. Jearld Pies will give 70 IV Lasix, +milrinone, amiodarone - con't levo, vaso, epi for cardiogenic shock, titrate to MAP>65 - will get type and screen if he gets cannulated will likely need blood products   Acute hypoxic respiratory failure; unclear etiology at time of arrest. He had pulmonary edema concerning for PE and was given TNK. Less likely infectious or aspiration.  - most recent ABG 7.397/32.5/67  - VAP bundle, PAD protocol - titrate FiO2 to O2 >92%, needs improvement in pO2 - H2B - wean as able. Given need for likely further intervention will hold on wean for now   Atrial fibrillation, new - con't amiodarone gtt  - con't  heparin gtt  - f/u Echo   CAD HTN HLD - holding antihypertensives, statin while critically ill. Restart when stable  Prediabetes  - SSI - CBG q4   Anemia - trend CBC  Best Practice (right click and "Reselect all SmartList Selections" daily)   Diet/type: NPO DVT prophylaxis: systemic heparin GI prophylaxis: H2B Lines: Central line, Arterial Line, and yes and it is still needed Foley:   Yes, and it is still needed Code Status:  full code Last date of multidisciplinary goals of care discussion [attempts to call brother unanswered, will try again later]  Labs   CBC: Recent Labs  Lab 12/06/22 0249 12/06/22 0302 12/06/22 0319 12/06/22 0424 12/06/22 0548  WBC  --  8.5  --   --  15.2*  HGB 11.9*  11.9* 11.1* 13.6 11.9* 11.0*  HCT 35.0*  35.0* 35.8* 40.0 35.0* 33.3*  MCV  --  106.2*  --   --  99.7  PLT  --  211  --   --  209    Basic Metabolic Panel: Recent Labs  Lab 12/06/22 0249 12/06/22 0319 12/06/22 0424 12/06/22 0548  NA 131*  131* 124* 125* 130*  K 2.8*  2.8* 3.7 3.8 3.2*  CL 101  --   --  91*  CO2  --   --   --  22  GLUCOSE 227*  --   --  205*  BUN 9  --   --  10  CREATININE 1.00  --   --  1.38*  CALCIUM  --   --   --  7.6*  MG  --   --   --  1.7  PHOS  --   --   --  6.0*   GFR: Estimated Creatinine Clearance: 78.3 mL/min (A) (by C-G formula based on SCr of 1.38 mg/dL (H)). Recent Labs  Lab 12/06/22 0249 12/06/22 0302 12/06/22 0548 12/06/22 0551  WBC  --  8.5 15.2*  --   LATICACIDVEN 4.4*  --   --  5.2*    Liver Function Tests: Recent Labs  Lab 12/06/22 0548  AST 236*  ALT 128*  ALKPHOS 85  BILITOT 1.0  PROT 5.1*  ALBUMIN 2.6*   No results for input(s): "LIPASE", "AMYLASE" in the last 168 hours. No results for input(s): "AMMONIA" in the last 168 hours.  ABG    Component Value Date/Time   PHART 7.202 (L) 12/06/2022 0424   PCO2ART 44.1 12/06/2022 0424   PO2ART 62 (L) 12/06/2022 0424   HCO3 17.3 (L) 12/06/2022 0424   TCO2 19 (L) 12/06/2022 0424   ACIDBASEDEF 10.0 (H) 12/06/2022 0424   O2SAT 86 12/06/2022 0424     Coagulation Profile: Recent Labs  Lab 12/06/22 0302  INR 1.2    Cardiac Enzymes: No results for input(s): "CKTOTAL", "CKMB", "CKMBINDEX", "TROPONINI" in the last 168 hours.  HbA1C: Hgb A1c MFr Bld  Date/Time Value Ref Range Status  12/06/2022 05:51 AM 5.6 4.8 - 5.6 % Final    Comment:    (NOTE) Pre  diabetes:          5.7%-6.4%  Diabetes:              >6.4%  Glycemic control for   <7.0% adults with diabetes   06/07/2009 09:50 PM 5.7 (H) <5.7 % Final    Comment:    See lab report for associated comment(s)    CBG: Recent Labs  Lab 12/06/22 0306 12/06/22 0517  GLUCAP 184* 201*  Review of Systems:   As above  Past Medical History:  He,  has a past medical history of MI (myocardial infarction) (HCC).   Surgical History:  No past surgical history on file.   Social History:   reports that he has quit smoking. His smoking use included cigarettes. He has never used smokeless tobacco. He reports current alcohol use of about 8.0 standard drinks of alcohol per week. He reports that he does not use drugs.   Family History:  His family history is not on file.   Allergies Allergies  Allergen Reactions   Bee Venom Palpitations   Diclofenac Diarrhea   Amoxicillin Diarrhea and Nausea Only   Lisinopril Cough   Pantoprazole Nausea Only    Other Reaction(s): chest pain     Home Medications  Prior to Admission medications   Medication Sig Start Date End Date Taking? Authorizing Provider  amLODipine (NORVASC) 10 MG tablet Take 10 mg by mouth daily. 04/06/21   [provider]  esomeprazole (NEXIUM) 40 MG capsule Take 1 capsule (40 mg total) by mouth 2 (two) times daily before a meal. 11/22/22   Zehr, Shanda Bumps D, PA-C  losartan (COZAAR) 100 MG tablet Take 100 mg by mouth daily.    [provider]  lovastatin (MEVACOR) 40 MG tablet Take 40 mg by mouth daily. 04/06/21   [provider]  metoprolol tartrate (LOPRESSOR) 50 MG tablet Take 50 mg by mouth 2 (two) times daily. 04/06/21   [provider]  nitroGLYCERIN (NITROSTAT) 0.4 MG SL tablet Place 0.4 mg under the tongue every 5 (five) minutes as needed. Patient not taking: Reported on 07/20/2022    [provider]     Critical care time: 57    Mertha Baars, PA-C [facility] 12/06/2022, 8:02 AM  Please see [email or web address] for pager details.  From 7A-7P if no response, please call [phone]. After hours, please call ELink [phone].

## 2022-12-06 NOTE — Progress Notes (Signed)
Pt transported from ED [facility] to [facility] with no complications

## 2022-12-06 NOTE — H&P (View-Only) (Signed)
Advanced Heart Failure Team Consult Note   Primary Physician: Sandre Kitty, PA-C PCP-Cardiologist:  None  Reason for Consultation: Cardiogenic Shock   HPI:    Yonael Tulloch is seen today for evaluation of cardiogenic shock at the request of Dr Tonia Brooms.   Patient is intubated. Therefore history has been obtained from chart review.    Mr Sime is a 62 year old with a history of CAD, HTN,  and HLD. Had cath 2007 with NSTEMI and had cath with occluded OM1.   2023 evaluated for chest pain. He had 3 ED visit for chest pain. 05/2021  EKG with T wave inversions. Ischemic recommended but he refused to stay. He had follow up with Dr Sharyn Lull and had Lexiscan- LVEF 57%, potential scar in apical inferolateral wall.   11/6 Called EMS with chest pain and shortness of breath. On arrival had PEA arrest and CPR ~ 25 minutes. Given TNK (2:56) during arrest for presumed PEA. ROSC 20 min. CXR with pulmonary edema. EKG showed A fib RVR 110-120s. Started on Heparin drip. Intubated. Awake following commands. Started on IV lasix. Started on pressors. Lactic acid 5.2>4.4   Currently on norepi 13 mcg + vaso 0.03 + epi 1.5 mcg.   Home Medications Prior to Admission medications   Medication Sig Start Date End Date Taking? Authorizing Provider  amLODipine (NORVASC) 10 MG tablet Take 10 mg by mouth daily. 04/06/21   [provider]  esomeprazole (NEXIUM) 40 MG capsule Take 1 capsule (40 mg total) by mouth 2 (two) times daily before a meal. 11/22/22   Zehr, Shanda Bumps D, PA-C  losartan (COZAAR) 100 MG tablet Take 100 mg by mouth daily.    [provider]  lovastatin (MEVACOR) 40 MG tablet Take 40 mg by mouth daily. 04/06/21   [provider]  metoprolol tartrate (LOPRESSOR) 50 MG tablet Take 50 mg by mouth 2 (two) times daily. 04/06/21   [provider]  nitroGLYCERIN (NITROSTAT) 0.4 MG SL tablet Place 0.4 mg under the tongue every 5 (five) minutes as needed. Patient not  taking: Reported on 07/20/2022    [provider]    Past Medical History: Past Medical History:  Diagnosis Date   MI (myocardial infarction) [facility])     Past Surgical History: No past surgical history on file.  Family History: No family history on file.  Social History: Social History   Socioeconomic History   Marital status: Single    Spouse name: Not on file   Number of children: Not on file   Years of education: Not on file   Highest education level: Not on file  Occupational History   Not on file  Tobacco Use   Smoking status: Former    Types: Cigarettes   Smokeless tobacco: Never  Vaping Use   Vaping status: Never Used  Substance and Sexual Activity   Alcohol use: Yes    Alcohol/week: 8.0 standard drinks of alcohol    Types: 8 Cans of beer per week   Drug use: Never   Sexual activity: Not on file  Other Topics Concern   Not on file  Social History Narrative   Not on file   Social Determinants of Health   Financial Resource Strain: Not on file  Food Insecurity: Not on file  Transportation Needs: Not on file  Physical Activity: Not on file  Stress: Not on file  Social Connections: Not on file    Allergies:  Allergies  Allergen Reactions  Bee Venom Palpitations   Diclofenac Diarrhea   Amoxicillin Diarrhea and Nausea Only   Lisinopril Cough   Pantoprazole Nausea Only    Other Reaction(s): chest pain    Objective:    Vital Signs:   Temp:  [92.7 F (33.7 C)-98.5 F (36.9 C)] 98.1 F (36.7 C) (11/07 0700) Pulse Rate:  [26-132] 119 (11/07 0700) Resp:  [7-106] 21 (11/07 0700) BP: (51-155)/(24-131) 92/73 (11/07 0700) SpO2:  [13 %-100 %] 96 % (11/07 0700) FiO2 (%):  [100 %] 100 % (11/07 0534) Weight:  [126 kg] 126 kg (11/07 0252) Last BM Date : 12/06/22  Weight change: Filed Weights   12/06/22 0252  Weight: 126 kg    Intake/Output:   Intake/Output Summary (Last 24 hours) at 12/06/2022 0844 Last data filed at 12/06/2022  0827 Gross per 24 hour  Intake 1031.87 ml  Output 190 ml  Net 841.87 ml      Physical Exam    General:  Intubated  HEENT: ETT  Neck: supple. JVP 11-12. Carotids 2+ bilat; no bruits. No lymphadenopathy or thyromegaly appreciated. Cor: PMI nondisplaced. Tachy Irregular rate & rhythm. No rubs, gallops or murmurs. Lungs: clear Abdomen: soft, nontender, nondistended. No hepatosplenomegaly. No bruits or masses. Good bowel sounds. Extremities: Cool no cyanosis, clubbing, rash, R and LLE 1-2 +edema Neuro: Intubated. Follows commands. MAE x4.   Telemetry   A fib RVR   EKG    A Fib 115  Labs   Basic Metabolic Panel: Recent Labs  Lab 12/06/22 0249 12/06/22 0319 12/06/22 0424 12/06/22 0548  NA 131*  131* 124* 125* 130*  K 2.8*  2.8* 3.7 3.8 3.2*  CL 101  --   --  91*  CO2  --   --   --  22  GLUCOSE 227*  --   --  205*  BUN 9  --   --  10  CREATININE 1.00  --   --  1.38*  CALCIUM  --   --   --  7.6*  MG  --   --   --  1.7  PHOS  --   --   --  6.0*    Liver Function Tests: Recent Labs  Lab 12/06/22 0548  AST 236*  ALT 128*  ALKPHOS 85  BILITOT 1.0  PROT 5.1*  ALBUMIN 2.6*   No results for input(s): "LIPASE", "AMYLASE" in the last 168 hours. No results for input(s): "AMMONIA" in the last 168 hours.  CBC: Recent Labs  Lab 12/06/22 0249 12/06/22 0302 12/06/22 0319 12/06/22 0424 12/06/22 0548  WBC  --  8.5  --   --  15.2*  HGB 11.9*  11.9* 11.1* 13.6 11.9* 11.0*  HCT 35.0*  35.0* 35.8* 40.0 35.0* 33.3*  MCV  --  106.2*  --   --  99.7  PLT  --  211  --   --  209    Cardiac Enzymes: No results for input(s): "CKTOTAL", "CKMB", "CKMBINDEX", "TROPONINI" in the last 168 hours.  BNP: BNP (last 3 results) Recent Labs    12/06/22 0551  BNP 402.8*    ProBNP (last 3 results) No results for input(s): "PROBNP" in the last 8760 hours.   CBG: Recent Labs  Lab 12/06/22 0306 12/06/22 0517 12/06/22 0804  GLUCAP 184* 201* 169*    Coagulation  Studies: Recent Labs    12/06/22 0302  LABPROT 15.6*  INR 1.2     Imaging   DG Chest Portable 1 View  Result Date:  12/06/2022 CLINICAL DATA:  CPR, intubated, respiratory arrest EXAM: PORTABLE CHEST 1 VIEW COMPARISON:  06/13/2020 FINDINGS: Endotracheal tube is 5.5 cm above the carina. Cardiomegaly. Diffuse bilateral airspace disease. No effusions or pneumothorax. No visible acute bony abnormality. IMPRESSION: Cardiomegaly. Diffuse bilateral airspace disease likely edema. Electronically Signed   By: Charlett Nose M.D.   On: 12/06/2022 03:21     Medications:     Current Medications:  [START ON 12/07/2022] Chlorhexidine Gluconate Cloth  6 each Topical Tomorrow-1000   docusate  100 mg Per Tube BID   famotidine  20 mg Per Tube BID   insulin aspart  0-15 Units Subcutaneous Q4H   mouth rinse  15 mL Mouth Rinse Q2H   polyethylene glycol  17 g Per Tube Daily   sodium bicarbonate        Infusions:  sodium chloride     dexmedetomidine (PRECEDEX) IV infusion     epinephrine 1.5 mcg/min (12/06/22 0827)   fentaNYL infusion INTRAVENOUS 150 mcg/hr (12/06/22 0827)   heparin 1,400 Units/hr (12/06/22 0827)   magnesium sulfate bolus IVPB 50 mL/hr at 12/06/22 0827   norepinephrine (LEVOPHED) Adult infusion 13 mcg/min (12/06/22 0827)   sodium bicarbonate 150 mEq in dextrose 5 % 1,150 mL infusion 125 mL/hr at 12/06/22 0827   vasopressin 0.03 Units/min (12/06/22 0827)      Patient Profile  Mr Woody is a 62 year old with a history of CAD, HTN,  and HLD.   Admitted PEA. Presumed PE, given TNK. ROSC ~ 25 minutes.   Assessment/Plan   1. PEA Arrest . Shock  -Suspected PE. Given TNK 0256.  - Echo severe RV failure.  -On multiple pressors. Norepi 13 mcg+ Vaso 003+ Epi 1.5 mcg.  - Possible mechanical support later today. With recent TNK continue to try and stabilize with pressors.  -Check lactic acid, HS Trop, CO-OX, type and screen.  - Central line placed.   2. Acute Respiratory  Failure -Intubated on arrival .   3. A Fib RVR -EKG 2023 -SR. On arrival A fib RVR and presumed new onset.  - Was on heparin drip. Now stopped with recent TNK  -APTT to be followed.    4. CAD -Known occlusion of OMI 2007.  -HS Trop 397 > repeat  HS Trop  -Will need eventual ischemic evaluation.    Length of Stay: 0  Tonye Becket, NP  12/06/2022, 8:44 AM  Advanced Heart Failure Team Pager [phone] (M-F; 7a - 5p)  Please contact [facility] for night-coverage after hours (4p -7a ) and weekends on [email or web address]   Patient seen with NP, agree with the above note.   History as above.  Called EMS early this morning for chest pain and dyspnea, was in PEA arrest in ER.  He had CPR x 20 minutes and tenecteplase for ?PE.  He had ROSC and was interacting/following commands.  He was intubated in ER.    He was in atrial fibrillation with RVR on arrival to ER and remains in AF.  He is currently on NE 14, epinephrine 2, vasopressin 0.04 with BP 120s/60s.  CVP 16.  Lactate at time of arrest was 5.2.  Co-ox most recently 62%.   Echo done at bedside and I reviewed, LV EF difficult to quantify but looks in 30-35% range, RV moderately dilated/moderately dysfunctional with D-shaped septum, IVC dilated.   HS-TnI 397 in ER, no STEMI on ECG.   General: Intubated, NAD.  Neck: JVP 14 cm, no thyromegaly or thyroid nodule.  Lungs:  Clear to auscultation bilaterally with normal respiratory effort. CV: Nondisplaced PMI.  Heart mildly tachy, irregular S1/S2, no S3/S4, no murmur.  1+ ankle edema.  No carotid bruit.  Normal pedal pulses.  Abdomen: Soft, nontender, no hepatosplenomegaly, no distention.  Skin: Intact without lesions or rashes.  Neurologic: Will follow commands.  Extremities: No clubbing or cyanosis.  HEENT: Normal.   1. Cardiogenic shock: S/p PEA arrest with tenecteplase this morning.  Working diagnosis was a large PE.  I reviewed echo at bedside (difficult study) with LV EF difficult to quantify  but looks in 30-35% range, RV moderately dilated/moderately dysfunctional with D-shaped septum, IVC dilated.  He is currently intubated on epinephrine 2, NE 14, vasopressin 0.04 with BP up to 120s/60s.  Lactate post-arrest was 5.2.  Co-ox now 62%.  CVP 15.  - Start milrinone 0.125 mcg/kg/min.  - Resend lactate.  - Lasix 80 mg IV x 1 now.  - We have discussed ECMO cannulation for biventricular (RV > LV) failure post ?large PE, RP Impella also option.  Seems to be more stable now.  Will follow lactate trend and take [facility] for [facility] placement.  Concern about lytics given this morning, high bleeding risk with cannulation.  2. CAD: Known occlusion of OM1 in 2007.  HS-TnI 397 post arrest, no STEMI on ECG.  Has received tenecteplase.  - Eventually resume ASA/statin.  3. 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  Has had tenecteplase.  Will need eventual anticoagulation.  4. Atrial fibrillation: With RVR, no prior history.   - Amiodarone gtt, no bolus, for rate control.  - Eventual anticoagulation.  5. Acute hypoxemic respiratory failure: On vent, last ABG reasonable. CCM following.  6. ETOH abuse: Per brother, "1/2 gallon beer" daily.   Marca Ancona 12/06/2022 10:35 AM

## 2022-12-06 NOTE — H&P (Addendum)
NAME:  Jeremiah Dixon, MRN:  [id number], DOB:  [birth year], LOS: 0 ADMISSION DATE:  12/06/2022, CONSULTATION DATE:  11/7 REFERRING MD:  Dr. Madilyn Hook, CHIEF COMPLAINT:  respiratory arrest   History of Present Illness:  Patient is a 62 yo M w/ pertinent PMH CAD, HTN, HLD, prediabetes presents to [facility] ED on 11/7 w/ respiratory arrest.  On 11/7 patient called EMS w/ chest pain and sob. On arrival patient pale and sats 60% on NRB. Transferred to [facility] ED. On arrival patient PEA arrest and cpr started. Patient intubated. Received tnk for concern for possible PE. ROSC 20 minutes. CXR w/ pulmonary edema. Patient alert and following commands. EKG w/ afib rate 130s. Labs pending. PCCM consulted for icu admission.   Pertinent  Medical History   CAD HTN HLD   Significant Hospital Events: Including procedures, antibiotic start and stop dates in addition to other pertinent events   11/7 respiratory distress w/ hypoxemia; on arrival to [facility] ed became pulseless; 20 minutes until ROSC  Interim History / Subjective:  Intubated on 100% fio2 and 10 peep Awake/following commands  Objective   Blood pressure (!) 109/29, pulse (!) 130, resp. rate (!) 21, height 6\' 2"  (1.88 m), weight 126 kg, SpO2 (!) 39%.       No intake or output data in the 24 hours ending 12/06/22 0332 Filed Weights   12/06/22 0252  Weight: 126 kg    Examination: General:  critically ill appearing on mech vent HEENT: MM pink/moist; ETT in place Neuro: on propofol; Awake and following commands; perrl CV: s1s2, irregular tachy 130s, no m/r/g PULM:  dim clear BS bilaterally; on mech vent PRVC GI: soft, bsx4 active  Extremities: warm/dry, significant BLE edema  Skin: no rashes or lesions    Resolved Hospital Problem list     Assessment & Plan:  Cardiac arrest: en route to [facility] patient in resp distress and hypoxic w/ sats 60%. Patient coded on arrival to [facility] and ROSC 20 minutes.  Plan: -will admit to ICU w/ continuous  telemetry monitoring -trend troponin and lactate -RR increased; repeat ABG in few hours -check CMP, Mag, BNP -cultures and ua obtained -check procalcitonin -start on zosyn -CXR w/ pulm edema; will give lasix; monitor UOP -echo -tnk given at 2:57 am; when to start heparin per pharmacy  Acute respiratory failure w/ hypoxia Likely chf exacerbation w/ pulmonary edema Plan: -LTVV strategy with tidal volumes of 6-8 cc/kg ideal body weight -RR increased; repeat ABG in 2 hours and adjust settings accordingly -Wean PEEP/FiO2 for SpO2 >92% -VAP bundle in place -Daily SAT and SBT -PAD protocol in place -wean sedation for RASS goal 0 to -1 -lasix as above  New onset Afib Plan: -tele monitoring -when to start [facility] per pharmacy post TNK -hold rate control w/ soft BP  CAD HTN HLD Plan: -hold home anti-hypertensives -consider resuming statin in am  Prediabetes Hyperglycemia Plan: -A1c -ssi and cbg monitoring  Anemia Plan: -trend cbc   Best Practice (right click and "Reselect all SmartList Selections" daily)   Diet/type: NPO w/ meds via tube DVT prophylaxis: SCD; systemic heparin per pharmacy post tnk GI prophylaxis: H2B Lines: N/A Foley:  Yes, and it is still needed Code Status:  full code Last date of multidisciplinary goals of care discussion [11/7 attempted to contact brother, bill Nolt, over phone but no answer]  Labs   CBC: Recent Labs  Lab 12/06/22 0249 12/06/22 0302 12/06/22 0319  WBC  --  8.5  --  HGB 11.9*  11.9* 11.1* 13.6  HCT 35.0*  35.0* 35.8* 40.0  MCV  --  106.2*  --   PLT  --  211  --     Basic Metabolic Panel: Recent Labs  Lab 12/06/22 0249 12/06/22 0319  NA 131*  131* 124*  K 2.8*  2.8* 3.7  CL 101  --   GLUCOSE 227*  --   BUN 9  --   CREATININE 1.00  --    GFR: Estimated Creatinine Clearance: 108 mL/min (by C-G formula based on SCr of 1 mg/dL). Recent Labs  Lab 12/06/22 0249 12/06/22 0302  WBC  --  8.5  LATICACIDVEN  4.4*  --     Liver Function Tests: No results for input(s): "AST", "ALT", "ALKPHOS", "BILITOT", "PROT", "ALBUMIN" in the last 168 hours. No results for input(s): "LIPASE", "AMYLASE" in the last 168 hours. No results for input(s): "AMMONIA" in the last 168 hours.  ABG    Component Value Date/Time   PHART 7.075 (LL) 12/06/2022 0319   PCO2ART 47.4 12/06/2022 0319   PO2ART 43 (L) 12/06/2022 0319   HCO3 16.0 (L) 12/06/2022 0319   TCO2 18 (L) 12/06/2022 0319   ACIDBASEDEF 17.0 (H) 12/06/2022 0319   O2SAT 84 12/06/2022 0319     Coagulation Profile: Recent Labs  Lab 12/06/22 0302  INR 1.2    Cardiac Enzymes: No results for input(s): "CKTOTAL", "CKMB", "CKMBINDEX", "TROPONINI" in the last 168 hours.  HbA1C: Hgb A1c MFr Bld  Date/Time Value Ref Range Status  06/07/2009 09:50 PM 5.7 (H) <5.7 % Final    Comment:    See lab report for associated comment(s)    CBG: Recent Labs  Lab 12/06/22 0306  GLUCAP 184*    Review of Systems:   Patient is intubated; therefore, history has been obtained from chart review.    Past Medical History:  He,  has a past medical history of MI (myocardial infarction) (HCC).   Surgical History:  No past surgical history on file.   Social History:   reports that he has quit smoking. His smoking use included cigarettes. He has never used smokeless tobacco. He reports current alcohol use of about 8.0 standard drinks of alcohol per week. He reports that he does not use drugs.   Family History:  His family history is not on file.   Allergies Allergies  Allergen Reactions   Bee Venom Palpitations   Diclofenac Diarrhea   Amoxicillin Diarrhea and Nausea Only   Lisinopril Cough   Pantoprazole Nausea Only    Other Reaction(s): chest pain     Home Medications  Prior to Admission medications   Medication Sig Start Date End Date Taking? Authorizing Provider  amLODipine (NORVASC) 10 MG tablet Take 10 mg by mouth daily. 04/06/21   [provider]  esomeprazole (NEXIUM) 40 MG capsule Take 1 capsule (40 mg total) by mouth 2 (two) times daily before a meal. 11/22/22   Zehr, Shanda Bumps D, PA-C  losartan (COZAAR) 100 MG tablet Take 100 mg by mouth daily.    [provider]  lovastatin (MEVACOR) 40 MG tablet Take 40 mg by mouth daily. 04/06/21   [provider]  metoprolol tartrate (LOPRESSOR) 50 MG tablet Take 50 mg by mouth 2 (two) times daily. 04/06/21   [provider]  nitroGLYCERIN (NITROSTAT) 0.4 MG SL tablet Place 0.4 mg under the tongue every 5 (five) minutes as needed. Patient not taking: Reported on 07/20/2022    [provider]  Critical care time: 45 minutes     JD Anselm Lis [facility] 12/06/2022, 3:32 AM  Please see [email or web address] for pager details.  From 7A-7P if no response, please call [phone]. After hours, please call ELink [phone].

## 2022-12-06 NOTE — ED Notes (Signed)
Pending orders for ongoing epi drip currently at 2 mcg/hr

## 2022-12-07 ENCOUNTER — Inpatient Hospital Stay (HOSPITAL_COMMUNITY): Payer: Managed Care, Other (non HMO)

## 2022-12-07 ENCOUNTER — Encounter (HOSPITAL_COMMUNITY): Payer: Self-pay | Admitting: Cardiology

## 2022-12-07 DIAGNOSIS — R570 Cardiogenic shock: Secondary | ICD-10-CM | POA: Insufficient documentation

## 2022-12-07 DIAGNOSIS — E8779 Other fluid overload: Secondary | ICD-10-CM | POA: Insufficient documentation

## 2022-12-07 DIAGNOSIS — I4891 Unspecified atrial fibrillation: Secondary | ICD-10-CM | POA: Diagnosis not present

## 2022-12-07 DIAGNOSIS — J9601 Acute respiratory failure with hypoxia: Secondary | ICD-10-CM | POA: Diagnosis not present

## 2022-12-07 DIAGNOSIS — I469 Cardiac arrest, cause unspecified: Secondary | ICD-10-CM | POA: Diagnosis not present

## 2022-12-07 LAB — BASIC METABOLIC PANEL
Anion gap: 11 (ref 5–15)
Anion gap: 8 (ref 5–15)
BUN: 9 mg/dL (ref 8–23)
BUN: 11 mg/dL (ref 8–23)
CO2: 23 mmol/L (ref 22–32)
CO2: 24 mmol/L (ref 22–32)
Calcium: 7.2 mg/dL — ABNORMAL LOW (ref 8.9–10.3)
Calcium: 7.7 mg/dL — ABNORMAL LOW (ref 8.9–10.3)
Chloride: 93 mmol/L — ABNORMAL LOW (ref 98–111)
Chloride: 96 mmol/L — ABNORMAL LOW (ref 98–111)
Creatinine, Ser: 0.91 mg/dL (ref 0.61–1.24)
Creatinine, Ser: 1.02 mg/dL (ref 0.61–1.24)
GFR, Estimated: >60 mL/min (ref >60)
GFR, Estimated: >60 mL/min (ref >60)
Glucose, Bld: 142 mg/dL — ABNORMAL HIGH (ref 70–99)
Glucose, Bld: 118 mg/dL — ABNORMAL HIGH (ref 70–99)
Potassium: 3.7 mmol/L (ref 3.5–5.1)
Potassium: 4.7 mmol/L (ref 3.5–5.1)
Sodium: 127 mmol/L — ABNORMAL LOW (ref 135–145)
Sodium: 128 mmol/L — ABNORMAL LOW (ref 135–145)

## 2022-12-07 LAB — CBC
HCT: 27 % — ABNORMAL LOW (ref 39.0–52.0)
Hemoglobin: 9.5 g/dL — ABNORMAL LOW (ref 13.0–17.0)
MCH: 31.7 pg (ref 26.0–34.0)
MCHC: 35.2 g/dL (ref 30.0–36.0)
MCV: 90 fL (ref 80.0–100.0)
Platelets: 157 10*3/uL (ref 150–400)
RBC: 3 MIL/uL — ABNORMAL LOW (ref 4.22–5.81)
RDW: 12.5 % (ref 11.5–15.5)
WBC: 6.9 10*3/uL (ref 4.0–10.5)
nRBC: 0 % (ref 0.0–0.2)

## 2022-12-07 LAB — COOXEMETRY PANEL
Carboxyhemoglobin: 1.4 % (ref 0.5–1.5)
Methemoglobin: <0.7 % (ref 0.0–1.5)
O2 Saturation: 60.8 %
Total hemoglobin: 9.6 g/dL — ABNORMAL LOW (ref 12.0–16.0)

## 2022-12-07 LAB — MAGNESIUM: Magnesium: 2 mg/dL (ref 1.7–2.4)

## 2022-12-07 LAB — GLUCOSE, CAPILLARY
Glucose-Capillary: 173 mg/dL — ABNORMAL HIGH (ref 70–99)
Glucose-Capillary: 135 mg/dL — ABNORMAL HIGH (ref 70–99)
Glucose-Capillary: 127 mg/dL — ABNORMAL HIGH (ref 70–99)
Glucose-Capillary: 115 mg/dL — ABNORMAL HIGH (ref 70–99)
Glucose-Capillary: 121 mg/dL — ABNORMAL HIGH (ref 70–99)
Glucose-Capillary: 96 mg/dL (ref 70–99)
Glucose-Capillary: 90 mg/dL (ref 70–99)

## 2022-12-07 LAB — HEPARIN LEVEL (UNFRACTIONATED)
Heparin Unfractionated: 0.3 [IU]/mL (ref 0.30–0.70)
Heparin Unfractionated: 0.24 [IU]/mL — ABNORMAL LOW (ref 0.30–0.70)
Heparin Unfractionated: 0.37 [IU]/mL (ref 0.30–0.70)

## 2022-12-07 LAB — TRIGLYCERIDES: Triglycerides: 73 mg/dL (ref <150)

## 2022-12-07 LAB — LACTIC ACID, PLASMA: Lactic Acid, Venous: 1.1 mmol/L (ref 0.5–1.9)

## 2022-12-07 MED ORDER — POTASSIUM CHLORIDE 20 MEQ PO PACK
40.0000 meq | PACK | Freq: Once | ORAL | Status: AC
  Administered 2022-12-07: 40 meq
  Filled 2022-12-07: qty 2

## 2022-12-07 MED ORDER — POTASSIUM CHLORIDE 20 MEQ PO PACK: 40.0000 meq | PACK | Freq: Once | ORAL | Status: DC

## 2022-12-07 MED ORDER — FOLIC ACID 1 MG PO TABS
1.0000 mg | ORAL_TABLET | Freq: Every day | ORAL | Status: DC
  Administered 2022-12-08 – 2022-12-15 (×7): 1 mg via ORAL
  Filled 2022-12-07 (×8): qty 1

## 2022-12-07 MED ORDER — THIAMINE MONONITRATE 100 MG PO TABS: 100.0000 mg | ORAL_TABLET | Freq: Every day | ORAL | Status: DC

## 2022-12-07 MED ORDER — ACETAMINOPHEN 325 MG PO TABS
650.0000 mg | ORAL_TABLET | ORAL | Status: DC | PRN
  Administered 2022-12-07 (×2): 650 mg
  Filled 2022-12-07 (×3): qty 2

## 2022-12-07 MED ORDER — THIAMINE HCL 100 MG/ML IJ SOLN
100.0000 mg | Freq: Every day | INTRAMUSCULAR | Status: DC
  Administered 2022-12-08 – 2022-12-09 (×2): 100 mg via INTRAVENOUS
  Filled 2022-12-07 (×4): qty 2

## 2022-12-07 MED ORDER — FAMOTIDINE 20 MG PO TABS
20.0000 mg | ORAL_TABLET | Freq: Two times a day (BID) | ORAL | Status: DC
  Administered 2022-12-07 – 2022-12-19 (×24): 20 mg via ORAL
  Filled 2022-12-07 (×24): qty 1

## 2022-12-07 MED ORDER — FOLIC ACID 1 MG PO TABS: 1.0000 mg | ORAL_TABLET | Freq: Every day | ORAL | Status: DC

## 2022-12-07 MED ORDER — ADULT MULTIVITAMIN W/MINERALS CH
1.0000 | ORAL_TABLET | Freq: Every day | ORAL | Status: DC
  Administered 2022-12-08 – 2022-12-15 (×7): 1 via ORAL
  Filled 2022-12-07 (×7): qty 1

## 2022-12-07 MED ORDER — THIAMINE HCL 100 MG/ML IJ SOLN: 100.0000 mg | Freq: Every day | INTRAMUSCULAR | Status: DC

## 2022-12-07 MED ORDER — CHLORHEXIDINE GLUCONATE CLOTH 2 % EX PADS
6.0000 | MEDICATED_PAD | Freq: Every day | CUTANEOUS | Status: DC
  Administered 2022-12-08 – 2022-12-15 (×9): 6 via TOPICAL

## 2022-12-07 MED ORDER — LORAZEPAM 2 MG/ML IJ SOLN
1.0000 mg | INTRAMUSCULAR | Status: AC | PRN
  Administered 2022-12-08: 1 mg via INTRAVENOUS
  Administered 2022-12-08 (×2): 2 mg via INTRAVENOUS
  Administered 2022-12-08 (×2): 1 mg via INTRAVENOUS
  Administered 2022-12-08 – 2022-12-09 (×4): 2 mg via INTRAVENOUS
  Filled 2022-12-07 (×3): qty 1
  Filled 2022-12-07: qty 2
  Filled 2022-12-07 (×3): qty 1

## 2022-12-07 MED ORDER — LORAZEPAM 1 MG PO TABS
1.0000 mg | ORAL_TABLET | ORAL | Status: AC | PRN
  Administered 2022-12-08: 1 mg via ORAL
  Filled 2022-12-07: qty 1

## 2022-12-07 MED ORDER — ROSUVASTATIN CALCIUM 20 MG PO TABS
20.0000 mg | ORAL_TABLET | Freq: Every day | ORAL | Status: DC
  Administered 2022-12-07: 20 mg
  Filled 2022-12-07: qty 1

## 2022-12-07 MED ORDER — POLYETHYLENE GLYCOL 3350 17 G PO PACK
17.0000 g | PACK | Freq: Every day | ORAL | Status: DC
  Administered 2022-12-08 – 2022-12-19 (×9): 17 g via ORAL
  Filled 2022-12-07 (×10): qty 1

## 2022-12-07 MED ORDER — ORAL CARE MOUTH RINSE: 15.0000 mL | OROMUCOSAL | Status: DC | PRN

## 2022-12-07 MED ORDER — POTASSIUM CHLORIDE 20 MEQ PO PACK
40.0000 meq | PACK | Freq: Two times a day (BID) | ORAL | Status: DC
  Administered 2022-12-07: 40 meq
  Filled 2022-12-07: qty 2

## 2022-12-07 MED ORDER — ROSUVASTATIN CALCIUM 20 MG PO TABS
20.0000 mg | ORAL_TABLET | Freq: Every day | ORAL | Status: DC
  Administered 2022-12-08 – 2022-12-19 (×12): 20 mg via ORAL
  Filled 2022-12-07 (×12): qty 1

## 2022-12-07 MED ORDER — DEXMEDETOMIDINE HCL IN NACL 400 MCG/100ML IV SOLN
0.0000 ug/kg/h | INTRAVENOUS | Status: DC
  Administered 2022-12-07: 0.8 ug/kg/h via INTRAVENOUS
  Administered 2022-12-07: 1 ug/kg/h via INTRAVENOUS
  Filled 2022-12-07 (×2): qty 100

## 2022-12-07 MED ORDER — SODIUM CHLORIDE 0.9 % IV SOLN
2.0000 g | INTRAVENOUS | Status: DC
  Administered 2022-12-07 – 2022-12-10 (×4): 2 g via INTRAVENOUS
  Filled 2022-12-07 (×4): qty 20

## 2022-12-07 MED ORDER — ADULT MULTIVITAMIN W/MINERALS CH: 1.0000 | ORAL_TABLET | Freq: Every day | ORAL | Status: DC

## 2022-12-07 MED ORDER — ASPIRIN 81 MG PO CHEW
81.0000 mg | CHEWABLE_TABLET | Freq: Every day | ORAL | Status: DC
  Administered 2022-12-08 – 2022-12-19 (×11): 81 mg via ORAL
  Filled 2022-12-07 (×13): qty 1

## 2022-12-07 MED ORDER — ASPIRIN 81 MG PO CHEW
81.0000 mg | CHEWABLE_TABLET | Freq: Every day | ORAL | Status: DC
  Administered 2022-12-07: 81 mg
  Filled 2022-12-07: qty 1

## 2022-12-07 MED ORDER — FUROSEMIDE 10 MG/ML IJ SOLN
80.0000 mg | Freq: Two times a day (BID) | INTRAMUSCULAR | Status: AC
  Administered 2022-12-07 (×2): 80 mg via INTRAVENOUS
  Filled 2022-12-07 (×2): qty 8

## 2022-12-07 MED ORDER — THIAMINE MONONITRATE 100 MG PO TABS
100.0000 mg | ORAL_TABLET | Freq: Every day | ORAL | Status: DC
  Administered 2022-12-10 – 2022-12-15 (×5): 100 mg via ORAL
  Filled 2022-12-07 (×6): qty 1

## 2022-12-07 MED FILL — Heparin Sodium (Porcine) Inj 1000 Unit/ML: INTRAMUSCULAR | Qty: 10 | Status: AC

## 2022-12-07 NOTE — Progress Notes (Signed)
Brief Nutrition Note:   Pt with significant improvement, not cannulated for ECMO and actually extubated this afternoon.   Currently NPO. RD previously noted wound/bruising on tongue that was painful to touch. Concern for pt's ability to safely and adequately take po given this.   Recommend considering Cortrak placement if unable to tolerate po diet and/or unable to take adequate po.   Romelle Starcher MS, RDN, LDN, CNSC Registered Dietitian 3 Clinical Nutrition RD Pager and On-Call Pager Number Located in [place]

## 2022-12-07 NOTE — Progress Notes (Signed)
Morning assessment complete and patient is drowsy but fully awake and responsive on sedation. Texting and writing messages. Educated on the need keep his right arm still due to art line presence, nods understanding.

## 2022-12-07 NOTE — Evaluation (Signed)
Physical Therapy Evaluation Patient Details Name: Jeremiah Dixon MRN: [id number] DOB: May 22, 1960 Today's Date: 12/07/2022  History of Present Illness  Pt is a 62 y/o male presenting to the ED on 11/7 with respiratory arrest.  EMS reported SpO2 in the 60's on RA.  In ED became unresponsive, pulseless and CPR initiated.  Pt intubated.  TNK given for PE concern.  25 min CPR total.  Pt extubated 11/8.   PMHx  CAD, HTN  Clinical Impression  Pt admitted with/for PEA arrest as described above.  Pt needed CGA to light mod assist for basic mobility, mostly due to the density of lines.  Pt currently limited functionally due to the problems listed below.  (see problems list.)  Pt will benefit from PT to maximize function and safety to be able to get home safely with available assist .         If plan is discharge home, recommend the following: Assistance with cooking/housework;Assist for transportation   Can travel by private vehicle        Equipment Recommendations None recommended by PT  Recommendations for Other Services       Functional Status Assessment Patient has had a recent decline in their functional status and demonstrates the ability to make significant improvements in function in a reasonable and predictable amount of time.     Precautions / Restrictions        Mobility  Bed Mobility Overal bed mobility: Needs Assistance Bed Mobility: Supine to Sit, Sit to Supine     Supine to sit: Mod assist Sit to supine: Mod assist, +2 for safety/equipment   General bed mobility comments: pt stiff and swollen with lots of lines to limit movement and need extra assist    Transfers Overall transfer level: Needs assistance   Transfers: Sit to/from Stand, Bed to chair/wheelchair/BSC Sit to Stand: Contact guard assist   Step pivot transfers: Contact guard assist            Ambulation/Gait               General Gait Details: NT due to lines  Stairs             Wheelchair Mobility     Tilt Bed    Modified Rankin (Stroke Patients Only)       Balance Overall balance assessment: No apparent balance deficits (not formally assessed), Needs assistance Sitting-balance support: No upper extremity supported, Feet supported Sitting balance-Leahy Scale: Fair       Standing balance-Leahy Scale: Fair                               Pertinent Vitals/Pain Pain Assessment Pain Assessment: Faces Faces Pain Scale: No hurt Pain Intervention(s): Monitored during session    Home Living Family/patient expects to be discharged to:: Private residence Living Arrangements: Alone;Other relatives (PRN help) Available Help at Discharge: Family;Friend(s);Available PRN/intermittently Type of Home: Apartment Home Access: Stairs to enter   Entrance Stairs-Number of Steps: flight   Home Layout: Other (Comment) (1 level on the 2nd floor) Home Equipment: None      Prior Function Prior Level of Function : Independent/Modified Independent;Working/employed;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[employer])                     Extremity/Trunk Assessment   Upper Extremity Assessment Upper Extremity Assessment: Overall [facility] for tasks assessed    Lower Extremity Assessment Lower Extremity  Assessment: Overall WFL for tasks assessed    Cervical / Trunk Assessment Cervical / Trunk Assessment: Normal  Communication   Communication Communication: No apparent difficulties  Cognition Arousal: Alert Behavior During Therapy: WFL for tasks assessed/performed Overall Cognitive Status: No family/caregiver present to determine baseline cognitive functioning (likely [facility], not sure about baseline)                                          General Comments General comments (skin integrity, edema, etc.): VSS generally,  there was a slow decline of SpO2 on RA and poor wave form, but  on reapplication of O2 sats improved.    Exercises      Assessment/Plan    PT Assessment Patient needs continued PT services  PT Problem List Decreased activity tolerance;Decreased balance;Decreased mobility;Decreased knowledge of use of DME;Decreased knowledge of precautions;Cardiopulmonary status limiting activity       PT Treatment Interventions Gait training;Stair training;Functional mobility training;Therapeutic activities;Balance training;Patient/family education    PT Goals (Current goals can be found in the Care Plan section)  Acute Rehab PT Goals Patient Stated Goal: Independent, back to work. PT Goal Formulation: With patient Time For Goal Achievement: 12/21/22 Potential to Achieve Goals: Good    Frequency Min 1X/week     Co-evaluation               AM-PAC PT "6 Clicks" Mobility  Outcome Measure Help needed turning from your back to your side while in a flat bed without using bedrails?: A Lot Help needed moving from lying on your back to sitting on the side of a flat bed without using bedrails?: Total Help needed moving to and from a bed to a chair (including a wheelchair)?: Total Help needed standing up from a chair using your arms (e.g., wheelchair or bedside chair)?: A Little Help needed to walk in hospital room?: A Little Help needed climbing 3-5 steps with a railing? : A Lot 6 Click Score: 12    End of Session Equipment Utilized During Treatment: Oxygen Activity Tolerance: Patient tolerated treatment well Patient left: in bed;with call bell/phone within reach;with nursing/sitter in room Nurse Communication: Mobility status PT Visit Diagnosis: Other abnormalities of gait and mobility (R26.89)    Time: 4098-1191 PT Time Calculation (min) (ACUTE ONLY): 34 min   Charges:   PT Evaluation $PT Eval Moderate Complexity: 1 Mod PT Treatments $Therapeutic Activity: 8-22 mins PT General Charges $$ ACUTE PT VISIT: 1 Visit         12/07/2022  Jacinto Halim., PT Acute Rehabilitation Services [phone]   (office)  Jeremiah Dixon 12/07/2022, 4:52 PM

## 2022-12-07 NOTE — Progress Notes (Signed)
PHARMACY - ANTICOAGULATION CONSULT NOTE  Pharmacy Consult for heparin Indication: atrial fibrillation, pulmonary embolism  Allergies  Allergen Reactions   Bee Venom Palpitations   Diclofenac Diarrhea   Amoxicillin Diarrhea and Nausea Only   Lisinopril Cough   Pantoprazole Nausea Only    Other Reaction(s): chest pain    Patient Measurements: Height: 6\' 2"  (188 cm) Weight: (!) 141.7 kg (312 lb 6.3 oz) IBW/kg (Calculated) : 82.2 Heparin Dosing Weight: 110kg  Vital Signs: Temp: 100.8 F (38.2 C) (11/08 1415) BP: 95/66 (11/08 1100) Pulse Rate: 84 (11/08 1415)  Labs: Recent Labs    12/06/22 0302 12/06/22 0319 12/06/22 0548 12/06/22 0827 12/06/22 0941 12/06/22 1107 12/06/22 1214 12/06/22 1414 12/06/22 1608 12/06/22 1750 12/06/22 1932 12/06/22 2217 12/07/22 0353 12/07/22 0500 12/07/22 1355  HGB 11.1*   < > 11.0*   < > 10.3*  --   --  11.6*  11.2* 10.5*  --   --   --  9.5*  --   --   HCT 35.8*   < > 33.3*   < > 29.2*  --   --  34.0*  33.0* 31.0*  --   --   --  27.0*  --   --   PLT 211  --  209  --  198  --   --   --   --   --   --   --  157  --   --   APTT 29  --   --   --  33  --   --   --   --   --   --   --   --   --   --   LABPROT 15.6*  --   --   --  18.0*  --   --   --   --   --   --   --   --   --   --   INR 1.2  --   --   --  1.5*  --   --   --   --   --   --   --   --   --   --   HEPARINUNFRC  --   --   --   --   --   --   --   --   --    < >  --  0.28*  --  0.30 0.24*  CREATININE  --   --  1.38*  --  1.51*  --    < >  --   --   --  1.12  --  0.91  --  1.02  TROPONINIHS  --   --  397*  --  2,059* 2,815*  --   --   --   --   --   --   --   --   --    < > = values in this interval not displayed.    Estimated Creatinine Clearance: 112.6 mL/min (by C-G formula based on SCr of 1.02 mg/dL).   Medical History: Past Medical History:  Diagnosis Date   MI (myocardial infarction) [facility])      Assessment: 62yo male with had PEA arrest on arrival to ED,  tenecteplase was given for suspected PE. ROSC was obtained. CXR w/ pulmonary edema and EKG w/ Afib. No anticoagulation prior to admission. Pharmacy consulted for heparin for afib and possible PE.    Heparin level is subtherapeutic  at 0.24, on heparin infusion at 1600 units/hr. Hgb 9.5, plt 157. No s/sx of bleeding or infusion issues.   Goal of Therapy:  Heparin level 0.3-0.7 units/ml Monitor platelets by anticoagulation protocol: Yes   Plan:  Increase heparin infusion to 1800 units/hr Check 6 hour heparin level  Monitor daily heparin levels, CBC, and signs/symptoms of bleeding  Thank you for allowing pharmacy to participate in this patient's care,  Sherron Monday, PharmD, BCCCP Clinical Pharmacist  Phone: [phone] 12/07/2022 3:43 PM  Please check AMION for all [facility] Pharmacy phone numbers After 10:00 PM, call Main Pharmacy [phone]

## 2022-12-07 NOTE — Progress Notes (Addendum)
Advanced Heart Failure Rounding Note  PCP-Cardiologist: None   Subjective:    Intubated. Will awake and follow commands.   On DBA 2.5, EPi 0.5, VP 0.04. Off NE. Per RN report, was very sensitive to Epi wean overnight w/ marked pressure drops. MAPs mid 70s  on current support.   Swan#s  CVP 12-13 PAP 40/21 CO 5.51 CI 2.20  Co-ox pending  PAPi 2.1  Lactic peaked 5.2>>down to 3.5 last assessment. Repeat pending.   Amio gtt 30/hr. Appears to be in NSR on tele. Brief funs of NSVT overnight. On Heparin gtt   Hgb 12>>9.5. Plts 157   Temp overnight, 100.6. WBC nl at 6.9   Brisk UOP, 7.4L out yesterday  Na 127 SCr 0.91 K 3.7 Mg 2.0     Objective:   Weight Range: (!) 141.7 kg Body mass index is 40.11 kg/m.   Vital Signs:   Temp:  [98.2 F (36.8 C)-100.6 F (38.1 C)] 99.9 F (37.7 C) (11/08 0620) Pulse Rate:  [65-118] 65 (11/08 0620) Resp:  [15-32] 25 (11/08 0620) BP: (73-141)/(47-82) 99/57 (11/07 1516) SpO2:  [83 %-100 %] 100 % (11/08 0620) Arterial Line BP: (79-157)/(42-69) 137/65 (11/08 0620) FiO2 (%):  [70 %-100 %] 80 % (11/08 0425) Weight:  [141.7 kg] 141.7 kg (11/08 0500) Last BM Date : 12/06/22  Weight change: Filed Weights   12/06/22 0252 12/07/22 0500  Weight: 126 kg (!) 141.7 kg    Intake/Output:   Intake/Output Summary (Last 24 hours) at 12/07/2022 0717 Last data filed at 12/07/2022 0615 Gross per 24 hour  Intake 4236.32 ml  Output 7495 ml  Net -3258.68 ml      Physical Exam    CVP 9  General:  intubated, awake on vent and following commands  HEENT: Normal + ETT Neck: Supple. Thick neck JVD not well visualized . Carotids 2+ bilat; no bruits. No lymphadenopathy or thyromegaly appreciated. Cor: PMI nondisplaced. Regular rate & rhythm. No rubs, gallops or murmurs. Lungs: intubated, clear anteriorly  Abdomen: Soft, nontender, +distended. No hepatosplenomegaly. No bruits or masses. Good bowel sounds. Extremities: No cyanosis, clubbing,  rash, 2-3+ b/l LEE up to thighs  Neuro: intubated but will awake on vent, follows commands.    Telemetry   NSR 70s   EKG    No new EKG to review   Labs    CBC Recent Labs    12/06/22 0941 12/06/22 1414 12/06/22 1608 12/07/22 0353  WBC 14.6*  --   --  6.9  HGB 10.3*   < > 10.5* 9.5*  HCT 29.2*   < > 31.0* 27.0*  MCV 95.4  --   --  90.0  PLT 198  --   --  157   < > = values in this interval not displayed.   Basic Metabolic Panel Recent Labs    16/10/96 0548 12/06/22 0827 12/06/22 1932 12/07/22 0353  NA 130*   < > 127* 127*  K 3.2*   < > 3.6 3.7  CL 91*   < > 91* 93*  CO2 22   < > 24 23  GLUCOSE 205*   < > 231* 142*  BUN 10   < > 12 9  CREATININE 1.38*   < > 1.12 0.91  CALCIUM 7.6*   < > 7.1* 7.2*  MG 1.7   < > 1.7 2.0  PHOS 6.0*  --   --   --    < > = values in this interval not  displayed.   Liver Function Tests Recent Labs    12/06/22 0548 12/06/22 0941  AST 236* 201*  ALT 128* 115*  ALKPHOS 85 66  BILITOT 1.0 1.4*  PROT 5.1* 4.8*  ALBUMIN 2.6* 2.6*   No results for input(s): "LIPASE", "AMYLASE" in the last 72 hours. Cardiac Enzymes No results for input(s): "CKTOTAL", "CKMB", "CKMBINDEX", "TROPONINI" in the last 72 hours.  BNP: BNP (last 3 results) Recent Labs    12/06/22 0551  BNP 402.8*    ProBNP (last 3 results) No results for input(s): "PROBNP" in the last 8760 hours.   D-Dimer No results for input(s): "DDIMER" in the last 72 hours. Hemoglobin A1C Recent Labs    12/06/22 0551  HGBA1C 5.6   Fasting Lipid Panel Recent Labs    12/07/22 0353  TRIG 73   Thyroid Function Tests Recent Labs    12/06/22 0551  TSH 6.918*    Other results:   Imaging    VAS [place] LOWER EXTREMITY VENOUS (DVT)  Result Date: 12/06/2022  Lower Venous DVT Study Patient Name:  Jeremiah Dixon  Date of Exam:   12/06/2022 Medical Rec #: [id number]             Accession #:    [id number] Date of Birth: 07-21-1960             Patient Gender: M Patient  Age:   62 years Exam Location:  [facility] [place] [place] Procedure:      VAS [place] LOWER EXTREMITY VENOUS (DVT) Referring Phys: AMY CLEGG --------------------------------------------------------------------------------  Indications: Swelling, and Edema.  Risk Factors: Suspected PE. Comparison Study: No prior exam. Performing Technologist: Fernande Bras  Examination Guidelines: A complete evaluation includes B-mode imaging, spectral Doppler, color Doppler, and power Doppler as needed of all accessible portions of each vessel. Bilateral testing is considered an integral part of a complete examination. Limited examinations for reoccurring indications may be performed as noted. The reflux portion of the exam is performed with the patient in reverse Trendelenburg.  +---------+---------------+---------+-----------+----------+--------------+ RIGHT    CompressibilityPhasicitySpontaneityPropertiesThrombus Aging +---------+---------------+---------+-----------+----------+--------------+ CFV      Full           Yes      Yes                                 +---------+---------------+---------+-----------+----------+--------------+ SFJ      Full                                                        +---------+---------------+---------+-----------+----------+--------------+ FV Prox  Full                                                        +---------+---------------+---------+-----------+----------+--------------+ FV Mid   Full                                                        +---------+---------------+---------+-----------+----------+--------------+ FV DistalFull                                                        +---------+---------------+---------+-----------+----------+--------------+  PFV      Full                                                        +---------+---------------+---------+-----------+----------+--------------+ POP      Full           Yes      Yes                                  +---------+---------------+---------+-----------+----------+--------------+ PTV      Full                                                        +---------+---------------+---------+-----------+----------+--------------+ PERO     Full                                                        +---------+---------------+---------+-----------+----------+--------------+   +---------+---------------+---------+-----------+----------+--------------+ LEFT     CompressibilityPhasicitySpontaneityPropertiesThrombus Aging +---------+---------------+---------+-----------+----------+--------------+ CFV      Full           Yes      Yes                                 +---------+---------------+---------+-----------+----------+--------------+ SFJ      Full                                                        +---------+---------------+---------+-----------+----------+--------------+ FV Prox  Full                                                        +---------+---------------+---------+-----------+----------+--------------+ FV Mid   Full                                                        +---------+---------------+---------+-----------+----------+--------------+ FV DistalFull                                                        +---------+---------------+---------+-----------+----------+--------------+ PFV      Full                                                        +---------+---------------+---------+-----------+----------+--------------+  POP      Full           Yes      Yes                                 +---------+---------------+---------+-----------+----------+--------------+ PTV      Full                                                        +---------+---------------+---------+-----------+----------+--------------+ PERO     Full                                                         +---------+---------------+---------+-----------+----------+--------------+     Summary: BILATERAL: - No evidence of deep vein thrombosis seen in the lower extremities, bilaterally. -No evidence of popliteal cyst, bilaterally.   *See table(s) above for measurements and observations. Electronically signed by Heath Lark on 12/06/2022 at 6:04:07 PM.    Final    DG Chest Port 1 View  Result Date: 12/06/2022 CLINICAL DATA:  Central line placement EXAM: PORTABLE CHEST 1 VIEW COMPARISON:  Chest radiograph obtained earlier the same day FINDINGS: There is a new left IJ central venous catheter terminating in the mid SVC. The endotracheal tube tip is proximally 3.7 cm from the carina. The enteric catheter tip and sidehole project over the stomach. The heart is enlarged, unchanged. The upper mediastinal contours are stable. There is a probable small layering left pleural effusion with adjacent airspace opacity. Aeration in both lungs has improved compared to the study from earlier the same day particularly on the right. There is no significant right effusion. There is no pneumothorax There is no acute osseous abnormality. IMPRESSION: 1. New left IJ central venous catheter with the tip in the mid SVC. 2. Improved aeration in both lungs since the study from earlier the same day with a persistent layering left pleural effusion and adjacent airspace opacity. Electronically Signed   By: Lesia Hausen M.D.   On: 12/06/2022 11:36   ECHOCARDIOGRAM COMPLETE  Result Date: 12/06/2022    ECHOCARDIOGRAM REPORT   Patient Name:   Jeremiah Dixon Date of Exam: 12/06/2022 Medical Rec #:  [id number]            Height:       74.0 in Accession #:    [id number]           Weight:       277.8 lb Date of Birth:  [birth year]            BSA:          2.499 m Patient Age:    62 years             BP:           80/47 mmHg Patient Gender: M                    HR:           101 bpm. Exam Location:  Inpatient Procedure: 2D Echo, Cardiac Doppler,  Color Doppler and Intracardiac  Opacification Agent Indications:    Cardiac Arrest I46.9  History:        Patient has no prior history of Echocardiogram examinations.                 Acute MI.  Sonographer:    Harriette Bouillon RDCS Referring Phys: [id number] Beulah Gandy PAYNE IMPRESSIONS  1. Limited acoustic windows for imaging. Limited diagnostic quality.  2. Left ventricular ejection fraction, by estimation, is 35 to 40%. Septal flattening. The left ventricle has moderately decreased function. Left ventricular endocardial border not optimally defined to evaluate regional wall motion. The left ventricular  internal cavity size was mildly dilated. Left ventricular diastolic parameters are indeterminate.  3. Right ventricular systolic function is mildly reduced particularly at mid and apex. The right ventricular size is mildly enlarged. Tricuspid regurgitation signal is inadequate for assessing PA pressure.  4. Right atrial size was mildly dilated.  5. The mitral valve is grossly normal. Trivial mitral valve regurgitation.  6. The aortic valve is grossly normal. There is mild calcification of the aortic valve. Aortic valve regurgitation is not visualized. No aortic stenosis is present.  7. The inferior vena cava is dilated in size with <50% respiratory variability, suggesting right atrial pressure of 15 mmHg. FINDINGS  Left Ventricle: Left ventricular ejection fraction, by estimation, is 35 to 40%. The left ventricle has moderately decreased function. Left ventricular endocardial border not optimally defined to evaluate regional wall motion. Definity contrast agent was given IV to delineate the left ventricular endocardial borders. The left ventricular internal cavity size was mildly dilated. There is borderline left ventricular hypertrophy. Left ventricular diastolic parameters are indeterminate. Right Ventricle: The right ventricular size is mildly enlarged. Right vetricular wall thickness was not well visualized.  Right ventricular systolic function is mildly reduced. Tricuspid regurgitation signal is inadequate for assessing PA pressure. Left Atrium: Left atrial size was normal in size. Right Atrium: Right atrial size was mildly dilated. Pericardium: There is no evidence of pericardial effusion. Mitral Valve: The mitral valve is grossly normal. Trivial mitral valve regurgitation. Tricuspid Valve: The tricuspid valve is grossly normal. Tricuspid valve regurgitation is trivial. Aortic Valve: The aortic valve is grossly normal. There is mild calcification of the aortic valve. Aortic valve regurgitation is not visualized. No aortic stenosis is present. Pulmonic Valve: The pulmonic valve was not well visualized. Pulmonic valve regurgitation is trivial. Aorta: The aortic root is normal in size and structure. Venous: The inferior vena cava is dilated in size with less than 50% respiratory variability, suggesting right atrial pressure of 15 mmHg. IAS/Shunts: No atrial level shunt detected by color flow Doppler.  LEFT VENTRICLE PLAX 2D LVIDd:         5.80 cm LVIDs:         4.90 cm LV PW:         1.00 cm LV IVS:        1.00 cm LVOT diam:     2.40 cm LV SV:         49 LV SV Index:   19 LVOT Area:     4.52 cm  RIGHT VENTRICLE         IVC TAPSE (M-mode): 1.2 cm  IVC diam: 2.70 cm LEFT ATRIUM         Index LA diam:    3.80 cm 1.52 cm/m  AORTIC VALVE LVOT Vmax:   79.30 cm/s LVOT Vmean:  54.296 cm/s LVOT VTI:    0.108 m  AORTA Ao Root diam:  3.10 cm Ao Asc diam:  3.60 cm  SHUNTS Systemic VTI:  0.11 m Systemic Diam: 2.40 cm Weston Brass MD Electronically signed by Weston Brass MD Signature Date/Time: 12/06/2022/11:20:05 AM    Final      Medications:     Scheduled Medications:  Chlorhexidine Gluconate Cloth  6 each Topical Tomorrow-1000   docusate  100 mg Per Tube BID   famotidine  20 mg Per Tube BID   folic acid  1 mg Per Tube Daily   insulin aspart  0-15 Units Subcutaneous Q4H   multivitamin with minerals  1 tablet Per  Tube Daily   mouth rinse  15 mL Mouth Rinse Q2H   polyethylene glycol  17 g Per Tube Daily   thiamine  100 mg Per Tube Daily    Infusions:  sodium chloride 5 mL/hr at 12/07/22 0600   sodium chloride 10 mL/hr at 12/07/22 0600   amiodarone 30 mg/hr (12/07/22 0600)   dexmedetomidine (PRECEDEX) IV infusion 1 mcg/kg/hr (12/07/22 0610)   DOBUTamine 2.5 mcg/kg/min (12/07/22 0600)   epinephrine 0.5 mcg/min (12/07/22 0600)   fentaNYL infusion INTRAVENOUS 250 mcg/hr (12/07/22 0600)   heparin 1,600 Units/hr (12/07/22 0600)   norepinephrine (LEVOPHED) Adult infusion Stopped (12/07/22 0306)   vasopressin 0.04 Units/min (12/07/22 0600)    PRN Medications: sodium chloride, sodium chloride, fentaNYL, mouth rinse    Patient Profile   Mr Dresel is a 62 year old male with a history of CAD, HTN,  and HLD.    Presented w/ CP and had PEA arrest in the ED. ROSC ~ 25 minutes. TNK given for presumed PE. AHF team consulted for cardiogenic shock.   Assessment/Plan   1. Cardiogenic shock: S/p PEA arrest with tenecteplase 11/7.  Working diagnosis was a large PE.  I reviewed echo at bedside (difficult study) with LV EF difficult to quantify but looks in 35% range, RV moderately dilated/moderately dysfunctional with D-shaped septum, IVC dilated.  He is currently intubated on DBA 2.5, epinephrine 0.5 and vasopressin 0.04. Off NE. MAPs mid 70s. CI 2.2. PAPi 2.1. Lactate 5.2>>3.5>>pending. CVP 9 but c/w marked peripheral edema on exam/3rd spacing  - Continue current inotropic support. Would wait to wean until better diuresed. Check Co-ox and obtain repeat lactate, follow for clearance  - Continue IV Lasix for diuresis, 80 bid. Supp K  - unna boots for LEE - We have discussed ECMO cannulation for biventricular (RV > LV) failure post ?large PE, RP Impella also option.  Seems to be more stable now.  2. CAD: Known occlusion of OM1 in 2007.  HS-TnI 397 => 2815 post arrest, no STEMI on ECG.  Has received  tenecteplase.  - Resume ASA/statin.  - Should get eventual cath.  3. 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  Has had tenecteplase.  Lower extremity dopplers with no DVT.  - Continue heparin gtt.  4. Atrial fibrillation: With RVR on admission, no prior history.  In NSR this morning.  - Cont Amiodarone gtt while on pressors/inotropes.  - Cont heparin gtt 5. Acute hypoxemic respiratory failure: On vent, last ABG reasonable. CCM following.  6. ETOH abuse: Per brother, "1/2 gallon beer" daily.  7. Hyponatremia: Hypervolemic hyponatremia.   Length of Stay: 1  Brittainy Simmons, PA-C  12/07/2022, 7:17 AM  Advanced Heart Failure Team Pager [phone] (M-F; 7a - 5p)  Please contact [facility] for night-coverage after hours (5p -7a ) and weekends on [email or web address]  Patient seen with PA, agree with the  above note.   SBP 160s on dobutamine 2.5, epinephrine 0.5, vasopressin 0.04.  CI 2.2 thermo, no co-ox yet.  Creatinine 0.91.  CVP 12-13.   Good diuresis yesterday, net negative 3258 with IV Lasix.   He is now in NSR on amiodarone and heparin gtt.   Low grade fever to 100.2, WBCs normal.   General: NAD Neck: JVP 12 cm, no thyromegaly or thyroid nodule.  Lungs: Decreased at bases.  CV: Nondisplaced PMI.  Heart regular S1/S2, no S3/S4, no murmur.  1+ edema to thighs.   Abdomen: Soft, nontender, no hepatosplenomegaly, no distention.  Skin: Intact without lesions or rashes.  Neurologic: Awake on vent.  Extremities: No clubbing or cyanosis.  HEENT: Normal.   Acute systolic CHF with LV EF around 25% and moderate RV failure.  Received tenecteplase in ER given concern for massive PE in setting of PEA arrest but could this all be CHF with new AF/RVR?  HS-TnI rose to 2815 post- arrest.  Cardiac output adequate currently and MAP elevated. CVP remains high 12-13.  - Continue dobutamine 2.5.  - Wean vasopressin and epinephrine as BP tolerates.  - Check co-ox and lactate.  - Lasix 80  mg IV bid today.  - Diagnosis of PE still uncertain, LE [place] with no DVT.  CCM following. Will remain on anticoagulation regardless given AF.  - Eventual RHC/LHC.   CCM following, possible extubation today.   Continue amiodarone gtt and heparin gtt with new onset AF/RVR, now back in NSR.    Low grade fever, normal WBCs.  Will send sputum/blood cultures + procalcitonin, discuss empiric abx with CCM.   With h/o CAD, will restart ASA 81 and Crestor 20 mg daily.   CRITICAL CARE Performed by: Marca Ancona  Total critical care time: 40 minutes  Critical care time was exclusive of separately billable procedures and treating other patients.  Critical care was necessary to treat or prevent imminent or life-threatening deterioration.  Critical care was time spent personally by me on the following activities: development of treatment plan with patient and/or surrogate as well as nursing, discussions with consultants, evaluation of patient's response to treatment, examination of patient, obtaining history from patient or surrogate, ordering and performing treatments and interventions, ordering and review of laboratory studies, ordering and review of radiographic studies, pulse oximetry and re-evaluation of patient's condition.  Marca Ancona 12/07/2022 8:53 AM

## 2022-12-07 NOTE — Plan of Care (Signed)

## 2022-12-07 NOTE — Progress Notes (Signed)
[facility] ADULT ICU REPLACEMENT PROTOCOL   The patient does apply for the [facility] Adult ICU Electrolyte Replacment Protocol based on the criteria listed below:   1.Exclusion criteria: TCTS, ECMO, Dialysis, and Myasthenia Gravis patients 2. Is GFR >/= 30 ml/min? Yes.    Patient's GFR today is >60 3. Is SCr </= 2? Yes.   Patient's SCr is 0.91 mg/dL 4. Did SCr increase >/= 0.5 in 24 hours? No. 5.Pt's weight >40kg  Yes.   6. Abnormal electrolyte(s): potassium 3.7  7. Electrolytes replaced per protocol 8.  Call MD STAT for K+ </= 2.5, Phos </= 1, or Mag </= 1 Physician:  protocol  Melvern Banker 12/07/2022 4:42 AM

## 2022-12-07 NOTE — Procedures (Signed)
Extubation Procedure Note  Patient Details:   Name: Jeremiah Dixon DOB: 1960-06-01 MRN: [id number]   Airway Documentation:    Vent end date: 12/07/22 Vent end time: 1249   Evaluation  O2 sats: stable throughout Complications: No apparent complications Patient did tolerate procedure well. Bilateral Breath Sounds: Clear, Diminished   Yes  Patient extubated per order to 4L [place] with no complications. Positive cuff leak was noted prior to extubation. Patient is alert and oriented, has strong cough, and is able to weakly speak. Vitals are stable. RT will continue to monitor.   Shakeira Rhee Lajuana Ripple 12/07/2022, 1:08 PM

## 2022-12-07 NOTE — Progress Notes (Signed)
NAME:  Jeremiah Dixon, MRN:  [id number], DOB:  [birth year], LOS: 1 ADMISSION DATE:  12/06/2022, CONSULTATION DATE:  12/06/2022 REFERRING MD:  Dr. Madilyn Hook, EDP, CHIEF COMPLAINT:  resp arrest    History of Present Illness:  62 year old male with past medical history of CAD, HTN, HLD, prediabetes who presented to the emergency department on 12/06/22 with respiratory arrest. Per EDP, initial complaint of shortness of breath. EMS reported being called out to home for chest pain and difficulty breathing. SpO2 in 60s for EMS. When he arrived to ED, unresponsive, pulseless and CPR was initiated. He was subsequently intubated. TNK was given during arrest out of concern for PE. EDP notes approximately 25 minutes of CPR total. Was started on sedation, epi gtt and PCCM consulted for admission.   Pertinent  Medical History  CAD, HTN, HLD  Significant Hospital Events: Including procedures, antibiotic start and stop dates in addition to other pertinent events   11/7: respiratory distress, hypoxemia subsequent pulselessness and ~32min CPR until ROSC. Admitted to ICU early AM. On AM rounds, awake and alert, however appears to be in cardiogenic shock. He is cold on multiple pressors. Echo with RV/LV dysfunction. Got aline, cvc. Swan placed and was fortunately assuring, he did not need cardiac mechanical support 11/8 decr vent support as able, weaning vasoactive meds   Interim History / Subjective:  7 L out yesterday/overnight  Has been really sensitive to epi gtt wean, on low dose now + vaso and dba Brief NSVT overnight, but otherwise looks sinus (was fib)   Temps 99-100   I ordered a coox   Objective   Blood pressure (!) 99/57, pulse 75, temperature 100 F (37.8 C), resp. rate (!) 28, height 6\' 2"  (1.88 m), weight (!) 141.7 kg, SpO2 99%. PAP: (20-60)/(11-51) 23/11 CVP:  [5 mmHg-59 mmHg] 15 mmHg PCWP:  [26 mmHg] 26 mmHg CO:  [5.5 L/min-7.4 L/min] 5.5 L/min CI:  [2.2 L/min/m2-2.95 L/min/m2] 2.2  L/min/m2  Vent Mode: PRVC FiO2 (%):  [70 %-100 %] 80 % Set Rate:  [28 bmp] 28 bmp Vt Set:  [650 mL] 650 mL PEEP:  [12 cmH20] 12 cmH20 Plateau Pressure:  [27 cmH20-33 cmH20] 28 cmH20   Intake/Output Summary (Last 24 hours) at 12/07/2022 1018 Last data filed at 12/07/2022 0900 Gross per 24 hour  Intake 3582.14 ml  Output 7440 ml  Net -3857.86 ml   Filed Weights   12/06/22 0252 12/07/22 0500  Weight: 126 kg (!) 141.7 kg    Examination: General: critically ill obese older adult M intubated NAD  HENT NCAT pink mm ETT secure, wears glasses  Lungs: symmetrical chest expansion, mechanically ventilated  Cardiovascular: cap refill < 3 sec s1s2  Abdomen: round soft ndnt  Extremities: BLE edema  Neuro: Awake alert following commands communicating via phone  GU: foley   Resolved Hospital Problem list     Assessment & Plan:   Acute hypoxic respiratory failure  L pleural effusion -in setting of ?PE, cardiac arrest, volume overload P -decr MV support --this morning to PEEP 8 FiO2 60, hope to further wean and then SBT, hopefully extubate later today -plausible that he may require 8 PEEP on PSV, could be a at bedtime BiPAP candidate  -diuresis as below  -PRN CXR and ABG   PEA arrest   Possible massive PE  Cardiogenic shock Afib, now sinus   NSVT  CAD Hx HTN HLD -s/p ROSC with ~25 minutes of down time, rcvd tnk with c/f PE as catalyst, but was  too unstable to image. Subsequently cold and on multiple pressors with BiV dysfxn and a D shaped septum  -DVT w/u was neg, PE dx is uncertain. Unlikely that we will be able to surely know. Could be acute HF r/t  fib  -coox has resulted at 60 on 0.25 epi 0.04 vaso and DBA 2.5  P -adv HF following -- fortunately has improved with DBA and was able to avoid needing adv cardiac mechanical support yesterday -keep DBA as is. if able to wean pressors, typically id wean off epi first however w his sensitivity to epi, I would take vaso to 0.03 first,  then vaso off vs epi off -- but this just all depend on how he tolerates small adjustments.  -cont hep gtt and eventually DOAC (regardless of +/- PE, has afib)  -amio  -optimize lytes -- needs K 11/8 -eventual R/LHC -diurese  -statin, asa  Lactic acidosis, improved -in setting of arrest, cardiogenic shock  P -PRN LA  Hyponatremia -volume overload, HF P -diurese as above -follow renal indices, UOP  Prediabetes  - SSI - CBG q4   Anemia - PRN CBC  EtOH use  -B vit and micronutrient support  -on precedex for PAD right now, doing well   Best Practice (right click and "Reselect all SmartList Selections" daily)   Diet/type: NPO DVT prophylaxis: systemic heparin GI prophylaxis: H2B Lines: Central line, Arterial Line, and yes and it is still needed Foley:  Yes, and it is still needed Code Status:  full code Last date of multidisciplinary goals of care discussion [attempts to call brother unanswered, will try again later]  Labs   CBC: Recent Labs  Lab 12/06/22 0302 12/06/22 0319 12/06/22 0548 12/06/22 0827 12/06/22 0941 12/06/22 1414 12/06/22 1608 12/07/22 0353  WBC 8.5  --  15.2*  --  14.6*  --   --  6.9  HGB 11.1*   < > 11.0* 10.5* 10.3* 11.6*  11.2* 10.5* 9.5*  HCT 35.8*   < > 33.3* 31.0* 29.2* 34.0*  33.0* 31.0* 27.0*  MCV 106.2*  --  99.7  --  95.4  --   --  90.0  PLT 211  --  209  --  198  --   --  157   < > = values in this interval not displayed.    Basic Metabolic Panel: Recent Labs  Lab 12/06/22 0548 12/06/22 0827 12/06/22 0941 12/06/22 0945 12/06/22 1214 12/06/22 1414 12/06/22 1608 12/06/22 1932 12/07/22 0353  NA 130*   < > 129*  --  128* 127*  127* 126* 127* 127*  K 3.2*   < > 3.2*  --  3.2* 3.4*  3.5 3.4* 3.6 3.7  CL 91*  --  95*  --  93*  --   --  91* 93*  CO2 22  --  20*  --  23  --   --  24 23  GLUCOSE 205*  --  230*  --  270*  --   --  231* 142*  BUN 10  --  13  --  12  --   --  12 9  CREATININE 1.38*  --  1.51*  --  1.43*  --    --  1.12 0.91  CALCIUM 7.6*  --  7.3*  --  7.3*  --   --  7.1* 7.2*  MG 1.7  --   --  1.7  --   --   --  1.7 2.0  PHOS  6.0*  --   --   --   --   --   --   --   --    < > = values in this interval not displayed.   GFR: Estimated Creatinine Clearance: 126.2 mL/min (by C-G formula based on SCr of 0.91 mg/dL). Recent Labs  Lab 12/06/22 0302 12/06/22 0548 12/06/22 0551 12/06/22 0941 12/06/22 1115 12/06/22 1622 12/06/22 1709 12/06/22 1932 12/07/22 0353 12/07/22 0831  WBC 8.5 15.2*  --  14.6*  --   --   --   --  6.9  --   LATICACIDVEN  --   --    < > 3.7*   < > 3.5* 3.7* 3.5*  --  1.1   < > = values in this interval not displayed.    Liver Function Tests: Recent Labs  Lab 12/06/22 0548 12/06/22 0941  AST 236* 201*  ALT 128* 115*  ALKPHOS 85 66  BILITOT 1.0 1.4*  PROT 5.1* 4.8*  ALBUMIN 2.6* 2.6*   No results for input(s): "LIPASE", "AMYLASE" in the last 168 hours. No results for input(s): "AMMONIA" in the last 168 hours.  ABG    Component Value Date/Time   PHART 7.438 12/06/2022 1608   PCO2ART 29.3 (L) 12/06/2022 1608   PO2ART 98 12/06/2022 1608   HCO3 19.8 (L) 12/06/2022 1608   TCO2 21 (L) 12/06/2022 1608   ACIDBASEDEF 4.0 (H) 12/06/2022 1608   O2SAT 60.8 12/07/2022 0831     Coagulation Profile: Recent Labs  Lab 12/06/22 0302 12/06/22 0941  INR 1.2 1.5*    Cardiac Enzymes: No results for input(s): "CKTOTAL", "CKMB", "CKMBINDEX", "TROPONINI" in the last 168 hours.  HbA1C: Hgb A1c MFr Bld  Date/Time Value Ref Range Status  12/06/2022 05:51 AM 5.6 4.8 - 5.6 % Final    Comment:    (NOTE) Pre diabetes:          5.7%-6.4%  Diabetes:              >6.4%  Glycemic control for   <7.0% adults with diabetes   06/07/2009 09:50 PM 5.7 (H) <5.7 % Final    Comment:    See lab report for associated comment(s)    CBG: Recent Labs  Lab 12/06/22 1517 12/06/22 2000 12/06/22 2319 12/07/22 0311 12/07/22 0819  GLUCAP 274* 229* 173* 135* 127*    CRITICAL  CARE Performed by: Lanier Clam   Total critical care time: 51 minutes  Critical care time was exclusive of separately billable procedures and treating other patients. Critical care was necessary to treat or prevent imminent or life-threatening deterioration.  Critical care was time spent personally by me on the following activities: development of treatment plan with patient and/or surrogate as well as nursing, discussions with consultants, evaluation of patient's response to treatment, examination of patient, obtaining history from patient or surrogate, ordering and performing treatments and interventions, ordering and review of laboratory studies, ordering and review of radiographic studies, pulse oximetry and re-evaluation of patient's condition.  Tessie Fass MSN, AGACNP-BC [facility] Pulmonary/Critical Care Medicine Amion for pager  12/07/2022, 10:18 AM

## 2022-12-08 ENCOUNTER — Inpatient Hospital Stay (HOSPITAL_COMMUNITY): Payer: Managed Care, Other (non HMO)

## 2022-12-08 DIAGNOSIS — I469 Cardiac arrest, cause unspecified: Secondary | ICD-10-CM | POA: Diagnosis not present

## 2022-12-08 LAB — CBC
HCT: 25.8 % — ABNORMAL LOW (ref 39.0–52.0)
HCT: 27.4 % — ABNORMAL LOW (ref 39.0–52.0)
Hemoglobin: 8.9 g/dL — ABNORMAL LOW (ref 13.0–17.0)
Hemoglobin: 9.3 g/dL — ABNORMAL LOW (ref 13.0–17.0)
MCH: 32.7 pg (ref 26.0–34.0)
MCH: 32.6 pg (ref 26.0–34.0)
MCHC: 34.5 g/dL (ref 30.0–36.0)
MCHC: 33.9 g/dL (ref 30.0–36.0)
MCV: 94.9 fL (ref 80.0–100.0)
MCV: 96.1 fL (ref 80.0–100.0)
Platelets: 140 10*3/uL — ABNORMAL LOW (ref 150–400)
Platelets: 155 10*3/uL (ref 150–400)
RBC: 2.72 MIL/uL — ABNORMAL LOW (ref 4.22–5.81)
RBC: 2.85 MIL/uL — ABNORMAL LOW (ref 4.22–5.81)
RDW: 13.1 % (ref 11.5–15.5)
RDW: 13.1 % (ref 11.5–15.5)
WBC: 7.9 10*3/uL (ref 4.0–10.5)
WBC: 9.8 10*3/uL (ref 4.0–10.5)
nRBC: 0 % (ref 0.0–0.2)
nRBC: 0 % (ref 0.0–0.2)

## 2022-12-08 LAB — BASIC METABOLIC PANEL
Anion gap: 10 (ref 5–15)
Anion gap: 9 (ref 5–15)
BUN: 11 mg/dL (ref 8–23)
BUN: 8 mg/dL (ref 8–23)
CO2: 26 mmol/L (ref 22–32)
CO2: 25 mmol/L (ref 22–32)
Calcium: 8.1 mg/dL — ABNORMAL LOW (ref 8.9–10.3)
Calcium: 7.8 mg/dL — ABNORMAL LOW (ref 8.9–10.3)
Chloride: 94 mmol/L — ABNORMAL LOW (ref 98–111)
Chloride: 90 mmol/L — ABNORMAL LOW (ref 98–111)
Creatinine, Ser: 0.89 mg/dL (ref 0.61–1.24)
Creatinine, Ser: 0.85 mg/dL (ref 0.61–1.24)
GFR, Estimated: >60 mL/min (ref >60)
GFR, Estimated: >60 mL/min (ref >60)
Glucose, Bld: 94 mg/dL (ref 70–99)
Glucose, Bld: 142 mg/dL — ABNORMAL HIGH (ref 70–99)
Potassium: 3.5 mmol/L (ref 3.5–5.1)
Potassium: 3.5 mmol/L (ref 3.5–5.1)
Sodium: 130 mmol/L — ABNORMAL LOW (ref 135–145)
Sodium: 124 mmol/L — ABNORMAL LOW (ref 135–145)

## 2022-12-08 LAB — HEPARIN LEVEL (UNFRACTIONATED)
Heparin Unfractionated: 0.28 [IU]/mL — ABNORMAL LOW (ref 0.30–0.70)
Heparin Unfractionated: 0.35 [IU]/mL (ref 0.30–0.70)
Heparin Unfractionated: 0.39 [IU]/mL (ref 0.30–0.70)

## 2022-12-08 LAB — GLUCOSE, CAPILLARY
Glucose-Capillary: 105 mg/dL — ABNORMAL HIGH (ref 70–99)
Glucose-Capillary: 109 mg/dL — ABNORMAL HIGH (ref 70–99)
Glucose-Capillary: 112 mg/dL — ABNORMAL HIGH (ref 70–99)
Glucose-Capillary: 116 mg/dL — ABNORMAL HIGH (ref 70–99)
Glucose-Capillary: 72 mg/dL (ref 70–99)
Glucose-Capillary: 84 mg/dL (ref 70–99)

## 2022-12-08 LAB — COOXEMETRY PANEL
Carboxyhemoglobin: 2.5 % — ABNORMAL HIGH (ref 0.5–1.5)
Methemoglobin: <0.7 % (ref 0.0–1.5)
O2 Saturation: 69.1 %
Total hemoglobin: 9 g/dL — ABNORMAL LOW (ref 12.0–16.0)

## 2022-12-08 LAB — MAGNESIUM: Magnesium: 1.6 mg/dL — ABNORMAL LOW (ref 1.7–2.4)

## 2022-12-08 MED ORDER — SPIRONOLACTONE 12.5 MG HALF TABLET
12.5000 mg | ORAL_TABLET | Freq: Every day | ORAL | Status: DC
  Administered 2022-12-08 – 2022-12-09 (×2): 12.5 mg via ORAL
  Filled 2022-12-08 (×3): qty 1

## 2022-12-08 MED ORDER — POTASSIUM CHLORIDE CRYS ER 20 MEQ PO TBCR
40.0000 meq | EXTENDED_RELEASE_TABLET | Freq: Once | ORAL | Status: AC
  Administered 2022-12-08: 40 meq via ORAL
  Filled 2022-12-08: qty 2

## 2022-12-08 MED ORDER — POTASSIUM CHLORIDE CRYS ER 20 MEQ PO TBCR
40.0000 meq | EXTENDED_RELEASE_TABLET | ORAL | Status: AC
  Administered 2022-12-08 (×2): 40 meq via ORAL
  Filled 2022-12-08 (×2): qty 2

## 2022-12-08 MED ORDER — MAGNESIUM SULFATE 4 GM/100ML IV SOLN
4.0000 g | Freq: Once | INTRAVENOUS | Status: AC
  Administered 2022-12-08: 4 g via INTRAVENOUS
  Filled 2022-12-08: qty 100

## 2022-12-08 MED ORDER — DIGOXIN 125 MCG PO TABS
0.1250 mg | ORAL_TABLET | Freq: Every day | ORAL | Status: DC
  Administered 2022-12-08 – 2022-12-19 (×12): 0.125 mg via ORAL
  Filled 2022-12-08 (×12): qty 1

## 2022-12-08 MED ORDER — LOSARTAN POTASSIUM 25 MG PO TABS
12.5000 mg | ORAL_TABLET | Freq: Every day | ORAL | Status: DC
  Administered 2022-12-08: 12.5 mg via ORAL
  Filled 2022-12-08 (×2): qty 1

## 2022-12-08 MED ORDER — TRAZODONE HCL 50 MG PO TABS
100.0000 mg | ORAL_TABLET | Freq: Every evening | ORAL | Status: DC | PRN
  Administered 2022-12-09 – 2022-12-18 (×7): 100 mg via ORAL
  Filled 2022-12-08 (×7): qty 2

## 2022-12-08 MED ORDER — FUROSEMIDE 10 MG/ML IJ SOLN
80.0000 mg | Freq: Once | INTRAMUSCULAR | Status: AC
  Administered 2022-12-08: 80 mg via INTRAVENOUS
  Filled 2022-12-08: qty 8

## 2022-12-08 MED ORDER — METOLAZONE 2.5 MG PO TABS
2.5000 mg | ORAL_TABLET | Freq: Once | ORAL | Status: AC
  Administered 2022-12-08: 2.5 mg via ORAL
  Filled 2022-12-08: qty 1

## 2022-12-08 MED ORDER — FUROSEMIDE 10 MG/ML IJ SOLN
40.0000 mg | Freq: Four times a day (QID) | INTRAMUSCULAR | Status: DC
  Administered 2022-12-08: 40 mg via INTRAVENOUS
  Filled 2022-12-08: qty 4

## 2022-12-08 MED ORDER — POTASSIUM CHLORIDE CRYS ER 20 MEQ PO TBCR
40.0000 meq | EXTENDED_RELEASE_TABLET | Freq: Once | ORAL | Status: AC
Start: 2022-12-08 — End: 2022-12-08
  Administered 2022-12-08: 40 meq via ORAL
  Filled 2022-12-08: qty 2

## 2022-12-08 MED ORDER — AMIODARONE LOAD VIA INFUSION
150.0000 mg | Freq: Once | INTRAVENOUS | Status: AC
  Administered 2022-12-08: 150 mg via INTRAVENOUS
  Filled 2022-12-08: qty 83.34

## 2022-12-08 MED ORDER — MAGNESIUM SULFATE 50 % IJ SOLN
3.0000 g | Freq: Once | INTRAVENOUS | Status: DC
  Filled 2022-12-08: qty 6

## 2022-12-08 NOTE — Progress Notes (Addendum)
eLink Physician-Brief Progress Note Patient Name: Jeremiah Dixon DOB: 04-16-1960 MRN: [id number]   Date of Service  12/08/2022  HPI/Events of Note  Insomnia  eICU Interventions  Trazodone   0606 -known A-fib now on RVR 120-150 on dobutamine 2.5 and amiodarone 30.  Borderline call oximetry at 69%.  Cardiac index of 4.6, cardiac output of 12 L/min.  CVP rising to 13.  Off norepinephrine.  Hold dobutamine for the time being.  Increase amiodarone to 60 mg dosing.  Intervention Category Minor Interventions: Routine modifications to care plan (e.g. PRN medications for pain, fever)  Elener Custodio 12/08/2022, 1:41 AM

## 2022-12-08 NOTE — Progress Notes (Signed)
PHARMACY - ANTICOAGULATION CONSULT NOTE  Pharmacy Consult for heparin Indication: atrial fibrillation, pulmonary embolism  Allergies  Allergen Reactions   Bee Venom Palpitations   Diclofenac Diarrhea   Amoxicillin Diarrhea and Nausea Only   Lisinopril Cough   Pantoprazole Nausea Only    Other Reaction(s): chest pain    Patient Measurements: Height: 6\' 2"  (188 cm) Weight: (!) 139.3 kg (307 lb 1.6 oz) IBW/kg (Calculated) : 82.2 Heparin Dosing Weight: 110kg  Vital Signs: Temp: 100.2 F (37.9 C) (11/09 1900) Temp Source: Core (11/09 1600) BP: 122/100 (11/09 1900) Pulse Rate: 123 (11/09 1900)  Labs: Recent Labs    12/06/22 0302 12/06/22 0319 12/06/22 0548 12/06/22 0827 12/06/22 0941 12/06/22 1107 12/06/22 1214 12/07/22 0353 12/07/22 0500 12/07/22 1355 12/07/22 2107 12/08/22 0329 12/08/22 0553 12/08/22 1226 12/08/22 1724 12/08/22 1814  HGB 11.1*   < > 11.0*   < > 10.3*  --    < > 9.5*  --   --   --  8.9* 9.3*  --   --   --   HCT 35.8*   < > 33.3*   < > 29.2*  --    < > 27.0*  --   --   --  25.8* 27.4*  --   --   --   PLT 211  --  209  --  198  --   --  157  --   --   --  140* 155  --   --   --   APTT 29  --   --   --  33  --   --   --   --   --   --   --   --   --   --   --   LABPROT 15.6*  --   --   --  18.0*  --   --   --   --   --   --   --   --   --   --   --   INR 1.2  --   --   --  1.5*  --   --   --   --   --   --   --   --   --   --   --   HEPARINUNFRC  --   --   --   --   --   --    < >  --    < > 0.24*   < > 0.28*  --  0.35  --  0.39  CREATININE  --   --  1.38*  --  1.51*  --    < > 0.91  --  1.02  --  0.89  --   --  0.85  --   TROPONINIHS  --   --  397*  --  2,059* 2,815*  --   --   --   --   --   --   --   --   --   --    < > = values in this interval not displayed.    Estimated Creatinine Clearance: 133.8 mL/min (by C-G formula based on SCr of 0.85 mg/dL).   Medical History: Past Medical History:  Diagnosis Date   MI (myocardial infarction)  [facility])      Assessment: 62yo male with had PEA arrest on arrival to ED, tenecteplase was given for suspected PE. ROSC was obtained. CXR w/ pulmonary  edema and EKG w/ Afib. No anticoagulation prior to admission. Pharmacy consulted for heparin for afib and possible PE.    Heparin level is therapeutic at 0.39, on heparin infusion at 2200 units/hr. Hgb 9.3, plt 155. No s/sx of bleeding or infusion issues.   Goal of Therapy:  Heparin level 0.3-0.7 units/ml Monitor platelets by anticoagulation protocol: Yes   Plan:  Continue heparin infusion at 2200 units/hr Monitor daily heparin levels, CBC, and signs/symptoms of bleeding  Thank you for allowing pharmacy to participate in this patient's care,  Sherron Monday, PharmD, BCCCP Clinical Pharmacist  Phone: [phone] 12/08/2022 7:52 PM  Please check AMION for all [facility] phone numbers After 10:00 PM, call Main Pharmacy [phone]

## 2022-12-08 NOTE — Progress Notes (Signed)
OT Cancellation Note  Patient Details Name: Jerrald Ting MRN: [id number] DOB: Jun 09, 1960   Cancelled Treatment:    Reason Eval/Treat Not Completed: Medical issues which prohibited therapy. Pt in Afib with RVR this morning with resting HR of 130s-140s on arrival. RN requesting hold for today. Will return as schedule allows.   Tyler Deis, OTR/L [facility] Acute Rehabilitation Office: [phone]   Myrla Halsted 12/08/2022, 11:29 AM

## 2022-12-08 NOTE — Progress Notes (Signed)
NAME:  Jeremiah Dixon, MRN:  [id number], DOB:  02/20/1960, LOS: 2 ADMISSION DATE:  12/06/2022, CONSULTATION DATE:  12/06/2022 REFERRING MD:  Dr. Madilyn Hook, EDP, CHIEF COMPLAINT:  resp arrest    History of Present Illness:  62 year old male with past medical history of CAD, HTN, HLD, prediabetes who presented to the emergency department on 12/06/22 with respiratory arrest. Per EDP, initial complaint of shortness of breath. EMS reported being called out to home for chest pain and difficulty breathing. SpO2 in 60s for EMS. When he arrived to ED, unresponsive, pulseless and CPR was initiated. He was subsequently intubated. TNK was given during arrest out of concern for PE. EDP notes approximately 25 minutes of CPR total. Was started on sedation, epi gtt and PCCM consulted for admission.   Pertinent  Medical History  CAD, HTN, HLD  Significant Hospital Events: Including procedures, antibiotic start and stop dates in addition to other pertinent events   11/7: respiratory distress, hypoxemia subsequent pulselessness and ~30min CPR until ROSC. Admitted to ICU early AM. On AM rounds, awake and alert, however appears to be in cardiogenic shock. He is cold on multiple pressors. Echo with RV/LV dysfunction. Got aline, cvc. Swan placed and was fortunately assuring, he did not need cardiac mechanical support 11/8 decr vent support as able, weaning vasoactive meds   Interim History / Subjective:   Extubated yesterday, doing well off pressors   Objective   Blood pressure 118/78, pulse (!) 133, temperature 100 F (37.8 C), resp. rate 20, height 6\' 2"  (1.88 m), weight (!) 139.3 kg, SpO2 95%. PAP: (20-41)/(11-27) 25/12 CVP:  [0 mmHg-25 mmHg] 12 mmHg CO:  [8.6 L/min-12 L/min] 12 L/min CI:  [3.3 L/min/m2-4.6 L/min/m2] 4.6 L/min/m2  Vent Mode: PRVC FiO2 (%):  [36 %-60 %] 36 % Set Rate:  [28 bmp] 28 bmp Vt Set:  [650 mL] 650 mL PEEP:  [8 cmH20] 8 cmH20 Plateau Pressure:  [23 cmH20] 23 cmH20   Intake/Output  Summary (Last 24 hours) at 12/08/2022 7035 Last data filed at 12/08/2022 0600 Gross per 24 hour  Intake 1732.25 ml  Output 5950 ml  Net -4217.75 ml   Filed Weights   12/07/22 0500 12/08/22 0500 12/08/22 0615  Weight: (!) 141.7 kg (!) 147 kg (!) 139.3 kg    Examination: General: obese, critically ill, in icu HENT: NCAT tracking  Lungs: CTAB , no wheeze  Cardiovascular: rrr s1 s2  Abdomen: obese soft nt nd Extremities: BL LE edema  Neuro: AAOX3 GU: foley   Resolved Hospital Problem list     Assessment & Plan:   Acute hypoxic respiratory failure  L pleural effusion -in setting of ?PE, cardiac arrest, volume overload P Doing well post extubation  Continuing diuresis   PEA arrest   Possible massive PE  Cardiogenic shock Afib, - RVR NSVT  CAD Hx HTN HLD -s/p ROSC with ~25 minutes of down time, rcvd tnk with c/f PE as catalyst, but was too unstable to image. Subsequently cold and on multiple pressors with BiV dysfxn and a D shaped septum  -DVT w/u was neg, PE dx is uncertain. Unlikely that we will be able to surely know. Could be acute HF r/t  fib  P Increased lasix to 40mg  BID  Added one dose metolazone  Repeat lytes this afternoon  Bolus of amiodarone  Increased amio ggt to 60 Suspect would benefit from [facility], ?monday  Lactic acidosis, improved  Hyponatremia -volume overload, HF P Continuing diuresis   Prediabetes  - SSI -  CBG q4   Anemia - PRN CBC  EtOH use  -B vit and micronutrient support  - CIWA if needed, but likely out of window   Best Practice (right click and "Reselect all SmartList Selections" daily)   Diet/type: NPO DVT prophylaxis: systemic heparin GI prophylaxis: H2B Lines: Central line, Arterial Line, and yes and it is still needed Foley:  Yes, and it is still needed Code Status:  full code Last date of multidisciplinary goals of care discussion [attempts to call brother unanswered, will try again later]  Labs   CBC: Recent Labs   Lab 12/06/22 0548 12/06/22 0827 12/06/22 0941 12/06/22 1414 12/06/22 1608 12/07/22 0353 12/08/22 0329 12/08/22 0553  WBC 15.2*  --  14.6*  --   --  6.9 7.9 9.8  HGB 11.0*   < > 10.3* 11.6*  11.2* 10.5* 9.5* 8.9* 9.3*  HCT 33.3*   < > 29.2* 34.0*  33.0* 31.0* 27.0* 25.8* 27.4*  MCV 99.7  --  95.4  --   --  90.0 94.9 96.1  PLT 209  --  198  --   --  157 140* 155   < > = values in this interval not displayed.    Basic Metabolic Panel: Recent Labs  Lab 12/06/22 0548 12/06/22 0827 12/06/22 0945 12/06/22 1214 12/06/22 1414 12/06/22 1608 12/06/22 1932 12/07/22 0353 12/07/22 1355 12/08/22 0329  NA 130*   < >  --  128*   < > 126* 127* 127* 128* 130*  K 3.2*   < >  --  3.2*   < > 3.4* 3.6 3.7 4.7 3.5  CL 91*   < >  --  93*  --   --  91* 93* 96* 94*  CO2 22   < >  --  23  --   --  24 23 24 26   GLUCOSE 205*   < >  --  270*  --   --  231* 142* 118* 94  BUN 10   < >  --  12  --   --  12 9 11 11   CREATININE 1.38*   < >  --  1.43*  --   --  1.12 0.91 1.02 0.89  CALCIUM 7.6*   < >  --  7.3*  --   --  7.1* 7.2* 7.7* 8.1*  MG 1.7  --  1.7  --   --   --  1.7 2.0  --   --   PHOS 6.0*  --   --   --   --   --   --   --   --   --    < > = values in this interval not displayed.   GFR: Estimated Creatinine Clearance: 127.8 mL/min (by C-G formula based on SCr of 0.89 mg/dL). Recent Labs  Lab 12/06/22 0941 12/06/22 1115 12/06/22 1622 12/06/22 1709 12/06/22 1932 12/07/22 0353 12/07/22 0831 12/08/22 0329 12/08/22 0553  WBC 14.6*  --   --   --   --  6.9  --  7.9 9.8  LATICACIDVEN 3.7*   < > 3.5* 3.7* 3.5*  --  1.1  --   --    < > = values in this interval not displayed.    Liver Function Tests: Recent Labs  Lab 12/06/22 0548 12/06/22 0941  AST 236* 201*  ALT 128* 115*  ALKPHOS 85 66  BILITOT 1.0 1.4*  PROT 5.1* 4.8*  ALBUMIN 2.6* 2.6*   No  results for input(s): "LIPASE", "AMYLASE" in the last 168 hours. No results for input(s): "AMMONIA" in the last 168 hours.  ABG     Component Value Date/Time   PHART 7.438 12/06/2022 1608   PCO2ART 29.3 (L) 12/06/2022 1608   PO2ART 98 12/06/2022 1608   HCO3 19.8 (L) 12/06/2022 1608   TCO2 21 (L) 12/06/2022 1608   ACIDBASEDEF 4.0 (H) 12/06/2022 1608   O2SAT 69.1 12/08/2022 0329     Coagulation Profile: Recent Labs  Lab 12/06/22 0302 12/06/22 0941  INR 1.2 1.5*    Cardiac Enzymes: No results for input(s): "CKTOTAL", "CKMB", "CKMBINDEX", "TROPONINI" in the last 168 hours.  HbA1C: Hgb A1c MFr Bld  Date/Time Value Ref Range Status  12/06/2022 05:51 AM 5.6 4.8 - 5.6 % Final    Comment:    (NOTE) Pre diabetes:          5.7%-6.4%  Diabetes:              >6.4%  Glycemic control for   <7.0% adults with diabetes   06/07/2009 09:50 PM 5.7 (H) <5.7 % Final    Comment:    See lab report for associated comment(s)    CBG: Recent Labs  Lab 12/07/22 1628 12/07/22 1944 12/07/22 2316 12/08/22 0334 12/08/22 0739  GLUCAP 121* 96 90 84 116*    This patient is critically ill with multiple organ system failure; which, requires frequent high complexity decision making, assessment, support, evaluation, and titration of therapies. This was completed through the application of advanced monitoring technologies and extensive interpretation of multiple databases. During this encounter critical care time was devoted to patient care services described in this note for 32 minutes.  Josephine Igo, DO [place] [facility] 12/08/2022 8:32 AM

## 2022-12-08 NOTE — Progress Notes (Signed)
Patient ID: Jeremiah Dixon, male   DOB: [birth year], 62 y.o.   MRN: [id number]     Advanced Heart Failure Rounding Note  PCP-Cardiologist: None   Subjective:    Extubated 11/8, now on 2L [place]. Off dobutamine, NE, and vasopressin.  Co-ox 69%.  SBP 140s.     Good diuresis yesterday, I/Os net negative 4179, weight down.    Tm 100.8, WBCs 9.8.  On empiric ceftriaxone for ?PNA.   He went back into atrial fibrillation with RVR 120s-130s this morning.  He is now on amiodarone gtt 60 mg/hr and heparin gtt.   Swan  CVP 13-14 PAP 42/30 CI 3.3 Co-ox 69%  Objective:   Weight Range: (!) 139.3 kg Body mass index is 39.43 kg/m.   Vital Signs:   Temp:  [97.7 F (36.5 C)-101.5 F (38.6 C)] 100.8 F (38.2 C) (11/09 0830) Pulse Rate:  [68-167] 122 (11/09 0830) Resp:  [11-28] 27 (11/09 0830) BP: (95-136)/(54-82) 112/82 (11/09 0830) SpO2:  [92 %-100 %] 95 % (11/09 0830) Arterial Line BP: (77-164)/(39-75) 111/57 (11/09 0830) FiO2 (%):  [36 %-40 %] 36 % (11/08 1257) Weight:  [139.3 kg-147 kg] 139.3 kg (11/09 0615) Last BM Date : 12/06/22  Weight change: Filed Weights   12/07/22 0500 12/08/22 0500 12/08/22 0615  Weight: (!) 141.7 kg (!) 147 kg (!) 139.3 kg    Intake/Output:   Intake/Output Summary (Last 24 hours) at 12/08/2022 0928 Last data filed at 12/08/2022 0800 Gross per 24 hour  Intake 1716.16 ml  Output 5975 ml  Net -4258.84 ml      Physical Exam    CVP 13-14  General: NAD Neck: JVP 12-14 cm, no thyromegaly or thyroid nodule.  Lungs: Clear to auscultation bilaterally with normal respiratory effort. CV: Nondisplaced PMI.  Heart tachy, irregular S1/S2, no S3/S4, no murmur.  2+ edema to knees.  Abdomen: Soft, nontender, no hepatosplenomegaly, no distention.  Skin: Intact without lesions or rashes.  Neurologic: Alert and oriented x 3.  Psych: Normal affect. Extremities: No clubbing or cyanosis.  HEENT: Normal.    Telemetry   Atrial fibrillation 120s-130s.     EKG    No new EKG to review   Labs    CBC Recent Labs    12/08/22 0329 12/08/22 0553  WBC 7.9 9.8  HGB 8.9* 9.3*  HCT 25.8* 27.4*  MCV 94.9 96.1  PLT 140* 155   Basic Metabolic Panel Recent Labs    84/69/62 0548 12/06/22 0827 12/06/22 1932 12/07/22 0353 12/07/22 1355 12/08/22 0329  NA 130*   < > 127* 127* 128* 130*  K 3.2*   < > 3.6 3.7 4.7 3.5  CL 91*   < > 91* 93* 96* 94*  CO2 22   < > 24 23 24 26   GLUCOSE 205*   < > 231* 142* 118* 94  BUN 10   < > 12 9 11 11   CREATININE 1.38*   < > 1.12 0.91 1.02 0.89  CALCIUM 7.6*   < > 7.1* 7.2* 7.7* 8.1*  MG 1.7   < > 1.7 2.0  --   --   PHOS 6.0*  --   --   --   --   --    < > = values in this interval not displayed.   Liver Function Tests Recent Labs    12/06/22 0548 12/06/22 0941  AST 236* 201*  ALT 128* 115*  ALKPHOS 85 66  BILITOT 1.0 1.4*  PROT 5.1* 4.8*  ALBUMIN 2.6* 2.6*   No results for input(s): "LIPASE", "AMYLASE" in the last 72 hours. Cardiac Enzymes No results for input(s): "CKTOTAL", "CKMB", "CKMBINDEX", "TROPONINI" in the last 72 hours.  BNP: BNP (last 3 results) Recent Labs    12/06/22 0551  BNP 402.8*    ProBNP (last 3 results) No results for input(s): "PROBNP" in the last 8760 hours.   D-Dimer No results for input(s): "DDIMER" in the last 72 hours. Hemoglobin A1C Recent Labs    12/06/22 0551  HGBA1C 5.6   Fasting Lipid Panel Recent Labs    12/07/22 0353  TRIG 73   Thyroid Function Tests Recent Labs    12/06/22 0551  TSH 6.918*    Other results:   Imaging    DG CHEST PORT 1 VIEW  Result Date: 12/07/2022 CLINICAL DATA:  CHF EXAM: PORTABLE CHEST 1 VIEW COMPARISON:  X-ray 12/06/2022. FINDINGS: Stable ET tube, enteric tube and left IJ catheter. Placement of a right IJ catheter now seen with the tip in the right hilum, Swan-Ganz catheter. Overlapping cardiac leads. Defibrillator pads. Stable cardiopericardial silhouette. Persistent left retrocardiac opacity. Small left  effusion. No pneumothorax identified. IMPRESSION: Interval placement of a right IJ Swan-Ganz catheter with tip in the right lung hilum on this x-ray. Please correlate for utilization of the Swan at the time of the x-ray. No pneumothorax. Electronically Signed   By: Karen Kays M.D.   On: 12/07/2022 14:51     Medications:     Scheduled Medications:  aspirin  81 mg Oral Daily   Chlorhexidine Gluconate Cloth  6 each Topical Daily   digoxin  0.125 mg Oral Daily   famotidine  20 mg Oral BID   folic acid  1 mg Oral Daily   furosemide  80 mg Intravenous Once   insulin aspart  0-15 Units Subcutaneous Q4H   losartan  12.5 mg Oral Daily   multivitamin with minerals  1 tablet Oral Daily   polyethylene glycol  17 g Oral Daily   rosuvastatin  20 mg Oral Daily   spironolactone  12.5 mg Oral Daily   thiamine  100 mg Oral Daily   Or   thiamine  100 mg Intravenous Daily    Infusions:  amiodarone 60 mg/hr (12/08/22 0907)   cefTRIAXone (ROCEPHIN)  IV Stopped (12/07/22 1430)   epinephrine Stopped (12/07/22 0849)   heparin 2,000 Units/hr (12/08/22 0800)   norepinephrine (LEVOPHED) Adult infusion Stopped (12/08/22 0117)   vasopressin Stopped (12/07/22 1646)    PRN Medications: acetaminophen, LORazepam **OR** LORazepam, mouth rinse, traZODone    Patient Profile   [name] is a 62 year old male with a history of CAD, HTN,  and HLD.    Presented w/ CP and had PEA arrest in the ED. ROSC ~ 25 minutes. TNK given for presumed PE. AHF team consulted for cardiogenic shock.   Assessment/Plan   1. Acute systolic CHF/Cardiogenic shock: Shock now resolved.  S/p PEA arrest 11/7.  Received tenecteplase in ER given concern for massive PE in setting of PEA arrest but could this all be CHF with new AF/RVR?  HS-TnI rose to 2815 post- arrest.   I reviewed echo at bedside (somewhat difficult study) with LV EF difficult to quantify but looks in 35% range, RV moderately dilated/moderately dysfunctional with  D-shaped septum, IVC dilated.  Extubated on 11/8 and now off all pressors with CI 3.3 by thermodilution and co-ox 69%.  Ernestine Conrad initially with poor waveform this morning but was able to adjust and  correct. SBP 140s today, creatinine 0.89, CVP 13-14.  - Can stay off inotropes/pressors.   - Has had Lasix 40 mg IV x 1 with metolazone 2.5 this morning, will give another Lasix 80 mg IV this afternoon.  Replace K and repeat BMET in pm.  - Start digoxin 0.125 - Start spironolactone 12.5 daily - Start losartan 12.5 daily.  If BP/creatinine remain stable, transition to McNeil.  - Repeat echo when HR under better control now that he is extubated for better LV/RV assessment.  - Unna boots for LEE - Should have eventual LHC/RHC.  2. CAD: Known occlusion of OM1 in 2007.  HS-TnI 397 => 2815 post arrest, no STEMI on ECG.  Has received tenecteplase.  - Continue ASA/statin.  - Should get eventual cath as above.  3. 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  Has had tenecteplase.  Lower extremity dopplers with no DVT.  Will be hard to definitively confirm PE diagnosis at this point, but will get long-term anticoagulation due to AF.  4. Atrial fibrillation: With RVR on admission, no prior history.  Converted to NSR on amiodarone initially but back in AF with RVR this morning.  - Bolus amiodarone and increase gtt to 60 mg/hr.  - Cont heparin gtt - Start digoxin as above.  - TEE-guided DCCV if does not convert on his own.  5. Acute hypoxemic respiratory failure: Resolved, extubated.  On 2L [place].  6. ETOH abuse: Per brother, "1/2 gallon beer" daily.  7. Hyponatremia: Hypervolemic hyponatremia.  - Fluid restrict.  8. ID: Low grade fever to 100.8.  WBCs normal. - Continue empiric ceftriaxone for ?PNA.   Length of Stay: 2  Marca Ancona, MD  12/08/2022, 9:28 AM  Advanced Heart Failure Team Pager [phone] (M-F; 7a - 5p)  Please contact [facility] for night-coverage after hours (5p -7a ) and  weekends on [email or web address]  CRITICAL CARE Performed by: Marca Ancona  Total critical care time: 40 minutes  Critical care time was exclusive of separately billable procedures and treating other patients.  Critical care was necessary to treat or prevent imminent or life-threatening deterioration.  Critical care was time spent personally by me on the following activities: development of treatment plan with patient and/or surrogate as well as nursing, discussions with consultants, evaluation of patient's response to treatment, examination of patient, obtaining history from patient or surrogate, ordering and performing treatments and interventions, ordering and review of laboratory studies, ordering and review of radiographic studies, pulse oximetry and re-evaluation of patient's condition.  Marca Ancona 12/08/2022 9:28 AM

## 2022-12-08 NOTE — Progress Notes (Signed)
[facility] ADULT ICU REPLACEMENT PROTOCOL   The patient does apply for the [facility] Adult ICU Electrolyte Replacment Protocol based on the criteria listed below:   1.Exclusion criteria: TCTS, ECMO, Dialysis, and Myasthenia Gravis patients 2. Is GFR >/= 30 ml/min? Yes.    Patient's GFR today is > 60 3. Is SCr </= 2? Yes.   Patient's SCr is 0.89 mg/dL 4. Did SCr increase >/= 0.5 in 24 hours? No. 5.Pt's weight >40kg  Yes.   6. Abnormal electrolyte(s): K+ Dr Catarina Hartshorn  7. Electrolytes replaced per protocol 8.  Call MD STAT for K+ </= 2.5, Phos </= 1, or Mag </= 1 Physician:  Dr Rhodia Albright, Silas Holsapple 12/08/2022 6:44 AM

## 2022-12-08 NOTE — Progress Notes (Signed)
eLink Physician-Brief Progress Note Patient Name: Jeremiah Dixon DOB: Jun 02, 1960 MRN: [id number]   Date of Service  12/08/2022  HPI/Events of Note  62 year old male with past medical history of CAD, HTN, HLD, prediabetes who presented to the emergency department on 12/06/22 with respiratory arrest.   K3.5.  Received 80 mEq of potassium p.o.  Received one-time dose of diuretic.  eICU Interventions  Will replete as needed with a.m. labs.     Intervention Category Minor Interventions: Electrolytes abnormality - evaluation and management  Trevel Dillenbeck 12/08/2022, 9:28 PM

## 2022-12-08 NOTE — Progress Notes (Signed)
PHARMACY - ANTICOAGULATION CONSULT NOTE  Pharmacy Consult for heparin Indication: atrial fibrillation, pulmonary embolism  Allergies  Allergen Reactions   Bee Venom Palpitations   Diclofenac Diarrhea   Amoxicillin Diarrhea and Nausea Only   Lisinopril Cough   Pantoprazole Nausea Only    Other Reaction(s): chest pain    Patient Measurements: Height: 6\' 2"  (188 cm) Weight: (!) 139.3 kg (307 lb 1.6 oz) IBW/kg (Calculated) : 82.2 Heparin Dosing Weight: 110kg  Vital Signs: Temp: 100.4 F (38 C) (11/09 1245) Temp Source: Core (11/09 1200) BP: 102/56 (11/09 1245) Pulse Rate: 130 (11/09 1245)  Labs: Recent Labs    12/06/22 0302 12/06/22 0319 12/06/22 0548 12/06/22 0827 12/06/22 0941 12/06/22 1107 12/06/22 1214 12/07/22 0353 12/07/22 0500 12/07/22 1355 12/07/22 2107 12/08/22 0329 12/08/22 0553 12/08/22 1226  HGB 11.1*   < > 11.0*   < > 10.3*  --    < > 9.5*  --   --   --  8.9* 9.3*  --   HCT 35.8*   < > 33.3*   < > 29.2*  --    < > 27.0*  --   --   --  25.8* 27.4*  --   PLT 211  --  209  --  198  --   --  157  --   --   --  140* 155  --   APTT 29  --   --   --  33  --   --   --   --   --   --   --   --   --   LABPROT 15.6*  --   --   --  18.0*  --   --   --   --   --   --   --   --   --   INR 1.2  --   --   --  1.5*  --   --   --   --   --   --   --   --   --   HEPARINUNFRC  --   --   --   --   --   --    < >  --    < > 0.24* 0.37 0.28*  --  0.35  CREATININE  --   --  1.38*  --  1.51*  --    < > 0.91  --  1.02  --  0.89  --   --   TROPONINIHS  --   --  397*  --  2,059* 2,815*  --   --   --   --   --   --   --   --    < > = values in this interval not displayed.    Estimated Creatinine Clearance: 127.8 mL/min (by C-G formula based on SCr of 0.89 mg/dL).   Medical History: Past Medical History:  Diagnosis Date   MI (myocardial infarction) [facility])      Assessment: 62yo male with had PEA arrest on arrival to ED, tenecteplase was given for suspected PE. ROSC was  obtained. CXR w/ pulmonary edema and EKG w/ Afib. No anticoagulation prior to admission. Pharmacy consulted for heparin for afib and possible PE.    Heparin level is on lower end of therapeutic range at 0.35, on heparin infusion at 2000 units/hr. Hgb 9.3, plt 155. No s/sx of bleeding or infusion issues.   Goal of Therapy:  Heparin  level 0.3-0.7 units/ml Monitor platelets by anticoagulation protocol: Yes   Plan:  Increase heparin infusion to 2200 units/hr Monitor daily heparin levels, CBC, and signs/symptoms of bleeding  Thank you for allowing pharmacy to participate in this patient's care,  Wilmer Floor, PharmD PGY2 Cardiology Pharmacy Resident Phone: [phone] 12/08/2022 1:11 PM  Please check AMION for all [facility] Pharmacy phone numbers After 10:00 PM, call Main Pharmacy [phone]

## 2022-12-08 NOTE — Progress Notes (Addendum)
Nurse gave patient a bath around 0530. Upon entering the room the nurse discovered the CVP port had been disconnected. A moderate amount of blood on patient's sheets. VSS. CBC sent and MD notified.   Patient stated last drink was 11/6 and has "two 40's" per day. CIWA 11.   Around 6578 Patient went into Afib RVR. MD notified.

## 2022-12-08 NOTE — Progress Notes (Signed)
PHARMACY - PHYSICIAN COMMUNICATION CRITICAL VALUE ALERT - BLOOD CULTURE IDENTIFICATION (BCID)  Jeremiah Dixon is an 62 y.o. male who presented to [facility] on 12/06/2022 with a chief complaint of cardiac arrest.   Assessment:  Single blood culture positive for Gram positive rods.   Name of physician (or Provider) Contacted: Dr. Tonia Brooms.   Current antibiotics: Ceftriaxone  Changes to prescribed antibiotics recommended:  No changes to current antimicrobials per provider discussion   No results found for this or any previous visit.  Jeremiah Dixon 12/08/2022  10:34 AM

## 2022-12-09 DIAGNOSIS — I469 Cardiac arrest, cause unspecified: Secondary | ICD-10-CM | POA: Diagnosis not present

## 2022-12-09 LAB — GLUCOSE, CAPILLARY
Glucose-Capillary: 127 mg/dL — ABNORMAL HIGH (ref 70–99)
Glucose-Capillary: 118 mg/dL — ABNORMAL HIGH (ref 70–99)
Glucose-Capillary: 115 mg/dL — ABNORMAL HIGH (ref 70–99)
Glucose-Capillary: 127 mg/dL — ABNORMAL HIGH (ref 70–99)
Glucose-Capillary: 143 mg/dL — ABNORMAL HIGH (ref 70–99)

## 2022-12-09 LAB — BASIC METABOLIC PANEL
Anion gap: 10 (ref 5–15)
Anion gap: 9 (ref 5–15)
Anion gap: 7 (ref 5–15)
BUN: 7 mg/dL — ABNORMAL LOW (ref 8–23)
BUN: 7 mg/dL — ABNORMAL LOW (ref 8–23)
BUN: 9 mg/dL (ref 8–23)
CO2: 26 mmol/L (ref 22–32)
CO2: 27 mmol/L (ref 22–32)
CO2: 30 mmol/L (ref 22–32)
Calcium: 7.9 mg/dL — ABNORMAL LOW (ref 8.9–10.3)
Calcium: 7.9 mg/dL — ABNORMAL LOW (ref 8.9–10.3)
Calcium: 8 mg/dL — ABNORMAL LOW (ref 8.9–10.3)
Chloride: 88 mmol/L — ABNORMAL LOW (ref 98–111)
Chloride: 87 mmol/L — ABNORMAL LOW (ref 98–111)
Chloride: 89 mmol/L — ABNORMAL LOW (ref 98–111)
Creatinine, Ser: 0.83 mg/dL (ref 0.61–1.24)
Creatinine, Ser: 0.72 mg/dL (ref 0.61–1.24)
Creatinine, Ser: 0.86 mg/dL (ref 0.61–1.24)
GFR, Estimated: >60 mL/min (ref >60)
GFR, Estimated: >60 mL/min (ref >60)
GFR, Estimated: >60 mL/min (ref >60)
Glucose, Bld: 121 mg/dL — ABNORMAL HIGH (ref 70–99)
Glucose, Bld: 124 mg/dL — ABNORMAL HIGH (ref 70–99)
Glucose, Bld: 139 mg/dL — ABNORMAL HIGH (ref 70–99)
Potassium: 3.4 mmol/L — ABNORMAL LOW (ref 3.5–5.1)
Potassium: 3.7 mmol/L (ref 3.5–5.1)
Potassium: 4.3 mmol/L (ref 3.5–5.1)
Sodium: 124 mmol/L — ABNORMAL LOW (ref 135–145)
Sodium: 123 mmol/L — ABNORMAL LOW (ref 135–145)
Sodium: 126 mmol/L — ABNORMAL LOW (ref 135–145)

## 2022-12-09 LAB — COOXEMETRY PANEL
Carboxyhemoglobin: 2 % — ABNORMAL HIGH (ref 0.5–1.5)
Methemoglobin: <0.7 % (ref 0.0–1.5)
O2 Saturation: 61.4 %
Total hemoglobin: 9.2 g/dL — ABNORMAL LOW (ref 12.0–16.0)

## 2022-12-09 LAB — HEPATIC FUNCTION PANEL
ALT: 47 U/L — ABNORMAL HIGH (ref 0–44)
AST: 50 U/L — ABNORMAL HIGH (ref 15–41)
Albumin: 2.5 g/dL — ABNORMAL LOW (ref 3.5–5.0)
Alkaline Phosphatase: 56 U/L (ref 38–126)
Bilirubin, Direct: 0.2 mg/dL (ref 0.0–0.2)
Indirect Bilirubin: 1 mg/dL — ABNORMAL HIGH (ref 0.3–0.9)
Total Bilirubin: 1.2 mg/dL — ABNORMAL HIGH (ref <1.2)
Total Protein: 5.5 g/dL — ABNORMAL LOW (ref 6.5–8.1)

## 2022-12-09 LAB — CBC
HCT: 25.3 % — ABNORMAL LOW (ref 39.0–52.0)
Hemoglobin: 8.7 g/dL — ABNORMAL LOW (ref 13.0–17.0)
MCH: 32.2 pg (ref 26.0–34.0)
MCHC: 34.4 g/dL (ref 30.0–36.0)
MCV: 93.7 fL (ref 80.0–100.0)
Platelets: 148 10*3/uL — ABNORMAL LOW (ref 150–400)
RBC: 2.7 MIL/uL — ABNORMAL LOW (ref 4.22–5.81)
RDW: 12.6 % (ref 11.5–15.5)
WBC: 8.9 10*3/uL (ref 4.0–10.5)
nRBC: 0 % (ref 0.0–0.2)

## 2022-12-09 LAB — HEPARIN LEVEL (UNFRACTIONATED): Heparin Unfractionated: 0.38 [IU]/mL (ref 0.30–0.70)

## 2022-12-09 LAB — MAGNESIUM: Magnesium: 1.6 mg/dL — ABNORMAL LOW (ref 1.7–2.4)

## 2022-12-09 MED ORDER — ASPIRIN 81 MG PO CHEW
81.0000 mg | CHEWABLE_TABLET | ORAL | Status: AC
  Administered 2022-12-10: 81 mg via ORAL
  Filled 2022-12-09: qty 1

## 2022-12-09 MED ORDER — POTASSIUM CHLORIDE CRYS ER 20 MEQ PO TBCR: 40.0000 meq | EXTENDED_RELEASE_TABLET | ORAL | Status: DC

## 2022-12-09 MED ORDER — SODIUM CHLORIDE 0.9 % WEIGHT BASED INFUSION
1.0000 mL/kg/h | INTRAVENOUS | Status: DC
  Administered 2022-12-10 (×2): 1 mL/kg/h via INTRAVENOUS

## 2022-12-09 MED ORDER — POTASSIUM CHLORIDE CRYS ER 20 MEQ PO TBCR
40.0000 meq | EXTENDED_RELEASE_TABLET | Freq: Once | ORAL | Status: AC
  Administered 2022-12-09: 40 meq via ORAL
  Filled 2022-12-09: qty 2

## 2022-12-09 MED ORDER — LOSARTAN POTASSIUM 25 MG PO TABS
25.0000 mg | ORAL_TABLET | Freq: Every day | ORAL | Status: DC
  Administered 2022-12-09 – 2022-12-19 (×11): 25 mg via ORAL
  Filled 2022-12-09 (×10): qty 1

## 2022-12-09 MED ORDER — FUROSEMIDE 10 MG/ML IJ SOLN
80.0000 mg | Freq: Two times a day (BID) | INTRAMUSCULAR | Status: DC
  Administered 2022-12-09 – 2022-12-10 (×4): 80 mg via INTRAVENOUS
  Filled 2022-12-09 (×4): qty 8

## 2022-12-09 MED ORDER — OXYCODONE HCL 5 MG PO TABS
5.0000 mg | ORAL_TABLET | Freq: Four times a day (QID) | ORAL | Status: DC | PRN
  Administered 2022-12-09 – 2022-12-16 (×11): 5 mg via ORAL
  Filled 2022-12-09 (×11): qty 1

## 2022-12-09 MED ORDER — LIDOCAINE 5 % EX PTCH
1.0000 | MEDICATED_PATCH | CUTANEOUS | Status: DC
Start: 2022-12-09 — End: 2022-12-17
  Administered 2022-12-09 – 2022-12-16 (×4): 1 via TRANSDERMAL
  Filled 2022-12-09 (×8): qty 1

## 2022-12-09 MED ORDER — POTASSIUM CHLORIDE CRYS ER 20 MEQ PO TBCR
40.0000 meq | EXTENDED_RELEASE_TABLET | ORAL | Status: DC
  Administered 2022-12-09 (×3): 40 meq via ORAL
  Filled 2022-12-09 (×3): qty 2

## 2022-12-09 MED ORDER — TOLVAPTAN 15 MG PO TABS
15.0000 mg | ORAL_TABLET | Freq: Once | ORAL | Status: AC
  Administered 2022-12-09: 15 mg via ORAL
  Filled 2022-12-09: qty 1

## 2022-12-09 MED ORDER — GUAIFENESIN ER 600 MG PO TB12
600.0000 mg | ORAL_TABLET | Freq: Two times a day (BID) | ORAL | Status: DC | PRN
  Administered 2022-12-09 – 2022-12-10 (×2): 600 mg via ORAL
  Filled 2022-12-09 (×3): qty 1

## 2022-12-09 MED ORDER — SODIUM CHLORIDE 0.9 % WEIGHT BASED INFUSION
3.0000 mL/kg/h | INTRAVENOUS | Status: DC
  Administered 2022-12-10: 3 mL/kg/h via INTRAVENOUS

## 2022-12-09 NOTE — Evaluation (Signed)
Occupational Therapy Evaluation Patient Details Name: Jeremiah Dixon MRN: [id number] DOB: 1960/12/07 Today's Date: 12/09/2022   History of Present Illness Pt is a 62 y/o male presenting to the ED on 11/7 with respiratory arrest.  EMS reported SpO2 in the 60's on RA.  In ED became unresponsive, pulseless and CPR initiated.  Pt intubated.  TNK given for PE concern.  25 min CPR total.  Pt extubated 11/8.   PMHx  CAD, HTN   Clinical Impression   PTA, pt lived alone and was independent. Upon eval, pt presents with decreased cardiopulmonary status, activity tolerance, strength, balance, awareness, memory, and problem solving. Pt oriented, but with intermittent confusion after transfer to chair, stating "is that my bed". Pt needing significant cueing and increased time for transfers this session as well as light assist. At this time, pt would benefit from inpatient rehabilitation but hopeful to progress home.        If plan is discharge home, recommend the following: A little help with walking and/or transfers;A little help with bathing/dressing/bathroom;Assistance with cooking/housework;Assist for transportation;Help with stairs or ramp for entrance;Supervision due to cognitive status    Functional Status Assessment  Patient has had a recent decline in their functional status and demonstrates the ability to make significant improvements in function in a reasonable and predictable amount of time.  Equipment Recommendations  Other (comment) (defer)    Recommendations for Other Services Speech consult     Precautions / Restrictions Precautions Precautions: Fall Restrictions Weight Bearing Restrictions: No      Mobility Bed Mobility Overal bed mobility: Needs Assistance Bed Mobility: Supine to Sit     Supine to sit: Min assist     General bed mobility comments: for truncal elevation    Transfers Overall transfer level: Needs assistance Equipment used: None, 1 person hand  held assist Transfers: Sit to/from Stand, Bed to chair/wheelchair/BSC Sit to Stand: Min assist     Step pivot transfers: Min assist     General transfer comment: Cues for safety and sequence throughout. Pt needing cues for sequencing of steps reporting his feet feel heavy. Light steadying assist      Balance Overall balance assessment: Needs assistance Sitting-balance support: No upper extremity supported, Feet supported Sitting balance-Leahy Scale: Fair       Standing balance-Leahy Scale: Poor                             ADL either performed or assessed with clinical judgement   ADL Overall ADL's : Needs assistance/impaired Eating/Feeding: Modified independent   Grooming: Set up;Sitting   Upper Body Bathing: Contact guard assist;Sitting   Lower Body Bathing: Sit to/from stand;Moderate assistance   Upper Body Dressing : Contact guard assist;Sitting   Lower Body Dressing: Maximal assistance;Sit to/from stand   Toilet Transfer: Minimal Cabin crew Details (indicate cue type and reason): 1 person HHA         Functional mobility during ADLs: Minimal assistance General ADL Comments: frequent cues for sequencing and safety during transfer     Vision Patient Visual Report: No change from baseline Vision Assessment?: No apparent visual deficits     Perception Perception: Not tested       Praxis Praxis: Not tested       Pertinent Vitals/Pain Pain Assessment Pain Assessment: Faces Faces Pain Scale: No hurt Pain Intervention(s): Limited activity within patient's tolerance, Monitored during session     Extremity/Trunk Assessment Upper Extremity Assessment  Upper Extremity Assessment: Generalized weakness   Lower Extremity Assessment Lower Extremity Assessment: Generalized weakness       Communication Communication Communication: No apparent difficulties   Cognition Arousal: Alert Behavior During Therapy: WFL for  tasks assessed/performed Overall Cognitive Status: Impaired/Different from baseline Area of Impairment: Safety/judgement, Awareness, Problem solving, Memory, Attention, Following commands                   Current Attention Level: Focused, Sustained Memory: Decreased short-term memory Following Commands: Follows one step commands consistently, Follows one step commands with increased time, Follows multi-step commands inconsistently Safety/Judgement: Decreased awareness of safety, Decreased awareness of deficits Awareness: Intellectual, Emergent (has some understanding that he "has a long road ahead of him" to recover but then difficulty linking his medical concerns with physical ability during mobility. Also transfers from bed to chair and then states "is that my bed") Problem Solving: Slow processing, Requires verbal cues, Difficulty sequencing General Comments: Oriented and able to answer basic questions about PLOF. Working memory more difficult.     General Comments  HR 100 on arrival and up to 140 with SPT to chair    Exercises     Shoulder Instructions      Home Living Family/patient expects to be discharged to:: Private residence Living Arrangements: Alone (PRN assist availavble from brother) Available Help at Discharge: Family;Friend(s);Available PRN/intermittently Type of Home: Apartment Home Access: Stairs to enter Entrance Stairs-Number of Steps: flight   Home Layout: Other (Comment) (1 level on the second floor)     Bathroom Shower/Tub: Tub/shower unit;Curtain   Bathroom Toilet: Standard     Home Equipment: None          Prior Functioning/Environment Prior Level of Function : Independent/Modified Independent;Working/employed;Driving (works at [employer])                        OT Problem List: Decreased strength;Decreased activity tolerance;Impaired balance (sitting and/or standing);Decreased cognition;Decreased safety awareness;Decreased  knowledge of precautions;Decreased knowledge of use of DME or AE;Cardiopulmonary status limiting activity      OT Treatment/Interventions: Self-care/ADL training;Therapeutic exercise;DME and/or AE instruction;Patient/family education;Balance training;Therapeutic activities;Cognitive remediation/compensation    OT Goals(Current goals can be found in the care plan section) Acute Rehab OT Goals Patient Stated Goal: get better OT Goal Formulation: With patient Time For Goal Achievement: 12/23/22 Potential to Achieve Goals: Good  OT Frequency: Min 1X/week    Co-evaluation              AM-PAC OT "6 Clicks" Daily Activity     Outcome Measure Help from another person eating meals?: None Help from another person taking care of personal grooming?: A Little Help from another person toileting, which includes using toliet, bedpan, or urinal?: A Lot Help from another person bathing (including washing, rinsing, drying)?: A Lot Help from another person to put on and taking off regular upper body clothing?: A Little Help from another person to put on and taking off regular lower body clothing?: A Lot 6 Click Score: 16   End of Session Equipment Utilized During Treatment: Gait belt Nurse Communication: Mobility status  Activity Tolerance: Patient tolerated treatment well Patient left: in chair;with call bell/phone within reach;with chair alarm set;with nursing/sitter in room  OT Visit Diagnosis: Unsteadiness on feet (R26.81);Muscle weakness (generalized) (M62.81);Other symptoms and signs involving cognitive function;Other abnormalities of gait and mobility (R26.89)                Time: 1610-9604 OT Time Calculation (  min): 31 min Charges:  OT General Charges $OT Visit: 1 Visit OT Evaluation $OT Eval Moderate Complexity: 1 Mod OT Treatments $Self Care/Home Management : 8-22 mins  Tyler Deis, OTR/L [facility] Acute Rehabilitation Office: [phone]   Myrla Halsted 12/09/2022,  5:01 PM

## 2022-12-09 NOTE — Plan of Care (Signed)
  Problem: Fluid Volume: Goal: Ability to maintain a balanced intake and output will improve Outcome: Progressing   Problem: Health Behavior/Discharge Planning: Goal: Ability to identify and utilize available resources and services will improve Outcome: Progressing   Problem: Metabolic: Goal: Ability to maintain appropriate glucose levels will improve Outcome: Progressing

## 2022-12-09 NOTE — Progress Notes (Signed)
Patient ID: Jeremiah Dixon, male   DOB: 1960/05/03, 62 y.o.   MRN: [id number]     Advanced Heart Failure Rounding Note  PCP-Cardiologist: None   Subjective:    Extubated 11/8, now on 2L [place]. Off dobutamine, NE, and vasopressin.  SBP 100s.      Good diuresis yesterday, I/Os net negative 4010, not weighed yet.  Na lower at 124 today.   Tm 100.8, WBCs 8.9.  On empiric ceftriaxone for ?PNA.   He remains in AF with RVR 120s.  He is now on amiodarone gtt 60 mg/hr and heparin gtt.   Still coughing and orthopneic.   Swan  CVP 13-14 PAP 26/15 CI 2.37 Co-ox 61%  Objective:   Weight Range: (!) 139.3 kg Body mass index is 39.43 kg/m.   Vital Signs:   Temp:  [98.6 F (37 C)-100.8 F (38.2 C)] 99.7 F (37.6 C) (11/10 0800) Pulse Rate:  [96-144] 128 (11/10 0900) Resp:  [16-28] 21 (11/10 0900) BP: (98-126)/(56-100) 125/78 (11/10 0900) SpO2:  [90 %-100 %] 97 % (11/10 0900) Arterial Line BP: (80-313)/(56-104) 106/91 (11/10 0900) Last BM Date : 12/06/22  Weight change: Filed Weights   12/07/22 0500 12/08/22 0500 12/08/22 0615  Weight: (!) 141.7 kg (!) 147 kg (!) 139.3 kg    Intake/Output:   Intake/Output Summary (Last 24 hours) at 12/09/2022 0915 Last data filed at 12/09/2022 0800 Gross per 24 hour  Intake 1764.2 ml  Output 5550 ml  Net -3785.8 ml      Physical Exam    CVP 13-14  General: NAD Neck: JVP 14 cm, no thyromegaly or thyroid nodule.  Lungs: Decreased at bases.  CV: Nondisplaced PMI.  Heart tachy, irregular S1/S2, no S3/S4, no murmur.  1+ edema to knees.   Abdomen: Soft, nontender, no hepatosplenomegaly, no distention.  Skin: Intact without lesions or rashes.  Neurologic: Alert and oriented x 3.  Psych: Normal affect. Extremities: No clubbing or cyanosis.  HEENT: Normal.    Telemetry   Atrial fibrillation 120s.    EKG    No new EKG to review   Labs    CBC Recent Labs    12/08/22 0553 12/09/22 0459  WBC 9.8 8.9  HGB 9.3* 8.7*  HCT  27.4* 25.3*  MCV 96.1 93.7  PLT 155 148*   Basic Metabolic Panel Recent Labs    91/47/82 0353 12/07/22 1355 12/08/22 0943 12/08/22 1724 12/09/22 0459  NA 127*   < >  --  124* 124*  K 3.7   < >  --  3.5 3.4*  CL 93*   < >  --  90* 88*  CO2 23   < >  --  25 26  GLUCOSE 142*   < >  --  142* 121*  BUN 9   < >  --  8 7*  CREATININE 0.91   < >  --  0.85 0.83  CALCIUM 7.2*   < >  --  7.8* 7.9*  MG 2.0  --  1.6*  --   --    < > = values in this interval not displayed.   Liver Function Tests Recent Labs    12/06/22 0941  AST 201*  ALT 115*  ALKPHOS 66  BILITOT 1.4*  PROT 4.8*  ALBUMIN 2.6*   No results for input(s): "LIPASE", "AMYLASE" in the last 72 hours. Cardiac Enzymes No results for input(s): "CKTOTAL", "CKMB", "CKMBINDEX", "TROPONINI" in the last 72 hours.  BNP: BNP (last 3 results) Recent  Labs    12/06/22 0551  BNP 402.8*    ProBNP (last 3 results) No results for input(s): "PROBNP" in the last 8760 hours.   D-Dimer No results for input(s): "DDIMER" in the last 72 hours. Hemoglobin A1C No results for input(s): "HGBA1C" in the last 72 hours.  Fasting Lipid Panel Recent Labs    12/07/22 0353  TRIG 73   Thyroid Function Tests No results for input(s): "TSH", "T4TOTAL", "T3FREE", "THYROIDAB" in the last 72 hours.  Invalid input(s): "FREET3"   Other results:   Imaging    DG CHEST PORT 1 VIEW  Result Date: 12/08/2022 CLINICAL DATA:  Congestive heart failure. EXAM: PORTABLE CHEST 1 VIEW COMPARISON:  December 07, 2022. FINDINGS: Stable cardiomediastinal silhouette. Endotracheal and nasogastric tubes have been removed. Left internal jugular catheter and right internal jugular Swan-Ganz catheter are unchanged. Right lung is clear. Mild left pleural effusion is noted with associated left basilar atelectasis or pneumonia. Bony thorax is unremarkable. IMPRESSION: Left basilar atelectasis or pneumonia with associated pleural effusion. Electronically Signed    By: Lupita Raider M.D.   On: 12/08/2022 14:06     Medications:     Scheduled Medications:  aspirin  81 mg Oral Daily   Chlorhexidine Gluconate Cloth  6 each Topical Daily   digoxin  0.125 mg Oral Daily   famotidine  20 mg Oral BID   folic acid  1 mg Oral Daily   insulin aspart  0-15 Units Subcutaneous Q4H   losartan  12.5 mg Oral Daily   multivitamin with minerals  1 tablet Oral Daily   polyethylene glycol  17 g Oral Daily   rosuvastatin  20 mg Oral Daily   spironolactone  12.5 mg Oral Daily   thiamine  100 mg Oral Daily   Or   thiamine  100 mg Intravenous Daily    Infusions:  amiodarone 60 mg/hr (12/09/22 0800)   cefTRIAXone (ROCEPHIN)  IV Stopped (12/08/22 1424)   heparin 2,200 Units/hr (12/09/22 0800)    PRN Medications: acetaminophen, LORazepam **OR** LORazepam, mouth rinse, traZODone    Patient Profile   Jeremiah Dixon is a 62 year old male with a history of CAD, HTN,  and HLD.    Presented w/ CP and had PEA arrest in the ED. ROSC ~ 25 minutes. TNK given for presumed PE. AHF team consulted for cardiogenic shock.   Assessment/Plan   1. Acute systolic CHF/Cardiogenic shock: Shock now resolved.  S/p PEA arrest 11/7.  Received tenecteplase in ER given concern for massive PE in setting of PEA arrest but could this all be CHF with new AF/RVR?  HS-TnI rose to 2815 post- arrest.   I reviewed echo at bedside (somewhat difficult study) with LV EF difficult to quantify but looks in 35% range, RV moderately dilated/moderately dysfunctional with D-shaped septum, IVC dilated.  Extubated on 11/8 and now off all pressors with CI 2.37 by thermodilution and co-ox 61%. SBP 100s, creatinine 0.83, CVP 13-14.  He diuresed well yesterday, I/Os net negative 4010.  - Can stay off inotropes/pressors.  Can remove Swan.  Follow CVP and co-ox off CVL.  - Lasix 80 mg IV bid today and will give tolvaptan 15 mg x 1.  Replace K and repeat BMET in pm.  - Continue digoxin 0.125 - Continue  spironolactone 12.5 daily - Increase losartan to 25 mg daily.  BP marginal at times so will not transition to [place] yet.  - Repeat echo when HR under better control now that he  is extubated for better LV/RV assessment.  - Unna boots for LEE, ordered but not yet placed.  - Should have LHC/RHC => arrange for tomorrow.  2. CAD: Known occlusion of OM1 in 2007.  HS-TnI 397 => 2815 post arrest, no STEMI on ECG.  Has received tenecteplase.  - Continue ASA/statin.  - LHC/RHC tomorrow.  3. 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  Has had tenecteplase.  Lower extremity venous dopplers with no DVT.  Will be hard to definitively confirm PE diagnosis at this point, but will get long-term anticoagulation due to AF.  4. Atrial fibrillation: With RVR on admission, no prior history.  Converted to NSR on amiodarone initially but back in AF with RVR on 11/9 and in AF with RVR today.  - Continue amiodarone gtt 60 mg/hr.  - Cont heparin gtt - Continue digoxin as above.  - TEE-guided DCCV on Tuesday if does not convert on his own.  5. Acute hypoxemic respiratory failure: Extubated.  On 2L [place].  6. ETOH abuse: Per brother, "1/2 gallon beer" daily.  7. Hyponatremia: Hypervolemic hyponatremia. Na lower at 124 today.  - Fluid restrict.  - Tolvaptan 15 mg po x 1.  8. ID: Low grade fever to 100.8.  WBCs normal. - Continue empiric ceftriaxone for ?PNA.   Length of Stay: 3  Marca Ancona, MD  12/09/2022, 9:15 AM  Advanced Heart Failure Team Pager [phone] (M-F; 7a - 5p)  Please contact [facility] for night-coverage after hours (5p -7a ) and weekends on [email or web address]  CRITICAL CARE Performed by: Marca Ancona  Total critical care time: 35 minutes  Critical care time was exclusive of separately billable procedures and treating other patients.  Critical care was necessary to treat or prevent imminent or life-threatening deterioration.  Critical care was time spent personally by me on the  following activities: development of treatment plan with patient and/or surrogate as well as nursing, discussions with consultants, evaluation of patient's response to treatment, examination of patient, obtaining history from patient or surrogate, ordering and performing treatments and interventions, ordering and review of laboratory studies, ordering and review of radiographic studies, pulse oximetry and re-evaluation of patient's condition.  Marca Ancona 12/09/2022 9:15 AM

## 2022-12-09 NOTE — Progress Notes (Signed)
[facility] ADULT ICU REPLACEMENT PROTOCOL   The patient does apply for the [facility] Adult ICU Electrolyte Replacment Protocol based on the criteria listed below:   1.Exclusion criteria: TCTS, ECMO, Dialysis, and Myasthenia Gravis patients 2. Is GFR >/= 30 ml/min? Yes.    Patient's GFR today is > 60 3. Is SCr </= 2? Yes.   Patient's SCr is 0.83 mg/dL 4. Did SCr increase >/= 0.5 in 24 hours? No. 5.Pt's weight >40kg  Yes.   6. Abnormal electrolyte(s): K+ 3.4  7. Electrolytes replaced per protocol 8.  Call MD STAT for K+ </= 2.5, Phos </= 1, or Mag </= 1 Physician:  Dr Rhodia Albright, Jettie Booze 12/09/2022 6:13 AM

## 2022-12-09 NOTE — Progress Notes (Signed)
PHARMACY - ANTICOAGULATION CONSULT NOTE  Pharmacy Consult for heparin Indication: atrial fibrillation, pulmonary embolism  Allergies  Allergen Reactions   Bee Venom Palpitations   Diclofenac Diarrhea   Amoxicillin Diarrhea and Nausea Only   Lisinopril Cough   Pantoprazole Nausea Only    Other Reaction(s): chest pain    Patient Measurements: Height: 6\' 2"  (188 cm) Weight: (!) 139.3 kg (307 lb 1.6 oz) IBW/kg (Calculated) : 82.2 Heparin Dosing Weight: 110kg  Vital Signs: Temp: 99.5 F (37.5 C) (11/10 0500) BP: 107/73 (11/10 0000) Pulse Rate: 96 (11/10 0500)  Labs: Recent Labs    12/06/22 0941 12/06/22 1107 12/06/22 1214 12/08/22 0329 12/08/22 0553 12/08/22 1226 12/08/22 1724 12/08/22 1814 12/09/22 0459  HGB 10.3*  --    < > 8.9* 9.3*  --   --   --  8.7*  HCT 29.2*  --    < > 25.8* 27.4*  --   --   --  25.3*  PLT 198  --    < > 140* 155  --   --   --  148*  APTT 33  --   --   --   --   --   --   --   --   LABPROT 18.0*  --   --   --   --   --   --   --   --   INR 1.5*  --   --   --   --   --   --   --   --   HEPARINUNFRC  --   --    < > 0.28*  --  0.35  --  0.39 0.38  CREATININE 1.51*  --    < > 0.89  --   --  0.85  --  0.83  TROPONINIHS 2,059* 2,815*  --   --   --   --   --   --   --    < > = values in this interval not displayed.    Estimated Creatinine Clearance: 137 mL/min (by C-G formula based on SCr of 0.83 mg/dL).   Medical History: Past Medical History:  Diagnosis Date   MI (myocardial infarction) [facility])      Assessment: 62yo male with had PEA arrest on arrival to ED, tenecteplase was given for suspected PE. ROSC was obtained. CXR w/ pulmonary edema and EKG w/ Afib. No anticoagulation prior to admission. Pharmacy consulted for heparin for afib and possible PE.    Heparin level is therapeutic at 0.38, on heparin infusion at 2200 units/hr. CBC stable with Hgb 8.7, plt 148. No s/sx of bleeding or infusion issues.   Goal of Therapy:  Heparin level  0.3-0.7 units/ml Monitor platelets by anticoagulation protocol: Yes   Plan:  Continue heparin infusion at 2200 units/hr Monitor daily heparin levels, CBC, and signs/symptoms of bleeding  Thank you for allowing pharmacy to participate in this patient's care,  Wilmer Floor, PharmD PGY2 Cardiology Pharmacy Resident Phone: [phone] 12/09/2022 6:47 AM  Please check AMION for all [facility] Pharmacy phone numbers After 10:00 PM, call Main Pharmacy [phone]

## 2022-12-09 NOTE — Progress Notes (Signed)
NAME:  Jeremiah Dixon, MRN:  [id number], DOB:  [birth year], LOS: 3 ADMISSION DATE:  12/06/2022, CONSULTATION DATE:  12/06/2022 REFERRING MD:  Dr. Madilyn Hook, EDP, CHIEF COMPLAINT:  resp arrest    History of Present Illness:  62 year old male with past medical history of CAD, HTN, HLD, prediabetes who presented to the emergency department on 12/06/22 with respiratory arrest. Per EDP, initial complaint of shortness of breath. EMS reported being called out to home for chest pain and difficulty breathing. SpO2 in 60s for EMS. When he arrived to ED, unresponsive, pulseless and CPR was initiated. He was subsequently intubated. TNK was given during arrest out of concern for PE. EDP notes approximately 25 minutes of CPR total. Was started on sedation, epi gtt and PCCM consulted for admission.   Pertinent  Medical History  CAD, HTN, HLD  Significant Hospital Events: Including procedures, antibiotic start and stop dates in addition to other pertinent events   11/7: respiratory distress, hypoxemia subsequent pulselessness and ~48min CPR until ROSC. Admitted to ICU early AM. On AM rounds, awake and alert, however appears to be in cardiogenic shock. He is cold on multiple pressors. Echo with RV/LV dysfunction. Got aline, cvc. Swan placed and was fortunately assuring, he did not need cardiac mechanical support 11/8 decr vent support as able, weaning vasoactive meds   Interim History / Subjective:   Continue diuresis. Good UOP. Self convert from afib rvr.   Objective   Blood pressure 125/78, pulse (!) 128, temperature 99.7 F (37.6 C), temperature source Core, resp. rate (!) 21, height 6\' 2"  (1.88 m), weight (!) 139.3 kg, SpO2 97%. PAP: (27-50)/(22-42) 27/22 CVP:  [7 mmHg-22 mmHg] 11 mmHg PCWP:  [29 mmHg] 29 mmHg CO:  [8 L/min-8.8 L/min] 8 L/min CI:  [3.05 L/min/m2-3.35 L/min/m2] 3.05 L/min/m2      Intake/Output Summary (Last 24 hours) at 12/09/2022 0912 Last data filed at 12/09/2022 0800 Gross per 24  hour  Intake 1764.2 ml  Output 5550 ml  Net -3785.8 ml   Filed Weights   12/07/22 0500 12/08/22 0500 12/08/22 0615  Weight: (!) 141.7 kg (!) 147 kg (!) 139.3 kg    Examination: General: obese male, sitting up in bed HENT: NCAT tracking  Lungs: CTAB, no wheeze  Cardiovascular: RRR s1 s2  Abdomen: obese soft nt nd  Extremities: BL LE edema  Neuro: AAOx3  GU: foley   Resolved Hospital Problem list     Assessment & Plan:   Acute hypoxic respiratory failure  L pleural effusion -in setting of ?PE, cardiac arrest, volume overload P Continuing diuresis  Tolerating well.  Appreciate comanagement with HF service   PEA arrest   Possible massive PE s/p TNK Cardiogenic shock Afib, - RVR NSVT  CAD Hx HTN HLD -s/p ROSC with ~25 minutes of down time, rcvd tnk with c/f PE as catalyst, but was too unstable to image. Subsequently cold and on multiple pressors with BiV dysfxn and a D shaped septum  -DVT w/u was neg, PE dx is uncertain. Unlikely that we will be able to surely know. Could be acute HF r/t  fib  P: Continue lasix  HF has added spironolactone + digoxin + losartan On amio ggt, can likely switch to oral  Continue to work with PT OT  Will let HF service guide diuresis  Ok to remove swan   Lactic acidosis, improved  Hyponatremia -volume overload, HF P Continuing diuresis  Monitor   Prediabetes  - SSI - CBG q4   Anemia -  PRN CBC  EtOH use  -B vit and micronutrient support  - CIWA if needed, but likely out of window   Best Practice (right click and "Reselect all SmartList Selections" daily)   Diet/type: NPO DVT prophylaxis: systemic heparin GI prophylaxis: H2B Lines: Central line, Arterial Line, and yes and it is still needed Foley:  Yes, and it is still needed Code Status:  full code Last date of multidisciplinary goals of care discussion [attempts to call brother unanswered, will try again later]  Labs   CBC: Recent Labs  Lab 12/06/22 0941  12/06/22 1414 12/06/22 1608 12/07/22 0353 12/08/22 0329 12/08/22 0553 12/09/22 0459  WBC 14.6*  --   --  6.9 7.9 9.8 8.9  HGB 10.3*   < > 10.5* 9.5* 8.9* 9.3* 8.7*  HCT 29.2*   < > 31.0* 27.0* 25.8* 27.4* 25.3*  MCV 95.4  --   --  90.0 94.9 96.1 93.7  PLT 198  --   --  157 140* 155 148*   < > = values in this interval not displayed.    Basic Metabolic Panel: Recent Labs  Lab 12/06/22 0548 12/06/22 0827 12/06/22 0945 12/06/22 1214 12/06/22 1932 12/07/22 0353 12/07/22 1355 12/08/22 0329 12/08/22 0943 12/08/22 1724 12/09/22 0459  NA 130*   < >  --    < > 127* 127* 128* 130*  --  124* 124*  K 3.2*   < >  --    < > 3.6 3.7 4.7 3.5  --  3.5 3.4*  CL 91*   < >  --    < > 91* 93* 96* 94*  --  90* 88*  CO2 22   < >  --    < > 24 23 24 26   --  25 26  GLUCOSE 205*   < >  --    < > 231* 142* 118* 94  --  142* 121*  BUN 10   < >  --    < > 12 9 11 11   --  8 7*  CREATININE 1.38*   < >  --    < > 1.12 0.91 1.02 0.89  --  0.85 0.83  CALCIUM 7.6*   < >  --    < > 7.1* 7.2* 7.7* 8.1*  --  7.8* 7.9*  MG 1.7  --  1.7  --  1.7 2.0  --   --  1.6*  --   --   PHOS 6.0*  --   --   --   --   --   --   --   --   --   --    < > = values in this interval not displayed.   GFR: Estimated Creatinine Clearance: 137 mL/min (by C-G formula based on SCr of 0.83 mg/dL). Recent Labs  Lab 12/06/22 1622 12/06/22 1709 12/06/22 1932 12/07/22 0353 12/07/22 0831 12/08/22 0329 12/08/22 0553 12/09/22 0459  WBC  --   --   --  6.9  --  7.9 9.8 8.9  LATICACIDVEN 3.5* 3.7* 3.5*  --  1.1  --   --   --     Liver Function Tests: Recent Labs  Lab 12/06/22 0548 12/06/22 0941  AST 236* 201*  ALT 128* 115*  ALKPHOS 85 66  BILITOT 1.0 1.4*  PROT 5.1* 4.8*  ALBUMIN 2.6* 2.6*   No results for input(s): "LIPASE", "AMYLASE" in the last 168 hours. No results for input(s): "AMMONIA" in the  last 168 hours.  ABG    Component Value Date/Time   PHART 7.438 12/06/2022 1608   PCO2ART 29.3 (L) 12/06/2022 1608    PO2ART 98 12/06/2022 1608   HCO3 19.8 (L) 12/06/2022 1608   TCO2 21 (L) 12/06/2022 1608   ACIDBASEDEF 4.0 (H) 12/06/2022 1608   O2SAT 61.4 12/09/2022 0459     Coagulation Profile: Recent Labs  Lab 12/06/22 0302 12/06/22 0941  INR 1.2 1.5*    Cardiac Enzymes: No results for input(s): "CKTOTAL", "CKMB", "CKMBINDEX", "TROPONINI" in the last 168 hours.  HbA1C: Hgb A1c MFr Bld  Date/Time Value Ref Range Status  12/06/2022 05:51 AM 5.6 4.8 - 5.6 % Final    Comment:    (NOTE) Pre diabetes:          5.7%-6.4%  Diabetes:              >6.4%  Glycemic control for   <7.0% adults with diabetes   06/07/2009 09:50 PM 5.7 (H) <5.7 % Final    Comment:    See lab report for associated comment(s)    CBG: Recent Labs  Lab 12/08/22 1615 12/08/22 2002 12/08/22 2339 12/09/22 0506 12/09/22 0841  GLUCAP 112* 72 109* 127* 118*    Josephine Igo, DO [facility] 12/09/2022 9:12 AM

## 2022-12-10 ENCOUNTER — Other Ambulatory Visit: Payer: Self-pay

## 2022-12-10 ENCOUNTER — Encounter (HOSPITAL_COMMUNITY): Payer: Self-pay | Admitting: Pulmonary Disease

## 2022-12-10 ENCOUNTER — Inpatient Hospital Stay (HOSPITAL_COMMUNITY): Payer: Managed Care, Other (non HMO)

## 2022-12-10 ENCOUNTER — Encounter (HOSPITAL_COMMUNITY)
Admission: EM | Disposition: A | Discharge status: Rehabilitation Facility | Payer: Self-pay | Source: Home / Self Care | Attending: Pulmonary Disease

## 2022-12-10 DIAGNOSIS — E871 Hypo-osmolality and hyponatremia: Secondary | ICD-10-CM

## 2022-12-10 DIAGNOSIS — I469 Cardiac arrest, cause unspecified: Secondary | ICD-10-CM | POA: Diagnosis not present

## 2022-12-10 DIAGNOSIS — I214 Non-ST elevation (NSTEMI) myocardial infarction: Secondary | ICD-10-CM | POA: Diagnosis not present

## 2022-12-10 DIAGNOSIS — I5021 Acute systolic (congestive) heart failure: Secondary | ICD-10-CM

## 2022-12-10 DIAGNOSIS — I2510 Atherosclerotic heart disease of native coronary artery without angina pectoris: Secondary | ICD-10-CM | POA: Diagnosis not present

## 2022-12-10 HISTORY — PX: RIGHT/LEFT HEART CATH AND CORONARY ANGIOGRAPHY: CATH118266

## 2022-12-10 LAB — POCT I-STAT EG7
Acid-Base Excess: 5 mmol/L — ABNORMAL HIGH (ref 0.0–2.0)
Acid-Base Excess: 5 mmol/L — ABNORMAL HIGH (ref 0.0–2.0)
Bicarbonate: 29.8 mmol/L — ABNORMAL HIGH (ref 20.0–28.0)
Bicarbonate: 30 mmol/L — ABNORMAL HIGH (ref 20.0–28.0)
Calcium, Ion: 1.14 mmol/L — ABNORMAL LOW (ref 1.15–1.40)
Calcium, Ion: 1.14 mmol/L — ABNORMAL LOW (ref 1.15–1.40)
HCT: 26 % — ABNORMAL LOW (ref 39.0–52.0)
HCT: 27 % — ABNORMAL LOW (ref 39.0–52.0)
Hemoglobin: 8.8 g/dL — ABNORMAL LOW (ref 13.0–17.0)
Hemoglobin: 9.2 g/dL — ABNORMAL LOW (ref 13.0–17.0)
O2 Saturation: 54 %
O2 Saturation: 55 %
Potassium: 3.7 mmol/L (ref 3.5–5.1)
Potassium: 3.9 mmol/L (ref 3.5–5.1)
Sodium: 130 mmol/L — ABNORMAL LOW (ref 135–145)
Sodium: 132 mmol/L — ABNORMAL LOW (ref 135–145)
TCO2: 31 mmol/L (ref 22–32)
TCO2: 31 mmol/L (ref 22–32)
pCO2, Ven: 42.7 mm[Hg] — ABNORMAL LOW (ref 44–60)
pCO2, Ven: 43.8 mm[Hg] — ABNORMAL LOW (ref 44–60)
pH, Ven: 7.444 — ABNORMAL HIGH (ref 7.25–7.43)
pH, Ven: 7.452 — ABNORMAL HIGH (ref 7.25–7.43)
pO2, Ven: 28 mm[Hg] — CL (ref 32–45)
pO2, Ven: 28 mm[Hg] — CL (ref 32–45)

## 2022-12-10 LAB — BASIC METABOLIC PANEL
Anion gap: 9 (ref 5–15)
Anion gap: 7 (ref 5–15)
BUN: 7 mg/dL — ABNORMAL LOW (ref 8–23)
BUN: 7 mg/dL — ABNORMAL LOW (ref 8–23)
CO2: 29 mmol/L (ref 22–32)
CO2: 28 mmol/L (ref 22–32)
Calcium: 8.5 mg/dL — ABNORMAL LOW (ref 8.9–10.3)
Calcium: 8.7 mg/dL — ABNORMAL LOW (ref 8.9–10.3)
Chloride: 90 mmol/L — ABNORMAL LOW (ref 98–111)
Chloride: 93 mmol/L — ABNORMAL LOW (ref 98–111)
Creatinine, Ser: 0.97 mg/dL (ref 0.61–1.24)
Creatinine, Ser: 0.95 mg/dL (ref 0.61–1.24)
GFR, Estimated: >60 mL/min (ref >60)
GFR, Estimated: >60 mL/min (ref >60)
Glucose, Bld: 115 mg/dL — ABNORMAL HIGH (ref 70–99)
Glucose, Bld: 172 mg/dL — ABNORMAL HIGH (ref 70–99)
Potassium: 4.1 mmol/L (ref 3.5–5.1)
Potassium: 3.6 mmol/L (ref 3.5–5.1)
Sodium: 128 mmol/L — ABNORMAL LOW (ref 135–145)
Sodium: 128 mmol/L — ABNORMAL LOW (ref 135–145)

## 2022-12-10 LAB — POCT I-STAT 7, (LYTES, BLD GAS, ICA,H+H)
Acid-Base Excess: 0 mmol/L (ref 0.0–2.0)
Bicarbonate: 23.5 mmol/L (ref 20.0–28.0)
Calcium, Ion: 0.85 mmol/L — CL (ref 1.15–1.40)
HCT: 22 % — ABNORMAL LOW (ref 39.0–52.0)
Hemoglobin: 7.5 g/dL — ABNORMAL LOW (ref 13.0–17.0)
O2 Saturation: 89 %
Potassium: 3.1 mmol/L — ABNORMAL LOW (ref 3.5–5.1)
Sodium: 139 mmol/L (ref 135–145)
TCO2: 25 mmol/L (ref 22–32)
pCO2 arterial: 34.7 mm[Hg] (ref 32–48)
pH, Arterial: 7.439 (ref 7.35–7.45)
pO2, Arterial: 54 mm[Hg] — ABNORMAL LOW (ref 83–108)

## 2022-12-10 LAB — CBC
HCT: 26.3 % — ABNORMAL LOW (ref 39.0–52.0)
HCT: 26.6 % — ABNORMAL LOW (ref 39.0–52.0)
Hemoglobin: 9 g/dL — ABNORMAL LOW (ref 13.0–17.0)
Hemoglobin: 9.1 g/dL — ABNORMAL LOW (ref 13.0–17.0)
MCH: 32.1 pg (ref 26.0–34.0)
MCH: 32.5 pg (ref 26.0–34.0)
MCHC: 34.2 g/dL (ref 30.0–36.0)
MCHC: 34.2 g/dL (ref 30.0–36.0)
MCV: 93.9 fL (ref 80.0–100.0)
MCV: 95 fL (ref 80.0–100.0)
Platelets: 171 10*3/uL (ref 150–400)
Platelets: 179 10*3/uL (ref 150–400)
RBC: 2.8 MIL/uL — ABNORMAL LOW (ref 4.22–5.81)
RBC: 2.8 MIL/uL — ABNORMAL LOW (ref 4.22–5.81)
RDW: 12.7 % (ref 11.5–15.5)
RDW: 13 % (ref 11.5–15.5)
WBC: 7.3 10*3/uL (ref 4.0–10.5)
WBC: 6.9 10*3/uL (ref 4.0–10.5)
nRBC: 0 % (ref 0.0–0.2)
nRBC: 0 % (ref 0.0–0.2)

## 2022-12-10 LAB — COOXEMETRY PANEL
Carboxyhemoglobin: 2.2 % — ABNORMAL HIGH (ref 0.5–1.5)
Carboxyhemoglobin: 1.7 % — ABNORMAL HIGH (ref 0.5–1.5)
Methemoglobin: <0.7 % (ref 0.0–1.5)
Methemoglobin: <0.7 % (ref 0.0–1.5)
O2 Saturation: 61.7 %
O2 Saturation: 58.1 %
Total hemoglobin: 9.3 g/dL — ABNORMAL LOW (ref 12.0–16.0)
Total hemoglobin: 9.6 g/dL — ABNORMAL LOW (ref 12.0–16.0)

## 2022-12-10 LAB — GLUCOSE, CAPILLARY
Glucose-Capillary: 106 mg/dL — ABNORMAL HIGH (ref 70–99)
Glucose-Capillary: 106 mg/dL — ABNORMAL HIGH (ref 70–99)
Glucose-Capillary: 110 mg/dL — ABNORMAL HIGH (ref 70–99)
Glucose-Capillary: 110 mg/dL — ABNORMAL HIGH (ref 70–99)
Glucose-Capillary: 114 mg/dL — ABNORMAL HIGH (ref 70–99)
Glucose-Capillary: 133 mg/dL — ABNORMAL HIGH (ref 70–99)
Glucose-Capillary: 140 mg/dL — ABNORMAL HIGH (ref 70–99)

## 2022-12-10 LAB — MAGNESIUM
Magnesium: 1.8 mg/dL (ref 1.7–2.4)
Magnesium: 1.8 mg/dL (ref 1.7–2.4)

## 2022-12-10 LAB — CULTURE, BLOOD (ROUTINE X 2): Special Requests: ADEQUATE

## 2022-12-10 LAB — HEPARIN LEVEL (UNFRACTIONATED): Heparin Unfractionated: 0.38 [IU]/mL (ref 0.30–0.70)

## 2022-12-10 SURGERY — RIGHT/LEFT HEART CATH AND CORONARY ANGIOGRAPHY
Anesthesia: LOCAL

## 2022-12-10 MED ORDER — LABETALOL HCL 5 MG/ML IV SOLN: 10.0000 mg | INTRAVENOUS | Status: AC | PRN

## 2022-12-10 MED ORDER — SODIUM CHLORIDE 0.9 % IV SOLN: INTRAVENOUS | Status: DC

## 2022-12-10 MED ORDER — HYDRALAZINE HCL 20 MG/ML IJ SOLN
10.0000 mg | INTRAMUSCULAR | Status: AC | PRN
Start: 2022-12-10 — End: 2022-12-11

## 2022-12-10 MED ORDER — HEPARIN (PORCINE) IN NACL 1000-0.9 UT/500ML-% IV SOLN
INTRAVENOUS | Status: DC | PRN
  Administered 2022-12-10 (×2): 500 mL

## 2022-12-10 MED ORDER — HEPARIN (PORCINE) 25000 UT/250ML-% IV SOLN
2300.0000 [IU]/h | INTRAVENOUS | Status: DC
  Administered 2022-12-10 – 2022-12-11 (×2): 2200 [IU]/h via INTRAVENOUS
  Filled 2022-12-10: qty 250

## 2022-12-10 MED ORDER — FENTANYL CITRATE (PF) 100 MCG/2ML IJ SOLN
INTRAMUSCULAR | Status: AC
  Filled 2022-12-10: qty 2

## 2022-12-10 MED ORDER — SODIUM CHLORIDE 0.9% FLUSH
3.0000 mL | Freq: Two times a day (BID) | INTRAVENOUS | Status: DC
  Administered 2022-12-10 – 2022-12-11 (×2): 3 mL via INTRAVENOUS

## 2022-12-10 MED ORDER — IOHEXOL 350 MG/ML SOLN
INTRAVENOUS | Status: DC | PRN
  Administered 2022-12-10: 45 mL

## 2022-12-10 MED ORDER — MIDAZOLAM HCL 2 MG/2ML IJ SOLN
INTRAMUSCULAR | Status: DC | PRN
  Administered 2022-12-10: 2 mg via INTRAVENOUS

## 2022-12-10 MED ORDER — VERAPAMIL HCL 2.5 MG/ML IV SOLN
INTRAVENOUS | Status: DC | PRN
  Administered 2022-12-10: 10 mL via INTRA_ARTERIAL

## 2022-12-10 MED ORDER — SPIRONOLACTONE 25 MG PO TABS
25.0000 mg | ORAL_TABLET | Freq: Every day | ORAL | Status: DC
  Administered 2022-12-10 – 2022-12-19 (×10): 25 mg via ORAL
  Filled 2022-12-10 (×11): qty 1

## 2022-12-10 MED ORDER — ACETAMINOPHEN 325 MG PO TABS: 650.0000 mg | ORAL_TABLET | ORAL | Status: DC | PRN

## 2022-12-10 MED ORDER — SODIUM CHLORIDE 0.9% FLUSH
3.0000 mL | INTRAVENOUS | Status: DC | PRN
Start: 2022-12-10 — End: 2022-12-11

## 2022-12-10 MED ORDER — MAGNESIUM SULFATE 2 GM/50ML IV SOLN
2.0000 g | Freq: Once | INTRAVENOUS | Status: AC
Start: 2022-12-10 — End: 2022-12-10
  Administered 2022-12-10: 2 g via INTRAVENOUS
  Filled 2022-12-10: qty 50

## 2022-12-10 MED ORDER — SODIUM CHLORIDE 0.9 % IV SOLN
250.0000 mL | INTRAVENOUS | Status: DC | PRN
  Administered 2022-12-10: 250 mL via INTRAVENOUS

## 2022-12-10 MED ORDER — MAGNESIUM SULFATE 2 GM/50ML IV SOLN
2.0000 g | Freq: Once | INTRAVENOUS | Status: AC
  Administered 2022-12-10: 2 g via INTRAVENOUS
  Filled 2022-12-10: qty 50

## 2022-12-10 MED ORDER — LIDOCAINE HCL (PF) 1 % IJ SOLN
INTRAMUSCULAR | Status: AC
Start: 2022-12-10 — End: ?
  Filled 2022-12-10: qty 30

## 2022-12-10 MED ORDER — FENTANYL CITRATE (PF) 100 MCG/2ML IJ SOLN
INTRAMUSCULAR | Status: DC | PRN
  Administered 2022-12-10: 50 ug via INTRAVENOUS
  Administered 2022-12-10: 25 ug via INTRAVENOUS

## 2022-12-10 MED ORDER — MIDAZOLAM HCL 2 MG/2ML IJ SOLN
INTRAMUSCULAR | Status: AC
Start: 2022-12-10 — End: ?
  Filled 2022-12-10: qty 2

## 2022-12-10 MED ORDER — HEPARIN SODIUM (PORCINE) 1000 UNIT/ML IJ SOLN
INTRAMUSCULAR | Status: AC
  Filled 2022-12-10: qty 10

## 2022-12-10 MED ORDER — POTASSIUM CHLORIDE CRYS ER 20 MEQ PO TBCR
40.0000 meq | EXTENDED_RELEASE_TABLET | Freq: Once | ORAL | Status: AC
  Administered 2022-12-10: 40 meq via ORAL
  Filled 2022-12-10: qty 2

## 2022-12-10 MED ORDER — HEPARIN SODIUM (PORCINE) 1000 UNIT/ML IJ SOLN
INTRAMUSCULAR | Status: DC | PRN
  Administered 2022-12-10: 6000 [IU] via INTRAVENOUS

## 2022-12-10 MED ORDER — ONDANSETRON HCL 4 MG/2ML IJ SOLN: 4.0000 mg | Freq: Four times a day (QID) | INTRAMUSCULAR | Status: DC | PRN

## 2022-12-10 MED ORDER — LIDOCAINE HCL (PF) 1 % IJ SOLN
INTRAMUSCULAR | Status: DC | PRN
  Administered 2022-12-10 (×2): 2 mL via INTRADERMAL

## 2022-12-10 MED ORDER — VERAPAMIL HCL 2.5 MG/ML IV SOLN
INTRAVENOUS | Status: AC
  Filled 2022-12-10: qty 2

## 2022-12-10 SURGICAL SUPPLY — 12 items
CATH 5FR JL3.5 JR4 ANG PIG MP (CATHETERS) IMPLANT
CATH BALLN WEDGE 5F 110CM (CATHETERS) IMPLANT
DEVICE RAD COMP TR BAND LRG (VASCULAR PRODUCTS) IMPLANT
GLIDESHEATH SLEND SS 6F .021 (SHEATH) IMPLANT
GUIDEWIRE .025 260CM (WIRE) IMPLANT
GUIDEWIRE INQWIRE 1.5J.035X260 (WIRE) IMPLANT
INQWIRE 1.5J .035X260CM (WIRE) ×2
KIT SYRINGE INJ CVI SPIKEX1 (MISCELLANEOUS) IMPLANT
MAT PREVALON FULL STRYKER (MISCELLANEOUS) IMPLANT
PACK CARDIAC CATHETERIZATION (CUSTOM PROCEDURE TRAY) ×2 IMPLANT
SET ATX-X65L (MISCELLANEOUS) IMPLANT
SHEATH GLIDE SLENDER 4/5FR (SHEATH) IMPLANT

## 2022-12-10 NOTE — Progress Notes (Signed)
Clarified with pharmacy when to resume heparin gtt>>> to resume 2 hours s/p TR band removal. Primary RN Jeanie Cooks notified of this.

## 2022-12-10 NOTE — Progress Notes (Addendum)
Physical Therapy Treatment Patient Details Name: Jeremiah Dixon MRN: [id number] DOB: 01/29/1961 Today's Date: 12/10/2022   History of Present Illness Pt is a 62 y/o male presenting to the ED on 11/7 with respiratory arrest, hypoxia. In ED became unresponsive, pulseless and CPR 25 min. Intubated 11/7-11/8.  TNK given for PE concern. 11/11 heart cath planned. PMHx  CAD, HTN    PT Comments  Pt pleasant but frustrated by npo status awaiting heart cath. Pt with HR 110-140 Afib with activity, sPO2 98% on RA. BP sitting EOB 104/78 and in standing 84/73 (78) with pt asymptomatic. Pt able to initiate gait and increased transfers and mobility this session but limited by fatigue. Pt also lives alone with flight of stairs to apartment and he would benefit from intensive inpatient follow up therapy, >3 hours/day prior to return home. Pt to discuss with brother and family if they can assist at D/C with pt potentially able to stay at brother's house on the 1st floor.    If plan is discharge home, recommend the following: Assistance with cooking/housework;Assist for transportation;A little help with bathing/dressing/bathroom;A little help with walking and/or transfers;Direct supervision/assist for medications management;Help with stairs or ramp for entrance   Can travel by private vehicle        Equipment Recommendations  Rolling walker (2 wheels)    Recommendations for Other Services       Precautions / Restrictions Precautions Precautions: Fall     Mobility  Bed Mobility Overal bed mobility: Needs Assistance Bed Mobility: Supine to Sit     Supine to sit: HOB elevated, Contact guard Sit to supine: Min assist   General bed mobility comments: CGA with use of rail to pivot to EOB, increased time. MIn assist to lift legs to surface    Transfers Overall transfer level: Needs assistance   Transfers: Sit to/from Stand Sit to Stand: Min assist           General transfer comment: cues  for hand placement and safety with min assist to rise from surface    Ambulation/Gait Ambulation/Gait assistance: Min assist Gait Distance (Feet): 80 Feet Assistive device: Rolling walker (2 wheels) Gait Pattern/deviations: Step-through pattern, Decreased stride length, Trunk flexed   Gait velocity interpretation: <1.8 ft/sec, indicate of risk for recurrent falls   General Gait Details: mod cues to step into RW and extend trunk, chair follow for safety, limited by fatigue   Stairs             Wheelchair Mobility     Tilt Bed    Modified Rankin (Stroke Patients Only)       Balance Overall balance assessment: Needs assistance Sitting-balance support: No upper extremity supported, Feet supported Sitting balance-Leahy Scale: Fair     Standing balance support: Bilateral upper extremity supported, During functional activity, Reliant on assistive device for balance Standing balance-Leahy Scale: Poor Standing balance comment: RW in standing                            Cognition Arousal: Alert Behavior During Therapy: Flat affect Overall Cognitive Status: Impaired/Different from baseline Area of Impairment: Safety/judgement                     Memory: Decreased short-term memory Following Commands: Follows one step commands consistently Safety/Judgement: Decreased awareness of deficits   Problem Solving: Slow processing          Exercises  General Comments        Pertinent Vitals/Pain Pain Assessment Pain Assessment: No/denies pain Faces Pain Scale: Hurts a little bit Pain Location: chest with breathing Pain Descriptors / Indicators: Sore Pain Intervention(s): Limited activity within patient's tolerance, Repositioned    Home Living                          Prior Function            PT Goals (current goals can now be found in the care plan section) Progress towards PT goals: Progressing toward goals     Frequency    Min 1X/week      PT Plan      Co-evaluation              AM-PAC PT "6 Clicks" Mobility   Outcome Measure  Help needed turning from your back to your side while in a flat bed without using bedrails?: A Little Help needed moving from lying on your back to sitting on the side of a flat bed without using bedrails?: A Little Help needed moving to and from a bed to a chair (including a wheelchair)?: A Little Help needed standing up from a chair using your arms (e.g., wheelchair or bedside chair)?: A Little Help needed to walk in hospital room?: A Lot Help needed climbing 3-5 steps with a railing? : Total 6 Click Score: 15    End of Session   Activity Tolerance: Patient tolerated treatment well Patient left: in bed;with call bell/phone within reach;with nursing/sitter in room;with chair alarm set Nurse Communication: Mobility status PT Visit Diagnosis: Other abnormalities of gait and mobility (R26.89);Muscle weakness (generalized) (M62.81)     Time: 1137-1205 PT Time Calculation (min) (ACUTE ONLY): 28 min  Charges:    $Gait Training: 8-22 mins $Therapeutic Activity: 8-22 mins PT General Charges $$ ACUTE PT VISIT: 1 Visit                     [name], PT Acute Rehabilitation Services Office: [phone]    Enedina Finner Aleenah Homen 12/10/2022, 12:15 PM

## 2022-12-10 NOTE — Progress Notes (Signed)
Orthopedic Tech Progress Note Patient Details:  Jeremiah Dixon [birth year] [id number]  Ortho Devices Type of Ortho Device: Roland Rack boot Ortho Device/Splint Location: BLE Ortho Device/Splint Interventions: Ordered, Application   Post Interventions Patient Tolerated: Well  Jeremiah Dixon 12/10/2022, 3:22 PM

## 2022-12-10 NOTE — H&P (View-Only) (Signed)
Patient ID: Jeremiah Dixon, male   DOB: August 11, 1960, 62 y.o.   MRN: [id number]     Advanced Heart Failure Rounding Note  PCP-Cardiologist: None   Subjective:    Extubated 11/8, now on 2L .   Off all inotropic and pressor support. Co-ox 62%.   Brisk diuresis yesterday w/ IV Lasix + tolvaptan, 10L in UOP!! Net negative 7L for the day. Wt down 19 lb. CVP ~9 overnight. C/w good UOP this morning. Urine clear.   SCr stable 0.97. K 4.1, Mg 1.8. Na improved 123>>128.   On empiric ceftriaxone for ?PNA. mTemp overnight 100.6. WBC nl at 7.3.   He remains in AF with RVR 120s.  He is now on amiodarone gtt 60 mg/hr and heparin gtt.   Denies dyspnea. O2 sats stable on RA. No CP. Main complaint is hunger. NPO for cath.     Objective:   Weight Range: 130.1 kg Body mass index is 36.83 kg/m.   Vital Signs:   Temp:  [97.1 F (36.2 C)-100.6 F (38.1 C)] 98.2 F (36.8 C) (11/11 0800) Pulse Rate:  [99-134] 114 (11/11 0600) Resp:  [14-33] 22 (11/11 0600) BP: (82-130)/(54-94) 127/78 (11/11 0600) SpO2:  [92 %-100 %] 96 % (11/11 0600) Weight:  [130.1 kg] 130.1 kg (11/11 0709) Last BM Date : 12/06/22  Weight change: Filed Weights   12/08/22 0500 12/08/22 0615 12/10/22 0709  Weight: (!) 147 kg (!) 139.3 kg 130.1 kg    Intake/Output:   Intake/Output Summary (Last 24 hours) at 12/10/2022 0925 Last data filed at 12/10/2022 0700 Gross per 24 hour  Intake 2688.23 ml  Output 9865 ml  Net -7176.77 ml      Physical Exam    CVP 9  General:  Well appearing, obese. No respiratory difficulty HEENT: normal Neck: supple. Thick neck, JVD not well visualized. Carotids 2+ bilat; no bruits. No lymphadenopathy or thyromegaly appreciated. Cor: PMI nondisplaced. Irregular irregular rhythm and tachy rate. No rubs, gallops or murmurs. Lungs: decreased BS at the bases bilaterally  Abdomen: soft, nontender, nondistended. No hepatosplenomegaly. No bruits or masses. Good bowel sounds. Extremities:  no cyanosis, clubbing, rash, 1+ b/l ankle/ pretibial edema  Neuro: alert & oriented x 3, cranial nerves grossly intact. moves all 4 extremities w/o difficulty. Affect pleasant.    Telemetry   Atrial fibrillation 120s.    EKG    No new EKG to review   Labs    CBC Recent Labs    12/09/22 0459 12/10/22 0416  WBC 8.9 7.3  HGB 8.7* 9.0*  HCT 25.3* 26.3*  MCV 93.7 93.9  PLT 148* 171   Basic Metabolic Panel Recent Labs    47/82/95 1007 12/09/22 1718 12/10/22 0416  NA 123* 126* 128*  K 3.7 4.3 4.1  CL 87* 89* 90*  CO2 27 30 29   GLUCOSE 124* 139* 115*  BUN 7* 9 7*  CREATININE 0.72 0.86 0.97  CALCIUM 7.9* 8.0* 8.5*  MG 1.6*  --  1.8   Liver Function Tests Recent Labs    12/09/22 1007  AST 50*  ALT 47*  ALKPHOS 56  BILITOT 1.2*  PROT 5.5*  ALBUMIN 2.5*   No results for input(s): "LIPASE", "AMYLASE" in the last 72 hours. Cardiac Enzymes No results for input(s): "CKTOTAL", "CKMB", "CKMBINDEX", "TROPONINI" in the last 72 hours.  BNP: BNP (last 3 results) Recent Labs    12/06/22 0551  BNP 402.8*    ProBNP (last 3 results) No results for input(s): "PROBNP" in the last  8760 hours.   D-Dimer No results for input(s): "DDIMER" in the last 72 hours. Hemoglobin A1C No results for input(s): "HGBA1C" in the last 72 hours.  Fasting Lipid Panel No results for input(s): "CHOL", "HDL", "LDLCALC", "TRIG", "CHOLHDL", "LDLDIRECT" in the last 72 hours.  Thyroid Function Tests No results for input(s): "TSH", "T4TOTAL", "T3FREE", "THYROIDAB" in the last 72 hours.  Invalid input(s): "FREET3"   Other results:   Imaging    No results found.   Medications:     Scheduled Medications:  aspirin  81 mg Oral Daily   Chlorhexidine Gluconate Cloth  6 each Topical Daily   digoxin  0.125 mg Oral Daily   famotidine  20 mg Oral BID   folic acid  1 mg Oral Daily   furosemide  80 mg Intravenous Q12H   insulin aspart  0-15 Units Subcutaneous Q4H   lidocaine  1 patch  Transdermal Q24H   losartan  25 mg Oral Daily   multivitamin with minerals  1 tablet Oral Daily   polyethylene glycol  17 g Oral Daily   rosuvastatin  20 mg Oral Daily   spironolactone  12.5 mg Oral Daily   thiamine  100 mg Oral Daily   Or   thiamine  100 mg Intravenous Daily    Infusions:  sodium chloride 1 mL/kg/hr (12/10/22 0700)   amiodarone 60 mg/hr (12/10/22 0700)   cefTRIAXone (ROCEPHIN)  IV Stopped (12/09/22 1505)   heparin 2,200 Units/hr (12/10/22 0700)   magnesium sulfate bolus IVPB      PRN Medications: acetaminophen, guaiFENesin, LORazepam **OR** LORazepam, mouth rinse, oxyCODONE, traZODone    Patient Profile   Jeremiah Dixon is a 62 year old male with a history of CAD, HTN,  and HLD.    Presented w/ CP and had PEA arrest in the ED. ROSC ~ 25 minutes. TNK given for presumed PE. AHF team consulted for cardiogenic shock.   Assessment/Plan   1. Acute systolic CHF/Cardiogenic shock: Shock now resolved.  S/p PEA arrest 11/7.  Received tenecteplase in ER given concern for massive PE in setting of PEA arrest but could this all be CHF with new AF/RVR?  HS-TnI rose to 2815 post- arrest.  Bedside echo (somewhat difficult study) with LV EF difficult to quantify but looks in 35% range, RV moderately dilated/moderately dysfunctional with D-shaped septum, IVC dilated.  Extubated on 11/8 and now off all pressors, Co-ox 62%. Diuresing well w/ IV Lasix. Scr stable. C/w mild volume overload.  - give another dose of IV Lasix this morning 80 mg x 1 - plan R/LHC today   - Continue digoxin 0.125 - Increase Spironolactone to 25 mg daily  - Continue losartan 25 mg daily.  If BP/SCr remains stable post cath, will transition to [facility] tomorrow  - Repeat echo when HR under better control now that he is extubated for better LV/RV assessment.   2. CAD: Known occlusion of OM1 in 2007.  HS-TnI 397 => 2815 post arrest, no STEMI on ECG.  Has received tenecteplase.  - Continue ASA/statin.  -  LHC/RHC today  3. 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  Has had tenecteplase.  Lower extremity venous dopplers with no DVT.  Will be hard to definitively confirm PE diagnosis at this point, but will get long-term anticoagulation due to AF.   4. Atrial fibrillation: With RVR on admission, no prior history.  Converted to NSR on amiodarone initially but back in AF with RVR on 11/9 and in  AF with RVR today.  - Continue amiodarone gtt 60 mg/hr.  - Cont heparin gtt - Continue digoxin as above.  - TEE-guided DCCV on Tuesday if does not convert on his own.   5. Acute hypoxemic respiratory failure: Extubated.  Stable on RA   6. ETOH abuse: Per brother, "1/2 gallon beer" daily.   7. Hyponatremia: Hypervolemic hyponatremia. Na improved w/ tolvaptan 123>>128 today  - continue IV Lasix  - Fluid restrict.   8. ID: Low grade fever to 100.6.  WBCs normal. - Continue empiric ceftriaxone for ?PNA.   Length of Stay: [address] PA-C  12/10/2022, 9:25 AM  Advanced Heart Failure Team Pager [phone] (M-F; 7a - 5p)  Please contact [facility] for night-coverage after hours (5p -7a ) and weekends on [email or web address]  Patient seen and examined with the above-signed Advanced Practice Provider and/or Housestaff. I personally reviewed laboratory data, imaging studies and relevant notes. I independently examined the patient and formulated the important aspects of the plan. I have edited the note to reflect any of my changes or salient points. I have personally discussed the plan with the patient and/or family.  Massive diuresis overnight. Off pressors now. Breathing better. Co-ox 62%  Remains in AF with RVR despite IV amio   No CP.  Hg stable. Serum sodium improving  General:  Sitting up in bed . No resp difficulty HEENT: normal Neck: supple. no JVD. Carotids 2+ bilat; no bruits. No lymphadenopathy or thryomegaly appreciated. Cor: Irregular. tachy. No rubs, gallops or  murmurs. Lungs: clear Abdomen: soft, nontender, nondistended. No hepatosplenomegaly. No bruits or masses. Good bowel sounds. Extremities: no cyanosis, clubbing, rash, 1+ edema Neuro: alert & orientedx3, cranial nerves grossly intact. moves all 4 extremities w/o difficulty. Affect pleasant  Excellent diuresis. Still mildly overloaded. Remains in AF with RVR.   Plan R/L cath today followed by TEE/DC-CV tomorrow. Continue IV amio and heparin.   Arvilla Meres, MD  10:58 AM

## 2022-12-10 NOTE — Progress Notes (Addendum)
Patient ID: Tahmid Dilone, male   DOB: August 11, 1960, 62 y.o.   MRN: [id number]     Advanced Heart Failure Rounding Note  PCP-Cardiologist: None   Subjective:    Extubated 11/8, now on 2L .   Off all inotropic and pressor support. Co-ox 62%.   Brisk diuresis yesterday w/ IV Lasix + tolvaptan, 10L in UOP!! Net negative 7L for the day. Wt down 19 lb. CVP ~9 overnight. C/w good UOP this morning. Urine clear.   SCr stable 0.97. K 4.1, Mg 1.8. Na improved 123>>128.   On empiric ceftriaxone for ?PNA. mTemp overnight 100.6. WBC nl at 7.3.   He remains in AF with RVR 120s.  He is now on amiodarone gtt 60 mg/hr and heparin gtt.   Denies dyspnea. O2 sats stable on RA. No CP. Main complaint is hunger. NPO for cath.     Objective:   Weight Range: 130.1 kg Body mass index is 36.83 kg/m.   Vital Signs:   Temp:  [97.1 F (36.2 C)-100.6 F (38.1 C)] 98.2 F (36.8 C) (11/11 0800) Pulse Rate:  [99-134] 114 (11/11 0600) Resp:  [14-33] 22 (11/11 0600) BP: (82-130)/(54-94) 127/78 (11/11 0600) SpO2:  [92 %-100 %] 96 % (11/11 0600) Weight:  [130.1 kg] 130.1 kg (11/11 0709) Last BM Date : 12/06/22  Weight change: Filed Weights   12/08/22 0500 12/08/22 0615 12/10/22 0709  Weight: (!) 147 kg (!) 139.3 kg 130.1 kg    Intake/Output:   Intake/Output Summary (Last 24 hours) at 12/10/2022 0925 Last data filed at 12/10/2022 0700 Gross per 24 hour  Intake 2688.23 ml  Output 9865 ml  Net -7176.77 ml      Physical Exam    CVP 9  General:  Well appearing, obese. No respiratory difficulty HEENT: normal Neck: supple. Thick neck, JVD not well visualized. Carotids 2+ bilat; no bruits. No lymphadenopathy or thyromegaly appreciated. Cor: PMI nondisplaced. Irregular irregular rhythm and tachy rate. No rubs, gallops or murmurs. Lungs: decreased BS at the bases bilaterally  Abdomen: soft, nontender, nondistended. No hepatosplenomegaly. No bruits or masses. Good bowel sounds. Extremities:  no cyanosis, clubbing, rash, 1+ b/l ankle/ pretibial edema  Neuro: alert & oriented x 3, cranial nerves grossly intact. moves all 4 extremities w/o difficulty. Affect pleasant.    Telemetry   Atrial fibrillation 120s.    EKG    No new EKG to review   Labs    CBC Recent Labs    12/09/22 0459 12/10/22 0416  WBC 8.9 7.3  HGB 8.7* 9.0*  HCT 25.3* 26.3*  MCV 93.7 93.9  PLT 148* 171   Basic Metabolic Panel Recent Labs    47/82/95 1007 12/09/22 1718 12/10/22 0416  NA 123* 126* 128*  K 3.7 4.3 4.1  CL 87* 89* 90*  CO2 27 30 29   GLUCOSE 124* 139* 115*  BUN 7* 9 7*  CREATININE 0.72 0.86 0.97  CALCIUM 7.9* 8.0* 8.5*  MG 1.6*  --  1.8   Liver Function Tests Recent Labs    12/09/22 1007  AST 50*  ALT 47*  ALKPHOS 56  BILITOT 1.2*  PROT 5.5*  ALBUMIN 2.5*   No results for input(s): "LIPASE", "AMYLASE" in the last 72 hours. Cardiac Enzymes No results for input(s): "CKTOTAL", "CKMB", "CKMBINDEX", "TROPONINI" in the last 72 hours.  BNP: BNP (last 3 results) Recent Labs    12/06/22 0551  BNP 402.8*    ProBNP (last 3 results) No results for input(s): "PROBNP" in the last  8760 hours.   D-Dimer No results for input(s): "DDIMER" in the last 72 hours. Hemoglobin A1C No results for input(s): "HGBA1C" in the last 72 hours.  Fasting Lipid Panel No results for input(s): "CHOL", "HDL", "LDLCALC", "TRIG", "CHOLHDL", "LDLDIRECT" in the last 72 hours.  Thyroid Function Tests No results for input(s): "TSH", "T4TOTAL", "T3FREE", "THYROIDAB" in the last 72 hours.  Invalid input(s): "FREET3"   Other results:   Imaging    No results found.   Medications:     Scheduled Medications:  aspirin  81 mg Oral Daily   Chlorhexidine Gluconate Cloth  6 each Topical Daily   digoxin  0.125 mg Oral Daily   famotidine  20 mg Oral BID   folic acid  1 mg Oral Daily   furosemide  80 mg Intravenous Q12H   insulin aspart  0-15 Units Subcutaneous Q4H   lidocaine  1 patch  Transdermal Q24H   losartan  25 mg Oral Daily   multivitamin with minerals  1 tablet Oral Daily   polyethylene glycol  17 g Oral Daily   rosuvastatin  20 mg Oral Daily   spironolactone  12.5 mg Oral Daily   thiamine  100 mg Oral Daily   Or   thiamine  100 mg Intravenous Daily    Infusions:  sodium chloride 1 mL/kg/hr (12/10/22 0700)   amiodarone 60 mg/hr (12/10/22 0700)   cefTRIAXone (ROCEPHIN)  IV Stopped (12/09/22 1505)   heparin 2,200 Units/hr (12/10/22 0700)   magnesium sulfate bolus IVPB      PRN Medications: acetaminophen, guaiFENesin, LORazepam **OR** LORazepam, mouth rinse, oxyCODONE, traZODone    Patient Profile   Mr Isaksen is a 62 year old male with a history of CAD, HTN,  and HLD.    Presented w/ CP and had PEA arrest in the ED. ROSC ~ 25 minutes. TNK given for presumed PE. AHF team consulted for cardiogenic shock.   Assessment/Plan   1. Acute systolic CHF/Cardiogenic shock: Shock now resolved.  S/p PEA arrest 11/7.  Received tenecteplase in ER given concern for massive PE in setting of PEA arrest but could this all be CHF with new AF/RVR?  HS-TnI rose to 2815 post- arrest.  Bedside echo (somewhat difficult study) with LV EF difficult to quantify but looks in 35% range, RV moderately dilated/moderately dysfunctional with D-shaped septum, IVC dilated.  Extubated on 11/8 and now off all pressors, Co-ox 62%. Diuresing well w/ IV Lasix. Scr stable. C/w mild volume overload.  - give another dose of IV Lasix this morning 80 mg x 1 - plan R/LHC today   - Continue digoxin 0.125 - Increase Spironolactone to 25 mg daily  - Continue losartan 25 mg daily.  If BP/SCr remains stable post cath, will transition to [facility] tomorrow  - Repeat echo when HR under better control now that he is extubated for better LV/RV assessment.   2. CAD: Known occlusion of OM1 in 2007.  HS-TnI 397 => 2815 post arrest, no STEMI on ECG.  Has received tenecteplase.  - Continue ASA/statin.  -  LHC/RHC today  3. 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  Has had tenecteplase.  Lower extremity venous dopplers with no DVT.  Will be hard to definitively confirm PE diagnosis at this point, but will get long-term anticoagulation due to AF.   4. Atrial fibrillation: With RVR on admission, no prior history.  Converted to NSR on amiodarone initially but back in AF with RVR on 11/9 and in  AF with RVR today.  - Continue amiodarone gtt 60 mg/hr.  - Cont heparin gtt - Continue digoxin as above.  - TEE-guided DCCV on Tuesday if does not convert on his own.   5. Acute hypoxemic respiratory failure: Extubated.  Stable on RA   6. ETOH abuse: Per brother, "1/2 gallon beer" daily.   7. Hyponatremia: Hypervolemic hyponatremia. Na improved w/ tolvaptan 123>>128 today  - continue IV Lasix  - Fluid restrict.   8. ID: Low grade fever to 100.6.  WBCs normal. - Continue empiric ceftriaxone for ?PNA.   Length of Stay: [address] PA-C  12/10/2022, 9:25 AM  Advanced Heart Failure Team Pager [phone] (M-F; 7a - 5p)  Please contact [facility] for night-coverage after hours (5p -7a ) and weekends on [email or web address]  Patient seen and examined with the above-signed Advanced Practice Provider and/or Housestaff. I personally reviewed laboratory data, imaging studies and relevant notes. I independently examined the patient and formulated the important aspects of the plan. I have edited the note to reflect any of my changes or salient points. I have personally discussed the plan with the patient and/or family.  Massive diuresis overnight. Off pressors now. Breathing better. Co-ox 62%  Remains in AF with RVR despite IV amio   No CP.  Hg stable. Serum sodium improving  General:  Sitting up in bed . No resp difficulty HEENT: normal Neck: supple. no JVD. Carotids 2+ bilat; no bruits. No lymphadenopathy or thryomegaly appreciated. Cor: Irregular. tachy. No rubs, gallops or  murmurs. Lungs: clear Abdomen: soft, nontender, nondistended. No hepatosplenomegaly. No bruits or masses. Good bowel sounds. Extremities: no cyanosis, clubbing, rash, 1+ edema Neuro: alert & orientedx3, cranial nerves grossly intact. moves all 4 extremities w/o difficulty. Affect pleasant  Excellent diuresis. Still mildly overloaded. Remains in AF with RVR.   Plan R/L cath today followed by TEE/DC-CV tomorrow. Continue IV amio and heparin.   Arvilla Meres, MD  10:58 AM

## 2022-12-10 NOTE — Progress Notes (Signed)
Back from left and right heart cath  Findings: Prox ->mid RCA 100% stenosed, Prox RCA 60%, Ost LAD 70% Mid LM to distal LM 70% Ost Cx 100% stenosed Mid LAD 95% PRox LAD to mid LAD 50% 2nd diag 40%  RA 8, PA 45/10/ PCWP 12, FICK CO and CI: 8.1 and 3.2  Now back in unit   On exam BP 134/67   Pulse (!) 107   Temp 98.2 F (36.8 C) (Oral)   Resp 16   Ht 6\' 2"  (1.88 m)   Wt 130.1 kg   SpO2 (!) 88%   BMI 36.83 kg/m   Sitting up in bed. No distress on room air.  Hemodynamics stable   Plan Cont care as outlined prior (I will look at his chest w/ POCUS to eval for left effusion tomorrow) Will need CVTS consult for CABG (timing still TBD) given concern might have also been PE F/u echo

## 2022-12-10 NOTE — Interval H&P Note (Signed)
History and Physical Interval Note:  12/10/2022 4:54 PM  Jeremiah Dixon  has presented today for surgery, with the diagnosis of HF.  The various methods of treatment have been discussed with the patient and family. After consideration of risks, benefits and other options for treatment, the patient has consented to  Procedure(s): RIGHT/LEFT HEART CATH AND CORONARY ANGIOGRAPHY (N/A) as a surgical intervention.  The patient's history has been reviewed, patient examined, no change in status, stable for surgery.  I have reviewed the patient's chart and labs.  Questions were answered to the patient's satisfaction.     Jeremiah Dixon

## 2022-12-10 NOTE — Progress Notes (Signed)
PHARMACY - ANTICOAGULATION CONSULT NOTE  Pharmacy Consult for heparin Indication: atrial fibrillation, pulmonary embolism  Allergies  Allergen Reactions   Bee Venom Palpitations   Diclofenac Diarrhea   Amoxicillin Diarrhea and Nausea Only   Lisinopril Cough   Pantoprazole Nausea Only    Other Reaction(s): chest pain    Patient Measurements: Height: 6\' 2"  (188 cm) Weight: 130.1 kg (286 lb 13.1 oz) IBW/kg (Calculated) : 82.2 Heparin Dosing Weight: 110kg  Vital Signs: Temp: 98.2 F (36.8 C) (11/11 1130) Temp Source: Oral (11/11 1130) BP: 134/67 (11/11 1735) Pulse Rate: 107 (11/11 1735)  Labs: Recent Labs    12/08/22 0553 12/08/22 1226 12/08/22 1814 12/09/22 0459 12/09/22 1007 12/09/22 1718 12/10/22 0416  HGB 9.3*  --   --  8.7*  --   --  9.0*  HCT 27.4*  --   --  25.3*  --   --  26.3*  PLT 155  --   --  148*  --   --  171  HEPARINUNFRC  --    < > 0.39 0.38  --   --  0.38  CREATININE  --    < >  --  0.83 0.72 0.86 0.97   < > = values in this interval not displayed.    Estimated Creatinine Clearance: 113.2 mL/min (by C-G formula based on SCr of 0.97 mg/dL).   Medical History: Past Medical History:  Diagnosis Date   HLD (hyperlipidemia)    HTN (hypertension)    MI (myocardial infarction) (HCC)      Assessment: 62yo male with had PEA arrest on arrival to ED, tenecteplase was given for suspected PE. ROSC was obtained. CXR w/ pulmonary edema and EKG w/ Afib. No anticoagulation prior to admission. Pharmacy consulted for heparin for afib and possible PE.    Heparin level was previously therapeutic at 0.38, on heparin infusion at 2200 units/hr. CBC stable with Hgb 9.0, plt 171. Underwent cardiac cath finding severe 3v CAD w/ CTO of ostLCx and midRCA - plan to restart heparin 2 hours after TR band removed (documented on 11/11~1945).  Goal of Therapy:  Heparin level 0.3-0.7 units/ml Monitor platelets by anticoagulation protocol: Yes   Plan:  Restart heparin  infusion at 2200 units/hr on 11/11@2200  Monitor daily heparin levels, CBC, and signs/symptoms of bleeding  Thank you for allowing pharmacy to participate in this patient's care,  Sherron Monday, PharmD, BCCCP Clinical Pharmacist  Phone: [phone] 12/10/2022 6:13 PM  Please check AMION for all [facility] Pharmacy phone numbers After 10:00 PM, call Main Pharmacy [phone]

## 2022-12-10 NOTE — Progress Notes (Signed)
Inpatient Rehab Admissions Coordinator:  ? ?Per therapy recommendations,  patient was screened for CIR candidacy by Devaney Segers, MS, CCC-SLP. At this time, Pt. Appears to be a a potential candidate for CIR. I will place   order for rehab consult per protocol for full assessment. Please contact me any with questions. ? ?Trine Fread, MS, CCC-SLP ?Rehab Admissions Coordinator  ?[phone] (celll) ?[phone] (office) ? ?

## 2022-12-10 NOTE — Progress Notes (Signed)
[facility] ADULT ICU REPLACEMENT PROTOCOL   The patient does apply for the [facility] Adult ICU Electrolyte Replacment Protocol based on the criteria listed below:   1.Exclusion criteria: TCTS, ECMO, Dialysis, and Myasthenia Gravis patients 2. Is GFR >/= 30 ml/min? Yes.    Patient's GFR today is >60 3. Is SCr </= 2? Yes.   Patient's SCr is 0.95 mg/dL 4. Did SCr increase >/= 0.5 in 24 hours? No. 5.Pt's weight >40kg  Yes.   6. Abnormal electrolyte(s): K 3.6, Mag 1.8  7. Electrolytes replaced per protocol 8.  Call MD STAT for K+ </= 2.5, Phos </= 1, or Mag </= 1 Physician:    Markus Daft A 12/10/2022 8:16 PM

## 2022-12-10 NOTE — Progress Notes (Signed)
PHARMACY - ANTICOAGULATION CONSULT NOTE  Pharmacy Consult for heparin Indication: atrial fibrillation, pulmonary embolism  Allergies  Allergen Reactions   Bee Venom Palpitations   Diclofenac Diarrhea   Amoxicillin Diarrhea and Nausea Only   Lisinopril Cough   Pantoprazole Nausea Only    Other Reaction(s): chest pain    Patient Measurements: Height: 6\' 2"  (188 cm) Weight: 130.1 kg (286 lb 13.1 oz) IBW/kg (Calculated) : 82.2 Heparin Dosing Weight: 110kg  Vital Signs: Temp: 98.2 F (36.8 C) (11/11 0800) Temp Source: Oral (11/11 0800) BP: 131/78 (11/11 1000) Pulse Rate: 111 (11/11 1000)  Labs: Recent Labs    12/08/22 0553 12/08/22 1226 12/08/22 1814 12/09/22 0459 12/09/22 1007 12/09/22 1718 12/10/22 0416  HGB 9.3*  --   --  8.7*  --   --  9.0*  HCT 27.4*  --   --  25.3*  --   --  26.3*  PLT 155  --   --  148*  --   --  171  HEPARINUNFRC  --    < > 0.39 0.38  --   --  0.38  CREATININE  --    < >  --  0.83 0.72 0.86 0.97   < > = values in this interval not displayed.    Estimated Creatinine Clearance: 113.2 mL/min (by C-G formula based on SCr of 0.97 mg/dL).   Medical History: Past Medical History:  Diagnosis Date   MI (myocardial infarction) [facility])      Assessment: 62yo male with had PEA arrest on arrival to ED, tenecteplase was given for suspected PE. ROSC was obtained. CXR w/ pulmonary edema and EKG w/ Afib. No anticoagulation prior to admission. Pharmacy consulted for heparin for afib and possible PE.    Heparin level is therapeutic at 0.38, on heparin infusion at 2200 units/hr. CBC stable with Hgb 9.0, plt 171. No s/sx of bleeding or infusion issues per RN.   Goal of Therapy:  Heparin level 0.3-0.7 units/ml Monitor platelets by anticoagulation protocol: Yes   Plan:  Continue heparin infusion at 2200 units/hr Monitor daily heparin levels, CBC, and signs/symptoms of bleeding  Thank you for allowing pharmacy to participate in this patient's  care,    Ernestene Kiel, PharmD PGY1 Pharmacy Resident  Please check AMION for all [facility] Pharmacy phone numbers After 10:00 PM, call Main Pharmacy [phone] 12/10/2022 11:35 AM

## 2022-12-10 NOTE — Progress Notes (Signed)
NAME:  Jeremiah Dixon, MRN:  [id number], DOB:  19-Mar-1960, LOS: 4 ADMISSION DATE:  12/06/2022, CONSULTATION DATE:  12/06/2022 REFERRING MD:  Dr. Madilyn Hook, EDP, CHIEF COMPLAINT:  resp arrest    History of Present Illness:  62 year old male with past medical history of CAD, HTN, HLD, prediabetes who presented to the emergency department on 12/06/22 with respiratory arrest. Per EDP, initial complaint of shortness of breath. EMS reported being called out to home for chest pain and difficulty breathing. SpO2 in 60s for EMS. When he arrived to ED, unresponsive, pulseless and CPR was initiated. He was subsequently intubated. TNK was given during arrest out of concern for PE. EDP notes approximately 25 minutes of CPR total. Was started on sedation, epi gtt and PCCM consulted for admission.   Pertinent  Medical History  CAD, HTN, HLD  Significant Hospital Events: Including procedures, antibiotic start and stop dates in addition to other pertinent events   11/7: respiratory distress, hypoxemia subsequent pulselessness and ~30min CPR until ROSC. Admitted to ICU early AM. On AM rounds, awake and alert, however appears to be in cardiogenic shock. He is cold on multiple pressors. Echo with RV/LV dysfunction. Got aline, cvc. Swan placed and was fortunately assuring, he did not need cardiac mechanical support 11/8 decr vent support as able, weaning vasoactive meds  11/9 extubated. Getting diuresis added metolazone to lasix  11/10 off all inotropic and pressor support. Back into AF w/ RVR so amio increased. Started Dig 11/11 still in AF w/ RVR., going for cardiac cath  Interim History / Subjective:   Wants to eat Still SOB w/ exertion Still has cough  Objective   Blood pressure 127/78, pulse (!) 114, temperature 98.2 F (36.8 C), temperature source Oral, resp. rate (!) 22, height 6\' 2"  (1.88 m), weight 130.1 kg, SpO2 96%. CVP:  [6 mmHg-13 mmHg] 6 mmHg      Intake/Output Summary (Last 24 hours) at  12/10/2022 1035 Last data filed at 12/10/2022 0900 Gross per 24 hour  Intake 2688.23 ml  Output 01027 ml  Net -7601.77 ml   Filed Weights   12/08/22 0500 12/08/22 0615 12/10/22 0709  Weight: (!) 147 kg (!) 139.3 kg 130.1 kg    Examination: General resting in bed no distress HENT NCAT no JVD Pulm some rhonchi w./ cough. A little more decreased on left Card reg irreg af w/ RVR on tele Abd soft  Ext warm ++ LE edema pulses are strong  Neuro awake, oriented no focal def. No motor def Gu cl yellow   Resolved Hospital Problem list   Acute hypoxic resp failure extubated 11/9 Cardiogenic shock  NSVT  Lactic acidosis  Assessment & Plan:   DOE, Aspiration PNA and L pleural effusion -in setting of ?PE, cardiac arrest, volume overload Plan Continuing diuresis  Repeat CXR today may need to eval w/ [place] and consider thora on 11/12 Complete 7d ceftriaxone   PEA arrest. Etiology not clear. Got TNK for possible   massive PE, but also has known CAD and now acute systolic heart failure (EF 35%) HTN Co-ox in 60s Plan Cont IV heparin  Cont ARB, spironolactone and daily assessment for diuresis  For cardiac cath today  Afib, - RVR Plan Amiodarone  Dig  Tele  Heparin   Hyponatremia -volume overload, HF, slowly improvingh Plan Cont to push diuresis  Daily chems   Prediabetes  Plan Ssi goal 140-180   Anemia H&H stable  Plan Trend cbc Trigger for transfusion < 7   EtOH  use. Prolonged time to ROSC w/ cardiac arrest A little aggravated about not eating but appropriate and oriented.  No obvious w/d. Do wonder about possible residual cognitive impairment  s/p cardiac arrest and prolonged resuscitation  Plan B vit and micronutrient support  CIWA if needed, but likely out of window  SLP for cognitive eval   Best Practice (right click and "Reselect all SmartList Selections" daily)   Diet/type: NPO DVT prophylaxis: systemic heparin GI prophylaxis: H2B Lines: Central line,  Arterial Line, and yes and it is still needed Foley:  Yes, and it is still needed Code Status:  full code Last date of multidisciplinary goals of care discussion [attempts to call brother unanswered, will try again later]

## 2022-12-10 NOTE — TOC Initial Note (Addendum)
Transition of Care [facility]) - Initial/Assessment Note    Patient Details  Name: Jeremiah Dixon MRN: [id number] Date of Birth: 12-Jul-1960  Transition of Care [facility]) CM/SW Contact:    Elliot Cousin, RN Phone Number:[phone] 12/10/2022, 4:35 PM  Clinical Narrative:    CM spoke to pt at bedside. States he works at [employer]. States he take public transportation home. States he will need Taxi voucher for home. Pt reports living alone. Will need note for work. Will continue to follow for dc needs.                Received call from [facility], # [phone] ext [phone] states [employer] #[phone] can assist with arrange [facility] or DME.   Expected Discharge Plan: Home w Home Health Services Barriers to Discharge: Continued Medical Work up   Patient Goals and CMS Choice Patient states their goals for this hospitalization and ongoing recovery are:: wants to recover          Expected Discharge Plan and Services   Discharge Planning Services: CM Consult   Living arrangements for the past 2 months: Apartment                                      Prior Living Arrangements/Services Living arrangements for the past 2 months: Apartment Lives with:: Self Patient language and need for interpreter reviewed:: Yes Do you feel safe going back to the place where you live?: Yes      Need for Family Participation in Patient Care: No (Comment) Care giver support system in place?: No (comment)   Criminal Activity/Legal Involvement Pertinent to Current Situation/Hospitalization: No - Comment as needed  Activities of Daily Living   ADL Screening (condition at time of admission) Independently performs ADLs?: Yes (appropriate for developmental age) Is the patient deaf or have difficulty hearing?: No Does the patient have difficulty seeing, even when wearing glasses/contacts?: No Does the patient have difficulty concentrating, remembering, or making decisions?:  No  Permission Sought/Granted Permission sought to share information with : Case Manager, PCP Permission granted to share information with : Yes, Verbal Permission Granted  Share Information with NAME: Roch Druin     Permission granted to share info w Relationship: brother  Permission granted to share info w Contact Information: [phone]  Emotional Assessment Appearance:: Appears stated age Attitude/Demeanor/Rapport: Engaged Affect (typically observed): Accepting Orientation: : Oriented to Self, Oriented to Place, Oriented to  Time, Oriented to Situation Alcohol / Substance Use: Not Applicable Psych Involvement: No (comment)  Admission diagnosis:  Cardiac arrest [facility]) [I46.9] Patient Active Problem List   Diagnosis Date Noted   Cardiogenic shock (HCC) 12/07/2022   Atrial fibrillation (HCC) 12/07/2022   Cardiac volume overload 12/07/2022   Cardiac arrest (HCC) 12/06/2022   Acute respiratory failure with hypoxia (HCC) 12/06/2022   PCP:  Sandre Kitty, PA-C Pharmacy:   [facility] [id number] - [address]  - [address] [facility] [address] [address] [place] [address] Phone: [phone] Fax: [phone]  [facility] #[id number] [place], [place] - [address] AT [facility] [address] [facility] [address] [place] [address] Phone: [phone] Fax: [phone]     Social Determinants of Health (SDOH) Social History: SDOH Screenings   Food Insecurity: No Food Insecurity (12/06/2022)  Housing: Low Risk  (12/06/2022)  Transportation Needs: Unmet Transportation Needs (  12/06/2022)  Utilities: Not At Risk (12/06/2022)  Alcohol Screen: Medium Risk (12/06/2022)  Financial Resource Strain: Low Risk  (12/06/2022)  Tobacco Use: Medium Risk (07/20/2022)   SDOH Interventions:     Readmission Risk Interventions     No data to display

## 2022-12-11 ENCOUNTER — Inpatient Hospital Stay (HOSPITAL_COMMUNITY): Payer: Managed Care, Other (non HMO)

## 2022-12-11 ENCOUNTER — Other Ambulatory Visit (HOSPITAL_COMMUNITY): Payer: Managed Care, Other (non HMO)

## 2022-12-11 ENCOUNTER — Encounter (HOSPITAL_COMMUNITY)
Admission: EM | Disposition: A | Discharge status: Rehabilitation Facility | Payer: Self-pay | Source: Home / Self Care | Attending: Pulmonary Disease

## 2022-12-11 ENCOUNTER — Encounter (HOSPITAL_COMMUNITY): Payer: Self-pay | Admitting: Internal Medicine

## 2022-12-11 DIAGNOSIS — I2510 Atherosclerotic heart disease of native coronary artery without angina pectoris: Secondary | ICD-10-CM | POA: Diagnosis not present

## 2022-12-11 DIAGNOSIS — I462 Cardiac arrest due to underlying cardiac condition: Secondary | ICD-10-CM | POA: Diagnosis not present

## 2022-12-11 DIAGNOSIS — I4891 Unspecified atrial fibrillation: Secondary | ICD-10-CM

## 2022-12-11 DIAGNOSIS — I509 Heart failure, unspecified: Secondary | ICD-10-CM | POA: Diagnosis not present

## 2022-12-11 DIAGNOSIS — I110 Hypertensive heart disease with heart failure: Secondary | ICD-10-CM | POA: Diagnosis not present

## 2022-12-11 DIAGNOSIS — R570 Cardiogenic shock: Secondary | ICD-10-CM | POA: Diagnosis not present

## 2022-12-11 DIAGNOSIS — I5023 Acute on chronic systolic (congestive) heart failure: Secondary | ICD-10-CM | POA: Diagnosis not present

## 2022-12-11 DIAGNOSIS — I469 Cardiac arrest, cause unspecified: Secondary | ICD-10-CM | POA: Diagnosis not present

## 2022-12-11 DIAGNOSIS — J810 Acute pulmonary edema: Secondary | ICD-10-CM | POA: Diagnosis not present

## 2022-12-11 LAB — BLOOD CULTURE ID PANEL (REFLEXED) - BCID2

## 2022-12-11 LAB — BODY FLUID CELL COUNT WITH DIFFERENTIAL
Eos, Fluid: 0 %
Lymphs, Fluid: 25 %
Monocyte-Macrophage-Serous Fluid: 51 % (ref 50–90)
Neutrophil Count, Fluid: 24 % (ref 0–25)
Total Nucleated Cell Count, Fluid: 1560 uL — ABNORMAL HIGH (ref 0–1000)

## 2022-12-11 LAB — CBC
HCT: 26.1 % — ABNORMAL LOW (ref 39.0–52.0)
Hemoglobin: 8.9 g/dL — ABNORMAL LOW (ref 13.0–17.0)
MCH: 32.4 pg (ref 26.0–34.0)
MCHC: 34.1 g/dL (ref 30.0–36.0)
MCV: 94.9 fL (ref 80.0–100.0)
Platelets: 201 10*3/uL (ref 150–400)
RBC: 2.75 MIL/uL — ABNORMAL LOW (ref 4.22–5.81)
RDW: 13.1 % (ref 11.5–15.5)
WBC: 7.9 10*3/uL (ref 4.0–10.5)
nRBC: 0 % (ref 0.0–0.2)

## 2022-12-11 LAB — COOXEMETRY PANEL
Carboxyhemoglobin: 2.4 % — ABNORMAL HIGH (ref 0.5–1.5)
Methemoglobin: 0.9 % (ref 0.0–1.5)
O2 Saturation: 77.1 %
Total hemoglobin: 9.3 g/dL — ABNORMAL LOW (ref 12.0–16.0)

## 2022-12-11 LAB — PROTEIN, PLEURAL OR PERITONEAL FLUID: Total protein, fluid: 3.6 g/dL

## 2022-12-11 LAB — GLUCOSE, CAPILLARY
Glucose-Capillary: 105 mg/dL — ABNORMAL HIGH (ref 70–99)
Glucose-Capillary: 108 mg/dL — ABNORMAL HIGH (ref 70–99)
Glucose-Capillary: 115 mg/dL — ABNORMAL HIGH (ref 70–99)
Glucose-Capillary: 119 mg/dL — ABNORMAL HIGH (ref 70–99)
Glucose-Capillary: 123 mg/dL — ABNORMAL HIGH (ref 70–99)
Glucose-Capillary: 141 mg/dL — ABNORMAL HIGH (ref 70–99)

## 2022-12-11 LAB — BASIC METABOLIC PANEL
Anion gap: 8 (ref 5–15)
BUN: 9 mg/dL (ref 8–23)
CO2: 29 mmol/L (ref 22–32)
Calcium: 8.4 mg/dL — ABNORMAL LOW (ref 8.9–10.3)
Chloride: 91 mmol/L — ABNORMAL LOW (ref 98–111)
Creatinine, Ser: 0.9 mg/dL (ref 0.61–1.24)
GFR, Estimated: >60 mL/min (ref >60)
Glucose, Bld: 118 mg/dL — ABNORMAL HIGH (ref 70–99)
Potassium: 4.1 mmol/L (ref 3.5–5.1)
Sodium: 128 mmol/L — ABNORMAL LOW (ref 135–145)

## 2022-12-11 LAB — HEPARIN LEVEL (UNFRACTIONATED)
Heparin Unfractionated: 0.27 [IU]/mL — ABNORMAL LOW (ref 0.30–0.70)
Heparin Unfractionated: 0.39 [IU]/mL (ref 0.30–0.70)

## 2022-12-11 LAB — ECHOCARDIOGRAM LIMITED
Height: 74 in
Weight: 4631.42 [oz_av]

## 2022-12-11 LAB — LACTATE DEHYDROGENASE, PLEURAL OR PERITONEAL FLUID: LD, Fluid: 409 U/L — ABNORMAL HIGH (ref 3–23)

## 2022-12-11 LAB — GLUCOSE, PLEURAL OR PERITONEAL FLUID: Glucose, Fluid: 115 mg/dL

## 2022-12-11 LAB — MAGNESIUM: Magnesium: 2 mg/dL (ref 1.7–2.4)

## 2022-12-11 LAB — PROTEIN, TOTAL: Total Protein: 3.7 g/dL — ABNORMAL LOW (ref 6.5–8.1)

## 2022-12-11 LAB — LACTATE DEHYDROGENASE: LDH: 393 U/L — ABNORMAL HIGH (ref 98–192)

## 2022-12-11 SURGERY — TRANSESOPHAGEAL ECHOCARDIOGRAM (TEE) (CATHLAB)
Anesthesia: Monitor Anesthesia Care

## 2022-12-11 MED ORDER — PERFLUTREN LIPID MICROSPHERE
1.0000 mL | INTRAVENOUS | Status: AC | PRN
  Administered 2022-12-11: 4 mL via INTRAVENOUS

## 2022-12-11 MED ORDER — TORSEMIDE 20 MG PO TABS
40.0000 mg | ORAL_TABLET | Freq: Every day | ORAL | Status: DC
  Administered 2022-12-11: 40 mg via ORAL
  Filled 2022-12-11: qty 2

## 2022-12-11 MED ORDER — GUAIFENESIN ER 600 MG PO TB12
1200.0000 mg | ORAL_TABLET | Freq: Two times a day (BID) | ORAL | Status: DC
  Administered 2022-12-11 – 2022-12-15 (×9): 1200 mg via ORAL
  Filled 2022-12-11 (×9): qty 2

## 2022-12-11 MED ORDER — HYDROCOD POLI-CHLORPHE POLI ER 10-8 MG/5ML PO SUER
5.0000 mL | Freq: Two times a day (BID) | ORAL | Status: DC
  Administered 2022-12-11 – 2022-12-18 (×15): 5 mL via ORAL
  Filled 2022-12-11 (×15): qty 5

## 2022-12-11 MED ORDER — METOPROLOL SUCCINATE ER 25 MG PO TB24
25.0000 mg | ORAL_TABLET | Freq: Two times a day (BID) | ORAL | Status: DC
  Administered 2022-12-11 – 2022-12-12 (×3): 25 mg via ORAL
  Filled 2022-12-11 (×3): qty 1

## 2022-12-11 MED ORDER — KETOROLAC TROMETHAMINE 15 MG/ML IJ SOLN
15.0000 mg | Freq: Once | INTRAMUSCULAR | Status: AC
  Administered 2022-12-11: 15 mg via INTRAVENOUS
  Filled 2022-12-11: qty 1

## 2022-12-11 MED ORDER — ENSURE ENLIVE PO LIQD
237.0000 mL | Freq: Three times a day (TID) | ORAL | Status: DC
  Administered 2022-12-11 – 2022-12-19 (×14): 237 mL via ORAL

## 2022-12-11 MED ORDER — SODIUM CHLORIDE 0.9% FLUSH
10.0000 mL | Freq: Two times a day (BID) | INTRAVENOUS | Status: DC
  Administered 2022-12-11 – 2022-12-19 (×14): 10 mL

## 2022-12-11 MED ORDER — HEPARIN (PORCINE) 25000 UT/250ML-% IV SOLN
2300.0000 [IU]/h | INTRAVENOUS | Status: DC
  Administered 2022-12-11 – 2022-12-12 (×2): 2300 [IU]/h via INTRAVENOUS
  Filled 2022-12-11 (×3): qty 250

## 2022-12-11 NOTE — Progress Notes (Signed)
PHARMACY - PHYSICIAN COMMUNICATION CRITICAL VALUE ALERT - BLOOD CULTURE IDENTIFICATION (BCID)  Jeremiah Dixon is an 62 y.o. male who presented to [facility] on 12/06/2022 with PEA arrest. Started on Ceftriaxone for aspiration pneumonia and left pleural effusion.   One set of blood culture on 11/9 speciated corynebacterium glucuronolyticum and other set showing GPC with no identifcation via the B CID panel. Both in aerobic bottles of each set.   WBC 7.9, last lactate 1.1, afebrile  Name of physician (or Provider) Contacted: Dr. Marlane Mingle  Current antibiotics: Ceftriaxone 2g IV every 24 hours  Changes to prescribed antibiotics recommended:   -Continue course of ceftriaxone 2g IV every 24 hours  Results for orders placed or performed during the hospital encounter of 12/06/22  Blood Culture ID Panel (Reflexed) (Collected: 12/06/2022  3:36 AM)  Result Value Ref Range   Enterococcus faecalis NOT DETECTED NOT DETECTED   Enterococcus Faecium NOT DETECTED NOT DETECTED   Listeria monocytogenes NOT DETECTED NOT DETECTED   Staphylococcus species NOT DETECTED NOT DETECTED   Staphylococcus aureus (BCID) NOT DETECTED NOT DETECTED   Staphylococcus epidermidis NOT DETECTED NOT DETECTED   Staphylococcus lugdunensis NOT DETECTED NOT DETECTED   Streptococcus species NOT DETECTED NOT DETECTED   Streptococcus agalactiae NOT DETECTED NOT DETECTED   Streptococcus pneumoniae NOT DETECTED NOT DETECTED   Streptococcus pyogenes NOT DETECTED NOT DETECTED   A.calcoaceticus-baumannii NOT DETECTED NOT DETECTED   Bacteroides fragilis NOT DETECTED NOT DETECTED   Enterobacterales NOT DETECTED NOT DETECTED   Enterobacter cloacae complex NOT DETECTED NOT DETECTED   Escherichia coli NOT DETECTED NOT DETECTED   Klebsiella aerogenes NOT DETECTED NOT DETECTED   Klebsiella oxytoca NOT DETECTED NOT DETECTED   Klebsiella pneumoniae NOT DETECTED NOT DETECTED   Proteus species NOT DETECTED NOT DETECTED   Salmonella  species NOT DETECTED NOT DETECTED   Serratia marcescens NOT DETECTED NOT DETECTED   Haemophilus influenzae NOT DETECTED NOT DETECTED   Neisseria meningitidis NOT DETECTED NOT DETECTED   Pseudomonas aeruginosa NOT DETECTED NOT DETECTED   Stenotrophomonas maltophilia NOT DETECTED NOT DETECTED   Candida albicans NOT DETECTED NOT DETECTED   Candida auris NOT DETECTED NOT DETECTED   Candida glabrata NOT DETECTED NOT DETECTED   Candida krusei NOT DETECTED NOT DETECTED   Candida parapsilosis NOT DETECTED NOT DETECTED   Candida tropicalis NOT DETECTED NOT DETECTED   Cryptococcus neoformans/gattii NOT DETECTED NOT DETECTED    Arabella Merles, PharmD. Clinical Pharmacist 12/11/2022 5:37 AM

## 2022-12-11 NOTE — Progress Notes (Signed)
PHARMACY - ANTICOAGULATION CONSULT NOTE  Pharmacy Consult for heparin Indication: atrial fibrillation, pulmonary embolism  Allergies  Allergen Reactions   Bee Venom Palpitations   Diclofenac Diarrhea   Amoxicillin Diarrhea and Nausea Only   Lisinopril Cough   Pantoprazole Nausea Only    Other Reaction(s): chest pain    Patient Measurements: Height: 6\' 2"  (188 cm) Weight: 130.1 kg (286 lb 13.1 oz) IBW/kg (Calculated) : 82.2 Heparin Dosing Weight: 110kg  Vital Signs: Temp: 98.4 F (36.9 C) (11/12 0334) Temp Source: Oral (11/12 0334) BP: 101/64 (11/12 0400) Pulse Rate: 109 (11/12 0400)  Labs: Recent Labs    12/09/22 0459 12/09/22 1007 12/10/22 0416 12/10/22 1722 12/10/22 1727 12/10/22 1919 12/11/22 0406  HGB 8.7*  --  9.0*   < > 7.5* 9.1* 8.9*  HCT 25.3*  --  26.3*   < > 22.0* 26.6* 26.1*  PLT 148*  --  171  --   --  179 201  HEPARINUNFRC 0.38  --  0.38  --   --   --  0.27*  CREATININE 0.83   < > 0.97  --   --  0.95 0.90   < > = values in this interval not displayed.    Estimated Creatinine Clearance: 122.1 mL/min (by C-G formula based on SCr of 0.9 mg/dL).   Medical History: Past Medical History:  Diagnosis Date   HLD (hyperlipidemia)    HTN (hypertension)    MI (myocardial infarction) (HCC)      Assessment: 62yo male with had PEA arrest on arrival to ED, tenecteplase was given for suspected PE. ROSC was obtained. CXR w/ pulmonary edema and EKG w/ Afib. No anticoagulation prior to admission. Pharmacy consulted for heparin for afib and possible PE.    Heparin level was previously therapeutic at 0.38, on heparin infusion at 2200 units/hr. CBC stable with Hgb 9.0, plt 171. Underwent cardiac cath finding severe 3v CAD w/ CTO of ostLCx and midRCA - plan to restart heparin 2 hours after TR band removed (documented on 11/11~1945).  11/12 AM: heparin level 0.27 (subtherapeutic) on 2200 units/hr (drawn ~5h after heparin start given started at 2300). Per RN, no  signs/symptoms of bleeding or issues with the heparin infusion. Given level was drawn slightly early, will increase moderately. Hgb 9s, plts 200s  Goal of Therapy:  Heparin level 0.3-0.7 units/ml Monitor platelets by anticoagulation protocol: Yes   Plan:  Increase heparin infusion to 2300 units/hr 6h heparin level Monitor daily heparin levels, CBC, and signs/symptoms of bleeding  Thank you for allowing pharmacy to participate in this patient's care,  Arabella Merles, PharmD. Clinical Pharmacist 12/11/2022 5:25 AM

## 2022-12-11 NOTE — Progress Notes (Signed)
PHARMACY - ANTICOAGULATION CONSULT NOTE  Pharmacy Consult for heparin Indication: atrial fibrillation, pulmonary embolism  Allergies  Allergen Reactions   Bee Venom Palpitations   Diclofenac Diarrhea   Amoxicillin Diarrhea and Nausea Only   Lisinopril Cough   Pantoprazole Nausea Only    Other Reaction(s): chest pain    Patient Measurements: Height: 6\' 2"  (188 cm) Weight: 131.3 kg (289 lb 7.4 oz) IBW/kg (Calculated) : 82.2 Heparin Dosing Weight: 110kg  Vital Signs: Temp: 98.8 F (37.1 C) (11/12 1200) Temp Source: Oral (11/12 1200) BP: 105/72 (11/12 1230) Pulse Rate: 97 (11/12 1230)  Labs: Recent Labs    12/10/22 0416 12/10/22 1722 12/10/22 1727 12/10/22 1919 12/11/22 0406 12/11/22 1124  HGB 9.0*   < > 7.5* 9.1* 8.9*  --   HCT 26.3*   < > 22.0* 26.6* 26.1*  --   PLT 171  --   --  179 201  --   HEPARINUNFRC 0.38  --   --   --  0.27* 0.39  CREATININE 0.97  --   --  0.95 0.90  --    < > = values in this interval not displayed.    Estimated Creatinine Clearance: 122.5 mL/min (by C-G formula based on SCr of 0.9 mg/dL).   Medical History: Past Medical History:  Diagnosis Date   HLD (hyperlipidemia)    HTN (hypertension)    MI (myocardial infarction) (HCC)      Assessment: 62yo male with had PEA arrest on arrival to ED, tenecteplase was given for suspected PE. ROSC was obtained. Chest Xray w/ pulmonary edema and EKG w/ Afib. No anticoagulation prior to admission. Pharmacy consulted for heparin for afib and possible PE.    Heparin level 0.39 is therapeutic with heparin running at 2300 units/hr. Hgb (8.9) and platelet count (201) are stable. Per RN, no report of pauses, issues with the line, or signs of bleeding. No current plan to transition to oral anticoagulation in anticipation of potential CABG.  Goal of Therapy:  Heparin level 0.3-0.7 units/ml Monitor platelets by anticoagulation protocol: Yes   Plan:  Increase heparin infusion to 2300 units/hr Monitor  daily heparin levels, CBC, and signs/symptoms of bleeding F/u plan for potential CABG   Thank you for allowing pharmacy to participate in this patient's care  Ernestene Kiel, PharmD PGY1 Pharmacy Resident  Please check AMION for all [facility] Pharmacy phone numbers After 10:00 PM, call Main Pharmacy [phone] 12/11/2022 12:41 PM

## 2022-12-11 NOTE — Progress Notes (Addendum)
eLink Physician-Brief Progress Note Patient Name: Jeremiah Dixon DOB: 28-Aug-1960 MRN: [id number]   Date of Service  12/11/2022  HPI/Events of Note  62 y.o. male who presented to [facility] on 12/06/2022 with PEA arrest. Started on Ceftriaxone for aspiration pneumonia and left pleural effusion.    One set of blood culture on 11/9 speciated corynebacterium glucuronolyticum and other set showing GPC with no identifcation via the B CID panel. Both in aerobic bottles of each set.    WBC 7.9, last lactate 1.1, afebrile   eICU Interventions  Not immunocompromised, so Advised to stop ceftrioxone for now. Watch for new fever, leukocytosis.      Intervention Category Intermediate Interventions: Other: Minor Interventions: Communication with other healthcare providers and/or family  Ranee Gosselin 12/11/2022, 5:51 AM

## 2022-12-11 NOTE — Consult Note (Addendum)
[address]       [place] [address]             [phone]        [facility] Medical Record #[id number] Date of Birth: 1960-06-17  Referring: Bensimhon Primary Care: Sandre Kitty, PA-C Primary Cardiologist:None  Chief Complaint:    Chief Complaint  Patient presents with   Respiratory Arrest    History of Present Illness:      Jeremiah Dixon is a 62 yo male with PMH of CAD, morbid obesity, HTN, HLD,  Alcohol abuse, and Prediabetes.  He was brought to [facility] ED via EMS on 11/7 after complaints of chest pain and shortness of breath.  Upon arrival to the ED the patient's sats were 60% and he was on NRB.  He developed PEA arrest and CPR was initiated.  He required emergent intubation and was treated with TNK for concern of possible PE.  CXR obtained showed pulmonary edema.  EKG revealed A. Fib with rates in the 130s.  He was awake and following commands and admitted to the ICU.  He was placed on epinephrine, levo, and vaso for cardiogenic shock.  AHF team was consulted for shock and possible ECMO cannulation.  Post operative echocardiogram showed reduced EF of 30-35% with moderately dilated RV.  They recommended initiation of Milrinone.  They felt with recent administration of lytics it would be ideal to avoid ECMO and try and stabilize with pressors, however if need arose this was an option.  He developed hypotension post Milrinone administration this was discontinued and he was switched to Dobutamine.  He underwent right heart cath on 11/7. His respiratory condition improved and he was able to be extubated on 11/8.  He has been weaned off all pressors.  He developed repeat episodes of Atrial Fibrillation requiring Amiodarone drip.  They have been adding GDMT as tolerated.  He has been treated with ABX for possible pneumonia.  Doppler studies have ruled out LE DVT.  Since he stabilized patient was taken for repeat RHC and LHC this revealed 3V  CAD.  It was felt he would likely benefit from possible coronary bypass grafting.  Currently the patient feels like he is making improvements in small amounts considering what he has been through.  He denies chest pain and shortness of breath.  He feels like he has "fluid" in his lungs for which he is taking mucinex and breathing device, but states it comes out of nowhere.  The patient states he is fairly active.  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  Previously he was working at [employer] at [employer] which was a strenuous job for him.  He denies current smoking, but was a former of 1ppd quiting 10-15 years ago.  He is a heavy drink of daily use, states he drinks 2 40s per day of 8% ABV  prior to admission.  He denies family history of CAD.  Current Activity/ Functional Status: Patient was independent with mobility/ambulation, transfers, ADL's, IADL's.   Zubrod Score: At the time of surgery this patient's most appropriate activity status/level should be described as: []     0    Normal activity, no symptoms [x]     1    Restricted in physical strenuous activity but ambulatory, able to do out light work []     2    Ambulatory and capable of self care, unable to  do work activities, up and about                 more than 50%  Of the time                            []     3    Only limited self care, in bed greater than 50% of waking hours []     4    Completely disabled, no self care, confined to bed or chair []     5    Moribund  Past Medical History:  Diagnosis Date   HLD (hyperlipidemia)    HTN (hypertension)    MI (myocardial infarction) [facility])     Past Surgical History:  Procedure Laterality Date   RIGHT HEART CATH N/A 12/06/2022   Procedure: RIGHT HEART CATH;  Surgeon: Laurey Morale, MD;  Location: [facility] INVASIVE CV LAB;  Service: Cardiovascular;  Laterality: N/A;   RIGHT/LEFT HEART CATH AND CORONARY ANGIOGRAPHY N/A 12/10/2022   Procedure: RIGHT/LEFT HEART CATH  AND CORONARY ANGIOGRAPHY;  Surgeon: Dolores Patty, MD;  Location: [facility] INVASIVE CV LAB;  Service: Cardiovascular;  Laterality: N/A;    Social History   Tobacco Use  Smoking Status Former   Types: Cigarettes  Smokeless Tobacco Never    Social History   Substance and Sexual Activity  Alcohol Use Yes   Alcohol/week: 8.0 standard drinks of alcohol   Types: 8 Cans of beer per week     Allergies  Allergen Reactions   Bee Venom Palpitations   Diclofenac Diarrhea   Amoxicillin Diarrhea and Nausea Only   Lisinopril Cough   Pantoprazole Nausea Only    Other Reaction(s): chest pain    Current Facility-Administered Medications  Medication Dose Route Frequency Provider Last Rate Last Admin   0.9 %  sodium chloride infusion   Intravenous Continuous Robbie Lis M, PA-C 20 mL/hr at 12/11/22 0900 Infusion Verify at 12/11/22 0900   0.9 %  sodium chloride infusion  250 mL Intravenous PRN Bensimhon, Bevelyn Buckles, MD   Stopped at 12/10/22 2243   acetaminophen (TYLENOL) tablet 650 mg  650 mg Oral Q4H PRN Bensimhon, Bevelyn Buckles, MD       amiodarone (NEXTERONE PREMIX) 360-4.14 MG/200ML-% (1.8 mg/mL) IV infusion  60 mg/hr Intravenous Continuous Bensimhon, Bevelyn Buckles, MD 33.3 mL/hr at 12/11/22 0900 60 mg/hr at 12/11/22 0900   aspirin chewable tablet 81 mg  81 mg Oral Daily Bensimhon, Bevelyn Buckles, MD   81 mg at 12/09/22 4696   Chlorhexidine Gluconate Cloth 2 % PADS 6 each  6 each Topical Daily Bensimhon, Bevelyn Buckles, MD   6 each at 12/10/22 0603   chlorpheniramine-HYDROcodone (TUSSIONEX) 10-8 MG/5ML suspension 5 mL  5 mL Oral Q12H Robbie Lis M, PA-C   5 mL at 12/11/22 0736   digoxin (LANOXIN) tablet 0.125 mg  0.125 mg Oral Daily Bensimhon, Bevelyn Buckles, MD   0.125 mg at 12/10/22 0942   famotidine (PEPCID) tablet 20 mg  20 mg Oral BID Bensimhon, Bevelyn Buckles, MD   20 mg at 12/10/22 2106   folic acid (FOLVITE) tablet 1 mg  1 mg Oral Daily Bensimhon, Bevelyn Buckles, MD   1 mg at 12/10/22 0945   guaiFENesin  (MUCINEX) 12 hr tablet 1,200 mg  1,200 mg Oral BID Robbie Lis M, PA-C       heparin ADULT infusion 100 units/mL (25000 units/24mL)  2,300 Units/hr Intravenous Continuous Arabella Merles, RPH 23  mL/hr at 12/11/22 0900 2,300 Units/hr at 12/11/22 0900   insulin aspart (novoLOG) injection 0-15 Units  0-15 Units Subcutaneous Q4H Bensimhon, Bevelyn Buckles, MD   2 Units at 12/10/22 2344   lidocaine (LIDODERM) 5 % 1 patch  1 patch Transdermal Q24H Bensimhon, Bevelyn Buckles, MD   1 patch at 12/09/22 1715   losartan (COZAAR) tablet 25 mg  25 mg Oral Daily Bensimhon, Bevelyn Buckles, MD   25 mg at 12/10/22 2956   metoprolol succinate (TOPROL-XL) 24 hr tablet 25 mg  25 mg Oral BID Bensimhon, Bevelyn Buckles, MD       multivitamin with minerals tablet 1 tablet  1 tablet Oral Daily Bensimhon, Bevelyn Buckles, MD   1 tablet at 12/10/22 0945   ondansetron (ZOFRAN) injection 4 mg  4 mg Intravenous Q6H PRN Bensimhon, Bevelyn Buckles, MD       Oral care mouth rinse  15 mL Mouth Rinse PRN Bensimhon, Bevelyn Buckles, MD       oxyCODONE (Oxy IR/ROXICODONE) immediate release tablet 5 mg  5 mg Oral Q6H PRN Bensimhon, Bevelyn Buckles, MD   5 mg at 12/10/22 2222   polyethylene glycol (MIRALAX / GLYCOLAX) packet 17 g  17 g Oral Daily Bensimhon, Bevelyn Buckles, MD   17 g at 12/09/22 0933   rosuvastatin (CRESTOR) tablet 20 mg  20 mg Oral Daily Bensimhon, Bevelyn Buckles, MD   20 mg at 12/10/22 2130   sodium chloride flush (NS) 0.9 % injection 3 mL  3 mL Intravenous Q12H Bensimhon, Bevelyn Buckles, MD   3 mL at 12/10/22 2116   sodium chloride flush (NS) 0.9 % injection 3 mL  3 mL Intravenous PRN Bensimhon, Bevelyn Buckles, MD       spironolactone (ALDACTONE) tablet 25 mg  25 mg Oral Daily Bensimhon, Bevelyn Buckles, MD   25 mg at 12/10/22 1000   thiamine (VITAMIN B1) tablet 100 mg  100 mg Oral Daily Bensimhon, Bevelyn Buckles, MD   100 mg at 12/10/22 0945   Or   thiamine (VITAMIN B1) injection 100 mg  100 mg Intravenous Daily Bensimhon, Bevelyn Buckles, MD   100 mg at 12/09/22 0916   torsemide (DEMADEX) tablet 40 mg  40  mg Oral Daily Robbie Lis M, PA-C       traZODone (DESYREL) tablet 100 mg  100 mg Oral QHS PRN Bensimhon, Bevelyn Buckles, MD   100 mg at 12/09/22 2213    Medications Prior to Admission  Medication Sig Dispense Refill Last Dose   amLODipine (NORVASC) 10 MG tablet Take 10 mg by mouth daily.   12/05/2022   esomeprazole (NEXIUM) 40 MG capsule Take 1 capsule (40 mg total) by mouth 2 (two) times daily before a meal. 60 capsule 5 12/05/2022   losartan (COZAAR) 100 MG tablet Take 50 mg by mouth daily.   12/05/2022   lovastatin (MEVACOR) 40 MG tablet Take 40 mg by mouth daily.   12/05/2022   metoprolol tartrate (LOPRESSOR) 50 MG tablet Take 50 mg by mouth 2 (two) times daily.   12/05/2022   nitroGLYCERIN (NITROSTAT) 0.4 MG SL tablet Place 0.4 mg under the tongue every 5 (five) minutes as needed. (Patient not taking: Reported on 07/20/2022)       No family history on file.   Review of Systems:     Cardiac Review of Systems: Y or  [    ]= no  Chest Pain [ Y   ]  Resting SOB [ Y  ] Exertional SOB  [  ]  Orthopnea [  ]   Pedal Edema [   ]    Palpitations [ Y ] Syncope  [  ]   Presyncope [   ]  General Review of Systems: [Y] = yes [  ]=no Constitional: recent weight change [  ]; anorexia [  ]; fatigue [  ]; nausea [ Y ]; night sweats [  ]; fever [  ]; or chills [  ]                                                               Dental: Last Dentist visit: tooth pulled about 6-8 months ago  Eye : blurred vision [  ]; diplopia [   ]; vision changes [  ];  Amaurosis fugax[  ]; Resp: cough [ Y ];  wheezing[  ];  hemoptysis[N  ]; shortness of breath[ Y ]; paroxysmal nocturnal dyspnea[  ]; dyspnea on exertion[ Y ]; or orthopnea[  ];  GI:  gallstones[  ], vomiting[ Y ];  dysphagia[  ]; melena[  ];  hematochezia [  ]; heartburn[  ];   Hx of  Colonoscopy[  ]; GU: kidney stones [  ]; hematuria[  ];   dysuria [  ];  nocturia[  ];  history of     obstruction [  ]; urinary frequency [  ]             Skin: rash,  swelling[ Y ];, hair loss[  ];  peripheral edema[Y  ];  or itching[  ]; bilateral varicose veins Musculosketetal: myalgias[  ];  joint swelling[  ];  joint erythema[  ];  joint pain[  ];  back pain[  ];  Heme/Lymph: bruising[  ];  bleeding[  ];  anemia[  ];  Neuro: TIA[  ];  headaches[  ];  stroke[ N ];  vertigo[  ];  seizures[  ];   paresthesias[  ];  difficulty walking[  ];  Psych:depression[  ]; anxiety[  ];  Endocrine: diabetes[ N ];  thyroid dysfunction[ N ];  Physical Exam: BP 119/78   Pulse (!) 110   Temp 98.5 F (36.9 C)   Resp 18   Ht 6\' 2"  (1.88 m)   Wt 131.3 kg   SpO2 94%   BMI 37.16 kg/m   General appearance: alert, cooperative, and no distress Head: Normocephalic, without obvious abnormality, atraumatic Neck: no adenopathy, no carotid bruit, no JVD, supple, symmetrical, trachea midline, and thyroid not enlarged, symmetric, no tenderness/mass/nodules Resp: diminished breath sounds bibasilar Cardio: irregularly irregular rhythm GI: soft, non-tender; bowel sounds normal; no masses,  no organomegaly Extremities: per patient varicose veins bilaterally, unable to assess with wraps in place Neurologic: Grossly normal  Diagnostic Studies & Laboratory data:     Recent Radiology Findings:   CARDIAC CATHETERIZATION  Result Date: 12/10/2022   Prox RCA to Mid RCA lesion is 100% stenosed.   Prox RCA lesion is 60% stenosed.   Ost LAD lesion is 70% stenosed.   Mid LM to Dist LM lesion is 70% stenosed.   Ost Cx lesion is 100% stenosed.   Mid LAD lesion is 95% stenosed.   Prox LAD to Mid LAD lesion is 50% stenosed.   2nd Diag lesion is 40% stenosed. Findings: Ao = 101/66 (79)  LV = 99/12 RA = 8 RV = 45/10 PA = 53/15 (29) PCW = 12 Fick cardiac output/index = 8.1/3.2 PVR = 2.1 WU Ao sat = 89% PA sat = 55%, 54% PAPi = 4.6 Assessment: 1. Severe 3v heavily calcified CAD with CTO of Ostial LCx and midRCA. Mid LAD 95% 2. Unable to assess LVEF by V-gram 3. Mild to moderate PAH with normal PCWP  and output Plan/Discussion: Needs CABG but with recent massive PE will need to discuss timing with TCTS. Will consult. Repeat echo Arvilla Meres, MD 5:54 PM  DG Chest Port 1 View  Result Date: 12/10/2022 CLINICAL DATA:  Pneumonia EXAM: PORTABLE CHEST 1 VIEW COMPARISON:  X-ray 12/08/2022.  Older exams as well. FINDINGS: Interval removal of the Swan-Ganz catheter. No right-sided pneumothorax. Stable left IJ catheter. Enlarged cardiopericardial silhouette. Bilateral pleural effusions and opacities, left-greater-than-right. No edema. Overlapping cardiac leads. Degenerative changes along the spine. IMPRESSION: Interval removal of the right IJ line.  No pneumothorax. Bilateral pleural effusions and opacities, left-greater-than-right. Recommend continued follow up Electronically Signed   By: Karen Kays M.D.   On: 12/10/2022 17:04     I have independently reviewed the above radiologic studies and discussed with the patient   Recent Lab Findings: Lab Results  Component Value Date   WBC 7.9 12/11/2022   HGB 8.9 (L) 12/11/2022   HCT 26.1 (L) 12/11/2022   PLT 201 12/11/2022   GLUCOSE 118 (H) 12/11/2022   CHOL 197 06/07/2009   TRIG 73 12/07/2022   HDL 63 06/07/2009   LDLCALC 115 (H) 06/07/2009   ALT 47 (H) 12/09/2022   AST 50 (H) 12/09/2022   NA 128 (L) 12/11/2022   K 4.1 12/11/2022   CL 91 (L) 12/11/2022   CREATININE 0.90 12/11/2022   BUN 9 12/11/2022   CO2 29 12/11/2022   TSH 6.918 (H) 12/06/2022   INR 1.5 (H) 12/06/2022   HGBA1C 5.6 12/06/2022    Assessment / Plan:     62 yo male presented with respiratory/PEA arrest felt to be due to large PE,  treated with lytic therapy.  He has been managed by CCM and advanced heart failure.  Currently he is off all IV support.  He remains on IV Amiodarone for Atrial Fibrillation that may require cardioversion at some point.  He is also being treated with ABX for pneumonia.  He has reduced EF in setting of severe 3V CAD.  He has daily alcohol abuse.   At best patient would be at higher risk for surgical revascularization.  The patient will be evaluated by Dr. Laneta Simmers to determine if coronary bypass grafting would be appropriate or if PCI would be safest option.    I  spent 55 minutes counseling the patient face to face.  Jeremiah Barrett, PA-C 12/11/2022 10:14 AM   Chart reviewed, patient examined, agree with above. This 63 year old gentleman has multiple risk factors for heart disease including morbid obesity, hypertension, hyperlipidemia, and prediabetes.  He was evaluated by Dr. Sharyn Lull for chest discomfort in the spring 2023 with a nuclear stress test which showed no reversible ischemia with potential small scar in the apical segment of the inferolateral wall.  Ejection fraction was 57% at that time.  No further workup was performed.  He now presents with a few week history of progressive shortness of breath and lower extremity edema which reached a crescendo on 12/06/2022 prompting call to EMS when he felt like he was going to stop breathing.  He reported some episodes of  substernal chest discomfort over the past few months that he thought was heartburn and were sometimes relieved with antacids and sometimes not.  He has also had progressive exertional fatigue.  When he was seen by EMS he was found to be profoundly hypoxemic.  On presentation to the emergency room he was intubated for acute hypoxemic respiratory failure, suffered PEA arrest requiring CPR.  He was treated for cardiogenic shock with multiple vasopressors.  It was felt that he may have suffered a massive PE and he was treated with thrombolytic therapy in the emergency department.  He never had a CT scan.  Chest x-ray showed pulmonary edema.  Troponin I was 2059 and 2815.  BNP was 403.  He improved with medical therapy and was subsequently extubated and weaned off inotropes.  2D echocardiogram on presentation showed an ejection fraction of 35 to 40% with a dilated LV to 5.8 cm and moderate  RV systolic dysfunction with mild RV enlargement.  There was trivial MR and TR.  RHC on presentation showed PA pressure of 49/18 with a mean of 32.  Mean wedge pressure was 10.  Mean right atrial pressure was 8.  Cardiac index was 2.5 with a PVR of 3.5WU.  PAPI was 3.87.  He subsequently underwent cardiac catheterization yesterday which showed 70% mid to distal left main stenosis.  There was 70% ostial LAD stenosis and 95% mid LAD stenosis of a large distal vessel.  There was a moderate-sized diagonal vessel with 40% stenosis.  The left circumflex was occluded at its ostium with filling of a moderate to large marginal branch by collaterals from the LAD.  The RCA was occluded in the proximal to midportion with no significant distal vessel seen.  There was a moderate-sized acute marginal branch.  PA pressure was 53/15 with a mean of 29.  Wedge pressure was 12 and right atrial pressure was 8.  PA saturation was measured at 55%.  I think the primary problem prompting his decline admission was worsening congestive heart failure with pulmonary edema, hypoxemia and marked lower extremity edema.  He has been having intermittent chest discomfort at home which sounds like it could be angina.  I think his best long-term prognosis is going to be with coronary bypass graft surgery to the LAD, diagonal, and OM.  I am not sure that the distal right coronary circulation is graftable.  I think the other option would be to stent his LAD although that is not the best long-term option given the size of his left circumflex system and the left main stenosis.  Ideally it would be best to get him completely tuned up before undergoing coronary bypass surgery.  He does have a significant left pleural effusion which would benefit from drainage.  CCM is already on top of that.  I reviewed the catheterization findings and treatment alternatives with him and answered his questions.  I told him I would discuss the case further with Dr.  Gala Romney and then we would discuss it with him and decide which way he wants to go.  [name], MD

## 2022-12-11 NOTE — Progress Notes (Signed)
   12/11/22 1100  Spiritual Encounters  Type of Visit Initial  Care provided to: Patient  Conversation partners present during encounter Nurse  Referral source Nurse (RN/NT/LPN)  Reason for visit Advance directives  OnCall Visit No   Chaplain responded to request for AD. There was no family present at bedside. Chaplain assisted patient with AD education. Patient will page Ch when he is ready. Chaplain remains available when needed.

## 2022-12-11 NOTE — Progress Notes (Signed)
Pocus  Mod to large left pleural effusion Heparin placed on hold.  Spoke w/ CVTS they are in support of thora Plan Hold heparin x 2 hrs Then thora this afternoon  Simonne Martinet ACNP-BC [facility] Pulmonary/Critical Care Pager # [phone] OR # [phone] if no answer

## 2022-12-11 NOTE — Progress Notes (Signed)
Doing well s/p thora Post procedure cxr reviewed. Pleural effusion remarkably reduced w/ much improved aeration on left. No PTX Plan Resume heparin no bolus at 2000 Cxr in am

## 2022-12-11 NOTE — Procedures (Signed)
Thoracentesis  Procedure Note  Jeremiah Dixon  [id number]  [birth year]  Date:12/11/22  Time:6:23 PM   Provider Performing:Lani Mendiola E  Cherlynn Polo   Procedure: Thoracentesis with imaging guidance ([id number])  Indication(s) Pleural Effusion  Consent Risks of the procedure as well as the alternatives and risks of each were explained to the patient and/or caregiver.  Consent for the procedure was obtained and is signed in the bedside chart  Anesthesia Topical only with 1% lidocaine    Time Out Verified patient identification, verified procedure, site/side was marked, verified correct patient position, special equipment/implants available, medications/allergies/relevant history reviewed, required imaging and test results available.   Sterile Technique Maximal sterile technique including full sterile barrier drape, hand hygiene, sterile gown, sterile gloves, mask, hair covering, sterile ultrasound probe cover (if used).  Procedure Description Ultrasound was used to identify appropriate pleural anatomy for placement and overlying skin marked.  Area of drainage cleaned and draped in sterile fashion. Lidocaine was used to anesthetize the skin and subcutaneous tissue.  1000 cc's of bloody appearing fluid was drained from the left pleural space. Catheter then removed and bandaid applied to site.   Complications/Tolerance None; patient tolerated the procedure well. Chest X-ray is ordered to confirm no post-procedural complication.   EBL Minimal   Specimen(s) Pleural fluid   Under direct supervision of Anders Simmonds, NP

## 2022-12-11 NOTE — Progress Notes (Addendum)
Patient ID: Kemo Finkel, male   DOB: 1960/03/09, 62 y.o.   MRN: [id number]     Advanced Heart Failure Rounding Note  PCP-Cardiologist: None   Subjective:    Extubated 11/8, now on 2L [place].   Off all inotropic and pressor support. Co-ox 77%.   R/LHC yesterday showed severe 3V disease, mild-mod PAH and normal output.   Brisk UOP again yesterday, 8.9L out. CVP 5-6  Scr 0.90 K 4.1 Mg 2.0   Remains in Afib, low 100s-110s. SPBs 110s.   Main complaint is persistent cough + chest wall pain w/ cough. No dyspnea.    RHC Findings:   Ao = 101/66 (79)  LV = 99/12 RA = 8 RV = 45/10 PA = 53/15 (29) PCW = 12 Fick cardiac output/index = 8.1/3.2 PVR = 2.1 WU Ao sat = 89% PA sat = 55%, 54% PAPi = 4.6    Objective:   Weight Range: 131.3 kg Body mass index is 37.16 kg/m.   Vital Signs:   Temp:  [97.7 F (36.5 C)-98.4 F (36.9 C)] 98.4 F (36.9 C) (11/12 0334) Pulse Rate:  [96-133] 101 (11/12 0700) Resp:  [11-22] 19 (11/12 0700) BP: (82-151)/(56-100) 123/86 (11/12 0700) SpO2:  [83 %-100 %] 96 % (11/12 0700) Weight:  [131.3 kg] 131.3 kg (11/12 0500) Last BM Date : 12/06/22  Weight change: Filed Weights   12/08/22 0615 12/10/22 0709 12/11/22 0500  Weight: (!) 139.3 kg 130.1 kg 131.3 kg    Intake/Output:   Intake/Output Summary (Last 24 hours) at 12/11/2022 0714 Last data filed at 12/11/2022 0700 Gross per 24 hour  Intake 3089.57 ml  Output 8890 ml  Net -5800.43 ml      Physical Exam    CVP 5-6  General:  Well appearing. No respiratory difficulty HEENT: normal Neck: supple. JVD not elevated. Carotids 2+ bilat; no bruits. No lymphadenopathy or thyromegaly appreciated. Cor: PMI nondisplaced. Irregularly irregular rhythm and rate. No rubs, gallops or murmurs. Lungs: decreased BS at the bases bilaterally  Abdomen: soft, nontender, nondistended. No hepatosplenomegaly. No bruits or masses. Good bowel sounds. Extremities: no cyanosis, clubbing, rash, trace  b/l LE edema + unna boots  Neuro: alert & oriented x 3, cranial nerves grossly intact. moves all 4 extremities w/o difficulty. Affect pleasant.  Telemetry   Atrial fibrillation 110s.    EKG    Afib w/ RVR, 111 bpm   Labs    CBC Recent Labs    12/10/22 1919 12/11/22 0406  WBC 6.9 7.9  HGB 9.1* 8.9*  HCT 26.6* 26.1*  MCV 95.0 94.9  PLT 179 201   Basic Metabolic Panel Recent Labs    66/44/03 1919 12/11/22 0406  NA 128* 128*  K 3.6 4.1  CL 93* 91*  CO2 28 29  GLUCOSE 172* 118*  BUN 7* 9  CREATININE 0.95 0.90  CALCIUM 8.7* 8.4*  MG 1.8 2.0   Liver Function Tests Recent Labs    12/09/22 1007  AST 50*  ALT 47*  ALKPHOS 56  BILITOT 1.2*  PROT 5.5*  ALBUMIN 2.5*   No results for input(s): "LIPASE", "AMYLASE" in the last 72 hours. Cardiac Enzymes No results for input(s): "CKTOTAL", "CKMB", "CKMBINDEX", "TROPONINI" in the last 72 hours.  BNP: BNP (last 3 results) Recent Labs    12/06/22 0551  BNP 402.8*    ProBNP (last 3 results) No results for input(s): "PROBNP" in the last 8760 hours.   D-Dimer No results for input(s): "DDIMER" in the last 72 hours.  Hemoglobin A1C No results for input(s): "HGBA1C" in the last 72 hours.  Fasting Lipid Panel No results for input(s): "CHOL", "HDL", "LDLCALC", "TRIG", "CHOLHDL", "LDLDIRECT" in the last 72 hours.  Thyroid Function Tests No results for input(s): "TSH", "T4TOTAL", "T3FREE", "THYROIDAB" in the last 72 hours.  Invalid input(s): "FREET3"   Other results:   Imaging    CARDIAC CATHETERIZATION  Result Date: 12/10/2022   Prox RCA to Mid RCA lesion is 100% stenosed.   Prox RCA lesion is 60% stenosed.   Ost LAD lesion is 70% stenosed.   Mid LM to Dist LM lesion is 70% stenosed.   Ost Cx lesion is 100% stenosed.   Mid LAD lesion is 95% stenosed.   Prox LAD to Mid LAD lesion is 50% stenosed.   2nd Diag lesion is 40% stenosed. Findings: Ao = 101/66 (79) LV = 99/12 RA = 8 RV = 45/10 PA = 53/15 (29) PCW = 12  Fick cardiac output/index = 8.1/3.2 PVR = 2.1 WU Ao sat = 89% PA sat = 55%, 54% PAPi = 4.6 Assessment: 1. Severe 3v heavily calcified CAD with CTO of Ostial LCx and midRCA. Mid LAD 95% 2. Unable to assess LVEF by V-gram 3. Mild to moderate PAH with normal PCWP and output Plan/Discussion: Needs CABG but with recent massive PE will need to discuss timing with TCTS. Will consult. Repeat echo Arvilla Meres, MD 5:54 PM  DG Chest Port 1 View  Result Date: 12/10/2022 CLINICAL DATA:  Pneumonia EXAM: PORTABLE CHEST 1 VIEW COMPARISON:  X-ray 12/08/2022.  Older exams as well. FINDINGS: Interval removal of the Swan-Ganz catheter. No right-sided pneumothorax. Stable left IJ catheter. Enlarged cardiopericardial silhouette. Bilateral pleural effusions and opacities, left-greater-than-right. No edema. Overlapping cardiac leads. Degenerative changes along the spine. IMPRESSION: Interval removal of the right IJ line.  No pneumothorax. Bilateral pleural effusions and opacities, left-greater-than-right. Recommend continued follow up Electronically Signed   By: Karen Kays M.D.   On: 12/10/2022 17:04     Medications:     Scheduled Medications:  aspirin  81 mg Oral Daily   Chlorhexidine Gluconate Cloth  6 each Topical Daily   digoxin  0.125 mg Oral Daily   famotidine  20 mg Oral BID   folic acid  1 mg Oral Daily   furosemide  80 mg Intravenous Q12H   insulin aspart  0-15 Units Subcutaneous Q4H   lidocaine  1 patch Transdermal Q24H   losartan  25 mg Oral Daily   multivitamin with minerals  1 tablet Oral Daily   polyethylene glycol  17 g Oral Daily   rosuvastatin  20 mg Oral Daily   sodium chloride flush  3 mL Intravenous Q12H   spironolactone  25 mg Oral Daily   thiamine  100 mg Oral Daily   Or   thiamine  100 mg Intravenous Daily    Infusions:  sodium chloride 20 mL/hr at 12/11/22 0700   sodium chloride Stopped (12/10/22 2243)   amiodarone 60 mg/hr (12/11/22 0700)   heparin 2,300 Units/hr (12/11/22  0700)    PRN Medications: sodium chloride, acetaminophen, guaiFENesin, ondansetron (ZOFRAN) IV, mouth rinse, oxyCODONE, sodium chloride flush, traZODone    Patient Profile   Mr Gayhart is a 62 year old male with a history of CAD, HTN,  and HLD.    Presented w/ CP and had PEA arrest in the ED. ROSC ~ 25 minutes. TNK given for presumed PE. AHF team consulted for cardiogenic shock.   Assessment/Plan   1. Acute  systolic CHF/Cardiogenic shock: Shock now resolved.  S/p PEA arrest 11/7.  Received tenecteplase in ER given concern for massive PE in setting of PEA arrest but could this all be CHF with new AF/RVR?  HS-TnI rose to 2815 post- arrest.  Bedside echo (somewhat difficult study) with LV EF difficult to quantify but looks in 35% range, RV moderately dilated/moderately dysfunctional with D-shaped septum, IVC dilated.  Extubated on 11/8 and now off all pressors, Co-ox 77%. Diuresed well w/ IV Lasix. 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 Diuresed well again yesterday. CVP 5 today  - Transition to PO torsemide 40 mg daily  - Continue digoxin 0.125 - Continue Spironolactone 25 mg daily  - Continue losartan 25 mg daily.  - Would wait to transition to [facility] and start SGLT2i until post CABG  - repeat echo   2. CAD: Known occlusion of OM1 in 2007.  HS-TnI 397 => 2815 post arrest, no STEMI on ECG.  Has received tenecteplase.  - LHC this admit severe 3v heavily calcified CAD with CTO of Ostial LCx and midRCA. Mid LAD 95%  - Needs CABG but with recent massive PE will need to discuss timing with TCTS. Will consult.  - Continue ASA/statin.  - on heparin gtt   3. 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  Has had tenecteplase.  Lower extremity venous dopplers with no DVT.  Will be hard to definitively confirm PE diagnosis at this point, but will get long-term anticoagulation due to  AF.   4. Atrial fibrillation: With RVR on admission, no prior history.  Converted to NSR on amiodarone initially but back in AF with RVR on 11/9 and in AF with RVR  - Continue amiodarone gtt 60 mg/hr.  - Cont heparin gtt - Continue digoxin as above.  - Will hold off on TEE/DCCV until decision is made regarding potential CABG   5. Acute hypoxemic respiratory failure: Extubated.  Stable on RA   6. ETOH abuse: Per brother, "1/2 gallon beer" daily.   7. Hyponatremia: Hypervolemic hyponatremia. Na improved w/ tolvaptan 123>>128 today  - continue diuretics per above  - Fluid restrict.    Length of Stay: [address], PA-C  12/11/2022, 7:14 AM  Advanced Heart Failure Team Pager [phone] (M-F; 7a - 5p)  Please contact [facility] for night-coverage after hours (5p -7a ) and weekends on [email or web address]  Patient seen and examined with the above-signed Advanced Practice Provider and/or Housestaff. I personally reviewed laboratory data, imaging studies and relevant notes. I independently examined the patient and formulated the important aspects of the plan. I have edited the note to reflect any of my changes or salient points. I have personally discussed the plan with the patient and/or family.  Cath yesterday with severe 3v CAD and stable hemodynamics.   Remains in AF with RVR on IV amio and heparin.   Denies CP or SOB. CVP 5. Co-ox 77%  General:  Sitting up in bed  No resp difficulty HEENT: normal Neck: supple. no JVD. Carotids 2+ bilat; no bruits. No lymphadenopathy or thryomegaly appreciated. Cor: Irreg tachy Lungs: clear Abdomen: soft, nontender, nondistended. No hepatosplenomegaly. No bruits or masses. Good bowel sounds. Extremities: no cyanosis, clubbing, rash, edema Neuro: alert & orientedx3, cranial nerves grossly intact. moves all 4 extremities w/o difficulty. Affect pleasant  Cath with severe 3v CAD. Have consulted TCTS  to discuss options. With recent life threatening  PE may want to wait a few weeks for CABG.   Will continue amio and IV heparin for now. Can proceed with TEE/DC-CV if not having surgery soon.   Repeat echo.   Arvilla Meres, MD  10:10 AM

## 2022-12-11 NOTE — Evaluation (Signed)
Speech Language Pathology Evaluation Patient Details Name: Jeremiah Dixon MRN: [id number] DOB: [birth year] Today's Date: 12/11/2022 Time: 1324-4010 SLP Time Calculation (min) (ACUTE ONLY): 29 min  Problem List:  Patient Active Problem List   Diagnosis Date Noted   Cardiogenic shock (HCC) 12/07/2022   Atrial fibrillation (HCC) 12/07/2022   Cardiac volume overload 12/07/2022   Cardiac arrest (HCC) 12/06/2022   Acute respiratory failure with hypoxia (HCC) 12/06/2022   Past Medical History:  Past Medical History:  Diagnosis Date   HLD (hyperlipidemia)    HTN (hypertension)    MI (myocardial infarction) [facility])    Past Surgical History:  Past Surgical History:  Procedure Laterality Date   RIGHT HEART CATH N/A 12/06/2022   Procedure: RIGHT HEART CATH;  Surgeon: Laurey Morale, MD;  Location: [facility] INVASIVE CV LAB;  Service: Cardiovascular;  Laterality: N/A;   RIGHT/LEFT HEART CATH AND CORONARY ANGIOGRAPHY N/A 12/10/2022   Procedure: RIGHT/LEFT HEART CATH AND CORONARY ANGIOGRAPHY;  Surgeon: Dolores Patty, MD;  Location: [facility] INVASIVE CV LAB;  Service: Cardiovascular;  Laterality: N/A;   HPI:  Jeremiah Dixon is a 62 year old male who presented to the emergency department by EMS on 12/06/22 with respiratory arrest. SpO2 in 60s for EMS. When he arrived to ED, unresponsive, pulseless and CPR was initiated. He was subsequently intubated. TNK was given during arrest out of concern for PE. EDP notes approximately 25 minutes of CPR total. No head imaging. Pt with past medical history of CAD, HTN, HLD, prediabetes.   Assessment / Plan / Recommendation Clinical Impression  Pt presents with mild cognitive linguistic deficits. Pt reports today's performance to be consistent with his baseline.  Pt was assessed using the COGNISTAT (see below for additional information).  Pt performed within the average range on all subtests administered except for word recall and caluclations. Pt did not appear to  understand word recall task at first. With reexplanation he named the remaining items with MC (2) and category (1) cues.  Pt demonstrated ability to break problems in to smaller steps, but had difficulty reintegrating these pieces for solution.  Visuospatial ability was assessed by clockdrawing.  The numbers were placed outside of the circle he drew for the clock face, but there were no other errors and pt placed items for clock in picture in an organized manner.  Pt believes that he is not experiencing any new changes. There is no head imaging to indicate concern for neurologic injury s/p CPR.  He reports of hx of learning disabilities as a child. He feels confident that after completing rehab for physical changes he will be able to resume his daily activities including part time work without any difficulties.  Pt has no acute care needs; SLP will sign off. If pt notices changes or it is indicated as part of rehab program, pt could be re-assessed at next level of care.  COGNISTAT: All subtests are within the average range, except where otherwise specified.  Orientation:  11/12 Attention: 7/8 Comprehension: 5/6 Repetition: 12/12 Naming: 8/8 Construction: clock drawing Memory: 4/12, severe impairment Calculations: 2/4, mild impairment Similarities: 7/8 Judgment: 6/6     SLP Assessment  SLP Recommendation/Assessment: Patient does not need any further Speech Lanaguage Pathology Services (Could be reassessed at next level of care if indicated) SLP Visit Diagnosis: Cognitive communication deficit (R41.841)    Recommendations for follow up therapy are one component of a multi-disciplinary discharge planning process, led by the attending physician.  Recommendations may be updated based on patient status, additional  functional criteria and insurance authorization.    Follow Up Recommendations  Follow physician's recommendations for discharge plan and follow up therapies    Assistance Recommended at  Discharge     Functional Status Assessment Patient has not had a recent decline in their functional status  Frequency and Duration  N/A         SLP Evaluation Cognition  Overall Cognitive Status: No family/caregiver present to determine baseline cognitive functioning (Pt feels he is at baseline) Arousal/Alertness: Awake/alert Orientation Level: Oriented X4 Year: 2024 Month: November Day of Week: Correct Attention: Focused;Sustained Focused Attention: Appears intact Sustained Attention: Appears intact Memory: Impaired Memory Impairment: Decreased short term memory Decreased Short Term Memory: Verbal basic Problem Solving: Appears intact Executive Function: Reasoning Reasoning: Appears intact       Comprehension  Auditory Comprehension Overall Auditory Comprehension: Appears within functional limits for tasks assessed Commands: Within Functional Limits Conversation: Complex Visual Recognition/Discrimination Discrimination: Not tested Reading Comprehension Reading Status: Not tested    Expression Expression Primary Mode of Expression: Verbal Verbal Expression Initiation: No impairment Level of Generative/Spontaneous Verbalization: Conversation Repetition: No impairment Naming: No impairment Pragmatics:  (?slightly flat affect) Written Expression Dominant Hand: Right Written Expression: Not tested   Oral / Motor  Motor Speech Overall Motor Speech: Appears within functional limits for tasks assessed Respiration: Within functional limits Resonance: Within functional limits Articulation: Within functional limitis Motor Planning: Witnin functional limits Motor Speech Errors: Not applicable            Kerrie Pleasure, MA, CCC-SLP Acute Rehabilitation Services Office: [phone] 12/11/2022, 11:04 AM

## 2022-12-11 NOTE — Progress Notes (Signed)
Inpatient Rehab Admissions Coordinator:   Consult received and chart reviewed.  Pending TEE/DCCV today and needs CTS consult to determine timing of CABG.  Will f/u with patient once treatment plan is clear.   Estill Dooms, PT, DPT Admissions Coordinator [phone] 12/11/22  9:23 AM

## 2022-12-11 NOTE — Progress Notes (Addendum)
NAME:  Jeremiah Dixon, MRN:  [id number], DOB:  [birth year], LOS: 5 ADMISSION DATE:  12/06/2022, CONSULTATION DATE:  12/06/2022 REFERRING MD:  Dr. Madilyn Hook, EDP, CHIEF COMPLAINT:  resp arrest    History of Present Illness:  62 year old male with past medical history of CAD, HTN, HLD, prediabetes who presented to the emergency department on 12/06/22 with respiratory arrest. Per EDP, initial complaint of shortness of breath. EMS reported being called out to home for chest pain and difficulty breathing. SpO2 in 60s for EMS. When he arrived to ED, unresponsive, pulseless and CPR was initiated. He was subsequently intubated. TNK was given during arrest out of concern for PE. EDP notes approximately 25 minutes of CPR total. Was started on sedation, epi gtt and PCCM consulted for admission.   Pertinent  Medical History  CAD, HTN, HLD  Significant Hospital Events: Including procedures, antibiotic start and stop dates in addition to other pertinent events   11/7: respiratory distress, hypoxemia subsequent pulselessness and ~28min CPR until ROSC. Admitted to ICU early AM. On AM rounds, awake and alert, however appears to be in cardiogenic shock. He is cold on multiple pressors. Echo with RV/LV dysfunction. Got aline, cvc. Swan placed and was fortunately assuring, he did not need cardiac mechanical support 11/8 decr vent support as able, weaning vasoactive meds  11/9 extubated. Getting diuresis added metolazone to lasix  11/10 off all inotropic and pressor support. Back into AF w/ RVR so amio increased. Started Dig 11/11 still in AF w/ RVR., Back from left and right heart cath  Findings: Prox ->mid RCA 100% stenosed, Prox RCA 60%, Ost LAD 70% Mid LM to distal LM 70% Ost Cx 100% stenosed Mid LAD 95% PRox LAD to mid LAD 50% 2nd diag 40% 11/12 awaiting CVTS eval. SLP doing cognitive eval  Interim History / Subjective:   No distress. Still a littlw SOB w/ exertion  Objective      RA 8, PA 45/10/  PCWP 12, FICK CO and CI: 8.1 and 3.2      Blood pressure 117/77, pulse (!) 113, temperature 98.5 F (36.9 C), resp. rate (!) 23, height 6\' 2"  (1.88 m), weight 131.3 kg, SpO2 95%. CVP:  [5 mmHg-6 mmHg] 6 mmHg      Intake/Output Summary (Last 24 hours) at 12/11/2022 0955 Last data filed at 12/11/2022 0700 Gross per 24 hour  Intake 3089.57 ml  Output 7840 ml  Net -4750.43 ml   Filed Weights   12/08/22 0615 12/10/22 0709 12/11/22 0500  Weight: (!) 139.3 kg 130.1 kg 131.3 kg    Examination: General sitting up in bed. No distress HENT NCAT left internal jugular unremarkable Pulm dec on left. A little less rhonchus still on room air  Card still in af w/ HR 1 teens Abd soft Ext still w/ sig LE edema pulses are strong Neuro awake alert oriented. No focal def. Affect off a little at times Gu cl yellow   Resolved Hospital Problem list   Acute hypoxic resp failure extubated 11/9 Cardiogenic shock  NSVT  Lactic acidosis  Assessment & Plan:   DOE, Aspiration PNA and L pleural effusion -in setting of ?PE, cardiac arrest, volume overload Plan Continuing diuresis  Bedside US today. If has fluid on left needs therapeutic and diagnostic thora  Completed 7d ctx  PEA arrest. Etiology not clear. Got TNK for possible   massive PE, but also has severe 3V  heavily calcified CAD  (by card cath 11/11) and now acute systolic heart failure (  EF 35%) HTN.  Co-ox at goal tolerating diuretics  Plan Cards changing lasix to daily po toresemide Cont IV heparin  Cont ARB, spironolactone Entresto and SGLT2i to start after CABG Holding off on TEE. Awaiting CVTS eval   Afib, - RVR Plan Amiodarone  Dig  Tele  Heparin   Hyponatremia -volume overload, HF, stable Plan Cont to push diuresis  Daily chems   Prediabetes  Plan Ssi goal 140-180  Ac/hs  Anemia H&H stable no evidence of bleeding Plan Trend cbc Trigger for transfusion < 7   EtOH use. Prolonged time to ROSC w/ cardiac arrest A  little aggravated about not eating but appropriate and oriented.  No obvious w/d. Do wonder about possible residual cognitive impairment  s/p cardiac arrest and prolonged resuscitation  Plan B vit and micronutrient support  SLP for cognitive eval still pending   Best Practice (right click and "Reselect all SmartList Selections" daily)   Diet/type: Regular consistency (see orders) DVT prophylaxis: systemic heparin GI prophylaxis: H2B Lines: Central line, Arterial Line, and yes and it is still needed Foley:  Yes, and it is still needed Code Status:  full code Last date of multidisciplinary goals of care discussion [attempts to call brother unanswered, will try again later]

## 2022-12-11 NOTE — Progress Notes (Signed)
Occupational Therapy Treatment Patient Details Name: Jeremiah Dixon MRN: [id number] DOB: [birth year] Today's Date: 12/11/2022   History of present illness Pt is a 62 y/o male presenting to the ED on 11/7 with respiratory arrest, hypoxia. In ED became unresponsive, pulseless and CPR 25 min. Intubated 11/7-11/8.  TNK given for PE concern. 11/11 heart cath planned. PMHx  CAD, HTN   OT comments  Pt progressing well toward established OT goals. Performing functional mobility to door and back and then able to perform standing grooming tasks with assist for balance throughout. Pt needing heavier assist with pivotal steps and stepping backward to chair. RN present for chair follow for safety. Pt following commands throughout this session. Due to family support and significant change in functional status, recommending intensive multidisciplinary rehabilitation >3 hours/day to optimize safety and independence in ADL.        If plan is discharge home, recommend the following:  A little help with walking and/or transfers;A little help with bathing/dressing/bathroom;Assistance with cooking/housework;Assist for transportation;Help with stairs or ramp for entrance;Supervision due to cognitive status   Equipment Recommendations  Other (comment)    Recommendations for Other Services      Precautions / Restrictions Precautions Precautions: Fall       Mobility Bed Mobility Overal bed mobility: Needs Assistance Bed Mobility: Supine to Sit     Supine to sit: HOB elevated, Contact guard          Transfers Overall transfer level: Needs assistance Equipment used: Rolling walker (2 wheels) Transfers: Sit to/from Stand Sit to Stand: Min assist           General transfer comment: cues for hand placement and safety with min assist to rise from surface     Balance Overall balance assessment: Needs assistance Sitting-balance support: No upper extremity supported, Feet supported Sitting  balance-Leahy Scale: Fair     Standing balance support: Bilateral upper extremity supported, During functional activity, Reliant on assistive device for balance Standing balance-Leahy Scale: Poor Standing balance comment: RW in standing                           ADL either performed or assessed with clinical judgement   ADL Overall ADL's : Needs assistance/impaired     Grooming: Minimal assistance;Standing;Oral care Grooming Details (indicate cue type and reason): min A for balance and pt needed full set-up reliant on at least 1 UE for support             Lower Body Dressing: Maximal assistance;Sit to/from stand   Toilet Transfer: Minimal assistance;Ambulation;BSC/3in1;Rolling walker (2 wheels)           Functional mobility during ADLs: Minimal assistance;Rolling walker (2 wheels)      Extremity/Trunk Assessment Upper Extremity Assessment Upper Extremity Assessment: Generalized weakness   Lower Extremity Assessment Lower Extremity Assessment: Defer to PT evaluation        Vision   Vision Assessment?: No apparent visual deficits   Perception     Praxis      Cognition Arousal: Alert Behavior During Therapy: Flat affect Overall Cognitive Status: No family/caregiver present to determine baseline cognitive functioning Area of Impairment: Safety/judgement, Memory, Awareness, Problem solving                     Memory: Decreased short-term memory Following Commands: Follows one step commands consistently Safety/Judgement: Decreased awareness of deficits Awareness: Emergent Problem Solving: Slow processing General Comments: Oriented and following basic  commands. Decreased awareness of safety and continues to need education regarding typical rehab course        Exercises      Shoulder Instructions       General Comments HR 100 on arrival and up to 135 with mobility    Pertinent Vitals/ Pain       Pain Assessment Pain Assessment:  Faces Faces Pain Scale: Hurts even more Pain Location: chest with cough Pain Descriptors / Indicators: Sore Pain Intervention(s): Limited activity within patient's tolerance, Monitored during session  Home Living                                          Prior Functioning/Environment              Frequency  Min 1X/week        Progress Toward Goals  OT Goals(current goals can now be found in the care plan section)  Progress towards OT goals: Progressing toward goals  Acute Rehab OT Goals Patient Stated Goal: get better OT Goal Formulation: With patient Time For Goal Achievement: 12/23/22 Potential to Achieve Goals: Good ADL Goals Pt Will Perform Grooming: with supervision;standing Pt Will Perform Lower Body Dressing: with supervision;sit to/from stand Pt Will Transfer to Toilet: with supervision;ambulating;regular height toilet Additional ADL Goal #1: Pt will identify and implement 2+ energy conservation strategies. Additional ADL Goal #2: Pt will follow 3 step commands during ADL with min cues.  Plan      Co-evaluation                 AM-PAC OT "6 Clicks" Daily Activity     Outcome Measure   Help from another person eating meals?: None Help from another person taking care of personal grooming?: A Little Help from another person toileting, which includes using toliet, bedpan, or urinal?: A Lot Help from another person bathing (including washing, rinsing, drying)?: A Lot Help from another person to put on and taking off regular upper body clothing?: A Little Help from another person to put on and taking off regular lower body clothing?: A Lot 6 Click Score: 16    End of Session Equipment Utilized During Treatment: Gait belt;Rolling walker (2 wheels)  OT Visit Diagnosis: Unsteadiness on feet (R26.81);Muscle weakness (generalized) (M62.81);Other symptoms and signs involving cognitive function;Other abnormalities of gait and mobility  (R26.89)   Activity Tolerance Patient tolerated treatment well   Patient Left in chair;with call bell/phone within reach;with chair alarm set;with nursing/sitter in room   Nurse Communication Mobility status        Time: 1329-1400 OT Time Calculation (min): 31 min  Charges: OT General Charges $OT Visit: 1 Visit OT Treatments $Self Care/Home Management : 8-22 mins $Therapeutic Activity: 8-22 mins  Myrla Halsted, OTD, OTR/L [facility] Acute Rehabilitation Office: [phone]   Myrla Halsted 12/11/2022, 5:17 PM

## 2022-12-12 ENCOUNTER — Inpatient Hospital Stay (HOSPITAL_COMMUNITY): Payer: Managed Care, Other (non HMO)

## 2022-12-12 DIAGNOSIS — J810 Acute pulmonary edema: Secondary | ICD-10-CM | POA: Diagnosis not present

## 2022-12-12 DIAGNOSIS — I469 Cardiac arrest, cause unspecified: Secondary | ICD-10-CM | POA: Diagnosis not present

## 2022-12-12 LAB — COOXEMETRY PANEL
Carboxyhemoglobin: 2.2 % — ABNORMAL HIGH (ref 0.5–1.5)
Methemoglobin: <0.7 % (ref 0.0–1.5)
O2 Saturation: 63.3 %
Total hemoglobin: 9.2 g/dL — ABNORMAL LOW (ref 12.0–16.0)

## 2022-12-12 LAB — CYTOLOGY - NON PAP

## 2022-12-12 LAB — GLUCOSE, CAPILLARY
Glucose-Capillary: 103 mg/dL — ABNORMAL HIGH (ref 70–99)
Glucose-Capillary: 114 mg/dL — ABNORMAL HIGH (ref 70–99)
Glucose-Capillary: 100 mg/dL — ABNORMAL HIGH (ref 70–99)
Glucose-Capillary: 119 mg/dL — ABNORMAL HIGH (ref 70–99)
Glucose-Capillary: 118 mg/dL — ABNORMAL HIGH (ref 70–99)

## 2022-12-12 LAB — BASIC METABOLIC PANEL
Anion gap: 10 (ref 5–15)
BUN: 12 mg/dL (ref 8–23)
CO2: 28 mmol/L (ref 22–32)
Calcium: 8.6 mg/dL — ABNORMAL LOW (ref 8.9–10.3)
Chloride: 88 mmol/L — ABNORMAL LOW (ref 98–111)
Creatinine, Ser: 0.92 mg/dL (ref 0.61–1.24)
GFR, Estimated: >60 mL/min (ref >60)
Glucose, Bld: 97 mg/dL (ref 70–99)
Potassium: 3.6 mmol/L (ref 3.5–5.1)
Sodium: 126 mmol/L — ABNORMAL LOW (ref 135–145)

## 2022-12-12 LAB — CBC
HCT: 26.8 % — ABNORMAL LOW (ref 39.0–52.0)
Hemoglobin: 9.1 g/dL — ABNORMAL LOW (ref 13.0–17.0)
MCH: 32.2 pg (ref 26.0–34.0)
MCHC: 34 g/dL (ref 30.0–36.0)
MCV: 94.7 fL (ref 80.0–100.0)
Platelets: 234 10*3/uL (ref 150–400)
RBC: 2.83 MIL/uL — ABNORMAL LOW (ref 4.22–5.81)
RDW: 13.2 % (ref 11.5–15.5)
WBC: 8 10*3/uL (ref 4.0–10.5)
nRBC: 0 % (ref 0.0–0.2)

## 2022-12-12 LAB — LIPOPROTEIN A (LPA): Lipoprotein (a): 80.7 nmol/L — ABNORMAL HIGH (ref <75.0)

## 2022-12-12 LAB — MAGNESIUM: Magnesium: 1.6 mg/dL — ABNORMAL LOW (ref 1.7–2.4)

## 2022-12-12 LAB — DIGOXIN LEVEL: Digoxin Level: 0.3 ng/mL — ABNORMAL LOW (ref 0.8–2.0)

## 2022-12-12 LAB — HEPARIN LEVEL (UNFRACTIONATED): Heparin Unfractionated: 0.43 [IU]/mL (ref 0.30–0.70)

## 2022-12-12 MED ORDER — METOPROLOL TARTRATE 25 MG PO TABS
37.5000 mg | ORAL_TABLET | Freq: Two times a day (BID) | ORAL | Status: DC
  Administered 2022-12-12 – 2022-12-15 (×5): 37.5 mg via ORAL
  Filled 2022-12-12 (×6): qty 1

## 2022-12-12 MED ORDER — POTASSIUM CHLORIDE CRYS ER 20 MEQ PO TBCR
40.0000 meq | EXTENDED_RELEASE_TABLET | Freq: Once | ORAL | Status: AC
  Administered 2022-12-12: 40 meq via ORAL
  Filled 2022-12-12: qty 2

## 2022-12-12 MED ORDER — HEPARIN (PORCINE) 25000 UT/250ML-% IV SOLN
2300.0000 [IU]/h | INTRAVENOUS | Status: DC
  Administered 2022-12-12 – 2022-12-13 (×3): 2300 [IU]/h via INTRAVENOUS
  Filled 2022-12-12 (×2): qty 250

## 2022-12-12 MED ORDER — SORBITOL 70 % SOLN
30.0000 mL | Freq: Once | Status: AC
  Administered 2022-12-12: 30 mL via ORAL
  Filled 2022-12-12: qty 30

## 2022-12-12 MED ORDER — BISACODYL 10 MG RE SUPP
10.0000 mg | Freq: Every day | RECTAL | Status: DC | PRN
  Administered 2022-12-12: 10 mg via RECTAL
  Filled 2022-12-12: qty 1

## 2022-12-12 MED ORDER — FUROSEMIDE 10 MG/ML IJ SOLN: 40.0000 mg | Freq: Two times a day (BID) | INTRAMUSCULAR | Status: DC

## 2022-12-12 MED ORDER — ACETAMINOPHEN 325 MG PO TABS
650.0000 mg | ORAL_TABLET | Freq: Four times a day (QID) | ORAL | Status: DC
  Administered 2022-12-12 – 2022-12-19 (×24): 650 mg via ORAL
  Filled 2022-12-12 (×25): qty 2

## 2022-12-12 MED ORDER — MAGNESIUM SULFATE 4 GM/100ML IV SOLN
4.0000 g | Freq: Once | INTRAVENOUS | Status: AC
  Administered 2022-12-12: 4 g via INTRAVENOUS
  Filled 2022-12-12: qty 100

## 2022-12-12 MED ORDER — POTASSIUM CHLORIDE CRYS ER 20 MEQ PO TBCR: 40.0000 meq | EXTENDED_RELEASE_TABLET | Freq: Once | ORAL | Status: DC

## 2022-12-12 MED ORDER — METOPROLOL TARTRATE 25 MG PO TABS
37.5000 mg | ORAL_TABLET | Freq: Two times a day (BID) | ORAL | Status: DC
  Filled 2022-12-12: qty 1

## 2022-12-12 MED ORDER — IOPAMIDOL (ISOVUE-370) INJECTION 76%
50.0000 mL | Freq: Once | INTRAVENOUS | Status: AC | PRN
  Administered 2022-12-12: 50 mL via INTRAVENOUS

## 2022-12-12 MED ORDER — DOCUSATE SODIUM 100 MG PO CAPS
100.0000 mg | ORAL_CAPSULE | Freq: Two times a day (BID) | ORAL | Status: DC
  Administered 2022-12-12 – 2022-12-15 (×5): 100 mg via ORAL
  Filled 2022-12-12 (×6): qty 1

## 2022-12-12 MED ORDER — COLCHICINE 0.6 MG PO TABS
0.6000 mg | ORAL_TABLET | Freq: Two times a day (BID) | ORAL | Status: DC
  Administered 2022-12-12 (×2): 0.6 mg via ORAL
  Filled 2022-12-12 (×2): qty 1

## 2022-12-12 MED ORDER — TORSEMIDE 20 MG PO TABS
40.0000 mg | ORAL_TABLET | Freq: Every day | ORAL | Status: DC
  Administered 2022-12-12 – 2022-12-19 (×8): 40 mg via ORAL
  Filled 2022-12-12 (×8): qty 2

## 2022-12-12 NOTE — Progress Notes (Signed)
PHARMACY - ANTICOAGULATION CONSULT NOTE  Pharmacy Consult for heparin Indication: atrial fibrillation, pulmonary embolism  Allergies  Allergen Reactions   Bee Venom Palpitations   Diclofenac Diarrhea   Amoxicillin Diarrhea and Nausea Only   Lisinopril Cough   Pantoprazole Nausea Only    Other Reaction(s): chest pain    Patient Measurements: Height: 6\' 2"  (188 cm) Weight: 130.3 kg (287 lb 4.2 oz) IBW/kg (Calculated) : 82.2 Heparin Dosing Weight: 110kg  Vital Signs: Temp: 97.6 F (36.4 C) (11/13 0757) Temp Source: Oral (11/13 0757) BP: 119/79 (11/13 0730) Pulse Rate: 100 (11/13 0730)  Labs: Recent Labs    12/10/22 1919 12/11/22 0406 12/11/22 1124 12/12/22 0420  HGB 9.1* 8.9*  --  9.1*  HCT 26.6* 26.1*  --  26.8*  PLT 179 201  --  234  HEPARINUNFRC  --  0.27* 0.39 0.43  CREATININE 0.95 0.90  --  0.92    Estimated Creatinine Clearance: 119.4 mL/min (by C-G formula based on SCr of 0.92 mg/dL).   Medical History: Past Medical History:  Diagnosis Date   HLD (hyperlipidemia)    HTN (hypertension)    MI (myocardial infarction) (HCC)      Assessment: 62yo male with had PEA arrest on arrival to ED, tenecteplase was given for suspected PE. ROSC was obtained. Chest Xray w/ pulmonary edema and EKG w/ Afib. No anticoagulation prior to admission. Pharmacy consulted for heparin for afib and possible PE.    Heparin was held x6 hours for a thoracentesis on 11/12. Restarted 11/12 at 2015 at previous therapeutic rate.   Heparin level 0.43 is therapeutic with heparin running at 2300 units/hr. Hgb (9.1) and platelet count (234) are stable. Per RN, no report of pauses, issues with the line, or signs of bleeding. No current plan to transition to oral anticoagulation in anticipation of potential CABG.  Goal of Therapy:  Heparin level 0.3-0.7 units/ml Monitor platelets by anticoagulation protocol: Yes   Plan:  Continue heparin infusion at 2300 units/hr Monitor daily heparin  levels, CBC, and signs/symptoms of bleeding F/u plan for potential CABG   Thank you for allowing pharmacy to participate in this patient's care  Ernestene Kiel, PharmD PGY1 Pharmacy Resident  Please check AMION for all [facility] Pharmacy phone numbers After 10:00 PM, call Main Pharmacy [phone] 12/12/2022 8:25 AM

## 2022-12-12 NOTE — Progress Notes (Signed)
NAME:  Jeremiah Dixon, MRN:  [id number], DOB:  [birth year], LOS: 6 ADMISSION DATE:  12/06/2022, CONSULTATION DATE:  12/06/2022 REFERRING MD: Madilyn Hook - EDP, CHIEF COMPLAINT:  Respiratory arrest    History of Present Illness:  62 year old male with past medical history of CAD, HTN, HLD, prediabetes who presented to the emergency department on 12/06/22 with respiratory arrest. Per EDP, initial complaint of shortness of breath. EMS reported being called out to home for chest pain and difficulty breathing. SpO2 in 60s for EMS. When he arrived to ED, unresponsive, pulseless and CPR was initiated. He was subsequently intubated. TNK was given during arrest out of concern for PE. EDP notes approximately 25 minutes of CPR total. Was started on sedation, epi gtt and PCCM consulted for admission.   Pertinent Medical History:  CAD, HTN, HLD  Significant Hospital Events: Including procedures, antibiotic start and stop dates in addition to other pertinent events   11/7 Respiratory distress, hypoxemia subsequent pulselessness and ~71min CPR until ROSC. Admitted to ICU early AM. On AM rounds, awake and alert, however appears to be in cardiogenic shock. He is cold on multiple pressors. Echo with RV/LV dysfunction. Got aline, cvc. Swan placed and was fortunately assuring, he did not need cardiac mechanical support 11/8 Decrease vent support as able, weaning vasoactive meds  11/9 Extubated. Getting diuresis added metolazone to lasix  11/10 Off all inotropic and pressor support. Back into AF w/ RVR so amio increased. Started Dig 11/11 Still in AF w/ RVR. L/RHC Findings: Prox ->mid RCA 100% stenosed, Prox RCA 60%, Ost LAD 70%, Mid LM to distal LM 70%, Ost Cx 100% stenosed, Mid LAD 95%, Prox LAD to mid LAD 50%, 2nd diag 40% 11/12 awaiting CVTS eval. SLP doing cognitive eval   Interim History / Subjective:  No significant events overnight Feeling overall better, back/chest/ribs are sore from CPR S/p thora with 1L  bloody drainage from L pleural space Pain control suboptimal, lido/APAP added Remains on amio/hep gtt, transition to PO as able Toprol changed to Lopressor for easier titration/monitoring Repeat CT Chest per AHF, unfortunately study was not protocolled properly for PE ruleout (needs CTA) Small residual L pleural effusion on CT  Objective:  Blood pressure 119/79, pulse 100, temperature 97.6 F (36.4 C), temperature source Oral, resp. rate 19, height 6\' 2"  (1.88 m), weight 130.3 kg, SpO2 97%. CVP:  [5 mmHg-6 mmHg] 6 mmHg      Intake/Output Summary (Last 24 hours) at 12/12/2022 0800 Last data filed at 12/12/2022 0700 Gross per 24 hour  Intake 1338.24 ml  Output 2200 ml  Net -861.76 ml   Filed Weights   12/10/22 0709 12/11/22 0500 12/12/22 0441  Weight: 130.1 kg 131.3 kg 130.3 kg   Physical Examination: General: Acutely ill-appearing middle-aged man in NAD. Pleasant and conversant. HEENT: [place]/AT, anicteric sclera, PERRL, moist mucous membranes. Neuro: Awake, oriented x 4. Responds to verbal stimuli. Following commands consistently. Moves all 4 extremities spontaneously. Strength 5/5 in all 4 extremities.  CV: Irregularly irregular rhythm, rate 60s, no m/g/r. PULM: Breathing even and unlabored on 2LNC. Lung fields diminished at bases L > R, splinting due to pain. GI: Soft, nontender, nondistended. Normoactive bowel sounds. Extremities: No significant LE edema noted. Skin: Warm/dry, no rashes.  Resolved Hospital Problem List:   Acute hypoxic resp failure extubated 11/9 Cardiogenic shock  NSVT  Lactic acidosis  Assessment & Plan:   DOE, Aspiration PNA and L pleural effusion Respiratory arrest deteriorating to PEA In setting of ?PE, cardiac arrest, volume  overload - S/p thoracentesis for L pleural effusion - F/u pleural fluid studies - Monitor I&Os - Assess diuresis needs daily - Further evaluation as below  PEA arrest HFrEF HTN Etiology not clear. Got TNK for possible  massive PE, but also has severe 3V heavily calcified CAD (by cardiac cath 11/11) and now acute systolic heart failure (EF 35%).  - Cards/AHF following, appreciate recs - Trend Co-ox - Transition heparin gtt to DOAC - Diuresis/torsemide per Cards - Continue ARB, spironolactone, Entresto, SGLT2i to begin post-CABG - TCTS evaluation for CABG - Will need repeat CTA Chest if concern for PE, as prior CT Chest not ordered/protocolled properly for ruleout  Afib with RVR - Heparin gtt to DOAC - Amiodarone, digoxin for rate control, amio gtt to PO - Optimize electrolyes (K > 4, Mg > 2) - Cardiac monitoring  Hyponatremia Volume overload, HF, stable. - Trend BMP - Replete electrolytes as indicated - Monitor I&Os  Prediabetes  - SSI - CBGs ACHS - Goal CBG 140-180  Anemia - Trend H&H - Monitor for signs of active bleeding - Transfuse for Hgb < 7.0 or hemodynamically significant bleeding  Prolonged time to ROSC w/ cardiac arrest History of EtOH use No obvious w/d. Do wonder about possible residual cognitive impairment  s/p cardiac arrest and prolonged resuscitation. - Monitor for signs/symptoms of withdrawal - Thiamine, folate - MV - SLP cognitive eval  Best Practice (right click and "Reselect all SmartList Selections" daily)   Diet/type: Regular consistency (see orders) DVT prophylaxis: systemic heparin GI prophylaxis: H2B Lines: Central line, Arterial Line, and yes and it is still needed Foley:  Yes, and it is still needed Code Status:  full code Last date of multidisciplinary goals of care discussion [attempts to call brother unanswered, will try again later]  Critical care time:   The patient is critically ill with multiple organ system failure and requires high complexity decision making for assessment and support, frequent evaluation and titration of therapies, advanced monitoring, review of radiographic studies and interpretation of complex data.   Critical Care Time  devoted to patient care services, exclusive of separately billable procedures, described in this note is 34 minutes.  Tim Lair, PA-C [facility] 12/12/22 12:34 PM  Please see [email or web address] for pager details.  From 7A-7P if no response, please call [phone] After hours, please call ELink [phone]

## 2022-12-12 NOTE — Progress Notes (Signed)
Nutrition Follow-up  DOCUMENTATION CODES:   Not applicable  INTERVENTION:  Continue 2 gram sodium diet as ordered Ensure Enlive po TID, each supplement provides 350 kcal and 20 grams of protein. Continue MVI, folivite and thiamine in setting of EtOH use  NUTRITION DIAGNOSIS:   Inadequate oral intake related to acute illness as evidenced by NPO status. - progressing, extubated and diet advanced  GOAL:   Patient will meet greater than or equal to 90% of their needs - addressing via meals and nutrition supplements  MONITOR:   Vent status, Skin, I & O's, Labs, Weight trends  REASON FOR ASSESSMENT:   Ventilator    ASSESSMENT:   62 yo male admitted post PEA arrest with ROSC after 25 minutes, cardiogenic shock, intubated. PMH includes CAD, HTN, HLD, +EtOH abuse  11/7 admitted; intubated 2/2 cardiac arrest 11/9 extubated 11/11 s/p R/LHC findings of severe 3V disease, mild-mod PAH, normal output 11/13 s/p thoracentesis- yield 1L blood fluid  TCTS evaluating for possible CABG.    Pt sitting up in bedside recliner eating lunch at time of visit. Pt ordered tomato soup and a side salad, also with 2 Ensures on table as well of which he was consuming first of the two at that time.   Pt denies difficulty chewing or swallowing. He reports tongue sensitivity where he likely bit his tongue during intubation or cardiac arrest. On visual exam, his tongue is noticeably improving and less blue and swollen.   Discussed importance of continuing to optimize nutritional status leading up to any possible cardiac procedures. Pt is agreeable to continue to consume Ensure and eat a variety of menu items including protein containing foods with meals.  No BM documented during admission, at least 6 days since last BM. Pt with scheduled miralax. Noted sorbitol given this morning.   Pt's weight noted to be -11.4 kg between 11/08-11/13  Medications: pepcid, folvite, SSI 0-15 units q4h, MVI, miralax  daily, thiamine, torsemide  Labs: sodium 126, Mg 1.6 (repletion ordered)  UOP: 2.2L x24 hours I/O's: -26.3L since admit  Diet Order:   Diet Order             Diet 2 gram sodium Room service appropriate? Yes; Fluid consistency: Thin  Diet effective now                   EDUCATION NEEDS:   Not appropriate for education at this time  Skin:  Skin Assessment: Reviewed RN Assessment  Last BM:  11/7  Height:   Ht Readings from Last 1 Encounters:  12/06/22 6\' 2"  (1.88 m)    Weight:   Wt Readings from Last 1 Encounters:  12/12/22 130.3 kg   BMI:  Body mass index is 36.88 kg/m.  Estimated Nutritional Needs:   Kcal:  2300-2500  Protein:  115-130g  Fluid:  1.8 L  [name], RDN, LDN Clinical Nutrition

## 2022-12-12 NOTE — Progress Notes (Addendum)
Patient ID: Jeremiah Dixon, male   DOB: 06/06/1960, 62 y.o.   MRN: [id number]     Advanced Heart Failure Rounding Note  PCP-Cardiologist: None   Subjective:    L sided pleural effusion s/p thoracentesis yesterday  Coox 63. CVP 7 Remains in rate controlled afib on Amio gtt.  Continues to have intermittent cough with associated pain. SOB improved. Ambulating in halls. Appetite great. No BM in a few days.  Objective:   Weight Range: 130.3 kg Body mass index is 36.88 kg/m.   Vital Signs:   Temp:  [97.6 F (36.4 C)-99.6 F (37.6 C)] 97.6 F (36.4 C) (11/13 0757) Pulse Rate:  [81-127] 100 (11/13 0730) Resp:  [11-26] 19 (11/13 0730) BP: (93-151)/(64-103) 119/79 (11/13 0730) SpO2:  [90 %-100 %] 97 % (11/13 0730) Weight:  [130.3 kg] 130.3 kg (11/13 0441) Last BM Date : 12/06/22  Weight change: Filed Weights   12/10/22 0709 12/11/22 0500 12/12/22 0441  Weight: 130.1 kg 131.3 kg 130.3 kg    Intake/Output:   Intake/Output Summary (Last 24 hours) at 12/12/2022 0819 Last data filed at 12/12/2022 0700 Gross per 24 hour  Intake 1338.24 ml  Output 2200 ml  Net -861.76 ml    Physical Exam    CVP 7 General: Well appearing. No distress on [place] HEENT: neck supple.  L internal jugular CVL Cardiac: JVP 9cm (difficult to assess). S1 and S2 present. No murmurs or rub. Resp: Lung sounds clear. Absent in LLL Abdomen: Soft, non-tender, non-distended. + BS. Extremities: Warm and dry. No rash, cyanosis. 1+ edema. Unna boots in place. Neuro: Alert and oriented x3. Affect pleasant. Moves all extremities without difficulty. Devices: Foley  Telemetry   Afib in 80s (personally reviewed)  EKG    N/a  Labs    CBC Recent Labs    12/11/22 0406 12/12/22 0420  WBC 7.9 8.0  HGB 8.9* 9.1*  HCT 26.1* 26.8*  MCV 94.9 94.7  PLT 201 234   Basic Metabolic Panel Recent Labs    57/84/69 0406 12/12/22 0420  NA 128* 126*  K 4.1 3.6  CL 91* 88*  CO2 29 28  GLUCOSE 118* 97   BUN 9 12  CREATININE 0.90 0.92  CALCIUM 8.4* 8.6*  MG 2.0 1.6*   Liver Function Tests Recent Labs    12/09/22 1007 12/11/22 1849  AST 50*  --   ALT 47*  --   ALKPHOS 56  --   BILITOT 1.2*  --   PROT 5.5* 3.7*  ALBUMIN 2.5*  --    No results for input(s): "LIPASE", "AMYLASE" in the last 72 hours. Cardiac Enzymes No results for input(s): "CKTOTAL", "CKMB", "CKMBINDEX", "TROPONINI" in the last 72 hours.  BNP: BNP (last 3 results) Recent Labs    12/06/22 0551  BNP 402.8*    ProBNP (last 3 results) No results for input(s): "PROBNP" in the last 8760 hours.   D-Dimer No results for input(s): "DDIMER" in the last 72 hours. Hemoglobin A1C No results for input(s): "HGBA1C" in the last 72 hours.  Fasting Lipid Panel No results for input(s): "CHOL", "HDL", "LDLCALC", "TRIG", "CHOLHDL", "LDLDIRECT" in the last 72 hours.  Thyroid Function Tests No results for input(s): "TSH", "T4TOTAL", "T3FREE", "THYROIDAB" in the last 72 hours.  Invalid input(s): "FREET3"  Other results:  Imaging   DG CHEST PORT 1 VIEW  Result Date: 12/11/2022 CLINICAL DATA:  142230 with pleural effusion post thoracentesis. Recent cardiac arrest. EXAM: PORTABLE CHEST 1 VIEW COMPARISON:  Portable chest yesterday  at 11:59 a.m. FINDINGS: 6:37 p.m. Small left pleural effusion remains post thoracentesis. There is no measurable pneumothorax. The lungs hypoinflated. There atelectatic bands in the lower zones. No focal consolidation is seen. Stable cardiomegaly. No vascular congestion findings. Stable mediastinum with aortic atherosclerosis. Left IJ central line again terminates in the upper SVC. No new osseous abnormality. Overlying monitor wires. IMPRESSION: 1. Small left pleural effusion remains post thoracentesis. No measurable pneumothorax. 2. Hypoinflated lungs with atelectatic bands in the lower zones. 3. Stable cardiomegaly. 4. Aortic atherosclerosis. Electronically Signed   By: Almira Bar M.D.   On:  12/11/2022 22:45   ECHOCARDIOGRAM LIMITED  Result Date: 12/11/2022    ECHOCARDIOGRAM LIMITED REPORT   Patient Name:   Jeremiah Dixon Date of Exam: 12/11/2022 Medical Rec #:  [id number]            Height:       74.0 in Accession #:    [id number]           Weight:       289.5 lb Date of Birth:  [birth year]            BSA:          2.543 m Patient Age:    62 years             BP:           118/86 mmHg Patient Gender: M                    HR:           112 bpm. Exam Location:  Inpatient Procedure: Limited Echo and Intracardiac Opacification Agent Indications:    I48.91* Unspecified atrial fibrillation  History:        Patient has prior history of Echocardiogram examinations, most                 recent 12/06/2022. Arrythmias:Atrial Fibrillation; Risk                 Factors:Hypertension and Dyslipidemia.  Sonographer:    Irving Burton Senior RDCS Referring Phys: [id number] Isaia Hassell R Vonzella Althaus  Sonographer Comments: Limited to assess LV function IMPRESSIONS  1. Left ventricular ejection fraction, by estimation, is 40 to 45%. The left ventricle has mildly decreased function. The left ventricle demonstrates global hypokinesis.  2. Tricuspid regurgitation signal is inadequate for assessing PA pressure.  3. The inferior vena cava is normal in size with greater than 50% respiratory variability, suggesting right atrial pressure of 3 mmHg.  4. Consider repeat 2D echo once heart rate better controlled. FINDINGS  Left Ventricle: Left ventricular ejection fraction, by estimation, is 40 to 45%. The left ventricle has mildly decreased function. The left ventricle demonstrates global hypokinesis. Definity contrast agent was given IV to delineate the left ventricular  endocardial borders. Right Ventricle: Tricuspid regurgitation signal is inadequate for assessing PA pressure. Venous: The inferior vena cava is normal in size with greater than 50% respiratory variability, suggesting right atrial pressure of 3 mmHg. Armanda Magic MD  Electronically signed by Armanda Magic MD Signature Date/Time: 12/11/2022/1:21:07 PM    Final     Medications:    Scheduled Medications:  aspirin  81 mg Oral Daily   Chlorhexidine Gluconate Cloth  6 each Topical Daily   chlorpheniramine-HYDROcodone  5 mL Oral Q12H   digoxin  0.125 mg Oral Daily   famotidine  20 mg Oral BID   feeding supplement  237 mL Oral TID BM  folic acid  1 mg Oral Daily   guaiFENesin  1,200 mg Oral BID   insulin aspart  0-15 Units Subcutaneous Q4H   lidocaine  1 patch Transdermal Q24H   losartan  25 mg Oral Daily   metoprolol succinate  25 mg Oral BID   multivitamin with minerals  1 tablet Oral Daily   polyethylene glycol  17 g Oral Daily   potassium chloride  40 mEq Oral Once   rosuvastatin  20 mg Oral Daily   sodium chloride flush  10 mL Intracatheter Q12H   spironolactone  25 mg Oral Daily   thiamine  100 mg Oral Daily   Or   thiamine  100 mg Intravenous Daily   torsemide  40 mg Oral Daily    Infusions:  amiodarone 60 mg/hr (12/12/22 0700)   heparin 2,300 Units/hr (12/12/22 0700)   magnesium sulfate bolus IVPB 50 mL/hr at 12/12/22 0700    PRN Medications: acetaminophen, ondansetron (ZOFRAN) IV, mouth rinse, oxyCODONE, traZODone  Patient Profile   Mr Piwowar is a 62 year old male with a history of CAD, HTN,  and HLD.    Presented w/ CP and had PEA arrest in the ED. ROSC ~ 25 minutes. TNK given for presumed PE. AHF team consulted for cardiogenic shock.   Assessment/Plan   1. Acute systolic CHF/Cardiogenic shock: Shock now resolved.  S/p PEA arrest 11/7.  Received tenecteplase in ER given concern for massive PE in setting of PEA arrest but could this all be CHF with new AF/RVR?  HS-TnI rose to 2815 post- arrest.  Bedside echo (somewhat difficult study) with LV EF difficult to quantify but looks in 35% range, RV moderately dilated/moderately dysfunctional with D-shaped septum, IVC dilated.  Extubated on 11/8 and now off all pressors. Diuresed well  w/ IV Lasix. 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 Repeat Echo yesterday with EF 40-45%, RV nl. Diuresis has dropped of after transitioned to oral. Coox 63%. CVP 7 today. - Continue Torsemide 40mg  PO. Unna boots in place. - Continue digoxin 0.125 - Continue Spironolactone 25 mg daily  - Continue losartan 25 mg daily.  - Would wait to transition to [facility] and start SGLT2i until post CABG   2. CAD: Known occlusion of OM1 in 2007.  HS-TnI 397 => 2815 post arrest, no STEMI on ECG.  Has received tenecteplase.  - LHC this admit severe 3v heavily calcified CAD with CTO of Ostial LCx and midRCA. Mid LAD 95%  - CTS consulted. Will need CABG v. LAD stent. Long term prognosis favors CABG. With recent massive PE will need to discuss timing. - Continue ASA/statin.  - Continue heparin gtt   3. 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  Has had tenecteplase.  Lower extremity venous dopplers with no DVT.  Will be hard to definitively confirm PE diagnosis at this point, but will get long-term anticoagulation due to AF.   4. Atrial fibrillation: With RVR on admission, no prior history.  Converted to NSR on amiodarone initially but back in AF with RVR on 11/9. Currently rate controlled afib.  - Continue amiodarone gtt 60 mg/hr - Continue heparin gtt - Continue digoxin as above.  - Will hold off on TEE/DCCV until decision is made regarding potential CABG  5. Acute hypoxemic respiratory failure: Extubated.  Stable on [place] for comfort - L pleural effusion noted  on CXR, drained by CCM and repeat CXR with residual effusion noted - Follow CXR  6. ETOH abuse: Per brother, "1/2 gallon beer" daily.   7. Hyponatremia: Hypervolemic hyponatremia. Na improved w/ tolvaptan 128>126 today  - Continue diuretics per above  - Fluid restrict.   Length of Stay: 6  [place] Lee, NP  12/12/2022, 8:19  AM  Advanced Heart Failure Team Pager [phone] (M-F; 7a - 5p)  Please contact [facility] for night-coverage after hours (5p -7a ) and weekends on [email or web address]  Agree with above.   Remains in AF with RVR on IV amio. On heparin. No bleeding  Updated echo EF 40-45% Co-ox 63%. Denies CP or SOB.   Seen by TCTS raised concern that initial decompensation was HF/AF and not PE (got empiric TNK in setting of severe extremis and severe RV failure on admit)   General: Sitting up in bed  No resp difficulty HEENT: normal Neck: supple. no JVD. Carotids 2+ bilat; no bruits. No lymphadenopathy or thryomegaly appreciated. Cor: Irregular tachy Lungs: clear Abdomen: soft, nontender, nondistended. No hepatosplenomegaly. No bruits or masses. Good bowel sounds. Extremities: no cyanosis, clubbing, rash, edema Neuro: alert & orientedx3, cranial nerves grossly intact. moves all 4 extremities w/o difficulty. Affect pleasant  I agree with Dr. Renato Battles, initial decompensation likely AF with RVR on top of HF. Initial echo reviewed and had severe RV dysfunction but LE u/s negative for DVT.   Will check chest CT. If no evidence of PE, would agree with proceeding with CABG/[place]. PAPi 4.6 on RHC.   Continue heparin.   CRITICAL CARE Performed by: Arvilla Meres  Total critical care time: 40 minutes  Critical care time was exclusive of separately billable procedures and treating other patients.  Critical care was necessary to treat or prevent imminent or life-threatening deterioration.  Critical care was time spent personally by me (independent of midlevel providers or residents) on the following activities: development of treatment plan with patient and/or surrogate as well as nursing, discussions with consultants, evaluation of patient's response to treatment, examination of patient, obtaining history from patient or surrogate, ordering and performing treatments and interventions, ordering and review of  laboratory studies, ordering and review of radiographic studies, pulse oximetry and re-evaluation of patient's condition.  Arvilla Meres, MD  9:45 AM

## 2022-12-12 NOTE — Plan of Care (Signed)
  Problem: Coping: Goal: Ability to adjust to condition or change in health will improve Outcome: Progressing   Problem: Health Behavior/Discharge Planning: Goal: Ability to identify and utilize available resources and services will improve Outcome: Progressing Goal: Ability to manage health-related needs will improve Outcome: Progressing   Problem: Clinical Measurements: Goal: Ability to maintain clinical measurements within normal limits will improve Outcome: Progressing Goal: Will remain free from infection Outcome: Progressing Goal: Diagnostic test results will improve Outcome: Progressing Goal: Respiratory complications will improve Outcome: Progressing Goal: Cardiovascular complication will be avoided Outcome: Progressing   Problem: Pain Management: Goal: General experience of comfort will improve Outcome: Progressing

## 2022-12-12 NOTE — Progress Notes (Signed)
Physical Therapy Treatment Patient Details Name: Jeremiah Dixon MRN: [id number] DOB: January 31, 1960 Today's Date: 12/12/2022   History of Present Illness Pt is a 62 y/o male presenting to the ED on 11/7 with respiratory arrest, hypoxia. In ED became unresponsive, pulseless and CPR 25 min. Intubated 11/7-11/8.  TNK given for PE concern. 11/11 heart cath planned. 11/12 - s/p thoracentesis for L sided pleaural effusion PMHx  CAD, HTN    PT Comments  Pt remains to have flat affect however demo's improved ability to tolerate mobility and increased ambulation tolerance. Pt continues to require RW for safe ambulation.Pt given a pillow to hold over chest when coughing to help with pain management during coughing. Pt to continue to benefit from inpatient rehab > 3hrs/day post d/c to return to indep function. Acute PT to cont to follow.    If plan is discharge home, recommend the following: Assistance with cooking/housework;Assist for transportation;A little help with bathing/dressing/bathroom;A little help with walking and/or transfers;Direct supervision/assist for medications management;Help with stairs or ramp for entrance   Can travel by private vehicle        Equipment Recommendations  Rolling walker (2 wheels)    Recommendations for Other Services       Precautions / Restrictions Precautions Precautions: Fall Restrictions Weight Bearing Restrictions: No RUE Weight Bearing: Non weight bearing     Mobility  Bed Mobility Overal bed mobility: Needs Assistance Bed Mobility: Supine to Sit     Supine to sit: HOB elevated, Contact guard Sit to supine: Contact guard assist   General bed mobility comments: CGA with use of rail to pivot to EOB, increased time    Transfers Overall transfer level: Needs assistance Equipment used: Rolling walker (2 wheels) Transfers: Sit to/from Stand Sit to Stand: Min assist           General transfer comment: cues for hand placement, bed height  elevated due to pt being 6'2"    Ambulation/Gait Ambulation/Gait assistance: Min assist Gait Distance (Feet): 150 Feet Assistive device: Rolling walker (2 wheels) Gait Pattern/deviations: Step-through pattern, Decreased stride length, Trunk flexed Gait velocity: dec Gait velocity interpretation: <1.31 ft/sec, indicative of household ambulator   General Gait Details: walker adjusted to optimal height, pt able to maintain upright trunk, pt with noted difficulty clearing L foot compared to R, pt states "it feels asymmetric", verbal cues to slow down   Stairs             Wheelchair Mobility     Tilt Bed    Modified Rankin (Stroke Patients Only)       Balance Overall balance assessment: Needs assistance Sitting-balance support: No upper extremity supported, Feet supported Sitting balance-Leahy Scale: Fair     Standing balance support: Bilateral upper extremity supported, During functional activity, Reliant on assistive device for balance Standing balance-Leahy Scale: Poor Standing balance comment: RW in standing                            Cognition Arousal: Alert Behavior During Therapy: Flat affect Overall Cognitive Status: No family/caregiver present to determine baseline cognitive functioning                               Problem Solving: Slow processing General Comments: delayed response time but able to follow commands, improved spirits after walking, flat affect        Exercises  General Comments General comments (skin integrity, edema, etc.): VSS      Pertinent Vitals/Pain Pain Assessment Pain Assessment: 0-10 Pain Score: 10-Worst pain ever (0 at rest, 10/10 when coughing) Pain Location: chest with cough Pain Descriptors / Indicators: Sore    Home Living Family/patient expects to be discharged to:: Private residence Living Arrangements: Alone (PRN assist availavble from brother) Available Help at Discharge:  Family;Friend(s);Available PRN/intermittently Type of Home: Apartment Home Access: Stairs to enter   Entrance Stairs-Number of Steps: flight   Home Layout: Other (Comment) (1 level on the second floor) Home Equipment: None      Prior Function            PT Goals (current goals can now be found in the care plan section) Acute Rehab PT Goals Patient Stated Goal: Independent, back to work. PT Goal Formulation: With patient Time For Goal Achievement: 12/21/22 Potential to Achieve Goals: Good Progress towards PT goals: Progressing toward goals    Frequency    Min 1X/week      PT Plan      Co-evaluation              AM-PAC PT "6 Clicks" Mobility   Outcome Measure  Help needed turning from your back to your side while in a flat bed without using bedrails?: A Little Help needed moving from lying on your back to sitting on the side of a flat bed without using bedrails?: A Little Help needed moving to and from a bed to a chair (including a wheelchair)?: A Little Help needed standing up from a chair using your arms (e.g., wheelchair or bedside chair)?: A Little Help needed to walk in hospital room?: A Lot Help needed climbing 3-5 steps with a railing? : Total 6 Click Score: 15    End of Session   Activity Tolerance: Patient tolerated treatment well Patient left: with call bell/phone within reach;with nursing/sitter in room;in chair Nurse Communication: Mobility status PT Visit Diagnosis: Other abnormalities of gait and mobility (R26.89);Muscle weakness (generalized) (M62.81)     Time: 9629-5284 PT Time Calculation (min) (ACUTE ONLY): 21 min  Charges:    $Gait Training: 8-22 mins PT General Charges $$ ACUTE PT VISIT: 1 Visit                     Jeremiah Dixon, PT, DPT Acute Rehabilitation Services Secure chat preferred Office #: [phone]    Jeremiah Dixon 12/12/2022, 1:20 PM

## 2022-12-12 NOTE — Progress Notes (Signed)
[facility] ADULT ICU REPLACEMENT PROTOCOL   The patient does apply for the [facility] Adult ICU Electrolyte Replacment Protocol based on the criteria listed below:   1.Exclusion criteria: TCTS, ECMO, Dialysis, and Myasthenia Gravis patients 2. Is GFR >/= 30 ml/min? Yes.    Patient's GFR today is >60 3. Is SCr </= 2? Yes.   Patient's SCr is 0.92 mg/dL 4. Did SCr increase >/= 0.5 in 24 hours? No. 5.Pt's weight >40kg  Yes.   6. Abnormal electrolyte(s): K 3.6, Mag 1.6  7. Electrolytes replaced per protocol 8.  Call MD STAT for K+ </= 2.5, Phos </= 1, or Mag </= 1 Physician:    Markus Daft A 12/12/2022 5:51 AM

## 2022-12-13 DIAGNOSIS — I25118 Atherosclerotic heart disease of native coronary artery with other forms of angina pectoris: Secondary | ICD-10-CM | POA: Diagnosis not present

## 2022-12-13 DIAGNOSIS — I5023 Acute on chronic systolic (congestive) heart failure: Secondary | ICD-10-CM

## 2022-12-13 DIAGNOSIS — I2510 Atherosclerotic heart disease of native coronary artery without angina pectoris: Secondary | ICD-10-CM | POA: Diagnosis not present

## 2022-12-13 DIAGNOSIS — I469 Cardiac arrest, cause unspecified: Secondary | ICD-10-CM | POA: Diagnosis not present

## 2022-12-13 LAB — HEPARIN LEVEL (UNFRACTIONATED): Heparin Unfractionated: 0.48 [IU]/mL (ref 0.30–0.70)

## 2022-12-13 LAB — CBC
HCT: 27.6 % — ABNORMAL LOW (ref 39.0–52.0)
Hemoglobin: 9.1 g/dL — ABNORMAL LOW (ref 13.0–17.0)
MCH: 31.3 pg (ref 26.0–34.0)
MCHC: 33 g/dL (ref 30.0–36.0)
MCV: 94.8 fL (ref 80.0–100.0)
Platelets: 284 10*3/uL (ref 150–400)
RBC: 2.91 MIL/uL — ABNORMAL LOW (ref 4.22–5.81)
RDW: 13.2 % (ref 11.5–15.5)
WBC: 7.9 10*3/uL (ref 4.0–10.5)
nRBC: 0 % (ref 0.0–0.2)

## 2022-12-13 LAB — GLUCOSE, CAPILLARY
Glucose-Capillary: 102 mg/dL — ABNORMAL HIGH (ref 70–99)
Glucose-Capillary: 110 mg/dL — ABNORMAL HIGH (ref 70–99)
Glucose-Capillary: 123 mg/dL — ABNORMAL HIGH (ref 70–99)
Glucose-Capillary: 128 mg/dL — ABNORMAL HIGH (ref 70–99)
Glucose-Capillary: 85 mg/dL (ref 70–99)
Glucose-Capillary: 99 mg/dL (ref 70–99)

## 2022-12-13 LAB — BASIC METABOLIC PANEL
Anion gap: 9 (ref 5–15)
Anion gap: 9 (ref 5–15)
BUN: 11 mg/dL (ref 8–23)
BUN: 16 mg/dL (ref 8–23)
CO2: 27 mmol/L (ref 22–32)
CO2: 27 mmol/L (ref 22–32)
Calcium: 8.5 mg/dL — ABNORMAL LOW (ref 8.9–10.3)
Calcium: 8.8 mg/dL — ABNORMAL LOW (ref 8.9–10.3)
Chloride: 90 mmol/L — ABNORMAL LOW (ref 98–111)
Chloride: 93 mmol/L — ABNORMAL LOW (ref 98–111)
Creatinine, Ser: 1.18 mg/dL (ref 0.61–1.24)
Creatinine, Ser: 1.14 mg/dL (ref 0.61–1.24)
GFR, Estimated: >60 mL/min (ref >60)
GFR, Estimated: >60 mL/min (ref >60)
Glucose, Bld: 96 mg/dL (ref 70–99)
Glucose, Bld: 152 mg/dL — ABNORMAL HIGH (ref 70–99)
Potassium: 3.6 mmol/L (ref 3.5–5.1)
Potassium: 4.5 mmol/L (ref 3.5–5.1)
Sodium: 126 mmol/L — ABNORMAL LOW (ref 135–145)
Sodium: 129 mmol/L — ABNORMAL LOW (ref 135–145)

## 2022-12-13 LAB — MAGNESIUM: Magnesium: 2 mg/dL (ref 1.7–2.4)

## 2022-12-13 LAB — CULTURE, BLOOD (ROUTINE X 2): Special Requests: ADEQUATE

## 2022-12-13 LAB — COOXEMETRY PANEL
Carboxyhemoglobin: 2.7 % — ABNORMAL HIGH (ref 0.5–1.5)
Methemoglobin: <0.7 % (ref 0.0–1.5)
O2 Saturation: 61.4 %
Total hemoglobin: 9.6 g/dL — ABNORMAL LOW (ref 12.0–16.0)

## 2022-12-13 MED ORDER — AMIODARONE HCL 200 MG PO TABS
200.0000 mg | ORAL_TABLET | Freq: Two times a day (BID) | ORAL | Status: DC
  Filled 2022-12-13: qty 1

## 2022-12-13 MED ORDER — POTASSIUM CHLORIDE 10 MEQ/50ML IV SOLN
10.0000 meq | INTRAVENOUS | Status: AC
Start: 2022-12-13 — End: 2022-12-13
  Administered 2022-12-13 (×4): 10 meq via INTRAVENOUS
  Filled 2022-12-13 (×4): qty 50

## 2022-12-13 MED ORDER — AMIODARONE HCL 200 MG PO TABS: 200.0000 mg | ORAL_TABLET | Freq: Every day | ORAL | Status: DC

## 2022-12-13 MED ORDER — APIXABAN 5 MG PO TABS
5.0000 mg | ORAL_TABLET | Freq: Two times a day (BID) | ORAL | Status: DC
  Administered 2022-12-13 – 2022-12-19 (×13): 5 mg via ORAL
  Filled 2022-12-13 (×13): qty 1

## 2022-12-13 NOTE — Progress Notes (Signed)
Orthopedic Tech Progress Note Patient Details:  Jeremiah Dixon 1960/03/09 [id number]  Ortho Devices Type of Ortho Device: Roland Rack boot Ortho Device/Splint Location: BLE Ortho Device/Splint Interventions: Application, Ordered   Post Interventions Patient Tolerated: Well  Halo Shevlin A Trease B[place]ner 12/13/2022, 11:26 AM

## 2022-12-13 NOTE — Progress Notes (Signed)
NAME:  Sanay Reznikov, MRN:  [id number], DOB:  07/05/1960, LOS: 7 ADMISSION DATE:  12/06/2022, CONSULTATION DATE:  12/06/2022 REFERRING MD: Madilyn Hook - EDP, CHIEF COMPLAINT:  Respiratory arrest    History of Present Illness:  62 year old male with past medical history of CAD, HTN, HLD, prediabetes who presented to the emergency department on 12/06/22 with respiratory arrest. Per EDP, initial complaint of shortness of breath. EMS reported being called out to home for chest pain and difficulty breathing. SpO2 in 60s for EMS. When he arrived to ED, unresponsive, pulseless and CPR was initiated. He was subsequently intubated. TNK was given during arrest out of concern for PE. EDP notes approximately 25 minutes of CPR total. Was started on sedation, epi gtt and PCCM consulted for admission.   Pertinent Medical History:  CAD, HTN, HLD  Significant Hospital Events: Including procedures, antibiotic start and stop dates in addition to other pertinent events   11/7 Respiratory distress, hypoxemia subsequent pulselessness and ~8min CPR until ROSC. Admitted to ICU early AM. On AM rounds, awake and alert, however appears to be in cardiogenic shock. He is cold on multiple pressors. Echo with RV/LV dysfunction. Got aline, cvc. Swan placed and was fortunately assuring, he did not need cardiac mechanical support 11/8 Decrease vent support as able, weaning vasoactive meds  11/9 Extubated. Getting diuresis added metolazone to lasix  11/10 Off all inotropic and pressor support. Back into AF w/ RVR so amio increased. Started Dig 11/11 Still in AF w/ RVR. L/RHC Findings: Prox ->mid RCA 100% stenosed, Prox RCA 60%, Ost LAD 70%, Mid LM to distal LM 70%, Ost Cx 100% stenosed, Mid LAD 95%, Prox LAD to mid LAD 50%, 2nd diag 40% 11/12 awaiting CVTS eval. SLP doing cognitive eval  11/14, AHF discussed with TCTS and plan is for CABG first week of December  Interim History / Subjective:  No significant events  overnight Feeling fairly good though back/chest/ribs are still sore from CPR, he is hesitant to cough because of this TCTS unable to perform surgery until first week of December  HR controlled on 30 amio  Objective:  Blood pressure 110/70, pulse 85, temperature 98.6 F (37 C), temperature source Axillary, resp. rate 10, height 6\' 2"  (1.88 m), weight 130.3 kg, SpO2 96%. CVP:  [12 mmHg-18 mmHg] 12 mmHg      Intake/Output Summary (Last 24 hours) at 12/13/2022 1011 Last data filed at 12/13/2022 0900 Gross per 24 hour  Intake 1050.39 ml  Output 4550 ml  Net -3499.61 ml   Filed Weights   12/10/22 0709 12/11/22 0500 12/12/22 0441  Weight: 130.1 kg 131.3 kg 130.3 kg   Physical Examination: General: Adult male, resting in bed, in NAD. Neuro: A&O x 3, no deficits. HEENT: [place]/AT. Sclerae anicteric. EOMI. Cardiovascular: IRIR, no M/R/G. Some chest/back/rib pain after CPR. Lungs: Respirations even and unlabored.  CTA bilaterally, No W/R/R.  Abdomen: BS x 4, soft, NT/ND.  Musculoskeletal: No gross deformities, no edema.  Skin: Intact, warm, no rashes.   Assessment & Plan:   Aspiration PNA and L pleural effusion Respiratory arrest deteriorating to PEA - concern for PE, received TNK. Of note, LE duplex 11/7 was negative for VTE. L pleural effusion - s/p thora 11/12 with 1L bloody fluid removed - Continue supportive care - Do not feel he needs CTA with negative dopplers and absence of persistent RV dysfunction. Also committed to lifelong [facility] regardless given the A.fib  PEA arrest - likely from severe multivessel CAD and CHF Acute sCHF -  echo 11/12 with EF 40-45% HTN Etiology not clear. Got TNK for possible massive PE, but also has severe 3V heavily calcified CAD (by cardiac cath 11/11) and now acute systolic heart failure (EF 35%). LE duplex was negative. - Cards/AHF following, appreciate recs - Needs CABG, plan is first week of December with Dr. Laneta Simmers - Transition heparin gtt to  Eliquis - Continue Torsemide, Dig, Spiro, Losartan per cards - Waiting on SGT2i until post CABG  Afib with RVR - Heparin gtt to Eliquis today - Dr. Gala Romney planning for DCCV tomorrow 11/15 - Continue Amiodarone infusion until after DCCV then convert to PO - Continue Dig - Optimize electrolyes (K > 4, Mg > 2) - Cardiac monitoring  Hyponatremia Volume overload, HF, stable. - Diuresis as above - Fluid restriction - Trend BMP - Replete electrolytes as indicated - Monitor I&Os  Prediabetes  - SSI - CBGs ACHS - Goal CBG 140-180  Anemia - Trend H&H - Monitor for signs of active bleeding - Transfuse for Hgb < 7.0 or hemodynamically significant bleeding  Prolonged time to ROSC w/ cardiac arrest History of EtOH use No obvious w/d. Do wonder about possible residual cognitive impairment  s/p cardiac arrest and prolonged resuscitation. - Monitor for signs/symptoms of withdrawal - Thiamine, folate - MV - SLP cognitive eval   Stable for transfer out of ICU. Will ask TRH to assume care in AM 11/15 with PCCM off. Please call back if we can be of further assistance.   Best Practice (right click and "Reselect all SmartList Selections" daily)   Diet/type: Regular consistency (see orders) DVT prophylaxis: systemic heparin GI prophylaxis: H2B Lines: Central line, Arterial Line, and yes and it is still needed Foley:  Yes, and it is still needed Code Status:  full code Last date of multidisciplinary goals of care discussion [attempts to call brother unanswered, will try again later]   Rutherford Guys, PA - [facility] For pager details, please see AMION or use Epic chat  After 1900, please call [facility] for cross coverage needs 12/13/2022, 11:16 AM

## 2022-12-13 NOTE — H&P (View-Only) (Signed)
 Patient ID: Jeremiah Dixon, male   DOB: July 01, 1960, 62 y.o.   MRN: [id number]     Advanced Heart Failure Rounding Note  PCP-Cardiologist: None   Subjective:    Coox 63. CVP 7 Remains in rate controlled afib on Amio gtt.  CABG as outpatient per CTS. Will need DCCV prior to discharge.   Resting in bed. Occasional CP related to cough.   Objective:   Weight Range: 130.3 kg Body mass index is 36.88 kg/m.   Vital Signs:   Temp:  [97.8 F (36.6 C)-98.9 F (37.2 C)] 98.6 F (37 C) (11/14 0829) Pulse Rate:  [74-100] 83 (11/14 0800) Resp:  [9-21] 12 (11/14 0800) BP: (83-123)/(52-89) 110/76 (11/14 0800) SpO2:  [86 %-98 %] 98 % (11/14 0800) Last BM Date : 12/12/22  Weight change: Filed Weights   12/10/22 0709 12/11/22 0500 12/12/22 0441  Weight: 130.1 kg 131.3 kg 130.3 kg    Intake/Output:   Intake/Output Summary (Last 24 hours) at 12/13/2022 0924 Last data filed at 12/13/2022 0700 Gross per 24 hour  Intake 1104.68 ml  Output 4250 ml  Net -3145.32 ml    Physical Exam    CVP 5-6 General: Well appearing. No distress on RA HEENT: neck supple.   Cardiac: JVP 7. S1 and S2 present. No murmurs or rub. Resp: Lung sounds clear and equal B/L Abdomen: Soft, non-tender, non-distended. + BS. +BM yesterday Extremities: Warm and dry. No rash, cyanosis.  Mild peripheral edema.  Neuro: Alert and oriented x3. Affect pleasant. Moves all extremities without difficulty. Lines/Devices:  LIJ CVL. Foley  Telemetry   Afib in 80s (personally reviewed)  EKG    N/a  Labs    CBC Recent Labs    12/12/22 0420 12/13/22 0423  WBC 8.0 7.9  HGB 9.1* 9.1*  HCT 26.8* 27.6*  MCV 94.7 94.8  PLT 234 284   Basic Metabolic Panel Recent Labs    44/03/47 0420 12/13/22 0423  NA 126* 126*  K 3.6 3.6  CL 88* 90*  CO2 28 27  GLUCOSE 97 96  BUN 12 11  CREATININE 0.92 1.18  CALCIUM 8.6* 8.5*  MG 1.6* 2.0   Liver Function Tests Recent Labs    12/11/22 1849  PROT 3.7*   No  results for input(s): "LIPASE", "AMYLASE" in the last 72 hours. Cardiac Enzymes No results for input(s): "CKTOTAL", "CKMB", "CKMBINDEX", "TROPONINI" in the last 72 hours.  BNP: BNP (last 3 results) Recent Labs    12/06/22 0551  BNP 402.8*    ProBNP (last 3 results) No results for input(s): "PROBNP" in the last 8760 hours.   D-Dimer No results for input(s): "DDIMER" in the last 72 hours. Hemoglobin A1C No results for input(s): "HGBA1C" in the last 72 hours.  Fasting Lipid Panel No results for input(s): "CHOL", "HDL", "LDLCALC", "TRIG", "CHOLHDL", "LDLDIRECT" in the last 72 hours.  Thyroid Function Tests No results for input(s): "TSH", "T4TOTAL", "T3FREE", "THYROIDAB" in the last 72 hours.  Invalid input(s): "FREET3"  Other results:  Imaging   CT CHEST W CONTRAST  Result Date: 12/12/2022 CLINICAL DATA:  Heart failure EXAM: CT CHEST WITH CONTRAST TECHNIQUE: Multidetector CT imaging of the chest was performed during intravenous contrast administration. RADIATION DOSE REDUCTION: This exam was performed according to the departmental dose-optimization program which includes automated exposure control, adjustment of the mA and/or kV according to patient size and/or use of iterative reconstruction technique. CONTRAST:  50mL ISOVUE-370 IOPAMIDOL (ISOVUE-370) INJECTION 76% COMPARISON:  None Available. FINDINGS: Cardiovascular: Cardiomegaly. Trace  pericardial effusion. Normal caliber thoracic aorta with mild calcified plaque. Severe left main and three-vessel coronary artery calcifications. Mediastinum/Nodes: Esophagus thyroid are unremarkable. No No enlarged lymph nodes seen in the chest. Lungs/Pleura: Central airways are patent. Left lower lobe ground-glass opacities. Small left pleural effusion. No pneumothorax. Upper Abdomen: Gallstones. Indeterminate right adrenal gland nodule measuring 11 mm on series 3, image 156. Musculoskeletal: Acute mildly displaced and comminuted fractures of the  anterior and lateral left 3rd-7th ribs with the 3rd, 5th and 6th ribs fractured both anteriorly and laterally. Soft tissue hematoma of the left chest which is most pronounced of the level of the third and fourth ribs. Displaced left 6th and 7th costochondral fractures with mild adjacent chest wall hematoma. Soft tissue stranding of the left chest wall. Nondisplaced fracture of the mid sternum. IMPRESSION: 1. Left chest wall injury with multiple fractures of the anterolateral left 3rd-7th ribs, displaced left 6th and 7th costochondral fractures, and chest wall hematoma. 2. Nondisplaced fracture of the mid sternum. 3. Small left pleural effusion. No pneumothorax. 4. Left lower lobe ground-glass opacities, likely due to aspiration or contusion. 5. Indeterminate right adrenal gland nodule measuring 11 mm. Recommend nonemergent adrenal protocol CT further evaluation. 6. Coronary artery calcifications and aortic Atherosclerosis (ICD10-I70.0). Electronically Signed   By: Allegra Lai M.D.   On: 12/12/2022 13:16    Medications:    Scheduled Medications:  acetaminophen  650 mg Oral Q6H   aspirin  81 mg Oral Daily   Chlorhexidine Gluconate Cloth  6 each Topical Daily   chlorpheniramine-HYDROcodone  5 mL Oral Q12H   colchicine  0.6 mg Oral BID   digoxin  0.125 mg Oral Daily   docusate sodium  100 mg Oral BID   famotidine  20 mg Oral BID   feeding supplement  237 mL Oral TID BM   folic acid  1 mg Oral Daily   guaiFENesin  1,200 mg Oral BID   insulin aspart  0-15 Units Subcutaneous Q4H   lidocaine  1 patch Transdermal Q24H   losartan  25 mg Oral Daily   metoprolol tartrate  37.5 mg Oral BID   multivitamin with minerals  1 tablet Oral Daily   polyethylene glycol  17 g Oral Daily   rosuvastatin  20 mg Oral Daily   sodium chloride flush  10 mL Intracatheter Q12H   spironolactone  25 mg Oral Daily   thiamine  100 mg Oral Daily   Or   thiamine  100 mg Intravenous Daily   torsemide  40 mg Oral Daily     Infusions:  amiodarone 30 mg/hr (12/13/22 0700)   heparin 2,300 Units/hr (12/13/22 0700)   potassium chloride 10 mEq (12/13/22 0753)    PRN Medications: bisacodyl, ondansetron (ZOFRAN) IV, mouth rinse, oxyCODONE, traZODone  Patient Profile   Jeremiah Dixon is a 62 year old male with a history of CAD, HTN,  and HLD.    Presented w/ CP and had PEA arrest in the ED. ROSC ~ 25 minutes. TNK given for presumed PE. AHF team consulted for cardiogenic shock.   Assessment/Plan   1. Acute systolic CHF/Cardiogenic shock: Shock now resolved.  S/p PEA arrest 11/7.  Received tenecteplase in ER given concern for massive PE in setting of PEA arrest but could this all be CHF with new AF/RVR?  HS-TnI rose to 2815 post- arrest.  Bedside echo (somewhat difficult study) with LV EF difficult to quantify but looks in 35% range, RV moderately dilated/moderately dysfunctional with D-shaped septum, IVC  dilated.  Extubated on 11/8 and now off all pressors. Diuresed well w/ IV Lasix. 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 Repeat Echo with EF 40-45%, RV nl. Co[place] 61%. CVP 5-6 today. - Continue Torsemide 40mg  PO. Unna boots in place. - Continue digoxin 0.125 - Continue Spironolactone 25 mg daily  - Continue losartan 25 mg daily.  - Would wait to transition to [facility] and start SGLT2i until post CABG   2. CAD: Known occlusion of OM1 in 2007.  HS-TnI 397 => 2815 post arrest, no STEMI on ECG.  Has received tenecteplase.  - LHC this admit severe 3v heavily calcified CAD with CTO of Ostial LCx and midRCA. Mid LAD 95%  - CTS consulted. Will proceed with CABG per Dr. Laneta Simmers first week of Dec.  - Continue ASA/statin.  - Continue heparin gtt   3. 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  Has had tenecteplase.  Lower extremity venous dopplers with no DVT.  Will be hard to definitively confirm PE diagnosis at  this point, but will get long-term anticoagulation due to AF.   4. Atrial fibrillation: With RVR on admission, no prior history.  Converted to NSR on amiodarone initially but back in AF with RVR on 11/9. Currently rate controlled afib.  - Continue amiodarone gtt 60 mg/hr - Continue heparin gtt - Continue digoxin as above - TEE/DCCV today or tomorrow with plan for CABG as outpatient  5. Acute hypoxemic respiratory failure: Extubated.  Stable on [place] for comfort - L pleural effusion noted on CXR, drained by CCM and repeat CXR with residual effusion noted - Follow CXR  6. ETOH abuse: Per brother, "1/2 gallon beer" daily.   7. Hyponatremia: Hypervolemic hyponatremia. Na improved w/ tolvaptan 128>126 today  - Continue diuretics per above  - Fluid restrict.   Length of Stay: 7  Jeremiah Lee, NP  12/13/2022, 9:24 AM  Advanced Heart Failure Team Pager [phone] (M-F; 7a - 5p)  Please contact [facility] for night-coverage after hours (5p -7a ) and weekends on [email or web address]  Patient seen and examined with the above-signed Advanced Practice Provider and/or Housestaff. I personally reviewed laboratory data, imaging studies and relevant notes. I independently examined the patient and formulated the important aspects of the plan. I have edited the note to reflect any of my changes or salient points. I have personally discussed the plan with the patient and/or family.  Remains in AF. Feels ok. Denies CP or SOB. + cough  Feels weak CVP 5  General:  Sitting up in bed  No resp difficulty HEENT: normal Neck: supple. no JVD. Carotids 2+ bilat; no bruits. No lymphadenopathy or thryomegaly appreciated. Cor: Iregular rate & rhythm. No rubs, gallops or murmurs. Lungs: clear Abdomen: soft, nontender, nondistended. No hepatosplenomegaly. No bruits or masses. Good bowel sounds. Extremities: no cyanosis, clubbing, rash, tr-1+ edema Neuro: alert & orientedx3, cranial nerves grossly intact. moves all 4  extremities w/o difficulty. Affect pleasant  Case d/w CCM and TCTS. Plan CABG 12/2. Will plan TEE/DC-CV tomorrow. Switch heparin to Eliquis. Agree with CIR referral. Can go out of ICU.   Arvilla Meres, MD  3:59 PM

## 2022-12-13 NOTE — Progress Notes (Signed)
[facility] ADULT ICU REPLACEMENT PROTOCOL   The patient does apply for the [facility] Adult ICU Electrolyte Replacment Protocol based on the criteria listed below:   1.Exclusion criteria: TCTS, ECMO, Dialysis, and Myasthenia Gravis patients 2. Is GFR >/= 30 ml/min? Yes.    Patient's GFR today is >60 3. Is SCr </= 2? Yes.   Patient's SCr is 1.18 mg/dL 4. Did SCr increase >/= 0.5 in 24 hours? No. 5.Pt's weight >40kg  Yes.   6. Abnormal electrolyte(s): potassium 3.6  7. Electrolytes replaced per protocol 8.  Call MD STAT for K+ </= 2.5, Phos </= 1, or Mag </= 1 Physician:  protocol  Melvern Banker 12/13/2022 6:03 AM

## 2022-12-13 NOTE — Plan of Care (Signed)

## 2022-12-13 NOTE — TOC Progression Note (Signed)
Transition of Care [facility]) - Progression Note    Patient Details  Name: Laythen Mckinny MRN: [id number] Date of Birth: 1960/09/23  Transition [facility] CM/SW Contact  Nicanor Bake Phone Number: [phone] 12/13/2022, 2:46 PM  Clinical Narrative:   HF CSW met with pt at bedside. Pt will need a taxi voucher at dc. CSW explained the process of scheduling a hospital follow up appointment with PCP. Pt agrees.   TOC will continue following.     Expected Discharge Plan: Home w Home Health Services Barriers to Discharge: Continued Medical Work up  Expected Discharge Plan and Services   Discharge Planning Services: CM Consult   Living arrangements for the past 2 months: Apartment                                       Social Determinants of Health (SDOH) Interventions SDOH Screenings   Food Insecurity: No Food Insecurity (12/06/2022)  Housing: Low Risk  (12/06/2022)  Transportation Needs: Unmet Transportation Needs (12/06/2022)  Utilities: Not At Risk (12/06/2022)  Alcohol Screen: Medium Risk (12/06/2022)  Financial Resource Strain: Low Risk  (12/06/2022)  Tobacco Use: Medium Risk (07/20/2022)    Readmission Risk Interventions     No data to display

## 2022-12-13 NOTE — Progress Notes (Signed)
Pt declined assist with ambulation d/t wanting rest before procedure.   Jeremiah Dixon 12/13/2022 10:39 AM

## 2022-12-13 NOTE — Progress Notes (Signed)
Inpatient Rehab Admissions Coordinator:   CIR following at this time.  Note plans for eventual CABG, but immediate DCCV/TEE today vs tomorrow.  Declined mobilization today.  Would like to see one more therapy session to judge progress then will f/u with pt on needs.   Estill Dooms, PT, DPT Admissions Coordinator [phone] 12/13/22  4:01 PM

## 2022-12-13 NOTE — Progress Notes (Addendum)
Patient ID: Jeremiah Dixon, male   DOB: July 01, 1960, 62 y.o.   MRN: [id number]     Advanced Heart Failure Rounding Note  PCP-Cardiologist: None   Subjective:    Coox 63. CVP 7 Remains in rate controlled afib on Amio gtt.  CABG as outpatient per CTS. Will need DCCV prior to discharge.   Resting in bed. Occasional CP related to cough.   Objective:   Weight Range: 130.3 kg Body mass index is 36.88 kg/m.   Vital Signs:   Temp:  [97.8 F (36.6 C)-98.9 F (37.2 C)] 98.6 F (37 C) (11/14 0829) Pulse Rate:  [74-100] 83 (11/14 0800) Resp:  [9-21] 12 (11/14 0800) BP: (83-123)/(52-89) 110/76 (11/14 0800) SpO2:  [86 %-98 %] 98 % (11/14 0800) Last BM Date : 12/12/22  Weight change: Filed Weights   12/10/22 0709 12/11/22 0500 12/12/22 0441  Weight: 130.1 kg 131.3 kg 130.3 kg    Intake/Output:   Intake/Output Summary (Last 24 hours) at 12/13/2022 0924 Last data filed at 12/13/2022 0700 Gross per 24 hour  Intake 1104.68 ml  Output 4250 ml  Net -3145.32 ml    Physical Exam    CVP 5-6 General: Well appearing. No distress on RA HEENT: neck supple.   Cardiac: JVP 7. S1 and S2 present. No murmurs or rub. Resp: Lung sounds clear and equal B/L Abdomen: Soft, non-tender, non-distended. + BS. +BM yesterday Extremities: Warm and dry. No rash, cyanosis.  Mild peripheral edema.  Neuro: Alert and oriented x3. Affect pleasant. Moves all extremities without difficulty. Lines/Devices:  LIJ CVL. Foley  Telemetry   Afib in 80s (personally reviewed)  EKG    N/a  Labs    CBC Recent Labs    12/12/22 0420 12/13/22 0423  WBC 8.0 7.9  HGB 9.1* 9.1*  HCT 26.8* 27.6*  MCV 94.7 94.8  PLT 234 284   Basic Metabolic Panel Recent Labs    44/03/47 0420 12/13/22 0423  NA 126* 126*  K 3.6 3.6  CL 88* 90*  CO2 28 27  GLUCOSE 97 96  BUN 12 11  CREATININE 0.92 1.18  CALCIUM 8.6* 8.5*  MG 1.6* 2.0   Liver Function Tests Recent Labs    12/11/22 1849  PROT 3.7*   No  results for input(s): "LIPASE", "AMYLASE" in the last 72 hours. Cardiac Enzymes No results for input(s): "CKTOTAL", "CKMB", "CKMBINDEX", "TROPONINI" in the last 72 hours.  BNP: BNP (last 3 results) Recent Labs    12/06/22 0551  BNP 402.8*    ProBNP (last 3 results) No results for input(s): "PROBNP" in the last 8760 hours.   D-Dimer No results for input(s): "DDIMER" in the last 72 hours. Hemoglobin A1C No results for input(s): "HGBA1C" in the last 72 hours.  Fasting Lipid Panel No results for input(s): "CHOL", "HDL", "LDLCALC", "TRIG", "CHOLHDL", "LDLDIRECT" in the last 72 hours.  Thyroid Function Tests No results for input(s): "TSH", "T4TOTAL", "T3FREE", "THYROIDAB" in the last 72 hours.  Invalid input(s): "FREET3"  Other results:  Imaging   CT CHEST W CONTRAST  Result Date: 12/12/2022 CLINICAL DATA:  Heart failure EXAM: CT CHEST WITH CONTRAST TECHNIQUE: Multidetector CT imaging of the chest was performed during intravenous contrast administration. RADIATION DOSE REDUCTION: This exam was performed according to the departmental dose-optimization program which includes automated exposure control, adjustment of the mA and/or kV according to patient size and/or use of iterative reconstruction technique. CONTRAST:  50mL ISOVUE-370 IOPAMIDOL (ISOVUE-370) INJECTION 76% COMPARISON:  None Available. FINDINGS: Cardiovascular: Cardiomegaly. Trace  pericardial effusion. Normal caliber thoracic aorta with mild calcified plaque. Severe left main and three-vessel coronary artery calcifications. Mediastinum/Nodes: Esophagus thyroid are unremarkable. No No enlarged lymph nodes seen in the chest. Lungs/Pleura: Central airways are patent. Left lower lobe ground-glass opacities. Small left pleural effusion. No pneumothorax. Upper Abdomen: Gallstones. Indeterminate right adrenal gland nodule measuring 11 mm on series 3, image 156. Musculoskeletal: Acute mildly displaced and comminuted fractures of the  anterior and lateral left 3rd-7th ribs with the 3rd, 5th and 6th ribs fractured both anteriorly and laterally. Soft tissue hematoma of the left chest which is most pronounced of the level of the third and fourth ribs. Displaced left 6th and 7th costochondral fractures with mild adjacent chest wall hematoma. Soft tissue stranding of the left chest wall. Nondisplaced fracture of the mid sternum. IMPRESSION: 1. Left chest wall injury with multiple fractures of the anterolateral left 3rd-7th ribs, displaced left 6th and 7th costochondral fractures, and chest wall hematoma. 2. Nondisplaced fracture of the mid sternum. 3. Small left pleural effusion. No pneumothorax. 4. Left lower lobe ground-glass opacities, likely due to aspiration or contusion. 5. Indeterminate right adrenal gland nodule measuring 11 mm. Recommend nonemergent adrenal protocol CT further evaluation. 6. Coronary artery calcifications and aortic Atherosclerosis (ICD10-I70.0). Electronically Signed   By: Allegra Lai M.D.   On: 12/12/2022 13:16    Medications:    Scheduled Medications:  acetaminophen  650 mg Oral Q6H   aspirin  81 mg Oral Daily   Chlorhexidine Gluconate Cloth  6 each Topical Daily   chlorpheniramine-HYDROcodone  5 mL Oral Q12H   colchicine  0.6 mg Oral BID   digoxin  0.125 mg Oral Daily   docusate sodium  100 mg Oral BID   famotidine  20 mg Oral BID   feeding supplement  237 mL Oral TID BM   folic acid  1 mg Oral Daily   guaiFENesin  1,200 mg Oral BID   insulin aspart  0-15 Units Subcutaneous Q4H   lidocaine  1 patch Transdermal Q24H   losartan  25 mg Oral Daily   metoprolol tartrate  37.5 mg Oral BID   multivitamin with minerals  1 tablet Oral Daily   polyethylene glycol  17 g Oral Daily   rosuvastatin  20 mg Oral Daily   sodium chloride flush  10 mL Intracatheter Q12H   spironolactone  25 mg Oral Daily   thiamine  100 mg Oral Daily   Or   thiamine  100 mg Intravenous Daily   torsemide  40 mg Oral Daily     Infusions:  amiodarone 30 mg/hr (12/13/22 0700)   heparin 2,300 Units/hr (12/13/22 0700)   potassium chloride 10 mEq (12/13/22 0753)    PRN Medications: bisacodyl, ondansetron (ZOFRAN) IV, mouth rinse, oxyCODONE, traZODone  Patient Profile   Jeremiah Dixon is a 62 year old male with a history of CAD, HTN,  and HLD.    Presented w/ CP and had PEA arrest in the ED. ROSC ~ 25 minutes. TNK given for presumed PE. AHF team consulted for cardiogenic shock.   Assessment/Plan   1. Acute systolic CHF/Cardiogenic shock: Shock now resolved.  S/p PEA arrest 11/7.  Received tenecteplase in ER given concern for massive PE in setting of PEA arrest but could this all be CHF with new AF/RVR?  HS-TnI rose to 2815 post- arrest.  Bedside echo (somewhat difficult study) with LV EF difficult to quantify but looks in 35% range, RV moderately dilated/moderately dysfunctional with D-shaped septum, IVC  dilated.  Extubated on 11/8 and now off all pressors. Diuresed well w/ IV Lasix. 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 Repeat Echo with EF 40-45%, RV nl. Coox 61%. CVP 5-6 today. - Continue Torsemide 40mg  PO. Unna boots in place. - Continue digoxin 0.125 - Continue Spironolactone 25 mg daily  - Continue losartan 25 mg daily.  - Would wait to transition to [facility] and start SGLT2i until post CABG   2. CAD: Known occlusion of OM1 in 2007.  HS-TnI 397 => 2815 post arrest, no STEMI on ECG.  Has received tenecteplase.  - LHC this admit severe 3v heavily calcified CAD with CTO of Ostial LCx and midRCA. Mid LAD 95%  - CTS consulted. Will proceed with CABG per Dr. Laneta Simmers first week of Dec.  - Continue ASA/statin.  - Continue heparin gtt   3. 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  Has had tenecteplase.  Lower extremity venous dopplers with no DVT.  Will be hard to definitively confirm PE diagnosis at  this point, but will get long-term anticoagulation due to AF.   4. Atrial fibrillation: With RVR on admission, no prior history.  Converted to NSR on amiodarone initially but back in AF with RVR on 11/9. Currently rate controlled afib.  - Continue amiodarone gtt 60 mg/hr - Continue heparin gtt - Continue digoxin as above - TEE/DCCV today or tomorrow with plan for CABG as outpatient  5. Acute hypoxemic respiratory failure: Extubated.  Stable on [place] for comfort - L pleural effusion noted on CXR, drained by CCM and repeat CXR with residual effusion noted - Follow CXR  6. ETOH abuse: Per brother, "1/2 gallon beer" daily.   7. Hyponatremia: Hypervolemic hyponatremia. Na improved w/ tolvaptan 128>126 today  - Continue diuretics per above  - Fluid restrict.   Length of Stay: 7  [place] Lee, NP  12/13/2022, 9:24 AM  Advanced Heart Failure Team Pager [phone] (M-F; 7a - 5p)  Please contact [facility] for night-coverage after hours (5p -7a ) and weekends on [email or web address]  Patient seen and examined with the above-signed Advanced Practice Provider and/or Housestaff. I personally reviewed laboratory data, imaging studies and relevant notes. I independently examined the patient and formulated the important aspects of the plan. I have edited the note to reflect any of my changes or salient points. I have personally discussed the plan with the patient and/or family.  Remains in AF. Feels ok. Denies CP or SOB. + cough  Feels weak CVP 5  General:  Sitting up in bed  No resp difficulty HEENT: normal Neck: supple. no JVD. Carotids 2+ bilat; no bruits. No lymphadenopathy or thryomegaly appreciated. Cor: Iregular rate & rhythm. No rubs, gallops or murmurs. Lungs: clear Abdomen: soft, nontender, nondistended. No hepatosplenomegaly. No bruits or masses. Good bowel sounds. Extremities: no cyanosis, clubbing, rash, tr-1+ edema Neuro: alert & orientedx3, cranial nerves grossly intact. moves all 4  extremities w/o difficulty. Affect pleasant  Case d/w CCM and TCTS. Plan CABG 12/2. Will plan TEE/DC-CV tomorrow. Switch heparin to Eliquis. Agree with CIR referral. Can go out of ICU.   Arvilla Meres, MD  3:59 PM

## 2022-12-13 NOTE — Progress Notes (Signed)
PHARMACY - ANTICOAGULATION CONSULT NOTE  Pharmacy Consult for heparin > apixaban Indication: atrial fibrillation, pulmonary embolism  Allergies  Allergen Reactions   Bee Venom Palpitations   Diclofenac Diarrhea   Amoxicillin Diarrhea and Nausea Only   Lisinopril Cough   Pantoprazole Nausea Only    Other Reaction(s): chest pain    Patient Measurements: Height: 6\' 2"  (188 cm) Weight: 130.3 kg (287 lb 4.2 oz) IBW/kg (Calculated) : 82.2 Heparin Dosing Weight: 110kg  Vital Signs: Temp: 98.6 F (37 C) (11/14 1139) Temp Source: Oral (11/14 1139) BP: 102/72 (11/14 1600) Pulse Rate: 87 (11/14 1600)  Labs: Recent Labs    12/11/22 0406 12/11/22 1124 12/12/22 0420 12/13/22 0423  HGB 8.9*  --  9.1* 9.1*  HCT 26.1*  --  26.8* 27.6*  PLT 201  --  234 284  HEPARINUNFRC 0.27* 0.39 0.43 0.48  CREATININE 0.90  --  0.92 1.18    Estimated Creatinine Clearance: 93.1 mL/min (by C-G formula based on SCr of 1.18 mg/dL).   Medical History: Past Medical History:  Diagnosis Date   HLD (hyperlipidemia)    HTN (hypertension)    MI (myocardial infarction) (HCC)      Assessment: 62yo male with had PEA arrest on arrival to ED, tenecteplase was given for suspected PE. ROSC was obtained. Chest Xray w/ pulmonary edema and EKG w/ Afib. No anticoagulation prior to admission. Pharmacy consulted for heparin for afib and possible PE.    Heparin was held x6 hours for a thoracentesis on 11/12. Restarted 11/12 at 2015 at previous therapeutic rate.   Heparin level 0.48 therapeutic with heparin running at 2300 units/hr. Hgb (9.1) and platelet count (234) are stable. Per RN, no report of pauses, issues with the line, or signs of bleeding. No current plan to transition to oral anticoagulation in anticipation of potential CABG - plan for early December Will transition to apixaban 5mg  BID today with plans for DCCV 11/15 Will need bridge on heparin prior to OR s/p PE and DCCV  Goal of Therapy:  Heparin  level 0.3-0.7 units/ml Monitor platelets by anticoagulation protocol: Yes   Plan:   heparin infusion at 2300 units/hr - stop Apixaban 5mg  BID     Leota Sauers Pharm.D. CPP, BCPS Clinical Pharmacist [phone] 12/13/2022 4:15 PM   Please check AMION for all [facility]acy phone numbers After 10:00 PM, call Main Pharmacy [phone] 12/13/2022 4:12 PM

## 2022-12-13 NOTE — Progress Notes (Signed)
3 Days Post-Op Procedure(s) (LRB): RIGHT/LEFT HEART CATH AND CORONARY ANGIOGRAPHY (N/A) Subjective: No chest pain or SOB. Ambulating some.  TEE DCCV postponed until tomorrow.  Objective: Vital signs in last 24 hours: Temp:  [97.8 F (36.6 C)-98.7 F (37.1 C)] 98.6 F (37 C) (11/14 1139) Pulse Rate:  [74-100] 100 (11/14 1700) Cardiac Rhythm: Atrial fibrillation (11/14 0800) Resp:  [9-22] 17 (11/14 1700) BP: (83-123)/(58-89) 112/72 (11/14 1700) SpO2:  [87 %-100 %] 99 % (11/14 1700)  Hemodynamic parameters for last 24 hours: CVP:  [2 mmHg-18 mmHg] 2 mmHg  Intake/Output from previous day: 11/13 0701 - 11/14 0700 In: 1285.6 [P.O.:250; I.V.:946.2; IV Piggyback:89.4] Out: 5100 [Urine:5100] Intake/Output this shift: Total I/O In: 464 [P.O.:240; I.V.:119.2; IV Piggyback:104.9] Out: 3005 [Urine:3005]    Lab Results: Recent Labs    12/12/22 0420 12/13/22 0423  WBC 8.0 7.9  HGB 9.1* 9.1*  HCT 26.8* 27.6*  PLT 234 284   BMET:  Recent Labs    12/12/22 0420 12/13/22 0423  NA 126* 126*  K 3.6 3.6  CL 88* 90*  CO2 28 27  GLUCOSE 97 96  BUN 12 11  CREATININE 0.92 1.18  CALCIUM 8.6* 8.5*    PT/INR: No results for input(s): "LABPROT", "INR" in the last 72 hours. ABG    Component Value Date/Time   PHART 7.439 12/10/2022 1727   HCO3 23.5 12/10/2022 1727   TCO2 25 12/10/2022 1727   ACIDBASEDEF 4.0 (H) 12/06/2022 1608   O2SAT 61.4 12/13/2022 0423   CBG (last 3)  Recent Labs    12/13/22 0827 12/13/22 1137 12/13/22 1700  GLUCAP 99 110* 128*    Assessment/Plan:  Severe multivessel CAD. Planning CABG on 12/2. TEE/DCCV tomorrow and then will have to decide what to do with pt prior to surgery. He says he doesn't think he can go home since he lives alone on 3rd floor of old apt bldg and doesn't think he can get up and down the stairs due to deconditioning. ? CIR or SNF preop to get stronger for surgery.  LOS: 7 days    Jeremiah Dixon 12/13/2022

## 2022-12-14 ENCOUNTER — Encounter (HOSPITAL_COMMUNITY): Payer: Self-pay | Admitting: Internal Medicine

## 2022-12-14 ENCOUNTER — Other Ambulatory Visit (HOSPITAL_COMMUNITY): Payer: Self-pay

## 2022-12-14 ENCOUNTER — Encounter (HOSPITAL_COMMUNITY)
Admission: EM | Disposition: A | Discharge status: Rehabilitation Facility | Payer: Self-pay | Source: Home / Self Care | Attending: Pulmonary Disease

## 2022-12-14 ENCOUNTER — Inpatient Hospital Stay (HOSPITAL_COMMUNITY): Payer: Managed Care, Other (non HMO) | Admitting: Anesthesiology

## 2022-12-14 ENCOUNTER — Inpatient Hospital Stay (HOSPITAL_COMMUNITY): Payer: Managed Care, Other (non HMO)

## 2022-12-14 DIAGNOSIS — E66812 Obesity, class 2: Secondary | ICD-10-CM

## 2022-12-14 DIAGNOSIS — Z01810 Encounter for preprocedural cardiovascular examination: Secondary | ICD-10-CM

## 2022-12-14 DIAGNOSIS — D649 Anemia, unspecified: Secondary | ICD-10-CM

## 2022-12-14 DIAGNOSIS — I5021 Acute systolic (congestive) heart failure: Secondary | ICD-10-CM

## 2022-12-14 DIAGNOSIS — I4891 Unspecified atrial fibrillation: Secondary | ICD-10-CM

## 2022-12-14 DIAGNOSIS — E871 Hypo-osmolality and hyponatremia: Secondary | ICD-10-CM

## 2022-12-14 DIAGNOSIS — I10 Essential (primary) hypertension: Secondary | ICD-10-CM

## 2022-12-14 DIAGNOSIS — I340 Nonrheumatic mitral (valve) insufficiency: Secondary | ICD-10-CM

## 2022-12-14 DIAGNOSIS — R7303 Prediabetes: Secondary | ICD-10-CM

## 2022-12-14 DIAGNOSIS — I2510 Atherosclerotic heart disease of native coronary artery without angina pectoris: Secondary | ICD-10-CM

## 2022-12-14 DIAGNOSIS — I2583 Coronary atherosclerosis due to lipid rich plaque: Secondary | ICD-10-CM

## 2022-12-14 HISTORY — PX: TRANSESOPHAGEAL ECHOCARDIOGRAM (CATH LAB): EP1270

## 2022-12-14 HISTORY — PX: CARDIOVERSION: EP1203

## 2022-12-14 LAB — CBC
HCT: 27.8 % — ABNORMAL LOW (ref 39.0–52.0)
Hemoglobin: 9.2 g/dL — ABNORMAL LOW (ref 13.0–17.0)
MCH: 31.4 pg (ref 26.0–34.0)
MCHC: 33.1 g/dL (ref 30.0–36.0)
MCV: 94.9 fL (ref 80.0–100.0)
Platelets: 297 10*3/uL (ref 150–400)
RBC: 2.93 MIL/uL — ABNORMAL LOW (ref 4.22–5.81)
RDW: 13.2 % (ref 11.5–15.5)
WBC: 6.3 10*3/uL (ref 4.0–10.5)
nRBC: 0 % (ref 0.0–0.2)

## 2022-12-14 LAB — BASIC METABOLIC PANEL
Anion gap: 10 (ref 5–15)
BUN: 15 mg/dL (ref 8–23)
CO2: 26 mmol/L (ref 22–32)
Calcium: 8.8 mg/dL — ABNORMAL LOW (ref 8.9–10.3)
Chloride: 95 mmol/L — ABNORMAL LOW (ref 98–111)
Creatinine, Ser: 0.92 mg/dL (ref 0.61–1.24)
GFR, Estimated: >60 mL/min (ref >60)
Glucose, Bld: 105 mg/dL — ABNORMAL HIGH (ref 70–99)
Potassium: 4.1 mmol/L (ref 3.5–5.1)
Sodium: 131 mmol/L — ABNORMAL LOW (ref 135–145)

## 2022-12-14 LAB — GLUCOSE, CAPILLARY
Glucose-Capillary: 108 mg/dL — ABNORMAL HIGH (ref 70–99)
Glucose-Capillary: 107 mg/dL — ABNORMAL HIGH (ref 70–99)
Glucose-Capillary: 130 mg/dL — ABNORMAL HIGH (ref 70–99)
Glucose-Capillary: 129 mg/dL — ABNORMAL HIGH (ref 70–99)
Glucose-Capillary: 106 mg/dL — ABNORMAL HIGH (ref 70–99)

## 2022-12-14 LAB — MAGNESIUM: Magnesium: 2 mg/dL (ref 1.7–2.4)

## 2022-12-14 LAB — COOXEMETRY PANEL
Carboxyhemoglobin: 2.2 % — ABNORMAL HIGH (ref 0.5–1.5)
Methemoglobin: <0.7 % (ref 0.0–1.5)
O2 Saturation: 67 %
Total hemoglobin: 9.9 g/dL — ABNORMAL LOW (ref 12.0–16.0)

## 2022-12-14 LAB — VAS US DOPPLER PRE CABG
Left ABI: 0.96
Right ABI: 0.94

## 2022-12-14 LAB — CHOLESTEROL, BODY FLUID: Cholesterol, Fluid: 63 mg/dL

## 2022-12-14 SURGERY — TRANSESOPHAGEAL ECHOCARDIOGRAM (TEE) (CATHLAB)
Anesthesia: Monitor Anesthesia Care

## 2022-12-14 MED ORDER — PROPOFOL 10 MG/ML IV BOLUS
INTRAVENOUS | Status: DC | PRN
  Administered 2022-12-14: 150 ug/kg/min via INTRAVENOUS
  Administered 2022-12-14: 80 ug via INTRAVENOUS
  Administered 2022-12-14: 20 ug via INTRAVENOUS

## 2022-12-14 MED ORDER — SODIUM CHLORIDE 0.9% FLUSH
10.0000 mL | Freq: Two times a day (BID) | INTRAVENOUS | Status: DC
  Administered 2022-12-14: 10 mL via INTRAVENOUS

## 2022-12-14 MED ORDER — SODIUM CHLORIDE 0.9 % IV SOLN: INTRAVENOUS | Status: DC | PRN

## 2022-12-14 MED ORDER — PHENYLEPHRINE HCL-NACL 20-0.9 MG/250ML-% IV SOLN
INTRAVENOUS | Status: DC | PRN
  Administered 2022-12-14: 160 ug via INTRAVENOUS
  Administered 2022-12-14: 45 ug/min via INTRAVENOUS
  Administered 2022-12-14: 160 ug via INTRAVENOUS

## 2022-12-14 SURGICAL SUPPLY — 1 items: PAD DEFIB RADIO PHYSIO CONN (PAD) ×2 IMPLANT

## 2022-12-14 NOTE — TOC Benefit Eligibility Note (Signed)
Patient Product/process development scientist completed.    The patient is insured through [facility]. Patient has [employer], may use a copay card, and/or apply for patient assistance if available.    Ran test claim for Eliquis 5 mg and the current 30 day co-pay is $0.00.   This test claim was processed through [facility]- copay amounts may vary at other pharmacies due to pharmacy/plan contracts, or as the patient moves through the different stages of their insurance plan.     Roland Earl, CPHT Pharmacy Technician III Certified Patient Advocate [facility] Patient Advocate Team Direct Number: [phone]  Fax: [phone]

## 2022-12-14 NOTE — Assessment & Plan Note (Signed)
Cell count has been stable.  Hgb today is 9.2  Iron panel with serum iron 109, TIBC 249, transferrin saturation 44 and ferritin 690, consistent with anemia of chronic disease with no iron deficiency.

## 2022-12-14 NOTE — Interval H&P Note (Signed)
History and Physical Interval Note:  12/14/2022 12:05 PM  Jeremiah Dixon  has presented today for surgery, with the diagnosis of afib.  The various methods of treatment have been discussed with the patient and family. After consideration of risks, benefits and other options for treatment, the patient has consented to  Procedure(s): TRANSESOPHAGEAL ECHOCARDIOGRAM (N/A) CARDIOVERSION (CATH LAB) (N/A) as a surgical intervention.  The patient's history has been reviewed, patient examined, no change in status, stable for surgery.  I have reviewed the patient's chart and labs.  Questions were answered to the patient's satisfaction.     Darwin Rothlisberger

## 2022-12-14 NOTE — Assessment & Plan Note (Signed)
11/11 coronary angiography with severe 3 vessel heavily calcified coronary artery disease with CTO of Ostial LCx and mid RCA. Mid LAD 95%.   Plan for coronary artery bypass grafting when patient more stable.  Continue aspirin and rosuvastatin.

## 2022-12-14 NOTE — Assessment & Plan Note (Signed)
Continue blood pressure control with metoprolol and losartan.  Diuresis with torsemide and spironolactone.

## 2022-12-14 NOTE — Transfer of Care (Signed)
Immediate Anesthesia Transfer of Care Note  Patient: Jeremiah Dixon  Procedure(s) Performed: TRANSESOPHAGEAL ECHOCARDIOGRAM CARDIOVERSION (CATH LAB)  Patient Location: PACU and Cath Lab  Anesthesia Type:MAC  Level of Consciousness: awake and sedated  Airway & Oxygen Therapy: Patient Spontanous Breathing and Patient connected to nasal cannula oxygen  Post-op Assessment: Report given to RN and Post -op Vital signs reviewed and stable  Post vital signs: Reviewed and stable  Last Vitals:  Vitals Value Taken Time  BP    Temp    Pulse    Resp    SpO2      Last Pain:  Vitals:   12/14/22 0800  TempSrc:   PainSc: 0-No pain      Patients Stated Pain Goal: 0 (12/12/22 1600)  Complications: No notable events documented.

## 2022-12-14 NOTE — Progress Notes (Signed)
Progress Note   Patient: Jeremiah Dixon ZOX:[id number] DOB: Apr 08, 1960 DOA: 12/06/2022     8 DOS: the patient was seen and examined on 12/14/2022   Brief hospital course: [name] was admitted to the hospital with the working diagnosis of cardiac arrest, due to heart failure exacerbation.   62 yo male with the past medical history of coronary artery disease, hypertension, dyslipidemia and obesity who presented after having a cardiac arrest. Patient called EMS due to chest pain and dyspnea. He was found with 02 saturation of 60%, he was placed on non re-breather mask and was transported to the ED. At his arrival he developed a PEA cardiac arrest.  After 20 minutes of ACLS he recovered spontaneous circulation, required intubation and mechanical ventilation, admitted to the intensive care unit. At the time of his admission to the ICU his blood pressure was 109/29, HR 130, RR 21.  Sedation with propofol, lungs with decreased breath sounds bilaterally, heart with S1 and S2 present, irregularly irregular, abdomen with no distention and positive lower extremity edema.   In the ED patient received TNK for possible pulmonary embolism, that later was ruled out.   Chest radiograph with cardiomegaly with diffuse bilateral interstitial infiltrates. No effusions.   EKG 115 bpm, normal axis, normal intervals, atrial fibrillation rhythm with ST depression V4 to V6, negative T wave lead II, III, AvF.   CT chest with left chest wall injury with multiple fractures of the anterolateral left 3rd-7th ribs, displaced left 6th and 7th costochondral fractures and chest wall hematoma.  Non displaced fracture of the mid sternum.  Small left pleural effusion with no pneumothorax. Left lower lobe ground glass opacities, likely due to aspiration or contusion.  Indeterminate right adrenal gland nodule measuring 11 mm. (Non urgent follow up recommended).   Echocardiogram with reduced LV systolic function.    Diagnosed with cardiogenic shock and was placed on vasopressor and inotropic therapy.  Dobutamine, epinephrine, vasopressin and norepinephrine.   11/8 Decrease vent support as able, weaning vasoactive meds  11/9 Extubated. Getting diuresis added metolazone to lasix  11/10 Off all inotropic and pressor support. Back into AF w/ RVR so amio increased. Started Dig 11/11 Still in AF w/ RVR. L/RHC Findings: Prox ->mid RCA 100% stenosed, Prox RCA 60%, Ost LAD 70%, Mid LM to distal LM 70%, Ost Cx 100% stenosed, Mid LAD 95%, Prox LAD to mid LAD 50%, 2nd diag 40% 11/12 awaiting CVTS eval. SLP doing cognitive eval  11/14, AHF discussed with TCTS and plan is for CABG first week of December  11/15 [facility]   Assessment and Plan: Acute clinical systolic heart failure (HCC) Recovered cardiogenic shock sp cardiac arrest PEA.  Echocardiogram with reduced LV systolic function EF 35 to 40%, global hypokinesis, septal flattening, LV with mild dilatated internal cavity, RV systolic function with mild reduction, RV size with mild enlargement, RA with mild dilatation.   Volume status is negative, -33,860 ml since admission.  His urine output overt last 24 hrs is 4,555 ml Systolic blood pressure is 97 to 105 mmHg.   Medical therapy with digoxin, metoprolol, losartan and spironolactone.   Acute hypoxemic respiratory failure due to acute cardiogenic pulmonary edema.  Left pleural effusion. Left lower lobe aspiration pneumonia present on admission.  02 saturation today is 93% on room air.    Coronary artery disease 11/11 coronary angiography with severe 3 vessel heavily calcified coronary artery disease with CTO of Ostial LCx and mid RCA. Mid LAD 95%.  Plan for coronary artery bypass grafting when patient more stable.  Continue aspirin and rosuvastatin.    Atrial fibrillation (HCC) Patient continue atrial fibrillation with rate 118 bpm.  He has been on amiodarone for rate and rhythm control.   Anticoagulation with apixaban.   Plan for direct current cardioversion today. Continue telemetry monitoring.   Essential hypertension Continue blood pressure control with metoprolol and losartan.  Diuresis with torsemide and spironolactone.   Hyponatremia Renal function with serum cr at 0,92 with K at 4,1 and serum bicarbonate at 26. Na 131 Mg 2,0  Plan to continue torsemide and spironolactone. Follow up renal function in am.   Chronic anemia Cell count has been stable.  Hgb today is 9.2  Iron panel with serum iron 109, TIBC 249, transferrin saturation 44 and ferritin 690, consistent with anemia of chronic disease with no iron deficiency.    Pre-diabetes Fasting glucose today is 105 mg/dl.   Continue insulin sliding scale for glucose cover and monitoring.   Obesity, class 2 Calculated BMI is 36.4        Subjective: Patient is feeling well, no chest pain or dyspnea, no PND or orthopnea, no palpitations   Physical Exam: Vitals:   12/14/22 1415 12/14/22 1430 12/14/22 1445 12/14/22 1556  BP: 98/63 (!) 97/57 93/61 (!) 84/57  Pulse:      Resp: 19 20 16    Temp:    98.9 F (37.2 C)  TempSrc:    Oral  SpO2:      Weight:      Height:       Neurology awake and alert ENT With mild pallor Cardiovascular with S1 and S2 present, irregularly irregular with no gallops, rubs or murmurs No JVD Trace lower extremity edema Respiratory with no rales or wheezing on anterior auscultation  Abdomen with no distention  Data Reviewed:    Family Communication: no family at the bedside   Disposition: Status is: Inpatient Remains inpatient appropriate because: dc cardioversion, patient may need SNF or CIR prior to CABG   Planned Discharge Destination: Rehab   Author: Coralie Keens, MD 12/14/2022 4:12 PM  For on call review [email or web address].

## 2022-12-14 NOTE — Assessment & Plan Note (Signed)
Fasting glucose today is 96 mg/dl.   Will discontinue insulin and will check CBG only as needed.

## 2022-12-14 NOTE — CV Procedure (Signed)
   TRANSESOPHAGEAL ECHOCARDIOGRAM GUIDED DIRECT CURRENT CARDIOVERSION  NAME:  Jeremiah Dixon   MRN: [id number] DOB:  03-06-1960   ADMIT DATE: 12/06/2022  INDICATIONS:  Atrial fibrillation  PROCEDURE:   Informed consent was obtained prior to the procedure. The risks, benefits and alternatives for the procedure were discussed and the patient comprehended these risks.  Risks include, but are not limited to, cough, sore throat, vomiting, nausea, somnolence, esophageal and stomach trauma or perforation, bleeding, low blood pressure, aspiration, pneumonia, infection, trauma to the teeth and death.    After a procedural time-out, the oropharynx was anesthetized and the patient was sedated by the anesthesia service. The transesophageal probe was inserted in the esophagus and stomach without difficulty and multiple views were obtained.   FINDINGS:  LEFT VENTRICLE: EF = 35-40%  RIGHT VENTRICLE: Mid HK  LEFT ATRIUM: Severe LAE  LEFT ATRIAL APPENDAGE: No clot  RIGHT ATRIUM: Moderate RAE  AORTIC VALVE:  Trileaflet. No AS/AI  MITRAL VALVE:    Mild to moderate MR  TRICUSPID VALVE: Mild TR  PULMONIC VALVE: Trivial TR  INTERATRIAL SEPTUM: No PFO/ASD  PERICARDIUM: No effusion   DESCENDING AORTA: Mild to moderate plaque   CARDIOVERSION:     Indications:  Atrial Fibrillation  Procedure Details:  Once the TEE was complete, the patient had the defibrillator pads placed in the anterior and posterior position. Once an appropriate level of sedation was achieved, the patient received a single biphasic, synchronized 250J shock with prompt conversion to sinus rhythm. No apparent complications.   Arvilla Meres, MD  12:06 PM

## 2022-12-14 NOTE — Progress Notes (Signed)
Mobility Specialist Progress Note:   12/14/22 0900  Mobility  Activity Ambulated with assistance in hallway  Level of Assistance Contact guard assist, steadying assist  Assistive Device Front wheel walker  Distance Ambulated (ft) 250 ft  RUE Weight Bearing NWB  Activity Response Tolerated well  Mobility Referral Yes  $Mobility charge 1 Mobility  Mobility Specialist Start Time (ACUTE ONLY) 0911  Mobility Specialist Stop Time (ACUTE ONLY) 0927  Mobility Specialist Time Calculation (min) (ACUTE ONLY) 16 min    Pre Mobility: 89 HR During Mobility: 121 HR Post Mobility:  99 HR  Pt received in bed, eager for mobility. Mod I for bed mobility and STS. Asymptomatic w/ no complaints throughout ambulation. Returned to room w/o fault. Pt left in bed with call bell and all needs met.  Jeremiah Dixon Mobility Specialist Please contact via Special educational needs teacher or Rehab office at [phone]

## 2022-12-14 NOTE — Anesthesia Procedure Notes (Signed)
Procedure Name: MAC Date/Time: 12/14/2022 11:29 AM  Performed by: Debbe Odea, CRNAPre-anesthesia Checklist: Patient identified, Emergency Drugs available, Suction available, Patient being monitored and Timeout performed Patient Re-evaluated:Patient Re-evaluated prior to induction Oxygen Delivery Method: Nasal cannula Preoxygenation: Pre-oxygenation with 100% oxygen Induction Type: IV induction

## 2022-12-14 NOTE — Progress Notes (Signed)
PT Cancellation Note  Patient Details Name: Jeremiah Dixon MRN: [id number] DOB: 1960/12/06   Cancelled Treatment:    Reason Eval/Treat Not Completed: Patient at procedure or test/unavailable   [name] B [name] 12/14/2022, 11:29 AM [name], PT Acute Rehabilitation Services Office: [phone]

## 2022-12-14 NOTE — Progress Notes (Signed)
Pre-CABG testing has been completed. Preliminary results can be found in CV Proc through chart review.   12/14/22 3:16 PM Olen Cordial RVT

## 2022-12-14 NOTE — Progress Notes (Addendum)
Patient ID: Jeremiah Dixon, male   DOB: 03/22/1960, 62 y.o.   MRN: [id number]     Advanced Heart Failure Rounding Note  PCP-Cardiologist: None   Subjective:    Coox 67%. CVP not hooked up  Remains in rate controlled afib on Amio gtt.  CABG as outpatient per CTS 12/2. TEE/DCCV today  Feels fine today, just complaining of cough and pain around his ribs when he coughs. Has been using incentive spirometer and walking around the unit.   Objective:   Weight Range: 128.6 kg Body mass index is 36.4 kg/m.   Vital Signs:   Temp:  [98.2 F (36.8 C)-98.7 F (37.1 C)] 98.7 F (37.1 C) (11/15 0343) Pulse Rate:  [79-100] 99 (11/15 0400) Resp:  [10-22] 11 (11/15 0400) BP: (102-119)/(70-83) 105/83 (11/15 0400) SpO2:  [93 %-100 %] 95 % (11/15 0400) Weight:  [128.6 kg] 128.6 kg (11/15 0503) Last BM Date : 12/12/22  Weight change: Filed Weights   12/11/22 0500 12/12/22 0441 12/14/22 0503  Weight: 131.3 kg 130.3 kg 128.6 kg    Intake/Output:   Intake/Output Summary (Last 24 hours) at 12/14/2022 0735 Last data filed at 12/14/2022 0344 Gross per 24 hour  Intake 702.55 ml  Output 4555 ml  Net -3852.45 ml   Physical Exam  General:  well appearing.  No respiratory difficulty HEENT: normal Neck: supple. JVD ~8 cm. Carotids 2+ bilat; no bruits. No lymphadenopathy or thyromegaly appreciated. Cor: PMI nondisplaced. Regular rate & rhythm. No rubs, gallops or murmurs. Lungs: clear Abdomen: soft, nontender, nondistended. No hepatosplenomegaly. No bruits or masses. Good bowel sounds. Extremities: no cyanosis, clubbing, rash, +1 ankle edema. +UNNA boots Neuro: alert & oriented x 3, cranial nerves grossly intact. moves all 4 extremities w/o difficulty. Affect pleasant.   Telemetry   Afib 90s (personally reviewed)  EKG    N/a  Labs    CBC Recent Labs    12/13/22 0423 12/14/22 0452  WBC 7.9 6.3  HGB 9.1* 9.2*  HCT 27.6* 27.8*  MCV 94.8 94.9  PLT 284 297   Basic  Metabolic Panel Recent Labs    08/65/78 0423 12/13/22 1711 12/14/22 0452  NA 126* 129* 131*  K 3.6 4.5 4.1  CL 90* 93* 95*  CO2 27 27 26   GLUCOSE 96 152* 105*  BUN 11 16 15   CREATININE 1.18 1.14 0.92  CALCIUM 8.5* 8.8* 8.8*  MG 2.0  --  2.0   Liver Function Tests Recent Labs    12/11/22 1849  PROT 3.7*   No results for input(s): "LIPASE", "AMYLASE" in the last 72 hours. Cardiac Enzymes No results for input(s): "CKTOTAL", "CKMB", "CKMBINDEX", "TROPONINI" in the last 72 hours.  BNP: BNP (last 3 results) Recent Labs    12/06/22 0551  BNP 402.8*    ProBNP (last 3 results) No results for input(s): "PROBNP" in the last 8760 hours.   D-Dimer No results for input(s): "DDIMER" in the last 72 hours. Hemoglobin A1C No results for input(s): "HGBA1C" in the last 72 hours.  Fasting Lipid Panel No results for input(s): "CHOL", "HDL", "LDLCALC", "TRIG", "CHOLHDL", "LDLDIRECT" in the last 72 hours.  Thyroid Function Tests No results for input(s): "TSH", "T4TOTAL", "T3FREE", "THYROIDAB" in the last 72 hours.  Invalid input(s): "FREET3"  Other results:  Imaging   No results found.  Medications:    Scheduled Medications:  acetaminophen  650 mg Oral Q6H   apixaban  5 mg Oral BID   aspirin  81 mg Oral Daily   Chlorhexidine  Gluconate Cloth  6 each Topical Daily   chlorpheniramine-HYDROcodone  5 mL Oral Q12H   digoxin  0.125 mg Oral Daily   docusate sodium  100 mg Oral BID   famotidine  20 mg Oral BID   feeding supplement  237 mL Oral TID BM   folic acid  1 mg Oral Daily   guaiFENesin  1,200 mg Oral BID   insulin aspart  0-15 Units Subcutaneous Q4H   lidocaine  1 patch Transdermal Q24H   losartan  25 mg Oral Daily   metoprolol tartrate  37.5 mg Oral BID   multivitamin with minerals  1 tablet Oral Daily   polyethylene glycol  17 g Oral Daily   rosuvastatin  20 mg Oral Daily   sodium chloride flush  10 mL Intracatheter Q12H   spironolactone  25 mg Oral Daily    thiamine  100 mg Oral Daily   Or   thiamine  100 mg Intravenous Daily   torsemide  40 mg Oral Daily    Infusions:  amiodarone 30 mg/hr (12/13/22 2200)    PRN Medications: bisacodyl, ondansetron (ZOFRAN) IV, mouth rinse, oxyCODONE, traZODone  Patient Profile   Jeremiah Dixon is a 62 year old male with a history of CAD, HTN,  and HLD.    Presented w/ CP and had PEA arrest in the ED. ROSC ~ 25 minutes. TNK given for presumed PE. AHF team consulted for cardiogenic shock.   Assessment/Plan  1. Acute systolic CHF/Cardiogenic shock: Shock now resolved.  S/p PEA arrest 11/7.  Received tenecteplase in ER given concern for massive PE in setting of PEA arrest but could this all be CHF with new AF/RVR?  HS-TnI rose to 2815 post- arrest.  Bedside echo (somewhat difficult study) with LV EF difficult to quantify but looks in 35% range, RV moderately dilated/moderately dysfunctional with D-shaped septum, IVC dilated.  Extubated on 11/8 and now off all pressors. Diuresed well w/ IV Lasix. 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 Repeat Echo with EF 40-45%, RV nl. Coox 67%. - Continue Torsemide 40mg  PO. Unna boots in place. - Continue digoxin 0.125 - Continue Spironolactone 25 mg daily  - Continue losartan 25 mg daily.  - Would wait to transition to [facility] and start SGLT2i until post CABG   2. CAD: Known occlusion of OM1 in 2007.  HS-TnI 397 => 2815 post arrest, no STEMI on ECG.  Has received tenecteplase.  - LHC this admit severe 3v heavily calcified CAD with CTO of Ostial LCx and midRCA. Mid LAD 95%  - CTS consulted. Will proceed with CABG per Dr. Laneta Simmers first week of Dec.  - Continue ASA/statin.   3. 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  Has had tenecteplase.  Lower extremity venous dopplers with no DVT.  Will be hard to definitively confirm PE diagnosis at this point, but will get  long-term anticoagulation due to AF.   4. Atrial fibrillation: With RVR on admission, no prior history.  Converted to NSR on amiodarone initially but back in AF with RVR on 11/9. Currently rate controlled afib.  - Continue amiodarone gtt 60 mg/hr - Continue Eliquis 5mg  BID - Continue digoxin as above - TEE/DCCV today. Plan for CABG 12/2.  5. Acute hypoxemic respiratory failure: Extubated.  Stable on [place] for comfort - L pleural effusion noted on CXR, drained by CCM and  repeat CXR with residual effusion noted - Follow CXR  6. ETOH abuse: Per brother, "1/2 gallon beer" daily.   7. Hyponatremia: Hypervolemic hyponatremia. Na improved w/ tolvaptan  - 131 today - Continue diuretics per above  - Fluid restrict.    Length of Stay: 8  Alen Bleacher, NP  12/14/2022, 7:35 AM  Advanced Heart Failure Team Pager [phone] (M-F; 7a - 5p)  Please contact [facility] for night-coverage after hours (5p -7a ) and weekends on [email or web address]  Patient seen and examined with the above-signed Advanced Practice Provider and/or Housestaff. I personally reviewed laboratory data, imaging studies and relevant notes. I independently examined the patient and formulated the important aspects of the plan. I have edited the note to reflect any of my changes or salient points. I have personally discussed the plan with the patient and/or family.  Remains in AF on IV amio and Eliquis. Denies CP or SOB. Co-ox ok .   Remains weak. Working with PT. Worried about going home.   General: Sitting up in bed . No resp difficulty HEENT: normal Neck: supple. no JVD. Carotids 2+ bilat; no bruits. No lymphadenopathy or thryomegaly appreciated. Cor: PMI nondisplaced. Irregular rate & rhythm. No rubs, gallops or murmurs. + ecchymoses Lungs: clear Abdomen: obese soft, nontender, nondistended. No hepatosplenomegaly. No bruits or masses. Good bowel sounds. Extremities: no cyanosis, clubbing, rash, tr edema Neuro: alert & orientedx3,  cranial nerves grossly intact. moves all 4 extremities w/o difficulty. Affect pleasant  Continues to improve. Remains in AF. For TEE/DC-CV today.   Remains very weak. Hopefully will qualify for CIR (should be ready by tomorrow). Wilfrid Lund get CABG earl December  Arvilla Meres, MD  12:13 PM

## 2022-12-14 NOTE — Progress Notes (Signed)
Occupational Therapy Treatment Patient Details Name: Jeremiah Dixon MRN: [id number] DOB: [birth year] Today's Date: 12/14/2022   History of present illness Pt is a 62 y/o male presenting to the ED on 11/7 with respiratory arrest, hypoxia. In ED became unresponsive, pulseless and CPR 25 min. Intubated 11/7-11/8.  TNK given for PE concern. 11/11 heart cath planned. PMHx  CAD, HTN   OT comments  Pt. Seen for skilled OT treatment session. 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  Able to completed bed mobility in/out with good adherence wb precautions and no physical assistance required.  Lb dressing seated eob with education provided for energy conservation and safety strategies.  Pt. Receptive to education and verbalized understanding but required cues for implementation and initiation of rest breaks.  Cont. With acute ot poc.  Pt. Verbalized concerns of how to enter apt. With stairs.  Expressing concern of his d/c plans from now until his sx. That is sch. At beginning of dec.        If plan is discharge home, recommend the following:  A little help with walking and/or transfers;A little help with bathing/dressing/bathroom;Assistance with cooking/housework;Assist for transportation;Help with stairs or ramp for entrance;Supervision due to cognitive status   Equipment Recommendations       Recommendations for Other Services      Precautions / Restrictions Precautions Precautions: Fall Restrictions Weight Bearing Restrictions: Yes RUE Weight Bearing: Non weight bearing       Mobility Bed Mobility Overal bed mobility: Needs Assistance Bed Mobility: Supine to Sit, Sit to Supine     Supine to sit: Contact guard Sit to supine: Contact guard assist   General bed mobility comments: CGA encouraged no rails to simulate home env. pt. able to manage bles in/out of bed without physical assistance    Transfers Overall transfer level: Needs assistance Equipment used: Rolling walker (2  wheels) Transfers: Sit to/from Stand Sit to Stand: Supervision           General transfer comment: pt. able to transition into sit/stand and stand/sit with good adherance to nwb rue.  able to side step x2 towards hob prior to laying back down     Balance                                           ADL either performed or assessed with clinical judgement   ADL Overall ADL's : Needs assistance/impaired                     Lower Body Dressing: Set up;Cueing for compensatory techniques;Sitting/lateral leans Lower Body Dressing Details (indicate cue type and reason): pt. able to perform lb dressing (socks) reviewed compensatory strategies for energy conservation and safety.             Functional mobility during ADLs: Contact guard assist;Rolling walker (2 wheels) General ADL Comments: lb dressing with good tech. seated    Extremity/Trunk Assessment              Vision       Perception     Praxis      Cognition Arousal: Alert Behavior During Therapy: [facility] for tasks assessed/performed  Exercises      Shoulder Instructions       General Comments  Pt. Verbalizing gratitude and being so pleased and feeling supported during his stay here. 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     Pertinent Vitals/ Pain       Pain Assessment Pain Assessment: No/denies pain  Home Living                                          Prior Functioning/Environment              Frequency  Min 1X/week        Progress Toward Goals  OT Goals(current goals can now be found in the care plan section)  Progress towards OT goals: Progressing toward goals     Plan      Co-evaluation                 AM-PAC OT "6 Clicks" Daily Activity     Outcome Measure   Help from another person eating meals?: None Help  from another person taking care of personal grooming?: A Little Help from another person toileting, which includes using toliet, bedpan, or urinal?: A Lot Help from another person bathing (including washing, rinsing, drying)?: A Lot Help from another person to put on and taking off regular upper body clothing?: A Little Help from another person to put on and taking off regular lower body clothing?: A Lot 6 Click Score: 16    End of Session Equipment Utilized During Treatment: Gait belt;Rolling walker (2 wheels)  OT Visit Diagnosis: Unsteadiness on feet (R26.81);Muscle weakness (generalized) (M62.81);Other symptoms and signs involving cognitive function;Other abnormalities of gait and mobility (R26.89)   Activity Tolerance Patient tolerated treatment well   Patient Left in bed;with call bell/phone within reach;with bed alarm set   Nurse Communication Other (comment) (rn states ok to work with pt. today)        Time: 1610-9604 OT Time Calculation (min): 30 min  Charges: OT General Charges $OT Visit: 1 Visit OT Treatments $Self Care/Home Management : 23-37 mins  Boneta Lucks, COTA/L Acute Rehabilitation [phone]   Alessandra Bevels Lorraine-COTA/L 12/14/2022, 11:16 AM

## 2022-12-14 NOTE — Assessment & Plan Note (Addendum)
Recovered cardiogenic shock sp cardiac arrest PEA.  Echocardiogram with reduced LV systolic function EF 35 to 40%, global hypokinesis, septal flattening, LV with mild dilatated internal cavity, RV systolic function with mild reduction, RV size with mild enlargement, RA with mild dilatation.   Volume status is negative, -33,860 ml since admission.  His urine output overt last 24 hrs is 4,555 ml Systolic blood pressure is 97 to 105 mmHg.   Medical therapy with digoxin, metoprolol, losartan and spironolactone.   Acute hypoxemic respiratory failure due to acute cardiogenic pulmonary edema.  Left pleural effusion. Left lower lobe aspiration pneumonia present on admission.  02 saturation today is 93% on room air.

## 2022-12-14 NOTE — Anesthesia Postprocedure Evaluation (Signed)
Anesthesia Post Note  Patient: Jeremiah Dixon  Procedure(s) Performed: TRANSESOPHAGEAL ECHOCARDIOGRAM CARDIOVERSION (CATH LAB)     Patient location during evaluation: PACU Anesthesia Type: MAC Level of consciousness: awake and alert Pain management: pain level controlled Vital Signs Assessment: post-procedure vital signs reviewed and stable Respiratory status: spontaneous breathing Cardiovascular status: stable Anesthetic complications: no   No notable events documented.  Last Vitals:  Vitals:   12/14/22 1223 12/14/22 1235  BP: 97/69 (!) 84/62  Pulse: 79 80  Resp: 19 18  Temp:    SpO2: 96% 92%    Last Pain:  Vitals:   12/14/22 1253  TempSrc:   PainSc: 0-No pain                 Lewie Loron

## 2022-12-14 NOTE — Assessment & Plan Note (Addendum)
Patient continue atrial fibrillation with rate 118 bpm.  He has been on amiodarone for rate and rhythm control.  Anticoagulation with apixaban.   Plan for direct current cardioversion today. Continue telemetry monitoring.

## 2022-12-14 NOTE — Assessment & Plan Note (Signed)
Volume status has improved, renal function with serum cr at ,095 with K at 3,8 and serum bicarbonate at 29. Na 130 Mg 1.9   Plan to continue torsemide and spironolactone. Add 2 g Mag sulfate to prevent hypomagnesemia.  Follow up renal function and electrolytes in am.

## 2022-12-14 NOTE — Anesthesia Preprocedure Evaluation (Addendum)
Anesthesia Evaluation  Patient identified by MRN, date of birth, ID band Patient awake    Reviewed: Allergy & Precautions, NPO status , Patient's Chart, lab work & pertinent test results  Airway Mallampati: II  TM Distance: >3 FB Neck ROM: Full    Dental  (+) Chipped, Dental Advisory Given   Pulmonary former smoker   Pulmonary exam normal breath sounds clear to auscultation       Cardiovascular hypertension, Pt. on home beta blockers and Pt. on medications + Past MI  Normal cardiovascular exam+ Cardiac Defibrillator  Rhythm:Regular Rate:Normal  Echo 12/11/2022  1. Left ventricular ejection fraction, by estimation, is 40 to 45%. The left ventricle has mildly decreased function. The left ventricle demonstrates global hypokinesis.   2. Tricuspid regurgitation signal is inadequate for assessing PA pressure.   3. The inferior vena cava is normal in size with greater than 50% respiratory variability, suggesting right atrial pressure of 3 mmHg.   4. Consider repeat 2D echo once heart rate better controlled.      Neuro/Psych negative neurological ROS     GI/Hepatic Neg liver ROS,GERD  Medicated,,  Endo/Other    Class 3 obesity  Renal/GU negative Renal ROS     Musculoskeletal negative musculoskeletal ROS (+)    Abdominal  (+) + obese  Peds  Hematology negative hematology ROS (+)   Anesthesia Other Findings   Reproductive/Obstetrics                             Anesthesia Physical Anesthesia Plan  ASA: 4  Anesthesia Plan: MAC   Post-op Pain Management: Minimal or no pain anticipated   Induction: Intravenous  PONV Risk Score and Plan: 1 and Ondansetron, Treatment may vary due to age or medical condition, Propofol infusion and TIVA  Airway Management Planned: Simple Face Mask  Additional Equipment:   Intra-op Plan:   Post-operative Plan:   Informed Consent: I have reviewed the  patients History and Physical, chart, labs and discussed the procedure including the risks, benefits and alternatives for the proposed anesthesia with the patient or authorized representative who has indicated his/her understanding and acceptance.     Dental advisory given  Plan Discussed with: CRNA  Anesthesia Plan Comments:        Anesthesia Quick Evaluation

## 2022-12-14 NOTE — Plan of Care (Signed)
  Problem: Coping: Goal: Ability to adjust to condition or change in health will improve Outcome: Progressing   Problem: Nutritional: Goal: Maintenance of adequate nutrition will improve Outcome: Progressing Goal: Progress toward achieving an optimal weight will improve Outcome: Progressing   Problem: Skin Integrity: Goal: Risk for impaired skin integrity will decrease Outcome: Progressing   Problem: Tissue Perfusion: Goal: Adequacy of tissue perfusion will improve Outcome: Progressing   Problem: Clinical Measurements: Goal: Will remain free from infection Outcome: Progressing Goal: Diagnostic test results will improve Outcome: Progressing Goal: Respiratory complications will improve Outcome: Progressing Goal: Cardiovascular complication will be avoided Outcome: Progressing   Problem: Activity: Goal: Risk for activity intolerance will decrease Outcome: Progressing

## 2022-12-14 NOTE — Hospital Course (Addendum)
Mr. Jeremiah Dixon was admitted to the hospital with the working diagnosis of cardiac arrest, due to heart failure exacerbation.   62 yo male with the past medical history of coronary artery disease, hypertension, dyslipidemia and obesity who presented after having a cardiac arrest. Patient called EMS due to chest pain and dyspnea. He was found with 02 saturation of 60%, he was placed on non re-breather mask and was transported to the ED. At his arrival he developed a PEA cardiac arrest.  After 20 minutes of ACLS he recovered spontaneous circulation, required intubation and mechanical ventilation, admitted to the intensive care unit. At the time of his admission to the ICU his blood pressure was 109/29, HR 130, RR 21.  Sedation with propofol, lungs with decreased breath sounds bilaterally, heart with S1 and S2 present, irregularly irregular, abdomen with no distention and positive lower extremity edema.   In the ED patient received TNK for possible pulmonary embolism, that later was ruled out.   Chest radiograph with cardiomegaly with diffuse bilateral interstitial infiltrates. No effusions.   EKG 115 bpm, normal axis, normal intervals, atrial fibrillation rhythm with ST depression V4 to V6, negative T wave lead II, III, AvF.   CT chest with left chest wall injury with multiple fractures of the anterolateral left 3rd-7th ribs, displaced left 6th and 7th costochondral fractures and chest wall hematoma.  Non displaced fracture of the mid sternum.  Small left pleural effusion with no pneumothorax. Left lower lobe ground glass opacities, likely due to aspiration or contusion.  Indeterminate right adrenal gland nodule measuring 11 mm. (Non urgent follow up recommended).   Echocardiogram with reduced LV systolic function.   Diagnosed with cardiogenic shock and was placed on vasopressor and inotropic therapy.  Dobutamine, epinephrine, vasopressin and norepinephrine.   11/8 Decrease vent support as able,  weaning vasoactive meds  11/9 Extubated. Getting diuresis added metolazone to lasix  11/10 Off all inotropic and pressor support. Back into AF w/ RVR so amio increased. Started Dig 11/11 Still in AF w/ RVR. L/RHC Findings: Prox ->mid RCA 100% stenosed, Prox RCA 60%, Ost LAD 70%, Mid LM to distal LM 70%, Ost Cx 100% stenosed, Mid LAD 95%, Prox LAD to mid LAD 50%, 2nd diag 40% 11/12 awaiting CVTS eval. SLP doing cognitive eval  11/14, AHF discussed with TCTS and plan is for CABG first week of December  11/15 transfer to [facility]

## 2022-12-14 NOTE — Assessment & Plan Note (Signed)
Calculated BMI is 36.4

## 2022-12-15 DIAGNOSIS — I2510 Atherosclerotic heart disease of native coronary artery without angina pectoris: Secondary | ICD-10-CM | POA: Diagnosis not present

## 2022-12-15 DIAGNOSIS — I10 Essential (primary) hypertension: Secondary | ICD-10-CM | POA: Diagnosis not present

## 2022-12-15 DIAGNOSIS — I4891 Unspecified atrial fibrillation: Secondary | ICD-10-CM | POA: Diagnosis not present

## 2022-12-15 DIAGNOSIS — I5021 Acute systolic (congestive) heart failure: Secondary | ICD-10-CM | POA: Diagnosis not present

## 2022-12-15 LAB — CBC
HCT: 28.9 % — ABNORMAL LOW (ref 39.0–52.0)
Hemoglobin: 9.6 g/dL — ABNORMAL LOW (ref 13.0–17.0)
MCH: 31.5 pg (ref 26.0–34.0)
MCHC: 33.2 g/dL (ref 30.0–36.0)
MCV: 94.8 fL (ref 80.0–100.0)
Platelets: 352 10*3/uL (ref 150–400)
RBC: 3.05 MIL/uL — ABNORMAL LOW (ref 4.22–5.81)
RDW: 13.2 % (ref 11.5–15.5)
WBC: 6.5 10*3/uL (ref 4.0–10.5)
nRBC: 0 % (ref 0.0–0.2)

## 2022-12-15 LAB — GLUCOSE, CAPILLARY
Glucose-Capillary: 98 mg/dL (ref 70–99)
Glucose-Capillary: 90 mg/dL (ref 70–99)
Glucose-Capillary: 123 mg/dL — ABNORMAL HIGH (ref 70–99)
Glucose-Capillary: 94 mg/dL (ref 70–99)
Glucose-Capillary: 119 mg/dL — ABNORMAL HIGH (ref 70–99)

## 2022-12-15 LAB — BASIC METABOLIC PANEL
Anion gap: 9 (ref 5–15)
BUN: 15 mg/dL (ref 8–23)
CO2: 29 mmol/L (ref 22–32)
Calcium: 9 mg/dL (ref 8.9–10.3)
Chloride: 92 mmol/L — ABNORMAL LOW (ref 98–111)
Creatinine, Ser: 0.95 mg/dL (ref 0.61–1.24)
GFR, Estimated: >60 mL/min (ref >60)
Glucose, Bld: 96 mg/dL (ref 70–99)
Potassium: 3.8 mmol/L (ref 3.5–5.1)
Sodium: 130 mmol/L — ABNORMAL LOW (ref 135–145)

## 2022-12-15 LAB — BODY FLUID CULTURE W GRAM STAIN
Culture: NO GROWTH
Gram Stain: NONE SEEN

## 2022-12-15 LAB — COOXEMETRY PANEL
Carboxyhemoglobin: 2.5 % — ABNORMAL HIGH (ref 0.5–1.5)
Methemoglobin: <0.7 % (ref 0.0–1.5)
O2 Saturation: 65.4 %
Total hemoglobin: 10.4 g/dL — ABNORMAL LOW (ref 12.0–16.0)

## 2022-12-15 LAB — MAGNESIUM: Magnesium: 1.9 mg/dL (ref 1.7–2.4)

## 2022-12-15 MED ORDER — MAGNESIUM SULFATE 2 GM/50ML IV SOLN
2.0000 g | Freq: Once | INTRAVENOUS | Status: AC
  Administered 2022-12-15: 2 g via INTRAVENOUS
  Filled 2022-12-15: qty 50

## 2022-12-15 MED ORDER — AMIODARONE HCL 200 MG PO TABS
400.0000 mg | ORAL_TABLET | Freq: Two times a day (BID) | ORAL | Status: DC
  Administered 2022-12-15 – 2022-12-19 (×9): 400 mg via ORAL
  Filled 2022-12-15 (×9): qty 2

## 2022-12-15 MED ORDER — METOPROLOL SUCCINATE ER 50 MG PO TB24
50.0000 mg | ORAL_TABLET | Freq: Every day | ORAL | Status: DC
  Administered 2022-12-15 – 2022-12-19 (×5): 50 mg via ORAL
  Filled 2022-12-15 (×5): qty 1

## 2022-12-15 NOTE — Progress Notes (Signed)
Patient ID: Jeremiah Dixon, male   DOB: December 25, 1960, 62 y.o.   MRN: [id number]     Advanced Heart Failure Rounding Note  PCP-Cardiologist: None   Subjective:    Had DC-CV yesterday (11/15). Remains in NSR   Denies CP or SOB. Remains weak. Working with PT/OT  CIR following    Objective:   Weight Range: 126.6 kg Body mass index is 35.83 kg/m.   Vital Signs:   Temp:  [97.6 F (36.4 C)-99 F (37.2 C)] 98.8 F (37.1 C) (11/16 1106) Pulse Rate:  [70-89] 85 (11/16 1106) Resp:  [10-20] 10 (11/16 0500) BP: (84-122)/(56-74) 107/70 (11/16 1106) SpO2:  [92 %-96 %] 94 % (11/16 1106) Weight:  [126.6 kg] 126.6 kg (11/16 0500) Last BM Date : 12/14/22  Weight change: Filed Weights   12/12/22 0441 12/14/22 0503 12/15/22 0500  Weight: 130.3 kg 128.6 kg 126.6 kg    Intake/Output:   Intake/Output Summary (Last 24 hours) at 12/15/2022 1116 Last data filed at 12/15/2022 0542 Gross per 24 hour  Intake 874.09 ml  Output 2950 ml  Net -2075.91 ml   Physical Exam   General:  Sitting in chair. No resp difficulty HEENT: normal Neck: supple. no JVD. Carotids 2+ bilat; no bruits. No lymphadenopathy or thryomegaly appreciated. Cor: PMI nondisplaced. Regular rate & rhythm. No rubs, gallops or murmurs. Lungs: clear Abdomen: obese soft, nontender, nondistended. No hepatosplenomegaly. No bruits or masses. Good bowel sounds. Extremities: no cyanosis, clubbing, rash, tr edema + UNNA Neuro: alert & orientedx3, cranial nerves grossly intact. moves all 4 extremities w/o difficulty. Affect pleasant   Telemetry   Sinus 80s Personally reviewed  Labs    CBC Recent Labs    12/14/22 0452 12/15/22 0550  WBC 6.3 6.5  HGB 9.2* 9.6*  HCT 27.8* 28.9*  MCV 94.9 94.8  PLT 297 352   Basic Metabolic Panel Recent Labs    40/98/11 0452 12/15/22 0550  NA 131* 130*  K 4.1 3.8  CL 95* 92*  CO2 26 29  GLUCOSE 105* 96  BUN 15 15  CREATININE 0.92 0.95  CALCIUM 8.8* 9.0  MG 2.0 1.9    Liver Function Tests No results for input(s): "AST", "ALT", "ALKPHOS", "BILITOT", "PROT", "ALBUMIN" in the last 72 hours.  No results for input(s): "LIPASE", "AMYLASE" in the last 72 hours. Cardiac Enzymes No results for input(s): "CKTOTAL", "CKMB", "CKMBINDEX", "TROPONINI" in the last 72 hours.  BNP: BNP (last 3 results) Recent Labs    12/06/22 0551  BNP 402.8*    ProBNP (last 3 results) No results for input(s): "PROBNP" in the last 8760 hours.   D-Dimer No results for input(s): "DDIMER" in the last 72 hours. Hemoglobin A1C No results for input(s): "HGBA1C" in the last 72 hours.  Fasting Lipid Panel No results for input(s): "CHOL", "HDL", "LDLCALC", "TRIG", "CHOLHDL", "LDLDIRECT" in the last 72 hours.  Thyroid Function Tests No results for input(s): "TSH", "T4TOTAL", "T3FREE", "THYROIDAB" in the last 72 hours.  Invalid input(s): "FREET3"  Other results:  Imaging   VAS US DOPPLER PRE CABG  Result Date: 12/14/2022 PREOPERATIVE VASCULAR EVALUATION Patient Name:  Jeremiah Dixon  Date of Exam:   12/14/2022 Medical Rec #: [id number]             Accession #:    [id number] Date of Birth: December 05, 1960             Patient Gender: M Patient Age:   81 years Exam Location:  [facility] Procedure:  VAS US DOPPLER PRE CABG Referring Phys: BRYAN BARTLE --------------------------------------------------------------------------------  Indications:      Pre-CABG. Risk Factors:     Hypertension, hyperlipidemia. Limitations:      Left neck central line/bandages, lower extremity bandages                   (toes to knees) Comparison Study: No prior studies. Performing Technologist: Olen Cordial RVT  Examination Guidelines: A complete evaluation includes B-mode imaging, spectral Doppler, color Doppler, and power Doppler as needed of all accessible portions of each vessel. Bilateral testing is considered an integral part of a complete examination. Limited examinations for  reoccurring indications may be performed as noted.  Right Carotid Findings: +----------+--------+--------+--------+-----------------------+--------+           PSV cm/sEDV cm/sStenosisDescribe               Comments +----------+--------+--------+--------+-----------------------+--------+ CCA Prox  91      20              smooth and heterogenous         +----------+--------+--------+--------+-----------------------+--------+ CCA Distal80      23              smooth and heterogenous         +----------+--------+--------+--------+-----------------------+--------+ ICA Prox  43      13              smooth and heterogenous         +----------+--------+--------+--------+-----------------------+--------+ ICA Mid   48      18                                     tortuous +----------+--------+--------+--------+-----------------------+--------+ ICA Distal50      21                                     tortuous +----------+--------+--------+--------+-----------------------+--------+ ECA       67      9                                               +----------+--------+--------+--------+-----------------------+--------+ +----------+--------+-------+--------+------------+           PSV cm/sEDV cmsDescribeArm Pressure +----------+--------+-------+--------+------------+ Subclavian107                                 +----------+--------+-------+--------+------------+ +---------+--------+--+--------+-+---------+ VertebralPSV cm/s27EDV cm/s7Antegrade +---------+--------+--+--------+-+---------+ Left Carotid Findings: +----------+--------+--------+--------+-----------------------+--------------+           PSV cm/sEDV cm/sStenosisDescribe               Comments       +----------+--------+--------+--------+-----------------------+--------------+ CCA Prox  70      19              smooth and heterogenous                +----------+--------+--------+--------+-----------------------+--------------+ CCA Distal72      24              smooth and heterogenous               +----------+--------+--------+--------+-----------------------+--------------+ ICA Prox  Not visualized +----------+--------+--------+--------+-----------------------+--------------+ ICA Mid                                                  Not visualized +----------+--------+--------+--------+-----------------------+--------------+ ICA Distal                                               Not visualized +----------+--------+--------+--------+-----------------------+--------------+ ECA                                                      Not visualized +----------+--------+--------+--------+-----------------------+--------------+ +----------+--------+--------+--------+------------+ SubclavianPSV cm/sEDV cm/sDescribeArm Pressure +----------+--------+--------+--------+------------+           105                                  +----------+--------+--------+--------+------------+ +---------+--------+--------+--------------+ VertebralPSV cm/sEDV cm/sNot visualized +---------+--------+--------+--------------+  ABI Findings: +------------------+-----+---------+ Rt Pressure (mmHg)IndexWaveform  +------------------+-----+---------+ 95                     triphasic +------------------+-----+---------+ 100               0.94 triphasic +------------------+-----+---------+ +------------------+-----+---------+ Lt Pressure (mmHg)IndexWaveform  +------------------+-----+---------+ 106                    triphasic +------------------+-----+---------+ 102               0.96 triphasic +------------------+-----+---------+ +-------+---------------+ ABI/TBIToday's ABI/TBI +-------+---------------+ Right  0.94            +-------+---------------+ Left   0.96             +-------+---------------+  Right Doppler Findings: +--------+--------+---------+ Site    PressureDoppler   +--------+--------+---------+ [id number]      triphasic +--------+--------+---------+ Radial          triphasic +--------+--------+---------+ Ulnar           triphasic +--------+--------+---------+  Left Doppler Findings: +--------+--------+---------+ Site    PressureDoppler   +--------+--------+---------+ [id number]     triphasic +--------+--------+---------+ Radial          triphasic +--------+--------+---------+ Ulnar           triphasic +--------+--------+---------+   Summary: Right Carotid: Velocities in the right ICA are consistent with a 1-39% stenosis. Vertebrals: Right vertebral artery demonstrates antegrade flow. Right ABI: Resting right ankle-brachial index indicates mild right lower extremity arterial disease. Left ABI: Resting left ankle-brachial index is within normal range. Right Upper Extremity: Doppler waveform obliterate with right radial compression. Doppler waveforms decrease 50% with right ulnar compression. Left Upper Extremity: Doppler waveform obliterate with left radial compression. Doppler waveforms decrease >50% with left ulnar compression.  Electronically signed by Lemar Livings MD on 12/14/2022 at 6:58:19 PM.    Final    EP STUDY  Result Date: 12/14/2022 See surgical note for result.   Medications:    Scheduled Medications:  acetaminophen  650 mg Oral Q6H   apixaban  5 mg Oral BID   aspirin  81 mg Oral Daily   Chlorhexidine Gluconate Cloth  6 each Topical Daily  chlorpheniramine-HYDROcodone  5 mL Oral Q12H   digoxin  0.125 mg Oral Daily   docusate sodium  100 mg Oral BID   famotidine  20 mg Oral BID   feeding supplement  237 mL Oral TID BM   folic acid  1 mg Oral Daily   guaiFENesin  1,200 mg Oral BID   insulin aspart  0-15 Units Subcutaneous Q4H   lidocaine  1 patch Transdermal Q24H   losartan  25 mg Oral Daily    metoprolol tartrate  37.5 mg Oral BID   multivitamin with minerals  1 tablet Oral Daily   polyethylene glycol  17 g Oral Daily   rosuvastatin  20 mg Oral Daily   sodium chloride flush  10 mL Intracatheter Q12H   spironolactone  25 mg Oral Daily   thiamine  100 mg Oral Daily   Or   thiamine  100 mg Intravenous Daily   torsemide  40 mg Oral Daily    Infusions:  amiodarone 30 mg/hr (12/15/22 0334)    PRN Medications: bisacodyl, ondansetron (ZOFRAN) IV, mouth rinse, oxyCODONE, traZODone  Patient Profile   Jeremiah Dixon is a 62 year old male with a history of CAD, HTN,  and HLD.    Presented w/ CP and had PEA arrest in the ED. ROSC ~ 25 minutes. TNK given for presumed PE. AHF team consulted for cardiogenic shock.   Assessment/Plan  1. Acute systolic CHF/Cardiogenic shock: Shock now resolved.  S/p PEA arrest 11/7.  Received tenecteplase in ER given concern for massive PE in setting of PEA arrest but could this all be CHF with new AF/RVR?  HS-TnI rose to 2815 post- arrest.  Bedside echo (somewhat difficult study) with LV EF difficult to quantify but looks in 35% range, RV moderately dilated/moderately dysfunctional with D-shaped septum, IVC dilated.  Extubated on 11/8 and now off all pressors. Diuresed well w/ IV Lasix. 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 Repeat Echo with EF 40-45%, RV nl. - TEE 11/15 EF 35-40% Co-ox 65% - Volume status looks good Continue Torsemide 40mg  PO. - Continue digoxin 0.125 - Continue Spironolactone 25 mg daily  - Continue losartan 25 mg daily.  - On metoprolol tartrate 37.5 bid. Will switch to metoprolol succinate 50 daily - Would wait to transition to [facility] and start SGLT2i until post CABG   2. CAD: Known occlusion of OM1 in 2007.  HS-TnI 397 => 2815 post arrest, no STEMI on ECG.  Has received tenecteplase.  - LHC this admit severe 3v heavily calcified  CAD with CTO of Ostial LCx and midRCA. Mid LAD 95%  - CTS consulted. Will proceed with CABG per Dr. Laneta Simmers first week of Dec. (12/2)  - Continue ASA/statin.   3. 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  Has had tenecteplase.  Lower extremity venous dopplers with no DVT.  Will be hard to definitively confirm PE diagnosis at this point, but will get long-term anticoagulation due to AF.  - doubt PE Suspect initial presentation was HF/AF  4. Atrial fibrillation: With RVR on admission, no prior history.  Converted to NSR on amiodarone initially but back in AF with RVR on 11/9.  - s/p TEE/DC-CV on 11/15.  - Remains in NSR - Switch amio to 400 bid - Continue Eliquis 5mg  BID - Continue digoxin as above  5. Acute hypoxemic respiratory failure: Extubated.  Stable on [place] for comfort - L pleural effusion noted on CXR, drained by CCM and repeat CXR with residual effusion noted - Resolved  6. ETOH abuse: Per brother, "1/2 gallon beer" daily.   7. Hyponatremia: Hypervolemic hyponatremia. Na improved w/ tolvaptan  - 130 today - Avoid FW  8. Severe deconditioning - Lives in 2nd floor apt. Unable to get up steps.  - Hopefully can be discharged to CIR for prehab prior to CABG    Length of Stay: 9  Arvilla Meres, MD  12/15/2022, 11:16 AM  Advanced Heart Failure Team Pager [phone] (M-F; 7a - 5p)  Please contact [facility] for night-coverage after hours (5p -7a ) and weekends on [email or web address]

## 2022-12-15 NOTE — Progress Notes (Signed)
Mobility Specialist Progress Note    12/15/22 0944  Mobility  Activity Ambulated with assistance in hallway  Level of Assistance Contact guard assist, steadying assist  Assistive Device Front wheel walker  Distance Ambulated (ft) 800 ft  Activity Response Tolerated well  Mobility Referral Yes  $Mobility charge 1 Mobility  Mobility Specialist Start Time (ACUTE ONLY) [id number]  Mobility Specialist Stop Time (ACUTE ONLY) 0943  Mobility Specialist Time Calculation (min) (ACUTE ONLY) 15 min   During Mobility: 100 HR Post-Mobility: 88 HR, 93% SpO2  Pt received in doorway with NT. No complaints. Encouraged pursed lip breathing. Returned to chair with call bell in reach and RN present.   [place] Mobility Specialist  Please Neurosurgeon or Rehab Office at [phone]

## 2022-12-15 NOTE — Progress Notes (Signed)
Progress Note   Patient: Jeremiah Dixon XBJ:[id number] DOB: 01-22-1961 DOA: 12/06/2022     9 DOS: the patient was seen and examined on 12/15/2022   Brief hospital course: Mr. Marcin was admitted to the hospital with the working diagnosis of cardiac arrest, due to heart failure exacerbation.   62 yo male with the past medical history of coronary artery disease, hypertension, dyslipidemia and obesity who presented after having a cardiac arrest. Patient called EMS due to chest pain and dyspnea. He was found with 02 saturation of 60%, he was placed on non re-breather mask and was transported to the ED. At his arrival he developed a PEA cardiac arrest.  After 20 minutes of ACLS he recovered spontaneous circulation, required intubation and mechanical ventilation, admitted to the intensive care unit. At the time of his admission to the ICU his blood pressure was 109/29, HR 130, RR 21.  Sedation with propofol, lungs with decreased breath sounds bilaterally, heart with S1 and S2 present, irregularly irregular, abdomen with no distention and positive lower extremity edema.   In the ED patient received TNK for possible pulmonary embolism, that later was ruled out.   Chest radiograph with cardiomegaly with diffuse bilateral interstitial infiltrates. No effusions.   EKG 115 bpm, normal axis, normal intervals, atrial fibrillation rhythm with ST depression V4 to V6, negative T wave lead II, III, AvF.   CT chest with left chest wall injury with multiple fractures of the anterolateral left 3rd-7th ribs, displaced left 6th and 7th costochondral fractures and chest wall hematoma.  Non displaced fracture of the mid sternum.  Small left pleural effusion with no pneumothorax. Left lower lobe ground glass opacities, likely due to aspiration or contusion.  Indeterminate right adrenal gland nodule measuring 11 mm. (Non urgent follow up recommended).   Echocardiogram with reduced LV systolic function.    Diagnosed with cardiogenic shock and was placed on vasopressor and inotropic therapy.  Dobutamine, epinephrine, vasopressin and norepinephrine, milrinone.   11/8 Decrease vent support as able, weaning vasoactive meds  11/9 Extubated. Getting diuresis added metolazone to lasix  11/10 Off all inotropic and pressor support. Back into AF w/ RVR so amio increased. Started Dig 11/11 Still in AF w/ RVR. L/RHC Findings: Prox ->mid RCA 100% stenosed, Prox RCA 60%, Ost LAD 70%, Mid LM to distal LM 70%, Ost Cx 100% stenosed, Mid LAD 95%, Prox LAD to mid LAD 50%, 2nd diag 40% 11/12 awaiting CVTS eval. SLP doing cognitive eval  11/14, AHF discussed with TCTS and plan is for CABG first week of December  11/15 transfer to [facility]. DC cardioversion converting to sinus rhythm.  11/16 discontinue foley and central line. Transitioning to oral amiodarone.   Assessment and Plan: Acute clinical systolic heart failure (HCC) Recovered cardiogenic shock sp cardiac arrest PEA.  Echocardiogram with reduced LV systolic function EF 35 to 40%, global hypokinesis, septal flattening, LV with mild dilatated internal cavity, RV systolic function with mild reduction, RV size with mild enlargement, RA with mild dilatation.   His urine output 3,400 ml Systolic blood pressure is 105 to 127 mmHg.  SVO2 65.4   Medical therapy with digoxin, metoprolol, losartan and spironolactone.  Loop diuretic with torsemide.   Acute hypoxemic respiratory failure due to acute cardiogenic pulmonary edema.  Left pleural effusion. Left lower lobe aspiration pneumonia present on admission.  Completed antibiotic therapy with Unasyn.  Today 02 saturation today is 94% on room air.    Coronary artery disease 11/11 coronary angiography with severe 3  vessel heavily calcified coronary artery disease with CTO of Ostial LCx and mid RCA. Mid LAD 95%.   Plan for coronary artery bypass grafting when patient more stable.  Continue aspirin and  rosuvastatin.    Atrial fibrillation (HCC) 11/15 TEE/ direct current cardioversion, returning to sinus rhythm.  Heart rate 80 to 90 sinus rhythm on telemetry, (personally reviewed).  Continue with amiodarone, transition to po from IV per protocol.  Anticoagulation with apixaban.   Essential hypertension Continue blood pressure control with metoprolol and losartan.  Diuresis with torsemide and spironolactone.   Hyponatremia Volume status has improved, renal function with serum cr at ,095 with K at 3,8 and serum bicarbonate at 29. Na 130 Mg 1.9   Plan to continue torsemide and spironolactone. Add 2 g Mag sulfate to prevent hypomagnesemia.  Follow up renal function and electrolytes in am.   Chronic anemia Cell count has been stable.  Hgb today is 9.2  Iron panel with serum iron 109, TIBC 249, transferrin saturation 44 and ferritin 690, consistent with anemia of chronic disease with no iron deficiency.    Pre-diabetes Fasting glucose today is 96 mg/dl.   Will discontinue insulin and will check CBG only as needed.   Obesity, class 2 Calculated BMI is 36.4        Subjective: Patient is feeling better, his dyspnea has improved along with his edema. Mild chest pain due to rib fractures.   Physical Exam: Vitals:   12/15/22 0500 12/15/22 0724 12/15/22 1106 12/15/22 1140  BP:  122/74 107/70 127/75  Pulse: 70  85   Resp: 10     Temp:  98.8 F (37.1 C) 98.8 F (37.1 C)   TempSrc:  Oral Oral   SpO2: 92%  94%   Weight: 126.6 kg     Height:       Neurology awake and alert ENT with mild pallor Cardiovascular with S1 and S2 present and regular with no gallops, rubs or murmurs Respiratory with no rales or wheezing, no rhonchi Abdomen with no distention  Trace lower extremity edema unna boots in place  Data Reviewed:    Family Communication: no family at the bedside   Disposition: Status is: Inpatient Remains inpatient appropriate because: pending transfer to  possible CIR   Planned Discharge Destination: Rehab     Author: Coralie Keens, MD 12/15/2022 1:16 PM  For on call review [email or web address].

## 2022-12-15 NOTE — Progress Notes (Signed)
Physical Therapy Treatment Patient Details Name: Jeremiah Dixon MRN: [id number] DOB: 07/03/1960 Today's Date: 12/15/2022   History of Present Illness Pt is a 62 y/o male presenting to the ED on 11/7 with respiratory arrest, hypoxia. In ED became unresponsive, pulseless and CPR 25 min. Intubated 11/7-11/8.  TNK given for PE concern. 11/11 radial access heart cath. 11/15 dCCV. PMHx  CAD, HTN    PT Comments  Pt very anxious regarding returning home alone upon discharge due to having 16 steps to enter home, minimal space to use RW in apt, inability to take the trash out, laundry, or grocery shopping. Pt having a hard time understanding concept of "rehab" as he only feels "rehab" is needed after he has his open heart surgery that's scheduled for 12/2 (per pt). Spent extensive time trying to help patient navigate his options upon d/c prior to his surgery and then possible options post surgery. Pt educated on what "inpatient rehab" was vs SNF and even though AIR is in [facility] that it is a separate entity than [facility]. Pt to talk to brother to see if he could stay with him until his open heart surgery. Pt also focused on "where am I going to walk to get stronger. I only have a breezeway at my apartment and the boards are uneven. I'll fall." Strongly encouraged pt to talk to his brother about staying with him until his surgery. Acute PT to cont to follow.   If plan is discharge home, recommend the following: Assistance with cooking/housework;Assist for transportation;A little help with bathing/dressing/bathroom;A little help with walking and/or transfers;Direct supervision/assist for medications management;Help with stairs or ramp for entrance   Can travel by private vehicle        Equipment Recommendations  Rolling walker (2 wheels)    Recommendations for Other Services       Precautions / Restrictions Precautions Precautions: Fall Restrictions Weight Bearing Restrictions: No RUE Weight  Bearing: Non weight bearing     Mobility  Bed Mobility               General bed mobility comments: pt received sitting up in chair    Transfers Overall transfer level: Needs assistance Equipment used: Rolling walker (2 wheels) Transfers: Sit to/from Stand Sit to Stand: Supervision           General transfer comment: verbal cues not to pull up on walker and to place hands on arm rests, pt stated "I want to use my trunk muscles not my hands to stand." PT instructed pt to then place hands on knees.    Ambulation/Gait Ambulation/Gait assistance: Min assist Gait Distance (Feet): 200 Feet Assistive device: Rolling walker (2 wheels) Gait Pattern/deviations: Step-through pattern, Decreased stride length, Trunk flexed Gait velocity: dec Gait velocity interpretation: 1.31 - 2.62 ft/sec, indicative of limited community ambulator   General Gait Details: pt with improved fluidity of gait pattern and abiltiy to maintain upright trunk. Pt declined trying to amb without RW due to "I"m just not ready"   Stairs Stairs: Yes Stairs assistance: Contact guard assist Stair Management: One rail Right, Step to pattern, Sideways Number of Stairs: 4 (limited by IV pole) General stair comments: pt very anxious, noted SOB increased RR however quickly recovered once returned to room   Wheelchair Mobility     Tilt Bed    Modified Rankin (Stroke Patients Only)       Balance Overall balance assessment: Needs assistance Sitting-balance support: No upper extremity supported, Feet supported Sitting balance-Leahy  Scale: Fair     Standing balance support: Bilateral upper extremity supported, During functional activity, Reliant on assistive device for balance Standing balance-Leahy Scale: Fair                              Cognition Arousal: Alert Behavior During Therapy: Anxious Overall Cognitive Status: No family/caregiver present to determine baseline cognitive  functioning Area of Impairment: Safety/judgement, Memory, Awareness, Problem solving                   Current Attention Level: Sustained Memory: Decreased short-term memory Following Commands: Follows one step commands consistently Safety/Judgement: Decreased awareness of deficits Awareness: Emergent   General Comments: pt with difficulty understanding concept of "rehab" and how that correlates with his medical care not just post his open heart surgery. Pt very anxious returning home alone regarding his living situation and being alone. He is also very focused on exercising to get strong prior to surgery.        Exercises      General Comments General comments (skin integrity, edema, etc.): SOB with stairs, VSS      Pertinent Vitals/Pain Pain Assessment Faces Pain Scale: Hurts even more Pain Location: chest with cough Pain Descriptors / Indicators: Sore    Home Living                          Prior Function            PT Goals (current goals can now be found in the care plan section) Acute Rehab PT Goals Patient Stated Goal: Independent, back to work. PT Goal Formulation: With patient Time For Goal Achievement: 12/21/22 Potential to Achieve Goals: Good Progress towards PT goals: Progressing toward goals    Frequency    Min 1X/week      PT Plan      Co-evaluation              AM-PAC PT "6 Clicks" Mobility   Outcome Measure  Help needed turning from your back to your side while in a flat bed without using bedrails?: A Little Help needed moving from lying on your back to sitting on the side of a flat bed without using bedrails?: A Little Help needed moving to and from a bed to a chair (including a wheelchair)?: A Little Help needed standing up from a chair using your arms (e.g., wheelchair or bedside chair)?: A Little Help needed to walk in hospital room?: A Little Help needed climbing 3-5 steps with a railing? : A Little 6 Click  Score: 18    End of Session   Activity Tolerance: Patient tolerated treatment well Patient left: with call bell/phone within reach;with nursing/sitter in room;in chair Nurse Communication: Mobility status PT Visit Diagnosis: Other abnormalities of gait and mobility (R26.89);Muscle weakness (generalized) (M62.81)     Time: 1610-9604 PT Time Calculation (min) (ACUTE ONLY): 36 min  Charges:    $Gait Training: 8-22 mins $Therapeutic Activity: 8-22 mins PT General Charges $$ ACUTE PT VISIT: 1 Visit                     Lewis Shock, PT, DPT Acute Rehabilitation Services Secure chat preferred Office #: [phone]    Iona Hansen 12/15/2022, 3:07 PM

## 2022-12-15 NOTE — Progress Notes (Signed)
Inpatient Rehab Admissions:   Late entry:  Spoke to patient Friday morning regarding CIR recommendations. He was preparing for test and stated he thought rehab was for after his surgery in December. We discussed "prehab" and seeing how he did with therapy today. Note CGA/supervision with OT, unable to see PT. We will follow up with him on Monday.   Estill Dooms, PT, DPT

## 2022-12-16 ENCOUNTER — Encounter (HOSPITAL_COMMUNITY): Payer: Self-pay | Admitting: Pulmonary Disease

## 2022-12-16 DIAGNOSIS — I2510 Atherosclerotic heart disease of native coronary artery without angina pectoris: Secondary | ICD-10-CM | POA: Diagnosis not present

## 2022-12-16 DIAGNOSIS — I4891 Unspecified atrial fibrillation: Secondary | ICD-10-CM | POA: Diagnosis not present

## 2022-12-16 DIAGNOSIS — I5021 Acute systolic (congestive) heart failure: Secondary | ICD-10-CM | POA: Diagnosis not present

## 2022-12-16 DIAGNOSIS — I10 Essential (primary) hypertension: Secondary | ICD-10-CM | POA: Diagnosis not present

## 2022-12-16 LAB — BASIC METABOLIC PANEL
Anion gap: 12 (ref 5–15)
BUN: 19 mg/dL (ref 8–23)
CO2: 25 mmol/L (ref 22–32)
Calcium: 9 mg/dL (ref 8.9–10.3)
Chloride: 90 mmol/L — ABNORMAL LOW (ref 98–111)
Creatinine, Ser: 0.87 mg/dL (ref 0.61–1.24)
GFR, Estimated: >60 mL/min (ref >60)
Glucose, Bld: 127 mg/dL — ABNORMAL HIGH (ref 70–99)
Potassium: 3.9 mmol/L (ref 3.5–5.1)
Sodium: 127 mmol/L — ABNORMAL LOW (ref 135–145)

## 2022-12-16 LAB — GLUCOSE, CAPILLARY
Glucose-Capillary: 128 mg/dL — ABNORMAL HIGH (ref 70–99)
Glucose-Capillary: 98 mg/dL (ref 70–99)
Glucose-Capillary: 95 mg/dL (ref 70–99)
Glucose-Capillary: 92 mg/dL (ref 70–99)

## 2022-12-16 LAB — MAGNESIUM: Magnesium: 2 mg/dL (ref 1.7–2.4)

## 2022-12-16 MED ORDER — MORPHINE SULFATE (PF) 2 MG/ML IV SOLN
2.0000 mg | INTRAVENOUS | Status: DC | PRN
  Administered 2022-12-16 – 2022-12-19 (×4): 2 mg via INTRAVENOUS
  Filled 2022-12-16 (×4): qty 1

## 2022-12-16 MED ORDER — PSYLLIUM 95 % PO PACK
1.0000 | PACK | Freq: Every day | ORAL | Status: DC
  Administered 2022-12-16 – 2022-12-19 (×4): 1 via ORAL
  Filled 2022-12-16 (×4): qty 1

## 2022-12-16 MED ORDER — HYDROMORPHONE HCL 1 MG/ML IJ SOLN
0.5000 mg | INTRAMUSCULAR | Status: AC
  Administered 2022-12-16: 0.5 mg via INTRAVENOUS
  Filled 2022-12-16: qty 0.5

## 2022-12-16 MED ORDER — GUAIFENESIN ER 600 MG PO TB12
600.0000 mg | ORAL_TABLET | Freq: Two times a day (BID) | ORAL | Status: DC | PRN
  Administered 2022-12-16 – 2022-12-18 (×2): 600 mg via ORAL
  Filled 2022-12-16 (×2): qty 1

## 2022-12-16 MED ORDER — OXYCODONE HCL 5 MG PO TABS
10.0000 mg | ORAL_TABLET | ORAL | Status: DC | PRN
  Administered 2022-12-16 – 2022-12-17 (×2): 10 mg via ORAL
  Filled 2022-12-16 (×2): qty 2

## 2022-12-16 MED ORDER — SENNOSIDES-DOCUSATE SODIUM 8.6-50 MG PO TABS
2.0000 | ORAL_TABLET | Freq: Every day | ORAL | Status: DC
  Administered 2022-12-16 – 2022-12-18 (×3): 2 via ORAL
  Filled 2022-12-16 (×3): qty 2

## 2022-12-16 NOTE — Progress Notes (Signed)
Mobility Specialist Progress Note    12/16/22 0944  Mobility  Activity Ambulated with assistance in hallway  Level of Assistance Contact guard assist, steadying assist  Assistive Device Front wheel walker  Distance Ambulated (ft) 125 ft  Activity Response Tolerated fair  Mobility Referral Yes  $Mobility charge 1 Mobility  Mobility Specialist Start Time (ACUTE ONLY) 0933  Mobility Specialist Stop Time (ACUTE ONLY) 0944  Mobility Specialist Time Calculation (min) (ACUTE ONLY) 11 min   During Mobility: 102 HR Post-Mobility: 100 HR  Pt received in doorway. C/o increased pain with coughing. Pt took multiple short standing rest breaks. SpO2 pleth signal unreliable. Pt expressing lots of concern about managing activity with the AD safely at home. Returned to bathroom. Pt stated he will sit up in chair to recharge once he is done. RN notified.   [place] Mobility Specialist  Please Neurosurgeon or Rehab Office at [phone]

## 2022-12-16 NOTE — Progress Notes (Signed)
Progress Note   Patient: Jeremiah Dixon ZOX:[id number] DOB: 1960/07/19 DOA: 12/06/2022     10 DOS: the patient was seen and examined on 12/16/2022   Brief hospital course: Mr. Harlin was admitted to the hospital with the working diagnosis of cardiac arrest, due to heart failure exacerbation.   62 yo male with the past medical history of coronary artery disease, hypertension, dyslipidemia and obesity who presented after having a cardiac arrest. Patient called EMS due to chest pain and dyspnea. He was found with 02 saturation of 60%, he was placed on non re-breather mask and was transported to the ED. At his arrival he developed a PEA cardiac arrest.  After 20 minutes of ACLS he recovered spontaneous circulation, required intubation and mechanical ventilation, admitted to the intensive care unit. At the time of his admission to the ICU his blood pressure was 109/29, HR 130, RR 21.  Sedation with propofol, lungs with decreased breath sounds bilaterally, heart with S1 and S2 present, irregularly irregular, abdomen with no distention and positive lower extremity edema.   In the ED patient received TNK for possible pulmonary embolism, that later was ruled out.   Chest radiograph with cardiomegaly with diffuse bilateral interstitial infiltrates. No effusions.   EKG 115 bpm, normal axis, normal intervals, atrial fibrillation rhythm with ST depression V4 to V6, negative T wave lead II, III, AvF.   CT chest with left chest wall injury with multiple fractures of the anterolateral left 3rd-7th ribs, displaced left 6th and 7th costochondral fractures and chest wall hematoma.  Non displaced fracture of the mid sternum.  Small left pleural effusion with no pneumothorax. Left lower lobe ground glass opacities, likely due to aspiration or contusion.  Indeterminate right adrenal gland nodule measuring 11 mm. (Non urgent follow up recommended).   Echocardiogram with reduced LV systolic function.    Diagnosed with cardiogenic shock and was placed on vasopressor and inotropic therapy.  Dobutamine, epinephrine, vasopressin and norepinephrine, milrinone.   11/8 Decrease vent support as able, weaning vasoactive meds  11/9 Extubated. Getting diuresis added metolazone to lasix  11/10 Off all inotropic and pressor support. Back into AF w/ RVR so amio increased. Started Dig 11/11 Still in AF w/ RVR. L/RHC Findings: Prox ->mid RCA 100% stenosed, Prox RCA 60%, Ost LAD 70%, Mid LM to distal LM 70%, Ost Cx 100% stenosed, Mid LAD 95%, Prox LAD to mid LAD 50%, 2nd diag 40% 11/12 awaiting CVTS eval. SLP doing cognitive eval  11/14, AHF discussed with TCTS and plan is for CABG first week of December  11/15 transfer to [facility]. DC cardioversion converting to sinus rhythm.  11/16 discontinue foley and central line. Transitioning to oral amiodarone.  11/18 continue to encourage mobility, follow up with CIR.   Assessment and Plan: Acute clinical systolic heart failure (HCC) Recovered cardiogenic shock sp cardiac arrest PEA.  Echocardiogram with reduced LV systolic function EF 35 to 40%, global hypokinesis, septal flattening, LV with mild dilatated internal cavity, RV systolic function with mild reduction, RV size with mild enlargement, RA with mild dilatation.   His urine output 1,200 ml Systolic blood pressure is 110 to 118 mmHg.   Medical therapy with digoxin, metoprolol, losartan and spironolactone.  Loop diuretic with torsemide.   Acute hypoxemic respiratory failure due to acute cardiogenic pulmonary edema.  Left pleural effusion. Left lower lobe aspiration pneumonia present on admission.  Rib fractures.  Completed antibiotic therapy with Unasyn.  Today 02 saturation today is 92% on room air.  Will increase oxycodone to 10 mg as needed every 4 hrs and add IV morphine for severe pain.  Continue with scheduled acetaminophen.   Coronary artery disease 11/11 coronary angiography with severe 3  vessel heavily calcified coronary artery disease with CTO of Ostial LCx and mid RCA. Mid LAD 95%.   Plan for coronary artery bypass grafting when patient more stable.  Continue aspirin and rosuvastatin.    Atrial fibrillation (HCC) 11/15 TEE/ direct current cardioversion, returning to sinus rhythm.   Heart rate is well controlled.  Patient had IV amiodarone load, now on oral amiodarone 400 mg po bid.  Anticoagulation with apixaban.   Essential hypertension Continue blood pressure control with metoprolol and losartan.  Diuresis with torsemide and spironolactone.   Hyponatremia Renal function with serum cr at 0,87 with K at 3,9 and serum bicarbonate at 25.  Na 127  Mg 2,0   Plan to continue torsemide and spironolactone.  Follow up renal function and electrolytes in am.   Chronic anemia Cell count has been stable.  Hgb 9.2  Iron panel with serum iron 109, TIBC 249, transferrin saturation 44 and ferritin 690, consistent with anemia of chronic disease with no iron deficiency.    Pre-diabetes Fasting glucose today is 127 mg/dl.   Now off insulin, checking CBG as needed.   Obesity, class 2 Calculated BMI is 36.4      Subjective: Patient is feeling well, he has been experiencing chest pain related to his rib fractures, improved with analgesics.   Physical Exam: Vitals:   12/15/22 2316 12/16/22 0343 12/16/22 0621 12/16/22 0805  BP: 110/67 116/67  118/77  Pulse:    76  Resp: 15 (!) 22 13 17   Temp: 98 F (36.7 C) 98.1 F (36.7 C)  98.7 F (37.1 C)  TempSrc: Oral Oral  Oral  SpO2:  94%  92%  Weight:   126.6 kg   Height:       Neurology awake and alert ENT with mild pallor Cardiovascular with S1 and S2 present and rhythmic with no gallops, rubs or murmurs No JVD No lower extremity edema (unna boots in place) Respiratory with no rales or wheezing, no rhonchi Abdomen with no distention  Data Reviewed:    Family Communication: no family at the bedside    Disposition: Status is: Inpatient Remains inpatient appropriate because: pending transfer to possible CIR   Planned Discharge Destination: rehab.     Author: Coralie Keens, MD 12/16/2022 10:14 AM  For on call review [email or web address].

## 2022-12-16 NOTE — Progress Notes (Signed)
   Rounding Note    Patient Name: Jeremiah Dixon Date of Encounter: 12/16/2022  [facility] Cardiologist: None   Subjective   NAEO. Some chest wall pain remains.  Vital Signs    Vitals:   12/15/22 2316 12/16/22 0343 12/16/22 0621 12/16/22 0805  BP: 110/67 116/67  118/77  Pulse:    76  Resp: 15 (!) 22 13 17   Temp: 98 F (36.7 C) 98.1 F (36.7 C)  98.7 F (37.1 C)  TempSrc: Oral Oral  Oral  SpO2:  94%  92%  Weight:   126.6 kg   Height:        Intake/Output Summary (Last 24 hours) at 12/16/2022 0841 Last data filed at 12/16/2022 2130 Gross per 24 hour  Intake 480 ml  Output 1200 ml  Net -720 ml      12/16/2022    6:21 AM 12/15/2022    5:00 AM 12/14/2022    5:03 AM  Last 3 Weights  Weight (lbs) 279 lb 3.2 oz 279 lb 1.6 oz 283 lb 8.2 oz  Weight (kg) 126.644 kg 126.6 kg 128.6 kg        Physical Exam   GEN: Obese. Uncomfortable appearing due to chest wall pain. Cardiac: RRR, no murmurs, rubs, or gallops.  Respiratory: Clear to auscultation bilaterally. Psych: Normal affect   Assessment & Plan    #Acute systolic heart failure/shock Shock resolved. S/p PEA arrest 11/7. EF now 40-45%. Cont dig, spironoloactone, losartan, metoprolol Wait for entresto until post CABG  #CAD CABG planned Cont asa/statin  #AF Cont eliquis and dig Remains in sinus  #Deconditioning Planning for CIR prior to CABG    Sheria Lang T. Lalla Brothers, MD, [facility], [facility] Cardiac Electrophysiology

## 2022-12-17 ENCOUNTER — Inpatient Hospital Stay (HOSPITAL_COMMUNITY): Payer: Managed Care, Other (non HMO)

## 2022-12-17 DIAGNOSIS — R570 Cardiogenic shock: Secondary | ICD-10-CM | POA: Diagnosis not present

## 2022-12-17 DIAGNOSIS — I5021 Acute systolic (congestive) heart failure: Secondary | ICD-10-CM | POA: Diagnosis not present

## 2022-12-17 DIAGNOSIS — J810 Acute pulmonary edema: Secondary | ICD-10-CM | POA: Diagnosis not present

## 2022-12-17 LAB — BASIC METABOLIC PANEL
Anion gap: 10 (ref 5–15)
BUN: 18 mg/dL (ref 8–23)
CO2: 27 mmol/L (ref 22–32)
Calcium: 8.8 mg/dL — ABNORMAL LOW (ref 8.9–10.3)
Chloride: 92 mmol/L — ABNORMAL LOW (ref 98–111)
Creatinine, Ser: 0.98 mg/dL (ref 0.61–1.24)
GFR, Estimated: >60 mL/min (ref >60)
Glucose, Bld: 117 mg/dL — ABNORMAL HIGH (ref 70–99)
Potassium: 3.9 mmol/L (ref 3.5–5.1)
Sodium: 129 mmol/L — ABNORMAL LOW (ref 135–145)

## 2022-12-17 LAB — GLUCOSE, CAPILLARY
Glucose-Capillary: 130 mg/dL — ABNORMAL HIGH (ref 70–99)
Glucose-Capillary: 149 mg/dL — ABNORMAL HIGH (ref 70–99)
Glucose-Capillary: 104 mg/dL — ABNORMAL HIGH (ref 70–99)

## 2022-12-17 LAB — CBC
HCT: 29.2 % — ABNORMAL LOW (ref 39.0–52.0)
Hemoglobin: 9.7 g/dL — ABNORMAL LOW (ref 13.0–17.0)
MCH: 30.9 pg (ref 26.0–34.0)
MCHC: 33.2 g/dL (ref 30.0–36.0)
MCV: 93 fL (ref 80.0–100.0)
Platelets: 406 10*3/uL — ABNORMAL HIGH (ref 150–400)
RBC: 3.14 MIL/uL — ABNORMAL LOW (ref 4.22–5.81)
RDW: 13.2 % (ref 11.5–15.5)
WBC: 6.2 10*3/uL (ref 4.0–10.5)
nRBC: 0 % (ref 0.0–0.2)

## 2022-12-17 LAB — MAGNESIUM: Magnesium: 2 mg/dL (ref 1.7–2.4)

## 2022-12-17 MED ORDER — LIDOCAINE 5 % EX PTCH
1.0000 | MEDICATED_PATCH | CUTANEOUS | Status: DC
  Administered 2022-12-17: 1 via TRANSDERMAL
  Filled 2022-12-17 (×2): qty 1

## 2022-12-17 MED ORDER — OXYCODONE HCL 5 MG PO TABS
5.0000 mg | ORAL_TABLET | ORAL | Status: DC | PRN
  Administered 2022-12-17 – 2022-12-19 (×6): 5 mg via ORAL
  Filled 2022-12-17 (×7): qty 1

## 2022-12-17 NOTE — Progress Notes (Signed)
Mobility Specialist Progress Note:   12/17/22 0900  Mobility  Activity Ambulated with assistance in hallway  Level of Assistance Contact guard assist, steadying assist  Assistive Device Front wheel walker  Distance Ambulated (ft) 100 ft  RUE Weight Bearing NWB  Activity Response Tolerated well  Mobility Referral Yes  $Mobility charge 1 Mobility  Mobility Specialist Start Time (ACUTE ONLY) 0919  Mobility Specialist Stop Time (ACUTE ONLY) [id number]  Mobility Specialist Time Calculation (min) (ACUTE ONLY) 9 min    Pre Mobility: 76 HR During Mobility: 96 HR Post Mobility:  88 HR  Pt received in chair, agreeable to mobility. C/o ambulating shorter distances recently d/t SOB and rib pain. Asymptomatic throughout session. Pt left in chair with call bell and all needs met.  [place] Earlene Plater Mobility Specialist Please contact via Special educational needs teacher or Rehab office at [phone]

## 2022-12-17 NOTE — Progress Notes (Signed)
Seen pt to assist with amb, pt declined. Pt c/p of SOB and rib pain while walking and would like to eat before walking again. Will f/u if possible.   Jeremiah Dixon 12/17/2022 11:12 AM

## 2022-12-17 NOTE — Progress Notes (Signed)
PROGRESS NOTE    Bige Barrientos  RUE:[id number] DOB: [birth year] DOA: 12/06/2022 PCP: Sandre Kitty, PA-C  62/M with history of CAD, hypertension presented with chest pain to the ED, had a PEA arrest in the emergency room, ROSC 25 minutes, given TN K for presumed PE, subsequently diagnosed with cardiogenic shock and acute systolic CHF. -Echo noted EF of 35% with dilated RV, extubated 11/8, off pressors -Diuresed well, followed by heart failure team, Noted severe three-vessel CAD, CABG recommended in early December per Dr. Laneta Simmers. -Now plan for CIR   Subjective: -Feels okay, had episode of chest pain overnight for the last 2 nights, denies dyspnea  Assessment and Plan:  Acute systolic CHF/cardiogenic shock SP PEA arrest 11/7 -Echo noted EF of 35-40%, global hypokinesis, mildly reduced RV with RV dilation  -RHC/LHC noted severe three-vessel disease, mild to moderate PAH, normal output -Followed by heart failure team, stable off pressors, diuresed aggressively, 34.5 L negative  -Now appears euvolemic, continue digoxin, Aldactone, metoprolol, losartan -Continue torsemide 40 Mg daily -Plan for SGLT2i and Entresto post CABG  Multivessel CAD -LHC 11/11 noted severe three-vessel disease -Continue aspirin, statin, metoprolol, plan for CABG on 12/2 by Dr. Laneta Simmers  Acute hypoxemic respiratory failure  Rib fractures from CPR -due to acute cardiogenic pulmonary edema.  -?Left lower lobe aspiration pneumonia present on admission.  Rib fractures.,  Pain control, incentive spirometry -Completed course of Unasyn  Atrial fibrillation (HCC) with RVR 11/15 TEE/ direct current cardioversion, returning to sinus rhythm.  -Now on oral amiodarone, digoxin and apixaban  Hyponatremia -Improved  Chronic anemia Hgb 9.2  Iron panel with serum iron 109, TIBC 249, transferrin saturation 44 and ferritin 690, consistent with anemia of chronic disease with no iron deficiency.   History of EtOH  abuse -No withdrawal noted  Pre-diabetes  Obesity, class 2 Calculated BMI is 36.4  Deconditioning -CIR evaluating for short-term rehab  DVT prophylaxis: Eliquis Code Status: Full code Family Communication: None present Disposition Plan: CIR when bed available  Consultants:    Procedures:   Antimicrobials:    Objective: Vitals:   12/17/22 0639 12/17/22 0756 12/17/22 1015 12/17/22 1143  BP:  115/69 104/80 139/84  Pulse:  79 80 78  Resp:    16  Temp:  98.6 F (37 C)  98.2 F (36.8 C)  TempSrc:  Oral  Oral  SpO2:      Weight: 125.9 kg     Height:        Intake/Output Summary (Last 24 hours) at 12/17/2022 1159 Last data filed at 12/17/2022 0700 Gross per 24 hour  Intake 1560 ml  Output 725 ml  Net 835 ml   Filed Weights   12/15/22 0500 12/16/22 0621 12/17/22 0639  Weight: 126.6 kg 126.6 kg 125.9 kg    Examination:  General exam: Appears calm and comfortable  Respiratory system: Clear to auscultation Cardiovascular system: S1 & S2 heard, RRR.  Abd: nondistended, soft and nontender.Normal bowel sounds heard. Central nervous system: Alert and oriented. No focal neurological deficits. Extremities: no edema Skin: No rashes Psychiatry:  Mood & affect appropriate.     Data Reviewed:   CBC: Recent Labs  Lab 12/12/22 0420 12/13/22 0423 12/14/22 0452 12/15/22 0550 12/17/22 0302  WBC 8.0 7.9 6.3 6.5 6.2  HGB 9.1* 9.1* 9.2* 9.6* 9.7*  HCT 26.8* 27.6* 27.8* 28.9* 29.2*  MCV 94.7 94.8 94.9 94.8 93.0  PLT 234 284 297 352 406*   Basic Metabolic Panel: Recent Labs  Lab 12/13/22 0423 12/13/22  1711 12/14/22 0452 12/15/22 0550 12/16/22 0231 12/17/22 0302  NA 126* 129* 131* 130* 127* 129*  K 3.6 4.5 4.1 3.8 3.9 3.9  CL 90* 93* 95* 92* 90* 92*  CO2 27 27 26 29 25 27   GLUCOSE 96 152* 105* 96 127* 117*  BUN 11 16 15 15 19 18   CREATININE 1.18 1.14 0.92 0.95 0.87 0.98  CALCIUM 8.5* 8.8* 8.8* 9.0 9.0 8.8*  MG 2.0  --  2.0 1.9 2.0 2.0    GFR: Estimated Creatinine Clearance: 110.2 mL/min (by C-G formula based on SCr of 0.98 mg/dL). Liver Function Tests: Recent Labs  Lab 12/11/22 1849  PROT 3.7*   No results for input(s): "LIPASE", "AMYLASE" in the last 168 hours. No results for input(s): "AMMONIA" in the last 168 hours. Coagulation Profile: No results for input(s): "INR", "PROTIME" in the last 168 hours. Cardiac Enzymes: No results for input(s): "CKTOTAL", "CKMB", "CKMBINDEX", "TROPONINI" in the last 168 hours. BNP (last 3 results) No results for input(s): "PROBNP" in the last 8760 hours. HbA1C: No results for input(s): "HGBA1C" in the last 72 hours. CBG: Recent Labs  Lab 12/16/22 0346 12/16/22 0802 12/16/22 1202 12/16/22 1627 12/17/22 0801  GLUCAP 128* 98 95 92 149*   Lipid Profile: No results for input(s): "CHOL", "HDL", "LDLCALC", "TRIG", "CHOLHDL", "LDLDIRECT" in the last 72 hours. Thyroid Function Tests: No results for input(s): "TSH", "T4TOTAL", "FREET4", "T3FREE", "THYROIDAB" in the last 72 hours. Anemia Panel: No results for input(s): "VITAMINB12", "FOLATE", "FERRITIN", "TIBC", "IRON", "RETICCTPCT" in the last 72 hours. Urine analysis:    Component Value Date/Time   COLORURINE STRAW (A) 12/06/2022 1107   APPEARANCEUR CLEAR 12/06/2022 1107   LABSPEC 1.005 12/06/2022 1107   PHURINE 5.0 12/06/2022 1107   GLUCOSEU NEGATIVE 12/06/2022 1107   HGBUR MODERATE (A) 12/06/2022 1107   BILIRUBINUR NEGATIVE 12/06/2022 1107   KETONESUR NEGATIVE 12/06/2022 1107   PROTEINUR NEGATIVE 12/06/2022 1107   NITRITE NEGATIVE 12/06/2022 1107   LEUKOCYTESUR NEGATIVE 12/06/2022 1107   Sepsis Labs: @LABRCNTIP (procalcitonin:4,lacticidven:4)  ) Recent Results (from the past 240 hour(s))  Body fluid culture w Gram Stain     Status: None   Collection Time: 12/11/22  6:49 PM   Specimen: Pleural Fluid  Result Value Ref Range Status   Specimen Description FLUID PLEURAL  Final   Special Requests NONE  Final   Gram  Stain NO WBC SEEN NO ORGANISMS SEEN   Final   Culture   Final    NO GROWTH 3 DAYS Performed at [facility], [address] [place], [place] [address]    Report Status 12/15/2022 FINAL  Final     Radiology Studies: No results found.   Scheduled Meds:  acetaminophen  650 mg Oral Q6H   amiodarone  400 mg Oral BID   apixaban  5 mg Oral BID   aspirin  81 mg Oral Daily   chlorpheniramine-HYDROcodone  5 mL Oral Q12H   digoxin  0.125 mg Oral Daily   famotidine  20 mg Oral BID   feeding supplement  237 mL Oral TID BM   lidocaine  1 patch Transdermal Q24H   losartan  25 mg Oral Daily   metoprolol succinate  50 mg Oral Daily   polyethylene glycol  17 g Oral Daily   psyllium  1 packet Oral Daily   rosuvastatin  20 mg Oral Daily   senna-docusate  2 tablet Oral QHS   sodium chloride flush  10 mL Intracatheter Q12H   spironolactone  25 mg Oral Daily   torsemide  40 mg Oral Daily   Continuous Infusions:   LOS: 11 days    Time spent:    Zannie Cove, MD [facility]   12/17/2022, 11:59 AM

## 2022-12-17 NOTE — Plan of Care (Signed)
  Problem: Coping: Goal: Ability to adjust to condition or change in health will improve Outcome: Progressing   Problem: Fluid Volume: Goal: Ability to maintain a balanced intake and output will improve Outcome: Progressing   Problem: Health Behavior/Discharge Planning: Goal: Ability to manage health-related needs will improve Outcome: Progressing   Problem: Metabolic: Goal: Ability to maintain appropriate glucose levels will improve Outcome: Progressing   Problem: Nutritional: Goal: Maintenance of adequate nutrition will improve Outcome: Progressing   Problem: Skin Integrity: Goal: Risk for impaired skin integrity will decrease Outcome: Progressing   Problem: Education: Goal: Knowledge of General Education information will improve Description: Including pain rating scale, medication(s)/side effects and non-pharmacologic comfort measures Outcome: Progressing   Problem: Clinical Measurements: Goal: Will remain free from infection Outcome: Progressing Goal: Diagnostic test results will improve Outcome: Progressing Goal: Respiratory complications will improve Outcome: Progressing Goal: Cardiovascular complication will be avoided Outcome: Progressing   Problem: Safety: Goal: Ability to remain free from injury will improve Outcome: Progressing   Problem: Skin Integrity: Goal: Risk for impaired skin integrity will decrease Outcome: Progressing

## 2022-12-17 NOTE — Progress Notes (Signed)
Occupational Therapy Treatment Patient Details Name: Jeremiah Dixon MRN: [id number] DOB: 03/03/1960 Today's Date: 12/17/2022   History of present illness Pt is a 62 y/o male presenting to the ED on 11/7 with respiratory arrest, hypoxia. In ED became unresponsive, pulseless and CPR 25 min. Intubated 11/7-11/8.  TNK given for PE concern. 11/11 radial access heart cath. 11/15 dCCV. PMHx  CAD, HTN   OT comments  Pt with good progress toward established OT goals. Performing functional mobility, toileting, and hand hygiene with up to CGA. Pt educated regarding energy conservation strategies and handout provided. Good awareness of current abilities and how to implement energy conservation. Updated discharge recommendation to HHOT.       If plan is discharge home, recommend the following:  A little help with walking and/or transfers;A little help with bathing/dressing/bathroom;Assistance with cooking/housework;Assist for transportation;Help with stairs or ramp for entrance;Supervision due to cognitive status   Equipment Recommendations  Tub/shower seat;Other (comment) (RW)    Recommendations for Other Services      Precautions / Restrictions Precautions Precautions: Fall Restrictions RUE Weight Bearing: Non weight bearing       Mobility Bed Mobility               General bed mobility comments: received in recliner and in recliner on departure    Transfers Overall transfer level: Needs assistance Equipment used: Rolling walker (2 wheels) Transfers: Sit to/from Stand Sit to Stand: Supervision           General transfer comment: verbal cues not to pull up on walker and to place hands on arm rests or on knees     Balance Overall balance assessment: Needs assistance Sitting-balance support: No upper extremity supported, Feet supported Sitting balance-Leahy Scale: Fair     Standing balance support: Bilateral upper extremity supported, During functional activity,  Reliant on assistive device for balance Standing balance-Leahy Scale: Fair                             ADL either performed or assessed with clinical judgement   ADL Overall ADL's : Needs assistance/impaired Eating/Feeding: Modified independent Eating/Feeding Details (indicate cue type and reason): seated in recliner at end of session Grooming: Contact guard assist;Wash/dry hands;Standing Grooming Details (indicate cue type and reason): CGA for safety             Lower Body Dressing: Set up;Cueing for compensatory techniques;Sitting/lateral leans Lower Body Dressing Details (indicate cue type and reason): pt. able to perform lb dressing (socks) reviewed compensatory strategies for energy conservation and safety.             Functional mobility during ADLs: Contact guard assist;Rolling walker (2 wheels)      Extremity/Trunk Assessment Upper Extremity Assessment Upper Extremity Assessment: Generalized weakness   Lower Extremity Assessment Lower Extremity Assessment: Defer to PT evaluation        Vision   Vision Assessment?: No apparent visual deficits   Perception Perception Perception: Not tested   Praxis Praxis Praxis: Not tested    Cognition Arousal: Alert Behavior During Therapy: WFL for tasks assessed/performed Overall Cognitive Status: No family/caregiver present to determine baseline cognitive functioning Area of Impairment: Safety/judgement, Memory, Awareness, Problem solving                     Memory: Decreased short-term memory Following Commands: Follows one step commands consistently Safety/Judgement: Decreased awareness of deficits Awareness: Emergent Problem Solving: Slow processing  Exercises      Shoulder Instructions       General Comments      Pertinent Vitals/ Pain       Pain Assessment Pain Assessment: Faces Faces Pain Scale: Hurts little more Pain Location: chest with cough Pain Descriptors /  Indicators: Sore Pain Intervention(s): Limited activity within patient's tolerance, Monitored during session  Home Living                                          Prior Functioning/Environment              Frequency  Min 1X/week        Progress Toward Goals  OT Goals(current goals can now be found in the care plan section)     Acute Rehab OT Goals Patient Stated Goal: get better OT Goal Formulation: With patient Time For Goal Achievement: 12/23/22 Potential to Achieve Goals: Good  Plan      Co-evaluation                 AM-PAC OT "6 Clicks" Daily Activity     Outcome Measure                    End of Session Equipment Utilized During Treatment: Gait belt;Rolling walker (2 wheels)  OT Visit Diagnosis: Unsteadiness on feet (R26.81);Muscle weakness (generalized) (M62.81);Other symptoms and signs involving cognitive function;Other abnormalities of gait and mobility (R26.89)   Activity Tolerance Patient tolerated treatment well   Patient Left in chair;with call bell/phone within reach   Nurse Communication Mobility status        Time: 1128-1150 OT Time Calculation (min): 22 min  Charges: OT General Charges $OT Visit: 1 Visit OT Treatments $Self Care/Home Management : 8-22 mins  Tyler Deis, OTR/L [facility] Acute Rehabilitation Office: [phone]   Myrla Halsted 12/17/2022, 12:01 PM

## 2022-12-17 NOTE — Progress Notes (Signed)
Inpatient Rehab Coordinator Note:  I spoke with patient over the phone to discuss CIR recommendations and goals/expectations of CIR stay.  We reviewed 3 hrs/day of therapy, physician follow up, and average length of stay 2 weeks (dependent upon progress) with goals of supervision to mod I.  He is agreeable to CIR stay for "prehab" .  He notes he lives alone.  We reviewed insurance auth process.  I will start that today.    Estill Dooms, PT, DPT Admissions Coordinator [phone] 12/17/22  3:00 PM

## 2022-12-17 NOTE — Progress Notes (Signed)
Patient ID: Jeremiah Dixon, male   DOB: 11-12-1960, 62 y.o.   MRN: [id number]     Advanced Heart Failure Rounding Note  PCP-Cardiologist: None   Subjective:    Had DC-CV 11/15. Remains in NSR   Had 8/10 rib pain after getting up to the bathroom around 2 am today.  Resolved with oxy.  Feels better but still having to take breaks with ambulation. Unable to complete laps 2/2 SOB.   Objective:   Weight Range: 125.9 kg Body mass index is 35.64 kg/m.   Vital Signs:   Temp:  [97.9 F (36.6 C)-98.8 F (37.1 C)] 98.6 F (37 C) (11/18 0756) Pulse Rate:  [74-82] 79 (11/18 0756) Resp:  [12-19] 12 (11/18 0317) BP: (102-129)/(64-79) 115/69 (11/18 0756) SpO2:  [95 %-98 %] 98 % (11/18 0317) Weight:  [125.9 kg] 125.9 kg (11/18 0639) Last BM Date : 12/16/22  Weight change: Filed Weights   12/15/22 0500 12/16/22 0621 12/17/22 0639  Weight: 126.6 kg 126.6 kg 125.9 kg    Intake/Output:   Intake/Output Summary (Last 24 hours) at 12/17/2022 0823 Last data filed at 12/17/2022 0700 Gross per 24 hour  Intake 1800 ml  Output 725 ml  Net 1075 ml   Physical Exam   General:  well appearing.  No respiratory difficulty HEENT: normal Neck: supple. JVD ~8 cm. Carotids 2+ bilat; no bruits. No lymphadenopathy or thyromegaly appreciated. Cor: PMI nondisplaced. Regular rate & rhythm. No rubs, gallops or murmurs. Lungs: clear Abdomen: soft, nontender, nondistended. No hepatosplenomegaly. No bruits or masses. Good bowel sounds. Extremities: no cyanosis, clubbing, rash, edema. + UNNA boots Neuro: alert & oriented x 3, cranial nerves grossly intact. moves all 4 extremities w/o difficulty. Affect pleasant.   Telemetry   NSR 80s (Personally reviewed)    Labs    CBC Recent Labs    12/15/22 0550 12/17/22 0302  WBC 6.5 6.2  HGB 9.6* 9.7*  HCT 28.9* 29.2*  MCV 94.8 93.0  PLT 352 406*   Basic Metabolic Panel Recent Labs    40/98/11 0231 12/17/22 0302  NA 127* 129*  K 3.9 3.9  CL  90* 92*  CO2 25 27  GLUCOSE 127* 117*  BUN 19 18  CREATININE 0.87 0.98  CALCIUM 9.0 8.8*  MG 2.0 2.0   Liver Function Tests No results for input(s): "AST", "ALT", "ALKPHOS", "BILITOT", "PROT", "ALBUMIN" in the last 72 hours.  No results for input(s): "LIPASE", "AMYLASE" in the last 72 hours. Cardiac Enzymes No results for input(s): "CKTOTAL", "CKMB", "CKMBINDEX", "TROPONINI" in the last 72 hours.  BNP: BNP (last 3 results) Recent Labs    12/06/22 0551  BNP 402.8*    ProBNP (last 3 results) No results for input(s): "PROBNP" in the last 8760 hours.   D-Dimer No results for input(s): "DDIMER" in the last 72 hours. Hemoglobin A1C No results for input(s): "HGBA1C" in the last 72 hours.  Fasting Lipid Panel No results for input(s): "CHOL", "HDL", "LDLCALC", "TRIG", "CHOLHDL", "LDLDIRECT" in the last 72 hours.  Thyroid Function Tests No results for input(s): "TSH", "T4TOTAL", "T3FREE", "THYROIDAB" in the last 72 hours.  Invalid input(s): "FREET3"  Other results:  Imaging   No results found.  Medications:    Scheduled Medications:  acetaminophen  650 mg Oral Q6H   amiodarone  400 mg Oral BID   apixaban  5 mg Oral BID   aspirin  81 mg Oral Daily   chlorpheniramine-HYDROcodone  5 mL Oral Q12H   digoxin  0.125 mg Oral  Daily   famotidine  20 mg Oral BID   feeding supplement  237 mL Oral TID BM   lidocaine  1 patch Transdermal Q24H   losartan  25 mg Oral Daily   metoprolol succinate  50 mg Oral Daily   polyethylene glycol  17 g Oral Daily   psyllium  1 packet Oral Daily   rosuvastatin  20 mg Oral Daily   senna-docusate  2 tablet Oral QHS   sodium chloride flush  10 mL Intracatheter Q12H   spironolactone  25 mg Oral Daily   torsemide  40 mg Oral Daily    Infusions:    PRN Medications: bisacodyl, guaiFENesin, morphine injection, ondansetron (ZOFRAN) IV, mouth rinse, oxyCODONE, traZODone  Patient Profile   Jeremiah Dixon is a 62 year old male with a history  of CAD, HTN,  and HLD.    Presented w/ CP and had PEA arrest in the ED. ROSC ~ 25 minutes. TNK given for presumed PE. AHF team consulted for cardiogenic shock.   Assessment/Plan  1. Acute systolic CHF/Cardiogenic shock: Shock now resolved.  S/p PEA arrest 11/7.  Received tenecteplase in ER given concern for massive PE in setting of PEA arrest but could this all be CHF with new AF/RVR?  HS-TnI rose to 2815 post- arrest.  Bedside echo (somewhat difficult study) with LV EF difficult to quantify but looks in 35% range, RV moderately dilated/moderately dysfunctional with D-shaped septum, IVC dilated.  Extubated on 11/8 and now off all pressors. Diuresed well w/ IV Lasix. 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 Repeat Echo with EF 40-45%, RV nl. - TEE 11/15 EF 35-40% Co-ox 65% - Volume status looks good. Continue Torsemide 40mg  PO. - Continue digoxin 0.125 - Continue Spironolactone 25 mg daily  - Continue losartan 25 mg daily.  - Continue metoprolol succinate 50 daily - Would wait to transition to [facility] and start SGLT2i until post CABG   2. CAD: Known occlusion of OM1 in 2007.  HS-TnI 397 => 2815 post arrest, no STEMI on ECG.  Has received tenecteplase.  - LHC this admit severe 3v heavily calcified CAD with CTO of Ostial LCx and midRCA. Mid LAD 95%  - CTS consulted. Will proceed with CABG per Dr. Laneta Simmers first week of Dec. (12/2)  - Continue ASA/statin.   3. 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  Has had tenecteplase.  Lower extremity venous dopplers with no DVT.  Will be hard to definitively confirm PE diagnosis at this point, but will get long-term anticoagulation due to AF.  - doubt PE Suspect initial presentation was HF/AF  4. Atrial fibrillation: With RVR on admission, no prior history.  Converted to NSR on amiodarone initially but back in AF with RVR on 11/9.  - s/p  TEE/DC-CV on 11/15.  - Remains in NSR - Continue amio 400 bid - Continue Eliquis 5mg  BID - Continue digoxin as above  5. Acute hypoxemic respiratory failure: Extubated.  Stable on [place] for comfort - L pleural effusion noted on CXR, drained by CCM and repeat CXR with residual effusion noted - Resolved  6. ETOH abuse: Per brother, "1/2 gallon beer" daily.   7. Hyponatremia: Hypervolemic hyponatremia. Na improved w/ tolvaptan  - 129 today  8. Severe deconditioning - Lives in 2nd floor apt. Unable to get up steps.  - Hopefully can be discharged to CIR  for prehab prior to CABG  Length of Stay: 11  Alen Bleacher, NP  12/17/2022, 8:23 AM  Advanced Heart Failure Team Pager [phone] (M-F; 7a - 5p)  Please contact [facility] for night-coverage after hours (5p -7a ) and weekends on [email or web address]

## 2022-12-17 NOTE — Progress Notes (Signed)
Orthopedic Tech Progress Note Patient Details:  Jeremiah Dixon 14-Dec-1960 [id number]  Ortho Devices Type of Ortho Device: Radio broadcast assistant Ortho Device/Splint Location: bi lateral Ortho Device/Splint Interventions: Ordered, Application, Adjustment   Post Interventions Patient Tolerated: Well Instructions Provided: Care of device, Adjustment of device  Trinna Post 12/17/2022, 7:47 PM

## 2022-12-18 DIAGNOSIS — I5021 Acute systolic (congestive) heart failure: Secondary | ICD-10-CM | POA: Diagnosis not present

## 2022-12-18 LAB — BASIC METABOLIC PANEL
Anion gap: 7 (ref 5–15)
BUN: 17 mg/dL (ref 8–23)
CO2: 30 mmol/L (ref 22–32)
Calcium: 9 mg/dL (ref 8.9–10.3)
Chloride: 93 mmol/L — ABNORMAL LOW (ref 98–111)
Creatinine, Ser: 0.98 mg/dL (ref 0.61–1.24)
GFR, Estimated: >60 mL/min (ref >60)
Glucose, Bld: 114 mg/dL — ABNORMAL HIGH (ref 70–99)
Potassium: 4.3 mmol/L (ref 3.5–5.1)
Sodium: 130 mmol/L — ABNORMAL LOW (ref 135–145)

## 2022-12-18 LAB — GLUCOSE, CAPILLARY
Glucose-Capillary: 127 mg/dL — ABNORMAL HIGH (ref 70–99)
Glucose-Capillary: 100 mg/dL — ABNORMAL HIGH (ref 70–99)

## 2022-12-18 MED ORDER — GUAIFENESIN ER 600 MG PO TB12
600.0000 mg | ORAL_TABLET | Freq: Two times a day (BID) | ORAL | Status: DC | PRN
  Administered 2022-12-18: 600 mg via ORAL
  Filled 2022-12-18: qty 1

## 2022-12-18 MED ORDER — HYDROCOD POLI-CHLORPHE POLI ER 10-8 MG/5ML PO SUER
5.0000 mL | Freq: Two times a day (BID) | ORAL | Status: DC | PRN
  Administered 2022-12-18: 5 mL via ORAL
  Filled 2022-12-18: qty 5

## 2022-12-18 NOTE — Progress Notes (Signed)
PROGRESS NOTE    Keatan Vandevoort  ZOX:[id number] DOB: 05/25/1960 DOA: 12/06/2022 PCP: Sandre Kitty, PA-C  62/M with history of CAD, hypertension presented with chest pain to the ED, had a PEA arrest in the emergency room, ROSC 25 minutes, given TN K for presumed PE, subsequently diagnosed with cardiogenic shock and acute systolic CHF. -Echo noted EF of 35% with dilated RV, extubated 11/8, off pressors -Diuresed well, followed by heart failure team, cath noted severe three-vessel CAD, CABG planned in early December per Dr. Laneta Simmers. -Now plan for CIR, insurance authorization started 11/18   Subjective: -Feels okay overall, breathing improving, had 1 instance of chest pain overnight with position change  Assessment and Plan:  Acute systolic CHF/cardiogenic shock SP PEA arrest 11/7 -Echo noted EF of 35-40%, global hypokinesis, mildly reduced RV with RV dilation  -RHC/LHC noted severe three-vessel disease, mild to moderate PAH, normal output -Followed by heart failure team, stable off pressors, diuresed aggressively, 34.5 L negative  -Now euvolemic, continue digoxin, Aldactone, metoprolol, losartan -Continue torsemide 40 Mg daily -Plan for SGLT2i and Entresto post CABG  Intermittent chest pain -Pleuritic, noted with position changes, secondary to prolonged CPR and rib fractures -Overall improving, supportive care  Multivessel CAD -LHC 11/11 noted severe three-vessel disease -Continue aspirin, statin, metoprolol, plan for CABG on 12/2 by Dr. Laneta Simmers  Acute hypoxemic respiratory failure  Rib fractures from CPR -due to acute cardiogenic pulmonary edema primarily -?Left lower lobe aspiration pneumonia present on admission.  -Pain control, incentive spirometry, also completed course of Unasyn  Atrial fibrillation (HCC) with RVR 11/15 TEE/ direct current cardioversion, returning to sinus rhythm.  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  Deconditioning -CIR evaluating for short-term rehab, authorization started yesterday  DVT prophylaxis: Eliquis Code Status: Full code Family Communication: None present Disposition Plan: CIR pending authorization  Consultants:    Procedures:   Antimicrobials:    Objective: Vitals:   12/17/22 2356 12/18/22 0440 12/18/22 0520 12/18/22 0744  BP:  134/83  111/70  Pulse:  76  76  Resp: 20 15 17 15   Temp: 98.1 F (36.7 C) 98 F (36.7 C)  98.2 F (36.8 C)  TempSrc: Oral Oral  Oral  SpO2:  97%  96%  Weight:   126.3 kg   Height:        Intake/Output Summary (Last 24 hours) at 12/18/2022 1059 Last data filed at 12/18/2022 0744 Gross per 24 hour  Intake 120 ml  Output 250 ml  Net -130 ml   Filed Weights   12/16/22 0621 12/17/22 0639 12/18/22 0520  Weight: 126.6 kg 125.9 kg 126.3 kg    Examination:  Gen: Awake, Alert, Oriented X 3,  HEENT: no JVD Lungs: Good air movement bilaterally, CTAB CVS: S1S2/RRR Abd: soft, Non tender, non distended, BS present Extremities: No edema Skin: no new rashes on exposed skin     Data Reviewed:   CBC: Recent Labs  Lab 12/12/22 0420 12/13/22 0423 12/14/22 0452 12/15/22 0550 12/17/22 0302  WBC 8.0 7.9 6.3 6.5 6.2  HGB 9.1* 9.1* 9.2* 9.6* 9.7*  HCT 26.8* 27.6* 27.8* 28.9* 29.2*  MCV 94.7 94.8 94.9 94.8 93.0  PLT 234 284 297 352 406*   Basic Metabolic Panel: Recent Labs  Lab 12/13/22 0423 12/13/22 1711 12/14/22 0452 12/15/22 0550 12/16/22  0231 12/17/22 0302 12/18/22 0229  NA 126*   < > 131* 130* 127* 129* 130*  K 3.6   < > 4.1 3.8 3.9 3.9 4.3  CL 90*   < > 95* 92* 90* 92* 93*  CO2 27   < > 26 29 25 27 30   GLUCOSE 96   < > 105* 96 127* 117* 114*  BUN 11   < > 15 15 19 18 17   CREATININE 1.18   < > 0.92 0.95 0.87 0.98 0.98  CALCIUM 8.5*   < >  8.8* 9.0 9.0 8.8* 9.0  MG 2.0  --  2.0 1.9 2.0 2.0  --    < > = values in this interval not displayed.   GFR: Estimated Creatinine Clearance: 110.3 mL/min (by C-G formula based on SCr of 0.98 mg/dL). Liver Function Tests: Recent Labs  Lab 12/11/22 1849  PROT 3.7*   No results for input(s): "LIPASE", "AMYLASE" in the last 168 hours. No results for input(s): "AMMONIA" in the last 168 hours. Coagulation Profile: No results for input(s): "INR", "PROTIME" in the last 168 hours. Cardiac Enzymes: No results for input(s): "CKTOTAL", "CKMB", "CKMBINDEX", "TROPONINI" in the last 168 hours. BNP (last 3 results) No results for input(s): "PROBNP" in the last 8760 hours. HbA1C: No results for input(s): "HGBA1C" in the last 72 hours. CBG: Recent Labs  Lab 12/16/22 1202 12/16/22 1627 12/17/22 0801 12/17/22 2003 12/18/22 0752  GLUCAP 95 92 149* 104* 127*   Lipid Profile: No results for input(s): "CHOL", "HDL", "LDLCALC", "TRIG", "CHOLHDL", "LDLDIRECT" in the last 72 hours. Thyroid Function Tests: No results for input(s): "TSH", "T4TOTAL", "FREET4", "T3FREE", "THYROIDAB" in the last 72 hours. Anemia Panel: No results for input(s): "VITAMINB12", "FOLATE", "FERRITIN", "TIBC", "IRON", "RETICCTPCT" in the last 72 hours. Urine analysis:    Component Value Date/Time   COLORURINE STRAW (A) 12/06/2022 1107   APPEARANCEUR CLEAR 12/06/2022 1107   LABSPEC 1.005 12/06/2022 1107   PHURINE 5.0 12/06/2022 1107   GLUCOSEU NEGATIVE 12/06/2022 1107   HGBUR MODERATE (A) 12/06/2022 1107   BILIRUBINUR NEGATIVE 12/06/2022 1107   KETONESUR NEGATIVE 12/06/2022 1107   PROTEINUR NEGATIVE 12/06/2022 1107   NITRITE NEGATIVE 12/06/2022 1107   LEUKOCYTESUR NEGATIVE 12/06/2022 1107   Sepsis Labs: @LABRCNTIP (procalcitonin:4,lacticidven:4)  ) Recent Results (from the past 240 hour(s))  Body fluid culture w Gram Stain     Status: None   Collection Time: 12/11/22  6:49 PM   Specimen: Pleural Fluid  Result  Value Ref Range Status   Specimen Description FLUID PLEURAL  Final   Special Requests NONE  Final   Gram Stain NO WBC SEEN NO ORGANISMS SEEN   Final   Culture   Final    NO GROWTH 3 DAYS Performed at [facility], [address] [place], [place] [address]    Report Status 12/15/2022 FINAL  Final     Radiology Studies: DG CHEST PORT 1 VIEW  Result Date: 12/17/2022 CLINICAL DATA:  Shortness of breath and chest pain EXAM: PORTABLE CHEST 1 VIEW COMPARISON:  12/11/2022.  Older exams as well.  CT 12/12/2022 FINDINGS: Small left pleural effusion with some adjacent opacity. No pneumothorax or edema. Normal cardiopericardial silhouette. Overlapping cardiac leads. Degenerative changes along the spine. Film is under penetrated. IMPRESSION: Small left effusion with some adjacent opacity. Previous left IJ catheter no longer seen.  No pneumothorax Electronically Signed   By: Karen Kays M.D.   On: 12/17/2022 17:00     Scheduled Meds:  acetaminophen  650 mg Oral Q6H   amiodarone  400 mg Oral BID   apixaban  5 mg Oral BID   aspirin  81 mg Oral Daily   digoxin  0.125 mg Oral Daily   famotidine  20 mg Oral BID   feeding supplement  237 mL Oral TID BM   lidocaine  1 patch Transdermal Q24H   losartan  25 mg Oral Daily   metoprolol succinate  50 mg Oral Daily   polyethylene glycol  17 g Oral Daily   psyllium  1 packet Oral Daily   rosuvastatin  20 mg Oral Daily   senna-docusate  2 tablet Oral QHS   sodium chloride flush  10 mL Intracatheter Q12H   spironolactone  25 mg Oral Daily   torsemide  40 mg Oral Daily   Continuous Infusions:   LOS: 12 days    Time spent:    Zannie Cove, MD [facility]   12/18/2022, 10:59 AM

## 2022-12-18 NOTE — Progress Notes (Signed)
CARDIAC REHAB PHASE I   PRE:  Rate/Rhythm: 74 NSR  BP:  Sitting: 117/70      SpO2: 98 RA  MODE:  Ambulation: 120 ft    POST:  Rate/Rhythm: 84 NSR  BP:  Sitting: 126/80      SpO2: 97 RA  Pt amb with standby assistance, pt c/p of light pain in chest but was able to return to room. All needs met prior to leaving   ACKLEY BRIXIUS  MS, ACSM-CEP 1:44 PM 12/18/2022    Service time is from 1315 to 1344.

## 2022-12-18 NOTE — PMR Pre-admission (Signed)
PMR Admission Coordinator Pre-Admission Assessment  Patient: Jeremiah Dixon is an 62 y.o., male MRN: [id number] DOB: 08-28-1960 Height: 6\' 2"  (188 cm) Weight: 126.3 kg (Scale A)  Insurance Information HMO: yes    PPO:      PCP:      IPA:      80/20:      OTHER:  PRIMARY: [employer]      Policy#: [id number]      Subscriber: pt CM Name: Kennyth Arnold      Phone#: [phone] ext [phone]     Fax#: [phone] Pre-Cert#: [id number] auth for CIR from [place] with [employer] with updates due to Wynetta Fines [phone] at fax listed above on 11/26   Employer: [employer] Benefits:  Phone #: [phone]     Name:  Eff. Date: 06/03/20     Deduct: $4750 (met)      Out of Pocket Max: $7000 (met [phone])      Life Max: n/a CIR: 70%      SNF: 70% Outpatient: 70%     Co-Pay: 30% Home Health: 70%      Co-Pay: 30% DME: 70%     Co-Pay: 30% Providers:  SECONDARY:       Policy#:      Phone#:   Artist:       Phone#:   The Engineer, materials Information Summary" for patients in Inpatient Rehabilitation Facilities with attached "Privacy Act Statement-Health Care Records" was provided and verbally reviewed with: Patient  Emergency Contact Information Contact Information     Name Relation Home Work Mobile   Nesmith,Bill Brother   [phone]   Heming,Erica Niece   [phone]      Other Contacts   None on File     Current Medical History  Patient Admitting Diagnosis: debility following cardiac arrest   History of Present Illness: Pt is a 62 y/o male with PMH of CAD, HTN, who admitted to [facility] on 11/7 with c/o SOB and chest pain.  EMS report increasingly hypoxic en route requiring 60% NRB.  PEA on arrival to ED 2/2 cardiac/respiratory arrest, CPR started at 0244 and ROSC achieved at 0306.  BP 131/104, HR 100, respiratory rate 7.  CXR showed pulmonary edema.  EKG showed Afib with RVR 110-120s, he was started on heparin drip, IV lasix, and pressors.  He was given TNK for presumed PE.   Echo noted EF of 35% with dilated RV.  He was extubated on 11/8.  Heart cath completed and noted severe three vessel CAD and CABG planned for 12/2 after medical/physical optimization.  He was diuresed aggressively, 34.5 L negative, now euvolemic.  He has ongoing intermittent chest pain treated conservatively.  He is currently on room air.  He underwent TEE/DCCV on 11/15 and converted to sinus rhythm plans to continue amiodarone, digoxin, and apixaban.  Therapy ongoing and pt was recommended for CIR>     Patient's medical record from [facility] has been reviewed by the rehabilitation admission coordinator and physician.  Past Medical History  Past Medical History:  Diagnosis Date   HLD (hyperlipidemia)    HTN (hypertension)    MI (myocardial infarction) (HCC)     Has the patient had major surgery during 100 days prior to admission? No  Family History   family history is not on file.  Current Medications  Current Facility-Administered Medications:    acetaminophen (TYLENOL) tablet 650 mg, 650 mg, Oral, Q6H, Agarwala, Ravi, MD, 650 mg at 12/18/22 1221   amiodarone (PACERONE)  tablet 400 mg, 400 mg, Oral, BID, Bensimhon, Bevelyn Buckles, MD, 400 mg at 12/18/22 0931   apixaban (ELIQUIS) tablet 5 mg, 5 mg, Oral, BID, Bensimhon, Bevelyn Buckles, MD, 5 mg at 12/18/22 0932   aspirin chewable tablet 81 mg, 81 mg, Oral, Daily, Bensimhon, Bevelyn Buckles, MD, 81 mg at 12/18/22 0931   bisacodyl (DULCOLAX) suppository 10 mg, 10 mg, Rectal, Daily PRN, Cloyd Stagers M, PA-C, 10 mg at 12/12/22 1652   chlorpheniramine-HYDROcodone (TUSSIONEX) 10-8 MG/5ML suspension 5 mL, 5 mL, Oral, Q12H PRN, Alen Bleacher, NP   digoxin (LANOXIN) tablet 0.125 mg, 0.125 mg, Oral, Daily, Bensimhon, Bevelyn Buckles, MD, 0.125 mg at 12/18/22 0932   famotidine (PEPCID) tablet 20 mg, 20 mg, Oral, BID, Bensimhon, Bevelyn Buckles, MD, 20 mg at 12/18/22 0932   feeding supplement (ENSURE ENLIVE / ENSURE PLUS) liquid 237 mL, 237 mL, Oral, TID BM, Agarwala, Ravi, MD,  237 mL at 12/18/22 0930   guaiFENesin (MUCINEX) 12 hr tablet 600 mg, 600 mg, Oral, BID PRN, Trisha Mangle, Alma L, NP   lidocaine (LIDODERM) 5 % 1 patch, 1 patch, Transdermal, Q24H, Romie Minus, MD, 1 patch at 12/17/22 1411   losartan (COZAAR) tablet 25 mg, 25 mg, Oral, Daily, Bensimhon, Bevelyn Buckles, MD, 25 mg at 12/18/22 0932   metoprolol succinate (TOPROL-XL) 24 hr tablet 50 mg, 50 mg, Oral, Daily, Bensimhon, Bevelyn Buckles, MD, 50 mg at 12/18/22 0932   morphine (PF) 2 MG/ML injection 2 mg, 2 mg, Intravenous, Q2H PRN, Arrien, York Ram, MD, 2 mg at 12/18/22 0442   ondansetron (ZOFRAN) injection 4 mg, 4 mg, Intravenous, Q6H PRN, Bensimhon, Bevelyn Buckles, MD   Oral care mouth rinse, 15 mL, Mouth Rinse, PRN, Bensimhon, Bevelyn Buckles, MD   oxyCODONE (Oxy IR/ROXICODONE) immediate release tablet 5 mg, 5 mg, Oral, Q4H PRN, Zannie Cove, MD, 5 mg at 12/18/22 0931   polyethylene glycol (MIRALAX / GLYCOLAX) packet 17 g, 17 g, Oral, Daily, Bensimhon, Bevelyn Buckles, MD, 17 g at 12/18/22 0939   psyllium (HYDROCIL/METAMUCIL) 1 packet, 1 packet, Oral, Daily, Arrien, York Ram, MD, 1 packet at 12/18/22 0934   rosuvastatin (CRESTOR) tablet 20 mg, 20 mg, Oral, Daily, Bensimhon, Bevelyn Buckles, MD, 20 mg at 12/18/22 0932   senna-docusate (Senokot-S) tablet 2 tablet, 2 tablet, Oral, QHS, Arrien, York Ram, MD, 2 tablet at 12/17/22 2119   sodium chloride flush (NS) 0.9 % injection 10 mL, 10 mL, Intracatheter, Q12H, Agarwala, Ravi, MD, 10 mL at 12/18/22 1610   spironolactone (ALDACTONE) tablet 25 mg, 25 mg, Oral, Daily, Bensimhon, Bevelyn Buckles, MD, 25 mg at 12/18/22 0931   torsemide (DEMADEX) tablet 40 mg, 40 mg, Oral, Daily, Lee, [place], NP, 40 mg at 12/18/22 0932   traZODone (DESYREL) tablet 100 mg, 100 mg, Oral, QHS PRN, Bensimhon, Bevelyn Buckles, MD, 100 mg at 12/16/22 2319  Patients Current Diet:  Diet Order             Diet Heart Room service appropriate? Yes; Fluid consistency: Thin  Diet effective now                    Precautions / Restrictions Precautions Precautions: Fall Restrictions Weight Bearing Restrictions: No RUE Weight Bearing: Non weight bearing   Has the patient had 2 or more falls or a fall with injury in the past year? Yes  Prior Activity Level Community (5-7x/wk): independent, working at [employer], driving, no DME  Prior Functional Level Self Care: Did the patient need help bathing, dressing, using  the toilet or eating? Independent  Indoor Mobility: Did the patient need assistance with walking from room to room (with or without device)? Independent  Stairs: Did the patient need assistance with internal or external stairs (with or without device)? Independent  Functional Cognition: Did the patient need help planning regular tasks such as shopping or remembering to take medications? Independent  Patient Information Are you of Hispanic, Latino/a,or Spanish origin?: A. No, not of Hispanic, Latino/a, or Spanish origin What is your race?: A. White Do you need or want an interpreter to communicate with a doctor or health care staff?: 0. No  Patient's Response To:  Health Literacy and Transportation Is the patient able to respond to health literacy and transportation needs?: Yes Health Literacy - How often do you need to have someone help you when you read instructions, pamphlets, or other written material from your doctor or pharmacy?: Never In the past 12 months, has lack of transportation kept you from medical appointments or from getting medications?: No In the past 12 months, has lack of transportation kept you from meetings, work, or from getting things needed for daily living?: No  Home Assistive Devices / Equipment Home Equipment: None  Prior Device Use: Indicate devices/aids used by the patient prior to current illness, exacerbation or injury? None of the above  Current Functional Level Cognition  Arousal/Alertness: Awake/alert Overall Cognitive Status: No  family/caregiver present to determine baseline cognitive functioning Current Attention Level: Sustained Orientation Level: Oriented X4 Following Commands: Follows one step commands consistently Safety/Judgement: Decreased awareness of deficits General Comments: pt with difficulty understanding concept of "rehab" and how that correlates with his medical care not just post his open heart surgery. Pt very anxious returning home alone regarding his living situation and being alone. He is also very focused on exercising to get strong prior to surgery. Attention: Focused, Sustained Focused Attention: Appears intact Sustained Attention: Appears intact Memory: Impaired Memory Impairment: Decreased short term memory Decreased Short Term Memory: Verbal basic Problem Solving: Appears intact Executive Function: Reasoning Reasoning: Appears intact    Extremity Assessment (includes Sensation/Coordination)  Upper Extremity Assessment: Generalized weakness  Lower Extremity Assessment: Defer to PT evaluation    ADLs  Overall ADL's : Needs assistance/impaired Eating/Feeding: Modified independent Eating/Feeding Details (indicate cue type and reason): seated in recliner at end of session Grooming: Contact guard assist, Wash/dry hands, Standing Grooming Details (indicate cue type and reason): CGA for safety Upper Body Bathing: Contact guard assist, Sitting Lower Body Bathing: Sit to/from stand, Moderate assistance Upper Body Dressing : Contact guard assist, Sitting Lower Body Dressing: Set up, Cueing for compensatory techniques, Sitting/lateral leans Lower Body Dressing Details (indicate cue type and reason): pt. able to perform lb dressing (socks) reviewed compensatory strategies for energy conservation and safety. Toilet Transfer: Minimal assistance, Ambulation, BSC/3in1, Rolling walker (2 wheels) Toilet Transfer Details (indicate cue type and reason): 1 person HHA Functional mobility during ADLs:  Contact guard assist, Rolling walker (2 wheels) General ADL Comments: lb dressing with good tech. seated    Mobility  Overal bed mobility: Needs Assistance Bed Mobility: Supine to Sit, Sit to Supine Supine to sit: Contact guard Sit to supine: Contact guard assist General bed mobility comments: received in recliner and in recliner on departure    Transfers  Overall transfer level: Needs assistance Equipment used: Rolling walker (2 wheels) Transfers: Sit to/from Stand Sit to Stand: Supervision Bed to/from chair/wheelchair/BSC transfer type:: Step pivot Step pivot transfers: Min assist General transfer comment: verbal cues not to  pull up on walker and to place hands on arm rests or on knees    Ambulation / Gait / Stairs / Wheelchair Mobility  Ambulation/Gait Ambulation/Gait assistance: Editor, commissioning (Feet): 200 Feet Assistive device: Rolling walker (2 wheels) Gait Pattern/deviations: Step-through pattern, Decreased stride length, Trunk flexed General Gait Details: pt with improved fluidity of gait pattern and abiltiy to maintain upright trunk. Pt declined trying to amb without RW due to "I"m just not ready" Gait velocity: dec Gait velocity interpretation: 1.31 - 2.62 ft/sec, indicative of limited community ambulator Stairs: Yes Stairs assistance: Contact guard assist Stair Management: One rail Right, Step to pattern, Sideways Number of Stairs: 4 (limited by IV pole) General stair comments: pt very anxious, noted SOB increased RR however quickly recovered once returned to room    Posture / Balance Balance Overall balance assessment: Needs assistance Sitting-balance support: No upper extremity supported, Feet supported Sitting balance-Leahy Scale: Fair Standing balance support: Bilateral upper extremity supported, During functional activity, Reliant on assistive device for balance Standing balance-Leahy Scale: Fair Standing balance comment: RW in standing    Special  needs/care consideration Diabetic management yes   Previous Home Environment (from acute therapy documentation) Living Arrangements: Alone (PRN assist availavble from brother) Available Help at Discharge: Family, Friend(s), Available PRN/intermittently Type of Home: Apartment Home Layout: Other (Comment) (1 level on the second floor) Home Access: Stairs to enter Entrance Stairs-Number of Steps: flight Bathroom Shower/Tub: Tub/shower unit, Engineer, building services: Standard Home Care Services: No  Discharge Living Setting Plans for Discharge Living Setting: Patient's home Type of Home at Discharge: Apartment Discharge Home Layout: One level Discharge Home Access: Stairs to enter Entrance Stairs-Rails: Right, Left Entrance Stairs-Number of Steps: 7+7 Discharge Bathroom Shower/Tub: Tub/shower unit Discharge Bathroom Toilet: Standard Discharge Bathroom Accessibility: Yes How Accessible: Accessible via walker Does the patient have any problems obtaining your medications?: No  Social/Family/Support Systems Anticipated Caregiver: expect mod I for basic mobility/adls at discharge, has a brother that can provide intermittent assist for iADLs Anticipated Caregiver's Contact Information: brother, Annette Stable [phone] Ability/Limitations of Caregiver: cannot provide 24/7 support Caregiver Availability: Intermittent Discharge Plan Discussed with Primary Caregiver: Yes (patient) Is Caregiver In Agreement with Plan?: Yes Does Caregiver/Family have Issues with Lodging/Transportation while Pt is in Rehab?: No  Goals Patient/Family Goal for Rehab: PT/OT mod I, SLP n/a Expected length of stay: 6-9 days Additional Information: Discharge plan: return to pt's apartment at mod I level, brother can provide intermittent assist with iADLs.  Pt to admit for CABG on 12/31/22 Pt/Family Agrees to Admission and willing to participate: Yes Program Orientation Provided & Reviewed with Pt/Caregiver Including Roles   & Responsibilities: Yes  Barriers to Discharge: Lack of/limited family support  Decrease burden of Care through IP rehab admission: n/a  Possible need for SNF placement upon discharge: Not anticipated.  Pt progressing well with therapy, expect ability to reach mod I to return to his apartment or d/c directly to acute for CABG depending on progression with AIR.   Patient Condition: I have reviewed medical records from [facility], spoken with  [facility] team , and patient. I met with patient at the bedside for inpatient rehabilitation assessment.  Patient will benefit from ongoing PT and OT, can actively participate in 3 hours of therapy a day 5 days of the week, and can make measurable gains during the admission.  Patient will also benefit from the coordinated team approach during an Inpatient Acute Rehabilitation admission.  The patient will receive intensive therapy as  well as Rehabilitation physician, nursing, social worker, and care management interventions.  Due to bladder management, bowel management, safety, skin/wound care, disease management, medication administration, pain management, and patient education the patient requires 24 hour a day rehabilitation nursing.  The patient is currently min assist with mobility and basic ADLs.  Discharge setting and therapy post discharge at home with outpatient is anticipated.  Patient has agreed to participate in the Acute Inpatient Rehabilitation Program and will admit {Time; today/tomorrow:10263}.  Preadmission Screen Completed By:  Stephania Fragmin, PT, DPT 12/18/2022 1:07 PM ______________________________________________________________________   Discussed status with Dr. Marland Kitchen on *** at *** and received approval for admission today.  Admission Coordinator:  Stephania Fragmin, PT, DPT time Marland KitchenDorna Bloom ***   Assessment/Plan: Diagnosis: Does the need for close, 24 hr/day Medical supervision in concert with the patient's rehab needs make it unreasonable for this  patient to be served in a less intensive setting? {yes_no_potentially:3041433} Co-Morbidities requiring supervision/potential complications: *** Due to {due NW:2956213}, does the patient require 24 hr/day rehab nursing? {yes_no_potentially:3041433} Does the patient require coordinated care of a physician, rehab nurse, PT, OT, and SLP to address physical and functional deficits in the context of the above medical diagnosis(es)? {yes_no_potentially:3041433} Addressing deficits in the following areas: {deficits:3041436} Can the patient actively participate in an intensive therapy program of at least 3 hrs of therapy 5 days a week? {yes_no_potentially:3041433} The potential for patient to make measurable gains while on inpatient rehab is {potential:3041437} Anticipated functional outcomes upon discharge from inpatient rehab: {functional outcomes:304600100} PT, {functional outcomes:304600100} OT, {functional outcomes:304600100} SLP Estimated rehab length of stay to reach the above functional goals is: *** Anticipated discharge destination: {anticipated dc setting:21604} 10. Overall Rehab/Functional Prognosis: {potential:3041437}   MD Signature: ***

## 2022-12-18 NOTE — Plan of Care (Signed)
  Problem: Education: Goal: Ability to describe self-care measures that may prevent or decrease complications (Diabetes Survival Skills Education) will improve Outcome: Progressing Goal: Individualized Educational Video(s) Outcome: Progressing   Problem: Coping: Goal: Ability to adjust to condition or change in health will improve Outcome: Progressing   Problem: Fluid Volume: Goal: Ability to maintain a balanced intake and output will improve Outcome: Progressing   Problem: Health Behavior/Discharge Planning: Goal: Ability to identify and utilize available resources and services will improve Outcome: Progressing Goal: Ability to manage health-related needs will improve Outcome: Progressing   Problem: Metabolic: Goal: Ability to maintain appropriate glucose levels will improve Outcome: Progressing   Problem: Nutritional: Goal: Maintenance of adequate nutrition will improve Outcome: Progressing Goal: Progress toward achieving an optimal weight will improve Outcome: Progressing   Problem: Skin Integrity: Goal: Risk for impaired skin integrity will decrease Outcome: Progressing   Problem: Tissue Perfusion: Goal: Adequacy of tissue perfusion will improve Outcome: Progressing   Problem: Education: Goal: Knowledge of General Education information will improve Description: Including pain rating scale, medication(s)/side effects and non-pharmacologic comfort measures Outcome: Progressing   Problem: Health Behavior/Discharge Planning: Goal: Ability to manage health-related needs will improve Outcome: Progressing   Problem: Clinical Measurements: Goal: Ability to maintain clinical measurements within normal limits will improve Outcome: Progressing Goal: Will remain free from infection Outcome: Progressing Goal: Diagnostic test results will improve Outcome: Progressing Goal: Respiratory complications will improve Outcome: Progressing Goal: Cardiovascular complication will  be avoided Outcome: Progressing   Problem: Activity: Goal: Risk for activity intolerance will decrease Outcome: Progressing   Problem: Nutrition: Goal: Adequate nutrition will be maintained Outcome: Progressing   Problem: Coping: Goal: Level of anxiety will decrease Outcome: Progressing   Problem: Elimination: Goal: Will not experience complications related to bowel motility Outcome: Progressing Goal: Will not experience complications related to urinary retention Outcome: Progressing   Problem: Pain Management: Goal: General experience of comfort will improve Outcome: Progressing   Problem: Safety: Goal: Ability to remain free from injury will improve Outcome: Progressing   Problem: Skin Integrity: Goal: Risk for impaired skin integrity will decrease Outcome: Progressing   Problem: Education: Goal: Understanding of CV disease, CV risk reduction, and recovery process will improve Outcome: Progressing Goal: Individualized Educational Video(s) Outcome: Progressing   Problem: Activity: Goal: Ability to return to baseline activity level will improve Outcome: Progressing   Problem: Cardiovascular: Goal: Ability to achieve and maintain adequate cardiovascular perfusion will improve Outcome: Progressing Goal: Vascular access site(s) Level 0-1 will be maintained Outcome: Progressing   Problem: Health Behavior/Discharge Planning: Goal: Ability to safely manage health-related needs after discharge will improve Outcome: Progressing   Problem: Education: Goal: Understanding of CV disease, CV risk reduction, and recovery process will improve Outcome: Progressing Goal: Individualized Educational Video(s) Outcome: Progressing   Problem: Activity: Goal: Ability to return to baseline activity level will improve Outcome: Progressing   Problem: Cardiovascular: Goal: Ability to achieve and maintain adequate cardiovascular perfusion will improve Outcome: Progressing Goal:  Vascular access site(s) Level 0-1 will be maintained Outcome: Progressing   Problem: Health Behavior/Discharge Planning: Goal: Ability to safely manage health-related needs after discharge will improve Outcome: Progressing

## 2022-12-18 NOTE — Progress Notes (Signed)
Patient ID: Jeremiah Dixon, male   DOB: [birth year], 62 y.o.   MRN: [id number]     Advanced Heart Failure Rounding Note  PCP-Cardiologist: None   Subjective:    Had DC-CV 11/15. Remains in NSR  Easily fatigued with minimal activity. Cough improved.  No CP.   Objective:   Weight Range: 126.3 kg Body mass index is 35.75 kg/m.   Vital Signs:   Temp:  [97.4 F (36.3 C)-99.1 F (37.3 C)] 98.2 F (36.8 C) (11/19 0744) Pulse Rate:  [71-80] 76 (11/19 0744) Resp:  [15-26] 15 (11/19 0744) BP: (98-139)/(62-84) 111/70 (11/19 0744) SpO2:  [96 %-97 %] 96 % (11/19 0744) Weight:  [126.3 kg] 126.3 kg (11/19 0520) Last BM Date : 12/17/22  Weight change: Filed Weights   12/16/22 0621 12/17/22 0639 12/18/22 0520  Weight: 126.6 kg 125.9 kg 126.3 kg    Intake/Output:   Intake/Output Summary (Last 24 hours) at 12/18/2022 0906 Last data filed at 12/18/2022 0744 Gross per 24 hour  Intake 120 ml  Output 250 ml  Net -130 ml   Physical Exam   General:  Sitting up in bed. No resp difficulty HEENT: normal Neck: supple. no JVD. Carotids 2+ bilat; no bruits. No lymphadenopathy or thryomegaly appreciated. Cor: PMI nondisplaced. Regular rate & rhythm. No rubs, gallops or murmurs. Lungs: clear Abdomen: soft, nontender, nondistended. No hepatosplenomegaly. No bruits or masses. Good bowel sounds.  Extremities: no cyanosis, clubbing, rash, edema + UNNA Neuro: alert & orientedx3, cranial nerves grossly intact. moves all 4 extremities w/o difficulty. Affect pleasant   Telemetry   NSR 70-80s (Personally reviewed)    Labs    CBC Recent Labs    12/17/22 0302  WBC 6.2  HGB 9.7*  HCT 29.2*  MCV 93.0  PLT 406*   Basic Metabolic Panel Recent Labs    86/57/84 0231 12/17/22 0302 12/18/22 0229  NA 127* 129* 130*  K 3.9 3.9 4.3  CL 90* 92* 93*  CO2 25 27 30   GLUCOSE 127* 117* 114*  BUN 19 18 17   CREATININE 0.87 0.98 0.98  CALCIUM 9.0 8.8* 9.0  MG 2.0 2.0  --    Liver  Function Tests No results for input(s): "AST", "ALT", "ALKPHOS", "BILITOT", "PROT", "ALBUMIN" in the last 72 hours.  No results for input(s): "LIPASE", "AMYLASE" in the last 72 hours. Cardiac Enzymes No results for input(s): "CKTOTAL", "CKMB", "CKMBINDEX", "TROPONINI" in the last 72 hours.  BNP: BNP (last 3 results) Recent Labs    12/06/22 0551  BNP 402.8*    ProBNP (last 3 results) No results for input(s): "PROBNP" in the last 8760 hours.   D-Dimer No results for input(s): "DDIMER" in the last 72 hours. Hemoglobin A1C No results for input(s): "HGBA1C" in the last 72 hours.  Fasting Lipid Panel No results for input(s): "CHOL", "HDL", "LDLCALC", "TRIG", "CHOLHDL", "LDLDIRECT" in the last 72 hours.  Thyroid Function Tests No results for input(s): "TSH", "T4TOTAL", "T3FREE", "THYROIDAB" in the last 72 hours.  Invalid input(s): "FREET3"  Other results:  Imaging   DG CHEST PORT 1 VIEW  Result Date: 12/17/2022 CLINICAL DATA:  Shortness of breath and chest pain EXAM: PORTABLE CHEST 1 VIEW COMPARISON:  12/11/2022.  Older exams as well.  CT 12/12/2022 FINDINGS: Small left pleural effusion with some adjacent opacity. No pneumothorax or edema. Normal cardiopericardial silhouette. Overlapping cardiac leads. Degenerative changes along the spine. Film is under penetrated. IMPRESSION: Small left effusion with some adjacent opacity. Previous left IJ catheter no longer seen.  No pneumothorax Electronically Signed   By: Karen Kays M.D.   On: 12/17/2022 17:00    Medications:    Scheduled Medications:  acetaminophen  650 mg Oral Q6H   amiodarone  400 mg Oral BID   apixaban  5 mg Oral BID   aspirin  81 mg Oral Daily   chlorpheniramine-HYDROcodone  5 mL Oral Q12H   digoxin  0.125 mg Oral Daily   famotidine  20 mg Oral BID   feeding supplement  237 mL Oral TID BM   lidocaine  1 patch Transdermal Q24H   losartan  25 mg Oral Daily   metoprolol succinate  50 mg Oral Daily    polyethylene glycol  17 g Oral Daily   psyllium  1 packet Oral Daily   rosuvastatin  20 mg Oral Daily   senna-docusate  2 tablet Oral QHS   sodium chloride flush  10 mL Intracatheter Q12H   spironolactone  25 mg Oral Daily   torsemide  40 mg Oral Daily    Infusions:    PRN Medications: bisacodyl, guaiFENesin, morphine injection, ondansetron (ZOFRAN) IV, mouth rinse, oxyCODONE, traZODone  Patient Profile   Jeremiah Dixon is a 62 year old male with a history of CAD, HTN,  and HLD.    Presented w/ CP and had PEA arrest in the ED. ROSC ~ 25 minutes. TNK given for presumed PE. AHF team consulted for cardiogenic shock.   Assessment/Plan  1. Acute systolic CHF/Cardiogenic shock: Shock now resolved.  S/p PEA arrest 11/7.  Received tenecteplase in ER given concern for massive PE in setting of PEA arrest but could this all be CHF with new AF/RVR?  HS-TnI rose to 2815 post- arrest.  Bedside echo (somewhat difficult study) with LV EF difficult to quantify but looks in 35% range, RV moderately dilated/moderately dysfunctional with D-shaped septum, IVC dilated.  Extubated on 11/8 and now off all pressors. Diuresed well w/ IV Lasix. 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 Repeat Echo with EF 40-45%, RV nl. - TEE 11/15 EF 35-40% Co-ox 65% - Volume status looks good. Continue Torsemide 40mg  PO. - Continue digoxin 0.125 - Continue Spironolactone 25 mg daily  - Continue losartan 25 mg daily.  - Continue metoprolol succinate 50 daily - Stable on current GDMT regimen. Would wait to transition to [facility] and start SGLT2i until post CABG   2. CAD: Known occlusion of OM1 in 2007.  HS-TnI 397 => 2815 post arrest, no STEMI on ECG.  Has received tenecteplase.  - LHC this admit severe 3v heavily calcified CAD with CTO of Ostial LCx and midRCA. Mid LAD 95%  - CTS consulted. Will proceed with CABG per Dr. Laneta Simmers  first week of Dec. (12/2)  - Continue ASA/statin.  - No s/s amgina  3. 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  Has had tenecteplase.  Lower extremity venous dopplers with no DVT.  Will be hard to definitively confirm PE diagnosis at this point, but will get long-term anticoagulation due to AF.  - doubt PE Suspect initial presentation was HF/AF  4. Atrial fibrillation: With RVR on admission, no prior history.  Converted to NSR on amiodarone initially but back in AF with RVR on 11/9.  - s/p TEE/DC-CV on 11/15.  - Remains in NSR - Continue amio 400 bid - Continue Eliquis 5mg  BID - Continue digoxin as  above  5. Acute hypoxemic respiratory failure: Extubated.  Stable on [place] for comfort - L pleural effusion noted on CXR, drained by CCM and repeat CXR with residual effusion noted - Resolved  6. ETOH abuse: Per brother, "1/2 gallon beer" daily.   7. Hyponatremia: Hypervolemic hyponatremia. Na improved w/ tolvaptan  - 129 today  8. Severe deconditioning - Lives in 2nd floor apt. Unable to get up steps.  - Hopefully can be discharged to CIR for prehab prior to CABG  Stable from HF.CAD perspective. Awaiting CIR decision.   Length of Stay: 12  Arvilla Meres, MD  12/18/2022, 9:06 AM  Advanced Heart Failure Team Pager [phone] (M-F; 7a - 5p)  Please contact [facility] for night-coverage after hours (5p -7a ) and weekends on [email or web address]

## 2022-12-18 NOTE — Progress Notes (Signed)
Mobility Specialist Progress Note:   12/18/22 1000  Mobility  Activity Ambulated with assistance in hallway  Level of Assistance Standby assist, set-up cues, supervision of patient - no hands on  Assistive Device Front wheel walker  Distance Ambulated (ft) 120 ft  RUE Weight Bearing NWB  Activity Response Tolerated well  Mobility Referral Yes  $Mobility charge 1 Mobility  Mobility Specialist Start Time (ACUTE ONLY) 0950  Mobility Specialist Stop Time (ACUTE ONLY) 1002  Mobility Specialist Time Calculation (min) (ACUTE ONLY) 12 min    Pre Mobility: 74 HR Post Mobility:  81 HR  Pt received in bed, agreeable to mobility. C/o chest pain when coughing, RN aware. Asymptomatic throughout ambulation. Returned to room w/o fault. Pt left in chair with call bell and all needs met.  [place]ante Earlene Plater Mobility Specialist Please contact via Special educational needs teacher or Rehab office at [phone]

## 2022-12-18 NOTE — Progress Notes (Signed)
PT Cancellation Note  Patient Details Name: Jeremiah Dixon MRN: [id number] DOB: 1961-01-13   Cancelled Treatment:    Reason Eval/Treat Not Completed: (P) Patient declined, no reason specified (Pt reports walking twice earlier today and plans to perform hygiene tasks soon. Pt politely requests that therapy return tomorrow. Will continue to follow per PT POC.)   Johny Shock 12/18/2022, 2:13 PM

## 2022-12-18 NOTE — Progress Notes (Signed)
Inpatient Rehab Admissions Coordinator:   Met with patient at bedside.  Still on board for CIR, progressing well with therapy.  Cognition seems clearer today.  I did receive auth for CIR, but I do not have a bed available for pt to admit today.  Will follow for admission pending bed availability, hopefully in the next 1-2 days.   Estill Dooms, PT, DPT Admissions Coordinator [phone] 12/18/22  12:01 PM

## 2022-12-18 NOTE — TOC Progression Note (Signed)
Transition of Care [facility]) - Progression Note    Patient Details  Name: Jeremiah Dixon MRN: [id number] Date of Birth: November 26, 1960  [facility] CM/SW Contact  Nicanor Bake Phone Number: [phone] 12/18/2022, 12:19 PM  Clinical Narrative: HF CSW met with pt at bedside. Pt stated that he is concerned about returning home with no rehab. CSW explained that his Berkley Harvey was still pending. Pt stated that he believes he will have challenges going up and down the stairs. CSW stated that someone will update him as soon as we know when his status changes.   TOC will continue following.       Expected Discharge Plan: Home w Home Health Services Barriers to Discharge: Continued Medical Work up  Expected Discharge Plan and Services   Discharge Planning Services: CM Consult   Living arrangements for the past 2 months: Apartment                                       Social Determinants of Health (SDOH) Interventions SDOH Screenings   Food Insecurity: No Food Insecurity (12/06/2022)  Housing: Low Risk  (12/06/2022)  Transportation Needs: Unmet Transportation Needs (12/06/2022)  Utilities: Not At Risk (12/06/2022)  Alcohol Screen: Medium Risk (12/06/2022)  Financial Resource Strain: Low Risk  (12/06/2022)  Tobacco Use: Medium Risk (12/16/2022)    Readmission Risk Interventions     No data to display

## 2022-12-19 ENCOUNTER — Other Ambulatory Visit: Payer: Self-pay

## 2022-12-19 ENCOUNTER — Inpatient Hospital Stay (HOSPITAL_COMMUNITY): Payer: Managed Care, Other (non HMO)

## 2022-12-19 ENCOUNTER — Inpatient Hospital Stay (HOSPITAL_COMMUNITY)
Admission: AD | Admit: 2022-12-19 | Discharge: 2022-12-25 | DRG: 056 | Disposition: A | Discharge status: Home / Self Care | Payer: Managed Care, Other (non HMO) | Source: Intra-hospital | Attending: Physical Medicine and Rehabilitation | Admitting: Physical Medicine and Rehabilitation

## 2022-12-19 ENCOUNTER — Encounter (HOSPITAL_COMMUNITY): Payer: Self-pay | Admitting: Physical Medicine and Rehabilitation

## 2022-12-19 DIAGNOSIS — Z880 Allergy status to penicillin: Secondary | ICD-10-CM

## 2022-12-19 DIAGNOSIS — Z7901 Long term (current) use of anticoagulants: Secondary | ICD-10-CM

## 2022-12-19 DIAGNOSIS — I4891 Unspecified atrial fibrillation: Secondary | ICD-10-CM | POA: Diagnosis present

## 2022-12-19 DIAGNOSIS — E871 Hypo-osmolality and hyponatremia: Secondary | ICD-10-CM | POA: Diagnosis present

## 2022-12-19 DIAGNOSIS — E669 Obesity, unspecified: Secondary | ICD-10-CM | POA: Diagnosis present

## 2022-12-19 DIAGNOSIS — G4700 Insomnia, unspecified: Secondary | ICD-10-CM | POA: Diagnosis present

## 2022-12-19 DIAGNOSIS — R011 Cardiac murmur, unspecified: Secondary | ICD-10-CM | POA: Diagnosis present

## 2022-12-19 DIAGNOSIS — M96A3 Multiple fractures of ribs associated with chest compression and cardiopulmonary resuscitation: Secondary | ICD-10-CM | POA: Diagnosis present

## 2022-12-19 DIAGNOSIS — I252 Old myocardial infarction: Secondary | ICD-10-CM

## 2022-12-19 DIAGNOSIS — Z7982 Long term (current) use of aspirin: Secondary | ICD-10-CM

## 2022-12-19 DIAGNOSIS — Z888 Allergy status to other drugs, medicaments and biological substances status: Secondary | ICD-10-CM

## 2022-12-19 DIAGNOSIS — R053 Chronic cough: Secondary | ICD-10-CM | POA: Diagnosis present

## 2022-12-19 DIAGNOSIS — I2510 Atherosclerotic heart disease of native coronary artery without angina pectoris: Secondary | ICD-10-CM | POA: Diagnosis present

## 2022-12-19 DIAGNOSIS — Z87891 Personal history of nicotine dependence: Secondary | ICD-10-CM | POA: Diagnosis not present

## 2022-12-19 DIAGNOSIS — Z602 Problems related to living alone: Secondary | ICD-10-CM | POA: Diagnosis present

## 2022-12-19 DIAGNOSIS — Z608 Other problems related to social environment: Secondary | ICD-10-CM | POA: Diagnosis present

## 2022-12-19 DIAGNOSIS — R5381 Other malaise: Secondary | ICD-10-CM | POA: Diagnosis present

## 2022-12-19 DIAGNOSIS — I69811 Memory deficit following other cerebrovascular disease: Secondary | ICD-10-CM

## 2022-12-19 DIAGNOSIS — E785 Hyperlipidemia, unspecified: Secondary | ICD-10-CM | POA: Diagnosis present

## 2022-12-19 DIAGNOSIS — I110 Hypertensive heart disease with heart failure: Secondary | ICD-10-CM | POA: Diagnosis present

## 2022-12-19 DIAGNOSIS — R2689 Other abnormalities of gait and mobility: Secondary | ICD-10-CM | POA: Diagnosis present

## 2022-12-19 DIAGNOSIS — D649 Anemia, unspecified: Secondary | ICD-10-CM | POA: Diagnosis present

## 2022-12-19 DIAGNOSIS — Z79899 Other long term (current) drug therapy: Secondary | ICD-10-CM

## 2022-12-19 DIAGNOSIS — I5021 Acute systolic (congestive) heart failure: Secondary | ICD-10-CM | POA: Diagnosis present

## 2022-12-19 DIAGNOSIS — I69898 Other sequelae of other cerebrovascular disease: Secondary | ICD-10-CM | POA: Diagnosis present

## 2022-12-19 DIAGNOSIS — Z9889 Other specified postprocedural states: Secondary | ICD-10-CM

## 2022-12-19 DIAGNOSIS — Z6835 Body mass index (BMI) 35.0-35.9, adult: Secondary | ICD-10-CM

## 2022-12-19 DIAGNOSIS — Z91030 Bee allergy status: Secondary | ICD-10-CM

## 2022-12-19 DIAGNOSIS — Z7141 Alcohol abuse counseling and surveillance of alcoholic: Secondary | ICD-10-CM

## 2022-12-19 DIAGNOSIS — Z8674 Personal history of sudden cardiac arrest: Secondary | ICD-10-CM | POA: Diagnosis not present

## 2022-12-19 LAB — GLUCOSE, CAPILLARY: Glucose-Capillary: 136 mg/dL — ABNORMAL HIGH (ref 70–99)

## 2022-12-19 MED ORDER — LOSARTAN POTASSIUM 25 MG PO TABS: 25.0000 mg | ORAL_TABLET | Freq: Every day | ORAL | Status: DC

## 2022-12-19 MED ORDER — SENNOSIDES-DOCUSATE SODIUM 8.6-50 MG PO TABS: 2.0000 | ORAL_TABLET | Freq: Every day | ORAL | Status: DC

## 2022-12-19 MED ORDER — TRAZODONE HCL 50 MG PO TABS
100.0000 mg | ORAL_TABLET | Freq: Every evening | ORAL | Status: DC | PRN
  Administered 2022-12-19 – 2022-12-24 (×6): 100 mg via ORAL
  Filled 2022-12-19 (×6): qty 2

## 2022-12-19 MED ORDER — ROSUVASTATIN CALCIUM 20 MG PO TABS
20.0000 mg | ORAL_TABLET | Freq: Every day | ORAL | Status: DC
  Administered 2022-12-20 – 2022-12-25 (×6): 20 mg via ORAL
  Filled 2022-12-19 (×6): qty 1

## 2022-12-19 MED ORDER — LIDOCAINE 5 % EX PTCH: 1.0000 | MEDICATED_PATCH | CUTANEOUS | Status: DC

## 2022-12-19 MED ORDER — METOPROLOL SUCCINATE ER 50 MG PO TB24: 50.0000 mg | ORAL_TABLET | Freq: Every day | ORAL | Status: DC

## 2022-12-19 MED ORDER — TRAZODONE HCL 100 MG PO TABS: 100.0000 mg | ORAL_TABLET | Freq: Every evening | ORAL | Status: DC | PRN

## 2022-12-19 MED ORDER — DIPHENHYDRAMINE HCL 25 MG PO CAPS: 25.0000 mg | ORAL_CAPSULE | Freq: Four times a day (QID) | ORAL | Status: DC | PRN

## 2022-12-19 MED ORDER — SENNOSIDES-DOCUSATE SODIUM 8.6-50 MG PO TABS
2.0000 | ORAL_TABLET | Freq: Every day | ORAL | Status: DC
  Administered 2022-12-19 – 2022-12-24 (×6): 2 via ORAL
  Filled 2022-12-19 (×6): qty 2

## 2022-12-19 MED ORDER — ROSUVASTATIN CALCIUM 20 MG PO TABS: 20.0000 mg | ORAL_TABLET | Freq: Every day | ORAL | Status: DC

## 2022-12-19 MED ORDER — APIXABAN 5 MG PO TABS
5.0000 mg | ORAL_TABLET | Freq: Two times a day (BID) | ORAL | Status: DC
  Administered 2022-12-19 – 2022-12-25 (×12): 5 mg via ORAL
  Filled 2022-12-19 (×12): qty 1

## 2022-12-19 MED ORDER — AMIODARONE HCL 400 MG PO TABS: 400.0000 mg | ORAL_TABLET | Freq: Two times a day (BID) | ORAL | Status: DC

## 2022-12-19 MED ORDER — TORSEMIDE 40 MG PO TABS: 40.0000 mg | ORAL_TABLET | Freq: Every day | ORAL | Status: DC

## 2022-12-19 MED ORDER — DIGOXIN 125 MCG PO TABS: 0.1250 mg | ORAL_TABLET | Freq: Every day | ORAL | Status: DC

## 2022-12-19 MED ORDER — GUAIFENESIN ER 600 MG PO TB12: 600.0000 mg | ORAL_TABLET | Freq: Two times a day (BID) | ORAL | Status: DC | PRN

## 2022-12-19 MED ORDER — FLEET ENEMA RE ENEM: 1.0000 | ENEMA | Freq: Once | RECTAL | Status: DC | PRN

## 2022-12-19 MED ORDER — LIDOCAINE 5 % EX PTCH: 1.0000 | MEDICATED_PATCH | Freq: Every day | CUTANEOUS | Status: DC | PRN

## 2022-12-19 MED ORDER — FAMOTIDINE 20 MG PO TABS
20.0000 mg | ORAL_TABLET | Freq: Two times a day (BID) | ORAL | Status: DC
  Administered 2022-12-19 – 2022-12-25 (×12): 20 mg via ORAL
  Filled 2022-12-19 (×12): qty 1

## 2022-12-19 MED ORDER — DIGOXIN 125 MCG PO TABS
0.1250 mg | ORAL_TABLET | Freq: Every day | ORAL | Status: DC
  Administered 2022-12-20 – 2022-12-25 (×6): 0.125 mg via ORAL
  Filled 2022-12-19 (×6): qty 1

## 2022-12-19 MED ORDER — OXYCODONE HCL 5 MG PO TABS
5.0000 mg | ORAL_TABLET | Freq: Four times a day (QID) | ORAL | Status: DC | PRN
  Administered 2022-12-19 – 2022-12-24 (×6): 5 mg via ORAL
  Filled 2022-12-19 (×7): qty 1

## 2022-12-19 MED ORDER — AMIODARONE HCL 200 MG PO TABS
200.0000 mg | ORAL_TABLET | Freq: Two times a day (BID) | ORAL | Status: DC
  Administered 2022-12-20 – 2022-12-25 (×11): 200 mg via ORAL
  Filled 2022-12-19 (×11): qty 1

## 2022-12-19 MED ORDER — POLYETHYLENE GLYCOL 3350 17 G PO PACK
17.0000 g | PACK | Freq: Every day | ORAL | Status: DC
  Administered 2022-12-20 – 2022-12-25 (×6): 17 g via ORAL
  Filled 2022-12-19 (×6): qty 1

## 2022-12-19 MED ORDER — TORSEMIDE 20 MG PO TABS
40.0000 mg | ORAL_TABLET | Freq: Every day | ORAL | Status: DC
  Administered 2022-12-20 – 2022-12-25 (×6): 40 mg via ORAL
  Filled 2022-12-19 (×6): qty 2

## 2022-12-19 MED ORDER — BISACODYL 5 MG PO TBEC
5.0000 mg | DELAYED_RELEASE_TABLET | Freq: Every day | ORAL | Status: DC | PRN
  Administered 2022-12-21: 5 mg via ORAL
  Filled 2022-12-19: qty 1

## 2022-12-19 MED ORDER — GUAIFENESIN-DM 100-10 MG/5ML PO SYRP
10.0000 mL | ORAL_SOLUTION | Freq: Four times a day (QID) | ORAL | Status: DC | PRN
Start: 2022-12-19 — End: 2022-12-25
  Administered 2022-12-19 – 2022-12-23 (×3): 10 mL via ORAL
  Filled 2022-12-19 (×3): qty 10

## 2022-12-19 MED ORDER — ONDANSETRON HCL 4 MG PO TABS: 4.0000 mg | ORAL_TABLET | Freq: Four times a day (QID) | ORAL | Status: DC | PRN

## 2022-12-19 MED ORDER — AMIODARONE HCL 200 MG PO TABS
400.0000 mg | ORAL_TABLET | Freq: Two times a day (BID) | ORAL | Status: AC
  Administered 2022-12-19: 400 mg via ORAL
  Filled 2022-12-19: qty 2

## 2022-12-19 MED ORDER — ASPIRIN 81 MG PO CHEW
81.0000 mg | CHEWABLE_TABLET | Freq: Every day | ORAL | Status: DC
  Administered 2022-12-20 – 2022-12-25 (×6): 81 mg via ORAL
  Filled 2022-12-19 (×6): qty 1

## 2022-12-19 MED ORDER — ASPIRIN 81 MG PO CHEW: 81.0000 mg | CHEWABLE_TABLET | Freq: Every day | ORAL | Status: DC

## 2022-12-19 MED ORDER — ACETAMINOPHEN 325 MG PO TABS
325.0000 mg | ORAL_TABLET | ORAL | Status: DC | PRN
  Administered 2022-12-19 – 2022-12-21 (×4): 650 mg via ORAL
  Administered 2022-12-21: 325 mg via ORAL
  Administered 2022-12-22 – 2022-12-24 (×4): 650 mg via ORAL
  Filled 2022-12-19 (×9): qty 2

## 2022-12-19 MED ORDER — HYDROCOD POLI-CHLORPHE POLI ER 10-8 MG/5ML PO SUER: 5.0000 mL | Freq: Two times a day (BID) | ORAL | Status: DC

## 2022-12-19 MED ORDER — MAGNESIUM HYDROXIDE 400 MG/5ML PO SUSP
30.0000 mL | Freq: Every day | ORAL | Status: DC | PRN
Start: 2022-12-19 — End: 2022-12-25
  Administered 2022-12-22: 30 mL via ORAL
  Filled 2022-12-19: qty 30

## 2022-12-19 MED ORDER — ALUM & MAG HYDROXIDE-SIMETH 200-200-20 MG/5ML PO SUSP: 30.0000 mL | ORAL | Status: DC | PRN

## 2022-12-19 MED ORDER — ONDANSETRON HCL 4 MG/2ML IJ SOLN: 4.0000 mg | Freq: Four times a day (QID) | INTRAMUSCULAR | Status: DC | PRN

## 2022-12-19 MED ORDER — SPIRONOLACTONE 25 MG PO TABS: 25.0000 mg | ORAL_TABLET | Freq: Every day | ORAL | Status: DC

## 2022-12-19 MED ORDER — PSYLLIUM 95 % PO PACK
1.0000 | PACK | Freq: Every day | ORAL | Status: DC
  Administered 2022-12-20 – 2022-12-25 (×6): 1 via ORAL
  Filled 2022-12-19 (×6): qty 1

## 2022-12-19 MED ORDER — METOPROLOL SUCCINATE ER 50 MG PO TB24
50.0000 mg | ORAL_TABLET | Freq: Every day | ORAL | Status: DC
  Administered 2022-12-20 – 2022-12-25 (×6): 50 mg via ORAL
  Filled 2022-12-19 (×7): qty 1

## 2022-12-19 MED ORDER — SPIRONOLACTONE 25 MG PO TABS
25.0000 mg | ORAL_TABLET | Freq: Every day | ORAL | Status: DC
  Administered 2022-12-20 – 2022-12-25 (×6): 25 mg via ORAL
  Filled 2022-12-19 (×6): qty 1

## 2022-12-19 MED ORDER — LOSARTAN POTASSIUM 25 MG PO TABS
25.0000 mg | ORAL_TABLET | Freq: Every day | ORAL | Status: DC
  Administered 2022-12-20 – 2022-12-25 (×6): 25 mg via ORAL
  Filled 2022-12-19 (×6): qty 1

## 2022-12-19 MED ORDER — APIXABAN 5 MG PO TABS: 5.0000 mg | ORAL_TABLET | Freq: Two times a day (BID) | ORAL | Status: DC

## 2022-12-19 MED ORDER — GUAIFENESIN ER 600 MG PO TB12
1200.0000 mg | ORAL_TABLET | Freq: Two times a day (BID) | ORAL | Status: DC
  Administered 2022-12-19 – 2022-12-25 (×12): 1200 mg via ORAL
  Filled 2022-12-19 (×12): qty 2

## 2022-12-19 NOTE — Progress Notes (Signed)
Nutrition Follow-up  DOCUMENTATION CODES:   Not applicable  INTERVENTION:  Continue to encourage adequate PO intake Ensure Enlive po TID, each supplement provides 350 kcal and 20 grams of protein. Continue MVI, folvite and thiamine in setting of EtOH use  NUTRITION DIAGNOSIS:   Inadequate oral intake related to acute illness as evidenced by NPO status. - improved, on PO diet  GOAL:   Patient will meet greater than or equal to 90% of their needs - progressing, addressing via meals and nutrition supplements  MONITOR:   PO intake, Labs, Weight trends, TF tolerance, Skin, I & O's  REASON FOR ASSESSMENT:   Ventilator    ASSESSMENT:   62 yo male admitted post PEA arrest with ROSC after 25 minutes, cardiogenic shock, intubated. PMH includes CAD, HTN, HLD, +EtOH abuse  11/7 admitted; intubated 2/2 cardiac arrest 11/9 extubated 11/11 s/p R/LHC findings of severe 3V disease, mild-mod PAH, normal output 11/13 s/p thoracentesis- yield 1L blood fluid 11/15 s/p TEE, cardioversion  Noted plans to d/c to CIR.   Pt sitting up in bedside recliner, taking medications and preparing to work with therapy. He endorses having a good appetite and eating well with focus on protein containing food. He endorses his tongue is much improved and does not hurt as bad. He recalls consuming 2-3 ensure daily. No other concerns reported at this time.   Meal completions: 100% x 7 documented meal completions (11/15-11/19)  Admission weight history reflects weight has been down trending. 11/8: 141.7 kg 11/20: 126.1 kg Significant decline in presence of edema. Remains on diuretic.  +non-pitting BLE   Medications: pepcid, miralax, psyllium, senna BID, torsemide  Labs: sodium 130, CBG's 100, 127 x24 hours  UOP: x24 hours I/O's: -33.8L since admit  Diet Order:   Diet Order             Diet - low sodium heart healthy           Diet Heart Room service appropriate? Yes; Fluid consistency:  Thin  Diet effective now                   EDUCATION NEEDS:   Education needs have been addressed  Skin:  Skin Assessment: Reviewed RN Assessment  Last BM:  11/18  Height:   Ht Readings from Last 1 Encounters:  12/13/22 6\' 2"  (1.88 m)    Weight:   Wt Readings from Last 1 Encounters:  12/19/22 126.1 kg   BMI:  Body mass index is 35.68 kg/m.  Estimated Nutritional Needs:   Kcal:  2300-2500  Protein:  115-130g  Fluid:  1.8 L  Jeremiah Dixon, RDN, LDN Clinical Nutrition
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
## 2022-12-19 NOTE — H&P (Signed)
Physical Medicine and Rehabilitation Admission H&P   CC: Functional deficits secondary to cardiac debility  HPI: Jeremiah Dixon is a 62 year old male brought in by [facility] from home to the ED on 12/06/2022 with initial reports of callout for chest pain and shortness of breath.  And route he increasingly became hypoxic, pale and cyanotic with subsequent respiratory arrest.  On arrival CPR was started and ROSC was obtained after 22 minutes.  PEA throughout ongoing CPR efforts.  No STEMI on ECG. Intubated and admitted to critical care medicine.  Onset of atrial fibrillation with RVR.  Cardiology consultation and echocardiogram obtained.  Administered tenecteplase for presumed pulmonary embolism and subsequently diagnosed with cardiogenic shock and acute systolic congestive heart failure.  Advanced heart failure team consulted.  Bilateral lower extremity venous duplex exams with no DVT.  Extubated on 11/08 and weaned off all vasopressors.  Had DC/CV 11/15.  He remains in normal sinus rhythm.  On Eliquis 5 mg twice daily.  He was extubated on 11/08 and diuresed.  Underwent cardiac catheterization with severe three-vessel coronary artery disease noted.  Cardiothoracic surgery consulted and plan for CABG early in December.  Repeat echocardiogram with EF 40 to 45%, right ventricle normal.  Did definitively difficult to confirm pulmonary embolism diagnosis but will continue on long-term anticoagulation due to atrial fibrillation.  Suspect initial presentation was due to HF/AF.  Underwent left thoracentesis for left pleural effusion with no residual effusion noted on follow-up chest x-ray. Tolerating diet. Gait distance of 300 feet with RW. The patient requires inpatient medicine and rehabilitation evaluations and services for ongoing dysfunction secondary to cardiac debility.  Social history significant for alcohol use "1/2 gallon of beer" daily per the patient's brother. Lives in [place] apartment with  brother. Works at [employer].   C/o cough.   Review of Systems  Constitutional:  Negative for chills and fever.  HENT:  Negative for congestion and sore throat.   Eyes:  Negative for blurred vision and double vision.  Respiratory:  Positive for cough. Negative for shortness of breath.   Cardiovascular:  Negative for chest pain and palpitations.       Sternal pain  Gastrointestinal:  Negative for constipation, nausea and vomiting.  Genitourinary:  Negative for dysuria and urgency.  Musculoskeletal:  Negative for back pain and neck pain.       Sternal pain with cough and deep breathing  Neurological:  Negative for dizziness and headaches.  Psychiatric/Behavioral:  Negative for depression. The patient does not have insomnia.    Past Medical History:  Diagnosis Date   HLD (hyperlipidemia)    HTN (hypertension)    MI (myocardial infarction) [facility])    Past Surgical History:  Procedure Laterality Date   CARDIOVERSION N/A 12/14/2022   Procedure: CARDIOVERSION (CATH LAB);  Surgeon: Dolores Patty, MD;  Location: [facility] INVASIVE CV LAB;  Service: Cardiovascular;  Laterality: N/A;   RIGHT HEART CATH N/A 12/06/2022   Procedure: RIGHT HEART CATH;  Surgeon: Laurey Morale, MD;  Location: [facility] INVASIVE CV LAB;  Service: Cardiovascular;  Laterality: N/A;   RIGHT/LEFT HEART CATH AND CORONARY ANGIOGRAPHY N/A 12/10/2022   Procedure: RIGHT/LEFT HEART CATH AND CORONARY ANGIOGRAPHY;  Surgeon: Dolores Patty, MD;  Location: [facility] INVASIVE CV LAB;  Service: Cardiovascular;  Laterality: N/A;   TRANSESOPHAGEAL ECHOCARDIOGRAM (CATH LAB) N/A 12/14/2022   Procedure: TRANSESOPHAGEAL ECHOCARDIOGRAM;  Surgeon: Dolores Patty, MD;  Location: [facility] INVASIVE CV LAB;  Service: Cardiovascular;  Laterality: N/A;   History reviewed. No pertinent  family history. Social History:  reports that he has quit smoking. His smoking use included cigarettes. He has never used smokeless tobacco. He reports current alcohol  use of about 8.0 standard drinks of alcohol per week. He reports that he does not use drugs. Allergies:  Allergies  Allergen Reactions   Bee Venom Palpitations   Diclofenac Diarrhea   Amoxicillin Diarrhea and Nausea Only   Lisinopril Cough   Pantoprazole Nausea Only    Other Reaction(s): chest pain   Medications Prior to Admission  Medication Sig Dispense Refill   amiodarone (PACERONE) 400 MG tablet Take 1 tablet (400 mg total) by mouth 2 (two) times daily.     apixaban (ELIQUIS) 5 MG TABS tablet Take 1 tablet (5 mg total) by mouth 2 (two) times daily.     [START ON 12/20/2022] aspirin 81 MG chewable tablet Chew 1 tablet (81 mg total) by mouth daily.     chlorpheniramine-HYDROcodone (TUSSIONEX) 10-8 MG/5ML Take 5 mLs by mouth 2 (two) times daily.     [START ON 12/20/2022] digoxin (LANOXIN) 0.125 MG tablet Take 1 tablet (0.125 mg total) by mouth daily.     esomeprazole (NEXIUM) 40 MG capsule Take 1 capsule (40 mg total) by mouth 2 (two) times daily before a meal. 60 capsule 5   guaiFENesin (MUCINEX) 600 MG 12 hr tablet Take 1 tablet (600 mg total) by mouth 2 (two) times daily as needed for to loosen phlegm.     lidocaine (LIDODERM) 5 % Place 1 patch onto the skin daily. Remove & Discard patch within 12 hours or as directed by MD     [START ON 12/20/2022] losartan (COZAAR) 25 MG tablet Take 1 tablet (25 mg total) by mouth daily.     [START ON 12/20/2022] metoprolol succinate (TOPROL-XL) 50 MG 24 hr tablet Take 1 tablet (50 mg total) by mouth daily. Take with or immediately following a meal.     nitroGLYCERIN (NITROSTAT) 0.4 MG SL tablet Place 0.4 mg under the tongue every 5 (five) minutes as needed. (Patient not taking: Reported on 07/20/2022)     [START ON 12/20/2022] rosuvastatin (CRESTOR) 20 MG tablet Take 1 tablet (20 mg total) by mouth daily.     senna-docusate (SENOKOT-S) 8.6-50 MG tablet Take 2 tablets by mouth at bedtime.     [START ON 12/20/2022] spironolactone (ALDACTONE) 25 MG  tablet Take 1 tablet (25 mg total) by mouth daily.     [START ON 12/20/2022] torsemide 40 MG TABS Take 40 mg by mouth daily.     traZODone (DESYREL) 100 MG tablet Take 1 tablet (100 mg total) by mouth at bedtime as needed for sleep.        Home: Home Living Family/patient expects to be discharged to:: Private residence Living Arrangements: Alone (PRN assist availavble from brother) Available Help at Discharge: Family, Friend(s), Available PRN/intermittently Type of Home: Apartment Home Access: Stairs to enter Secretary/administrator of Steps: flight Home Layout: Other (Comment) (1 level on the second floor) Bathroom Shower/Tub: Tub/shower unit, Engineer, building services: Standard Home Equipment: None   Functional History: Prior Function Prior Level of Function : Independent/Modified Independent, Working/employed, Driving (works at [employer])   Functional Status:  Mobility: Bed Mobility Overal bed mobility: Needs Assistance Bed Mobility: Supine to Sit, Sit to Supine Supine to sit: Contact guard Sit to supine: Contact guard assist General bed mobility comments: received in recliner and in recliner on departure Transfers Overall transfer level: Needs assistance Equipment used: Rolling walker (2 wheels) Transfers: Sit to/from  Stand Sit to Stand: Supervision Bed to/from chair/wheelchair/BSC transfer type:: Step pivot Step pivot transfers: Min assist General transfer comment: verbal cues not to pull up on walker and to place hands on arm rests or on knees Ambulation/Gait Ambulation/Gait assistance: Min assist Gait Distance (Feet): 200 Feet Assistive device: Rolling walker (2 wheels) Gait Pattern/deviations: Step-through pattern, Decreased stride length, Trunk flexed General Gait Details: pt with improved fluidity of gait pattern and abiltiy to maintain upright trunk. Pt declined trying to amb without RW due to "I"m just not ready" Gait velocity: dec Gait velocity interpretation:  1.31 - 2.62 ft/sec, indicative of limited community ambulator Stairs: Yes Stairs assistance: Contact guard assist Stair Management: One rail Right, Step to pattern, Sideways Number of Stairs: 4 (limited by IV pole) General stair comments: pt very anxious, noted SOB increased RR however quickly recovered once returned to room   ADL: ADL Overall ADL's : Needs assistance/impaired Eating/Feeding: Modified independent Eating/Feeding Details (indicate cue type and reason): seated in recliner at end of session Grooming: Contact guard assist, Wash/dry hands, Standing Grooming Details (indicate cue type and reason): CGA for safety Upper Body Bathing: Contact guard assist, Sitting Lower Body Bathing: Sit to/from stand, Moderate assistance Upper Body Dressing : Contact guard assist, Sitting Lower Body Dressing: Set up, Cueing for compensatory techniques, Sitting/lateral leans Lower Body Dressing Details (indicate cue type and reason): pt. able to perform lb dressing (socks) reviewed compensatory strategies for energy conservation and safety. Toilet Transfer: Minimal assistance, Ambulation, BSC/3in1, Rolling walker (2 wheels) Toilet Transfer Details (indicate cue type and reason): 1 person HHA Functional mobility during ADLs: Contact guard assist, Rolling walker (2 wheels) General ADL Comments: lb dressing with good tech. seated   Cognition: Cognition Overall Cognitive Status: No family/caregiver present to determine baseline cognitive functioning Arousal/Alertness: Awake/alert Orientation Level: Oriented X4 Year: 2024 Month: November Day of Week: Correct Attention: Focused, Sustained Focused Attention: Appears intact Sustained Attention: Appears intact Memory: Impaired Memory Impairment: Decreased short term memory Decreased Short Term Memory: Verbal basic Problem Solving: Appears intact Executive Function: Reasoning Reasoning: Appears intact Cognition Arousal: Alert Behavior During  Therapy: WFL for tasks assessed/performed Overall Cognitive Status: No family/caregiver present to determine baseline cognitive functioning Area of Impairment: Safety/judgement, Memory, Awareness, Problem solving, Following commands Current Attention Level: Sustained Memory: Decreased short-term memory Following Commands: Follows one step commands consistently, Follows multi-step commands inconsistently (can follow up to 3 step commands, decresaed organization when asked to follow in order. Needing incresaed time and review of command 2-3 repetitions prior to initiation of following the command.) Safety/Judgement: Decreased awareness of deficits Awareness: Emergent Problem Solving: Slow processing General Comments: pt with difficulty understanding concept of "rehab" and how that correlates with his medical care not just post his open heart surgery. Pt very anxious returning home alone regarding his living situation and being alone. He is also very focused on exercising to get strong prior to surgery.   Physical Exam: Blood pressure 107/71, pulse 70, temperature 97.7 F (36.5 C), temperature source Oral, resp. rate 18, height 6\' 2"  (1.88 m), weight 129.6 kg, SpO2 94%. Physical Exam Gen: no distress, normal appearing HEENT: oral mucosa pink and moist, NCAT Cardio: Reg rate Chest: normal effort, normal rate of breathing Abd: soft, non-distended Ext: no edema Psych: pleasant, normal affect Skin: incision c/d/i Neuro: Alert and oriented x3, slow processing, follows commands well but slowly, strength intact, short term memory impaired  Results for orders placed or performed during the hospital encounter of 12/06/22 (from the past 48  hour(s))  Glucose, capillary     Status: Abnormal   Collection Time: 12/17/22  8:03 PM  Result Value Ref Range   Glucose-Capillary 104 (H) 70 - 99 mg/dL    Comment: Glucose reference range applies only to samples taken after fasting for at least 8 hours.  Basic  metabolic panel     Status: Abnormal   Collection Time: 12/18/22  2:29 AM  Result Value Ref Range   Sodium 130 (L) 135 - 145 mmol/L   Potassium 4.3 3.5 - 5.1 mmol/L   Chloride 93 (L) 98 - 111 mmol/L   CO2 30 22 - 32 mmol/L   Glucose, Bld 114 (H) 70 - 99 mg/dL    Comment: Glucose reference range applies only to samples taken after fasting for at least 8 hours.   BUN 17 8 - 23 mg/dL   Creatinine, Ser 2.95 0.61 - 1.24 mg/dL   Calcium 9.0 8.9 - 28.4 mg/dL   GFR, Estimated >13 >24 mL/min    Comment: (NOTE) Calculated using the CKD-EPI Creatinine Equation (2021)    Anion gap 7 5 - 15    Comment: Performed at [facility], [address] [place], [place] [address]  Glucose, capillary     Status: Abnormal   Collection Time: 12/18/22  7:52 AM  Result Value Ref Range   Glucose-Capillary 127 (H) 70 - 99 mg/dL    Comment: Glucose reference range applies only to samples taken after fasting for at least 8 hours.  Glucose, capillary     Status: Abnormal   Collection Time: 12/18/22  8:13 PM  Result Value Ref Range   Glucose-Capillary 100 (H) 70 - 99 mg/dL    Comment: Glucose reference range applies only to samples taken after fasting for at least 8 hours.  Glucose, capillary     Status: Abnormal   Collection Time: 12/19/22  8:42 AM  Result Value Ref Range   Glucose-Capillary 136 (H) 70 - 99 mg/dL    Comment: Glucose reference range applies only to samples taken after fasting for at least 8 hours.   No results found.    Blood pressure 107/71, pulse 70, temperature 97.7 F (36.5 C), temperature source Oral, resp. rate 18, height 6\' 2"  (1.88 m), weight 129.6 kg, SpO2 94%.  Medical Problem List and Plan: 1. Debility 2/2 cardiac arrest  -patient may shower  -ELOS/Goals: modI 5-7 days  Admit to CIR  2.  Antithrombotics: -DVT/anticoagulation:  Pharmaceutical: Eliquis  -antiplatelet therapy: aspirin 81 mg daily  3. Pain Management: Tylenol as needed  4. Mood/Behavior/Sleep: LCSW  to evaluate and provide emotional support  -continue trazodone 100 mg at bedtime prn  -antipsychotic agents: n/a  5. Neuropsych/cognition: This patient is capable of making decisions on his own behalf.  6. Skin/Wound Care: Routine skin care checks   7. Fluids/Electrolytes/Nutrition: Strict Is and Os and follow-up chemistries  8: Hypertension: monitor TID and prn  9: Hyperlipidemia: continue statin  10: Acute systolic CHF (cardiogenic shock>>resolved)  -daily weight  -continue digoxin 0.125 mg daily  -continue spironolactone 25 mg daily  -continue losartan 25 mg daily  -continue metoprolol succinate 50 mg daily  -continue torsemide 40 mg daily  --continue amiodarone 400 mg BID (decrease to 200 mg BID 11/21)  11: CAD:  -plan CABG 12/02  -follow-up with Dr. Laneta Simmers  -continue asa 81 mg daily  12: Atrial fibrillation with RVR now in NSR:  -on Eliquis 5 mg BID  -continue digoxin 0.125 mg daily  -continue  amiodarone 400 mg BID  13: BLE edema: unna boot dressing placed on acute; denies ulceration  -consider TEDs and remove unna wraps in AM  14: Alcohol use: cessation counseling  15: Obesity: BMI = 35.68  16: Anemia: normal serum iron, elevated ferritin -follow-up CBC  17: Hyponatremia: trending up slowly -follow-up BMP  18: Intermittent productive cough  -continue Mucinex  -check PA/Lat chest x-ray  -continue IS  Wendi Maya  I have personally performed a face to face diagnostic evaluation, including, but not limited to relevant history and physical exam findings, of this patient and developed relevant assessment and plan.  Additionally, I have reviewed and concur with the physician assistant's documentation above.   Horton Chin, MD 12/19/2022

## 2022-12-19 NOTE — Progress Notes (Addendum)
Inpatient Rehabilitation Admission Medication Review by a Pharmacist  A complete drug regimen review was completed for this patient to identify any potential clinically significant medication issues.  High Risk Drug Classes Is patient taking? Indication by Medication  Antipsychotic No   Anticoagulant Yes Apixaban - PE  Antibiotic No   Opioid Yes Oxycodone - prn pain  Antiplatelet Yes Aspirin - CAD  Hypoglycemics/insulin No   Vasoactive Medication Yes Metoprolol XL, spironolactone, digoxin, amiodarone, losartan, torsemide - HTN, Afib, HFrEF    Chemotherapy No   Other Yes Famotidine - GERD Miralax, senna-docusate, MOM, bisacodyl, Fleet, psyllium - constipation Rosuvastatin - HLD Benadryl - prn itching Tylenol, lidocaine patch - prn pain  Trazodone - prn sleep Zofran - prn nausea, vomiting     Type of Medication Issue Identified Description of Issue Recommendation(s)  Drug Interaction(s) (clinically significant)     Duplicate Therapy     Allergy     No Medication Administration End Date     Incorrect Dose     Additional Drug Therapy Needed     Significant med changes from prior encounter (inform family/care partners about these prior to discharge). PTA medications not resumed: esomeprazole, amlodipine  PTA medications stopped: lovastatin, metoprolol IR Consider resuming PTA medications during CIR admit or upon discharge as appropriate    Other       Clinically significant medication issues were identified that warrant physician communication and completion of prescribed/recommended actions by midnight of the next day:  No  Name of provider notified for urgent issues identified:   Provider Method of Notification:     Pharmacist comments: Decrease amiodarone to 200 mg PO bid on 11/21 per Brynda Peon, NP  Time spent performing this drug regimen review (minutes):  30   Thank you for involving pharmacy in this patient's care.  Loura Back, PharmD, BCPS Clinical  Pharmacist Clinical phone for 12/19/2022 is [phone] 12/19/2022 3:32 PM

## 2022-12-19 NOTE — Progress Notes (Signed)
Horton Chin, MD  Physician Physical Medicine and Rehabilitation   PMR Pre-admission    Signed   Date of Service: 12/19/2022  9:45 AM  Related encounter: ED to Hosp-Admission (Current) from 12/06/2022 in [place] 2C CV PROGRESSIVE CARE   Signed     Expand All Collapse All  PMR Admission Coordinator Pre-Admission Assessment   Patient: Jeremiah Dixon is an 61 y.o., male MRN: [id number] DOB: [birth year] Height: 6\' 2"  (188 cm) Weight: 126.3 kg (Scale A)   Insurance Information HMO: yes    PPO:      PCP:      IPA:      80/20:      OTHER:  PRIMARY: [employer]      Policy#: [id number]      Subscriber: pt CM Name: Kennyth Arnold      Phone#: [phone] ext [phone]     Fax#: [phone] Pre-Cert#: [id number] auth for CIR from [place] with [employer] with updates due to Wynetta Fines [phone] at fax listed above on 11/26   Employer: [employer] Benefits:  Phone #: [phone]     Name:  Eff. Date: 06/03/20     Deduct: $4750 (met)      Out of Pocket Max: $7000 (met [phone])      Life Max: n/a CIR: 70%      SNF: 70% Outpatient: 70%     Co-Pay: 30% Home Health: 70%      Co-Pay: 30% DME: 70%     Co-Pay: 30% Providers:  SECONDARY:       Policy#:      Phone#:    Artist:       Phone#:    The Engineer, materials Information Summary" for patients in Inpatient Rehabilitation Facilities with attached "Privacy Act Statement-Health Care Records" was provided and verbally reviewed with: Patient   Emergency Contact Information Contact Information       Name Relation Home Work Mobile    Trombly,Bill Brother     [phone]    Hulet,Erica Niece     [phone]         Other Contacts   None on File        Current Medical History  Patient Admitting Diagnosis: debility following cardiac arrest    History of Present Illness: Pt is a 62 y/o male with PMH of CAD, HTN, who admitted to [facility] on 11/7 with c/o SOB and chest pain.  EMS report increasingly hypoxic en  route requiring 60% NRB.  PEA on arrival to ED 2/2 cardiac/respiratory arrest, CPR started at 0244 and ROSC achieved at 0306.  BP 131/104, HR 100, respiratory rate 7.  CXR showed pulmonary edema.  EKG showed Afib with RVR 110-120s, he was started on heparin drip, IV lasix, and pressors.  He was given TNK for presumed PE.  Echo noted EF of 35% with dilated RV.  He was extubated on 11/8.  Heart cath completed and noted severe three vessel CAD and CABG planned for 12/2 after medical/physical optimization.  He was diuresed aggressively, 34.5 L negative, now euvolemic.  He has ongoing intermittent chest pain treated conservatively.  He is currently on room air.  He underwent TEE/DCCV on 11/15 and converted to sinus rhythm plans to continue amiodarone, digoxin, and apixaban.  Therapy ongoing and pt was recommended for CIR>    Patient's medical record from [facility] has been reviewed by the rehabilitation admission coordinator and physician.   Past Medical History  Past Medical History:  Diagnosis Date   HLD (hyperlipidemia)     HTN (hypertension)     MI (myocardial infarction) (HCC)            Has the patient had major surgery during 100 days prior to admission? No   Family History   family history is not on file.   Current Medications  Current Medications    Current Facility-Administered Medications:    acetaminophen (TYLENOL) tablet 650 mg, 650 mg, Oral, Q6H, Agarwala, Ravi, MD, 650 mg at 12/18/22 1221   amiodarone (PACERONE) tablet 400 mg, 400 mg, Oral, BID, Bensimhon, Bevelyn Buckles, MD, 400 mg at 12/18/22 0931   apixaban (ELIQUIS) tablet 5 mg, 5 mg, Oral, BID, Bensimhon, Bevelyn Buckles, MD, 5 mg at 12/18/22 0932   aspirin chewable tablet 81 mg, 81 mg, Oral, Daily, Bensimhon, Bevelyn Buckles, MD, 81 mg at 12/18/22 0931   bisacodyl (DULCOLAX) suppository 10 mg, 10 mg, Rectal, Daily PRN, Cloyd Stagers M, PA-C, 10 mg at 12/12/22 1652   chlorpheniramine-HYDROcodone (TUSSIONEX) 10-8 MG/5ML suspension 5  mL, 5 mL, Oral, Q12H PRN, Alen Bleacher, NP   digoxin (LANOXIN) tablet 0.125 mg, 0.125 mg, Oral, Daily, Bensimhon, Bevelyn Buckles, MD, 0.125 mg at 12/18/22 0932   famotidine (PEPCID) tablet 20 mg, 20 mg, Oral, BID, Bensimhon, Bevelyn Buckles, MD, 20 mg at 12/18/22 0932   feeding supplement (ENSURE ENLIVE / ENSURE PLUS) liquid 237 mL, 237 mL, Oral, TID BM, Agarwala, Ravi, MD, 237 mL at 12/18/22 0930   guaiFENesin (MUCINEX) 12 hr tablet 600 mg, 600 mg, Oral, BID PRN, Trisha Mangle, Alma L, NP   lidocaine (LIDODERM) 5 % 1 patch, 1 patch, Transdermal, Q24H, Romie Minus, MD, 1 patch at 12/17/22 1411   losartan (COZAAR) tablet 25 mg, 25 mg, Oral, Daily, Bensimhon, Bevelyn Buckles, MD, 25 mg at 12/18/22 0932   metoprolol succinate (TOPROL-XL) 24 hr tablet 50 mg, 50 mg, Oral, Daily, Bensimhon, Bevelyn Buckles, MD, 50 mg at 12/18/22 0932   morphine (PF) 2 MG/ML injection 2 mg, 2 mg, Intravenous, Q2H PRN, Arrien, York Ram, MD, 2 mg at 12/18/22 0442   ondansetron (ZOFRAN) injection 4 mg, 4 mg, Intravenous, Q6H PRN, Bensimhon, Bevelyn Buckles, MD   Oral care mouth rinse, 15 mL, Mouth Rinse, PRN, Bensimhon, Bevelyn Buckles, MD   oxyCODONE (Oxy IR/ROXICODONE) immediate release tablet 5 mg, 5 mg, Oral, Q4H PRN, Zannie Cove, MD, 5 mg at 12/18/22 0931   polyethylene glycol (MIRALAX / GLYCOLAX) packet 17 g, 17 g, Oral, Daily, Bensimhon, Bevelyn Buckles, MD, 17 g at 12/18/22 0939   psyllium (HYDROCIL/METAMUCIL) 1 packet, 1 packet, Oral, Daily, Arrien, York Ram, MD, 1 packet at 12/18/22 0934   rosuvastatin (CRESTOR) tablet 20 mg, 20 mg, Oral, Daily, Bensimhon, Bevelyn Buckles, MD, 20 mg at 12/18/22 0932   senna-docusate (Senokot-S) tablet 2 tablet, 2 tablet, Oral, QHS, Arrien, York Ram, MD, 2 tablet at 12/17/22 2119   sodium chloride flush (NS) 0.9 % injection 10 mL, 10 mL, Intracatheter, Q12H, Agarwala, Ravi, MD, 10 mL at 12/18/22 1610   spironolactone (ALDACTONE) tablet 25 mg, 25 mg, Oral, Daily, Bensimhon, Bevelyn Buckles, MD, 25 mg at 12/18/22 0931    torsemide (DEMADEX) tablet 40 mg, 40 mg, Oral, Daily, Lee, [place], NP, 40 mg at 12/18/22 0932   traZODone (DESYREL) tablet 100 mg, 100 mg, Oral, QHS PRN, Bensimhon, Bevelyn Buckles, MD, 100 mg at 12/16/22 2319     Patients Current Diet:  Diet Order  Diet Heart Room service appropriate? Yes; Fluid consistency: Thin  Diet effective now                         Precautions / Restrictions Precautions Precautions: Fall Restrictions Weight Bearing Restrictions: No RUE Weight Bearing: Non weight bearing    Has the patient had 2 or more falls or a fall with injury in the past year? Yes   Prior Activity Level Community (5-7x/wk): independent, working at [employer], driving, no DME   Prior Functional Level Self Care: Did the patient need help bathing, dressing, using the toilet or eating? Independent   Indoor Mobility: Did the patient need assistance with walking from room to room (with or without device)? Independent   Stairs: Did the patient need assistance with internal or external stairs (with or without device)? Independent   Functional Cognition: Did the patient need help planning regular tasks such as shopping or remembering to take medications? Independent   Patient Information Are you of Hispanic, Latino/a,or Spanish origin?: A. No, not of Hispanic, Latino/a, or Spanish origin What is your race?: A. White Do you need or want an interpreter to communicate with a doctor or health care staff?: 0. No   Patient's Response To:  Health Literacy and Transportation Is the patient able to respond to health literacy and transportation needs?: Yes Health Literacy - How often do you need to have someone help you when you read instructions, pamphlets, or other written material from your doctor or pharmacy?: Never In the past 12 months, has lack of transportation kept you from medical appointments or from getting medications?: No In the past 12 months, has lack of transportation  kept you from meetings, work, or from getting things needed for daily living?: No   Home Assistive Devices / Equipment Home Equipment: None   Prior Device Use: Indicate devices/aids used by the patient prior to current illness, exacerbation or injury? None of the above   Current Functional Level Cognition   Arousal/Alertness: Awake/alert Overall Cognitive Status: No family/caregiver present to determine baseline cognitive functioning Current Attention Level: Sustained Orientation Level: Oriented X4 Following Commands: Follows one step commands consistently Safety/Judgement: Decreased awareness of deficits General Comments: pt with difficulty understanding concept of "rehab" and how that correlates with his medical care not just post his open heart surgery. Pt very anxious returning home alone regarding his living situation and being alone. He is also very focused on exercising to get strong prior to surgery. Attention: Focused, Sustained Focused Attention: Appears intact Sustained Attention: Appears intact Memory: Impaired Memory Impairment: Decreased short term memory Decreased Short Term Memory: Verbal basic Problem Solving: Appears intact Executive Function: Reasoning Reasoning: Appears intact    Extremity Assessment (includes Sensation/Coordination)   Upper Extremity Assessment: Generalized weakness  Lower Extremity Assessment: Defer to PT evaluation     ADLs   Overall ADL's : Needs assistance/impaired Eating/Feeding: Modified independent Eating/Feeding Details (indicate cue type and reason): seated in recliner at end of session Grooming: Contact guard assist, Wash/dry hands, Standing Grooming Details (indicate cue type and reason): CGA for safety Upper Body Bathing: Contact guard assist, Sitting Lower Body Bathing: Sit to/from stand, Moderate assistance Upper Body Dressing : Contact guard assist, Sitting Lower Body Dressing: Set up, Cueing for compensatory techniques,  Sitting/lateral leans Lower Body Dressing Details (indicate cue type and reason): pt. able to perform lb dressing (socks) reviewed compensatory strategies for energy conservation and safety. Toilet Transfer: Minimal assistance, Ambulation, BSC/3in1, Rolling walker (2  wheels) Toilet Transfer Details (indicate cue type and reason): 1 person HHA Functional mobility during ADLs: Contact guard assist, Rolling walker (2 wheels) General ADL Comments: lb dressing with good tech. seated     Mobility   Overal bed mobility: Needs Assistance Bed Mobility: Supine to Sit, Sit to Supine Supine to sit: Contact guard Sit to supine: Contact guard assist General bed mobility comments: received in recliner and in recliner on departure     Transfers   Overall transfer level: Needs assistance Equipment used: Rolling walker (2 wheels) Transfers: Sit to/from Stand Sit to Stand: Supervision Bed to/from chair/wheelchair/BSC transfer type:: Step pivot Step pivot transfers: Min assist General transfer comment: verbal cues not to pull up on walker and to place hands on arm rests or on knees     Ambulation / Gait / Stairs / Wheelchair Mobility   Ambulation/Gait Ambulation/Gait assistance: Editor, commissioning (Feet): 200 Feet Assistive device: Rolling walker (2 wheels) Gait Pattern/deviations: Step-through pattern, Decreased stride length, Trunk flexed General Gait Details: pt with improved fluidity of gait pattern and abiltiy to maintain upright trunk. Pt declined trying to amb without RW due to "I"m just not ready" Gait velocity: dec Gait velocity interpretation: 1.31 - 2.62 ft/sec, indicative of limited community ambulator Stairs: Yes Stairs assistance: Contact guard assist Stair Management: One rail Right, Step to pattern, Sideways Number of Stairs: 4 (limited by IV pole) General stair comments: pt very anxious, noted SOB increased RR however quickly recovered once returned to room     Posture /  Balance Balance Overall balance assessment: Needs assistance Sitting-balance support: No upper extremity supported, Feet supported Sitting balance-Leahy Scale: Fair Standing balance support: Bilateral upper extremity supported, During functional activity, Reliant on assistive device for balance Standing balance-Leahy Scale: Fair Standing balance comment: RW in standing     Special needs/care consideration Diabetic management yes    Previous Home Environment (from acute therapy documentation) Living Arrangements: Alone (PRN assist availavble from brother) Available Help at Discharge: Family, Friend(s), Available PRN/intermittently Type of Home: Apartment Home Layout: Other (Comment) (1 level on the second floor) Home Access: Stairs to enter Entrance Stairs-Number of Steps: flight Bathroom Shower/Tub: Tub/shower unit, Engineer, building services: Standard Home Care Services: No   Discharge Living Setting Plans for Discharge Living Setting: Patient's home Type of Home at Discharge: Apartment Discharge Home Layout: One level Discharge Home Access: Stairs to enter Entrance Stairs-Rails: Right, Left Entrance Stairs-Number of Steps: 7+7 Discharge Bathroom Shower/Tub: Tub/shower unit Discharge Bathroom Toilet: Standard Discharge Bathroom Accessibility: Yes How Accessible: Accessible via walker Does the patient have any problems obtaining your medications?: No   Social/Family/Support Systems Anticipated Caregiver: expect mod I for basic mobility/adls at discharge, has a brother that can provide intermittent assist for iADLs Anticipated Caregiver's Contact Information: brother, Annette Stable [phone] Ability/Limitations of Caregiver: cannot provide 24/7 support Caregiver Availability: Intermittent Discharge Plan Discussed with Primary Caregiver: Yes (patient) Is Caregiver In Agreement with Plan?: Yes Does Caregiver/Family have Issues with Lodging/Transportation while Pt is in Rehab?: No    Goals Patient/Family Goal for Rehab: PT/OT mod I, SLP n/a Expected length of stay: 6-9 days Additional Information: Discharge plan: return to pt's apartment at mod I level, brother can provide intermittent assist with iADLs.  Pt to admit for CABG on 12/31/22 Pt/Family Agrees to Admission and willing to participate: Yes Program Orientation Provided & Reviewed with Pt/Caregiver Including Roles  & Responsibilities: Yes  Barriers to Discharge: Lack of/limited family support   Decrease burden of Care through IP  rehab admission: n/a   Possible need for SNF placement upon discharge: Not anticipated.  Pt progressing well with therapy, expect ability to reach mod I to return to his apartment or d/c directly to acute for CABG depending on progression with AIR.    Patient Condition: I have reviewed medical records from [facility], spoken with  [facility] team , and patient. I met with patient at the bedside for inpatient rehabilitation assessment.  Patient will benefit from ongoing PT and OT, can actively participate in 3 hours of therapy a day 5 days of the week, and can make measurable gains during the admission.  Patient will also benefit from the coordinated team approach during an Inpatient Acute Rehabilitation admission.  The patient will receive intensive therapy as well as Rehabilitation physician, nursing, social worker, and care management interventions.  Due to bladder management, bowel management, safety, skin/wound care, disease management, medication administration, pain management, and patient education the patient requires 24 hour a day rehabilitation nursing.  The patient is currently min assist with mobility and basic ADLs.  Discharge setting and therapy post discharge at home with outpatient is anticipated.  Patient has agreed to participate in the Acute Inpatient Rehabilitation Program and will admit today.   Preadmission Screen Completed By:  Stephania Fragmin, PT, DPT 12/18/2022 1:07  PM ______________________________________________________________________   Discussed status with Dr. Carlis Abbott on 12/19/22  at 9:44 AM  and received approval for admission today.   Admission Coordinator:  Stephania Fragmin, PT, DPT time 9:44 AM Dorna Bloom 12/19/22     Assessment/Plan: Diagnosis: Debility following cardiac arrest Does the need for close, 24 hr/day Medical supervision in concert with the patient's rehab needs make it unreasonable for this patient to be served in a less intensive setting? Yes Co-Morbidities requiring supervision/potential complications: CAD, HTN, SOB, chest pain, hypoxia Due to bladder management, bowel management, safety, skin/wound care, disease management, medication administration, pain management, and patient education, does the patient require 24 hr/day rehab nursing? Yes Does the patient require coordinated care of a physician, rehab nurse, PT, OT to address physical and functional deficits in the context of the above medical diagnosis(es)? Yes Addressing deficits in the following areas: balance, endurance, locomotion, strength, transferring, bowel/bladder control, bathing, dressing, feeding, grooming, toileting, and psychosocial support Can the patient actively participate in an intensive therapy program of at least 3 hrs of therapy 5 days a week? Yes The potential for patient to make measurable gains while on inpatient rehab is excellent Anticipated functional outcomes upon discharge from inpatient rehab: modified independent PT, modified independent OT, independent SLP Estimated rehab length of stay to reach the above functional goals is: 5-7 days Anticipated discharge destination: Home 10. Overall Rehab/Functional Prognosis: excellent     MD Signature: Sula Soda, MD          Revision History

## 2022-12-19 NOTE — Progress Notes (Signed)
Inpatient Rehab Admissions Coordinator:    I have insurance approval and a bed available for pt to admit to CIR today. Dr. Renford Dills in agreement.  Will let pt/family and TOC team know.   Estill Dooms, PT, DPT Admissions Coordinator [phone] 12/19/22  10:00 AM

## 2022-12-19 NOTE — Progress Notes (Signed)
Discussed with pt IS, sternal precautions, mobility post op and d/c planning. Very receptive. He is performing 1800  ml on IS. Able to stand and sit following sternal precautions. Gave [facility] booklet and careguide. Will watch preop video on CIR. He will discuss with his brother care at d/c.  [phone] Ethelda Chick BS, ACSM-CEP 12/19/2022 11:14 AM

## 2022-12-19 NOTE — Discharge Summary (Addendum)
Physician Discharge Summary  Iseah Norwick ZOX:[id number] DOB: 09-11-1960 DOA: 12/06/2022  PCP: Sandre Kitty, PA-C  Admit date: 12/06/2022 Discharge date: 12/19/2022  Admitted From: Home Disposition: CIR  Discharge Condition:Stable CODE STATUS:FULL Diet recommendation: Heart Healthy   Brief/Interim Summary: Patient is a 62/M with history of CAD, hypertension presented with chest pain to the ED, had a PEA arrest in the emergency room, ROSC 25 minutes, given TN K for presumed PE, subsequently diagnosed with cardiogenic shock and acute systolic CHF.Echo noted EF of 35% with dilated RV, extubated 11/8, off pressors.Diuresed well, followed by heart failure team, cath noted severe three-vessel CAD.CABG planned in early December per Dr. Laneta Simmers.  PT/OT recommending CIR on discharge.  Medically stable for discharge today   Following problems were addressed during the hospitalization:  Acute systolic CHF/cardiogenic shock SP PEA arrest 11/7 -Echo noted EF of 35-40%, global hypokinesis, mildly reduced RV with RV dilation  -RHC/LHC noted severe three-vessel disease, mild to moderate PAH, normal output -Followed by heart failure team, stable off pressors, diuresed aggressively, 34.5 L negative  -Now euvolemic, continue digoxin, Aldactone, metoprolol, losartan -Continue torsemide 40 Mg daily -Plan for SGLT2i and Entresto post CABG   Intermittent chest pain -Pleuritic, noted with position changes, secondary to prolonged CPR and rib fractures -Overall improving, continue supportive care   Multivessel CAD -LHC 11/11 noted severe three-vessel disease -Continue aspirin, statin, metoprolol, plan for CABG on 12/2 by Dr. Laneta Simmers   Acute hypoxemic respiratory failure  Rib fractures from CPR -due to acute cardiogenic pulmonary edema primarily -?Left lower lobe aspiration pneumonia present on admission.  -Continue incentive spirometry, also completed course of Unasyn   Atrial fibrillation  (HCC) with RVR 11/15 TEE/ direct current cardioversion, returning to sinus rhythm.  -Now on oral amiodarone, digoxin and apixaban   Hyponatremia -Improved   Chronic anemia Hgb 9.2  -Anemia panel suggestive of chronic disease,   History of EtOH abuse -No withdrawal noted   Pre-diabetes:   Obesity, class 2 Calculated BMI is 36  Discharge Diagnoses:  Active Problems:   Acute clinical systolic heart failure (HCC)   Coronary artery disease   Atrial fibrillation (HCC)   Essential hypertension   Hyponatremia   Chronic anemia   Pre-diabetes   Obesity, class 2    Discharge Instructions  Discharge Instructions     Diet - low sodium heart healthy   Complete by: As directed    Discharge instructions   Complete by: As directed    1)Take your medications as instructed 2)You will be followed by cardiology and cardiothoracic surgery.   Increase activity slowly   Complete by: As directed    No wound care   Complete by: As directed       Allergies as of 12/19/2022       Reactions   Bee Venom Palpitations   Diclofenac Diarrhea   Amoxicillin Diarrhea, Nausea Only   Lisinopril Cough   Pantoprazole Nausea Only   Other Reaction(s): chest pain        Medication List     STOP taking these medications    amLODipine 10 MG tablet Commonly known as: NORVASC   lovastatin 40 MG tablet Commonly known as: MEVACOR   metoprolol tartrate 50 MG tablet Commonly known as: LOPRESSOR       TAKE these medications    amiodarone 400 MG tablet Commonly known as: PACERONE Take 1 tablet (400 mg total) by mouth 2 (two) times daily.   apixaban 5 MG Tabs tablet Commonly known  asEverlene Balls Take 1 tablet (5 mg total) by mouth 2 (two) times daily.   aspirin 81 MG chewable tablet Chew 1 tablet (81 mg total) by mouth daily. Start taking on: December 20, 2022   chlorpheniramine-HYDROcodone 10-8 MG/5ML Commonly known as: TUSSIONEX Take 5 mLs by mouth 2 (two) times daily.    digoxin 0.125 MG tablet Commonly known as: LANOXIN Take 1 tablet (0.125 mg total) by mouth daily. Start taking on: December 20, 2022   esomeprazole 40 MG capsule Commonly known as: NEXIUM Take 1 capsule (40 mg total) by mouth 2 (two) times daily before a meal.   guaiFENesin 600 MG 12 hr tablet Commonly known as: MUCINEX Take 1 tablet (600 mg total) by mouth 2 (two) times daily as needed for to loosen phlegm.   lidocaine 5 % Commonly known as: LIDODERM Place 1 patch onto the skin daily. Remove & Discard patch within 12 hours or as directed by MD   losartan 25 MG tablet Commonly known as: COZAAR Take 1 tablet (25 mg total) by mouth daily. Start taking on: December 20, 2022 What changed:  medication strength how much to take   metoprolol succinate 50 MG 24 hr tablet Commonly known as: TOPROL-XL Take 1 tablet (50 mg total) by mouth daily. Take with or immediately following a meal. Start taking on: December 20, 2022   nitroGLYCERIN 0.4 MG SL tablet Commonly known as: NITROSTAT Place 0.4 mg under the tongue every 5 (five) minutes as needed.   rosuvastatin 20 MG tablet Commonly known as: CRESTOR Take 1 tablet (20 mg total) by mouth daily. Start taking on: December 20, 2022   senna-docusate 8.6-50 MG tablet Commonly known as: Senokot-S Take 2 tablets by mouth at bedtime.   spironolactone 25 MG tablet Commonly known as: ALDACTONE Take 1 tablet (25 mg total) by mouth daily. Start taking on: December 20, 2022   Torsemide 40 MG Tabs Take 40 mg by mouth daily. Start taking on: December 20, 2022   traZODone 100 MG tablet Commonly known as: DESYREL Take 1 tablet (100 mg total) by mouth at bedtime as needed for sleep.        Allergies  Allergen Reactions   Bee Venom Palpitations   Diclofenac Diarrhea   Amoxicillin Diarrhea and Nausea Only   Lisinopril Cough   Pantoprazole Nausea Only    Other Reaction(s): chest pain     Consultations: Cardiology   Procedures/Studies: DG CHEST PORT 1 VIEW  Result Date: 12/17/2022 CLINICAL DATA:  Shortness of breath and chest pain EXAM: PORTABLE CHEST 1 VIEW COMPARISON:  12/11/2022.  Older exams as well.  CT 12/12/2022 FINDINGS: Small left pleural effusion with some adjacent opacity. No pneumothorax or edema. Normal cardiopericardial silhouette. Overlapping cardiac leads. Degenerative changes along the spine. Film is under penetrated. IMPRESSION: Small left effusion with some adjacent opacity. Previous left IJ catheter no longer seen.  No pneumothorax Electronically Signed   By: Karen Kays M.D.   On: 12/17/2022 17:00   VAS US DOPPLER PRE CABG  Result Date: 12/14/2022 PREOPERATIVE VASCULAR EVALUATION Patient Name:  QUILLAN BORK  Date of Exam:   12/14/2022 Medical Rec #: [id number]             Accession #:    [id number] Date of Birth: [birth year]             Patient Gender: M Patient Age:   47 years Exam Location:  [facility] Procedure:      VAS US DOPPLER PRE  CABG Referring Phys: Evelene Croon --------------------------------------------------------------------------------  Indications:      Pre-CABG. Risk Factors:     Hypertension, hyperlipidemia. Limitations:      Left neck central line/bandages, lower extremity bandages                   (toes to knees) Comparison Study: No prior studies. Performing Technologist: Olen Cordial RVT  Examination Guidelines: A complete evaluation includes B-mode imaging, spectral Doppler, color Doppler, and power Doppler as needed of all accessible portions of each vessel. Bilateral testing is considered an integral part of a complete examination. Limited examinations for reoccurring indications may be performed as noted.  Right Carotid Findings: +----------+--------+--------+--------+-----------------------+--------+           PSV cm/sEDV cm/sStenosisDescribe               Comments  +----------+--------+--------+--------+-----------------------+--------+ CCA Prox  91      20              smooth and heterogenous         +----------+--------+--------+--------+-----------------------+--------+ CCA Distal80      23              smooth and heterogenous         +----------+--------+--------+--------+-----------------------+--------+ ICA Prox  43      13              smooth and heterogenous         +----------+--------+--------+--------+-----------------------+--------+ ICA Mid   48      18                                     tortuous +----------+--------+--------+--------+-----------------------+--------+ ICA Distal50      21                                     tortuous +----------+--------+--------+--------+-----------------------+--------+ ECA       67      9                                               +----------+--------+--------+--------+-----------------------+--------+ +----------+--------+-------+--------+------------+           PSV cm/sEDV cmsDescribeArm Pressure +----------+--------+-------+--------+------------+ Subclavian107                                 +----------+--------+-------+--------+------------+ +---------+--------+--+--------+-+---------+ VertebralPSV cm/s27EDV cm/s7Antegrade +---------+--------+--+--------+-+---------+ Left Carotid Findings: +----------+--------+--------+--------+-----------------------+--------------+           PSV cm/sEDV cm/sStenosisDescribe               Comments       +----------+--------+--------+--------+-----------------------+--------------+ CCA Prox  70      19              smooth and heterogenous               +----------+--------+--------+--------+-----------------------+--------------+ CCA Distal72      24              smooth and heterogenous               +----------+--------+--------+--------+-----------------------+--------------+ ICA Prox  Not visualized +----------+--------+--------+--------+-----------------------+--------------+ ICA Mid                                                  Not visualized +----------+--------+--------+--------+-----------------------+--------------+ ICA Distal                                               Not visualized +----------+--------+--------+--------+-----------------------+--------------+ ECA                                                      Not visualized +----------+--------+--------+--------+-----------------------+--------------+ +----------+--------+--------+--------+------------+ SubclavianPSV cm/sEDV cm/sDescribeArm Pressure +----------+--------+--------+--------+------------+           105                                  +----------+--------+--------+--------+------------+ +---------+--------+--------+--------------+ VertebralPSV cm/sEDV cm/sNot visualized +---------+--------+--------+--------------+  ABI Findings: +------------------+-----+---------+ Rt Pressure (mmHg)IndexWaveform  +------------------+-----+---------+ 95                     triphasic +------------------+-----+---------+ 100               0.94 triphasic +------------------+-----+---------+ +------------------+-----+---------+ Lt Pressure (mmHg)IndexWaveform  +------------------+-----+---------+ 106                    triphasic +------------------+-----+---------+ 102               0.96 triphasic +------------------+-----+---------+ +-------+---------------+ ABI/TBIToday's ABI/TBI +-------+---------------+ Right  0.94            +-------+---------------+ Left   0.96            +-------+---------------+  Right Doppler Findings: +--------+--------+---------+ Site    PressureDoppler   +--------+--------+---------+ [id number]      triphasic +--------+--------+---------+ Radial          triphasic  +--------+--------+---------+ Ulnar           triphasic +--------+--------+---------+  Left Doppler Findings: +--------+--------+---------+ Site    PressureDoppler   +--------+--------+---------+ [id number]     triphasic +--------+--------+---------+ Radial          triphasic +--------+--------+---------+ Ulnar           triphasic +--------+--------+---------+   Summary: Right Carotid: Velocities in the right ICA are consistent with a 1-39% stenosis. Vertebrals: Right vertebral artery demonstrates antegrade flow. Right ABI: Resting right ankle-brachial index indicates mild right lower extremity arterial disease. Left ABI: Resting left ankle-brachial index is within normal range. Right Upper Extremity: Doppler waveform obliterate with right radial compression. Doppler waveforms decrease 50% with right ulnar compression. Left Upper Extremity: Doppler waveform obliterate with left radial compression. Doppler waveforms decrease >50% with left ulnar compression.  Electronically signed by Lemar Livings MD on 12/14/2022 at 6:58:19 PM.    Final    EP STUDY  Result Date: 12/14/2022 See surgical note for result.  CT CHEST W CONTRAST  Result Date: 12/12/2022 CLINICAL DATA:  Heart failure EXAM: CT CHEST WITH CONTRAST TECHNIQUE: Multidetector CT imaging of the chest was performed during intravenous contrast administration. RADIATION DOSE REDUCTION: This exam was performed according to the departmental  dose-optimization program which includes automated exposure control, adjustment of the mA and/or kV according to patient size and/or use of iterative reconstruction technique. CONTRAST:  50mL ISOVUE-370 IOPAMIDOL (ISOVUE-370) INJECTION 76% COMPARISON:  None Available. FINDINGS: Cardiovascular: Cardiomegaly. Trace pericardial effusion. Normal caliber thoracic aorta with mild calcified plaque. Severe left main and three-vessel coronary artery calcifications. Mediastinum/Nodes: Esophagus thyroid are  unremarkable. No No enlarged lymph nodes seen in the chest. Lungs/Pleura: Central airways are patent. Left lower lobe ground-glass opacities. Small left pleural effusion. No pneumothorax. Upper Abdomen: Gallstones. Indeterminate right adrenal gland nodule measuring 11 mm on series 3, image 156. Musculoskeletal: Acute mildly displaced and comminuted fractures of the anterior and lateral left 3rd-7th ribs with the 3rd, 5th and 6th ribs fractured both anteriorly and laterally. Soft tissue hematoma of the left chest which is most pronounced of the level of the third and fourth ribs. Displaced left 6th and 7th costochondral fractures with mild adjacent chest wall hematoma. Soft tissue stranding of the left chest wall. Nondisplaced fracture of the mid sternum. IMPRESSION: 1. Left chest wall injury with multiple fractures of the anterolateral left 3rd-7th ribs, displaced left 6th and 7th costochondral fractures, and chest wall hematoma. 2. Nondisplaced fracture of the mid sternum. 3. Small left pleural effusion. No pneumothorax. 4. Left lower lobe ground-glass opacities, likely due to aspiration or contusion. 5. Indeterminate right adrenal gland nodule measuring 11 mm. Recommend nonemergent adrenal protocol CT further evaluation. 6. Coronary artery calcifications and aortic Atherosclerosis (ICD10-I70.0). Electronically Signed   By: Allegra Lai M.D.   On: 12/12/2022 13:16   DG CHEST PORT 1 VIEW  Result Date: 12/11/2022 CLINICAL DATA:  142230 with pleural effusion post thoracentesis. Recent cardiac arrest. EXAM: PORTABLE CHEST 1 VIEW COMPARISON:  Portable chest yesterday at 11:59 a.m. FINDINGS: 6:37 p.m. Small left pleural effusion remains post thoracentesis. There is no measurable pneumothorax. The lungs hypoinflated. There atelectatic bands in the lower zones. No focal consolidation is seen. Stable cardiomegaly. No vascular congestion findings. Stable mediastinum with aortic atherosclerosis. Left IJ central line  again terminates in the upper SVC. No new osseous abnormality. Overlying monitor wires. IMPRESSION: 1. Small left pleural effusion remains post thoracentesis. No measurable pneumothorax. 2. Hypoinflated lungs with atelectatic bands in the lower zones. 3. Stable cardiomegaly. 4. Aortic atherosclerosis. Electronically Signed   By: Almira Bar M.D.   On: 12/11/2022 22:45   ECHOCARDIOGRAM LIMITED  Result Date: 12/11/2022    ECHOCARDIOGRAM LIMITED REPORT   Patient Name:   ACENCION TONI Date of Exam: 12/11/2022 Medical Rec #:  [id number]            Height:       74.0 in Accession #:    [id number]           Weight:       289.5 lb Date of Birth:  1960-10-06            BSA:          2.543 m Patient Age:    62 years             BP:           118/86 mmHg Patient Gender: M                    HR:           112 bpm. Exam Location:  Inpatient Procedure: Limited Echo and Intracardiac Opacification Agent Indications:    I48.91* Unspecified atrial fibrillation  History:  Patient has prior history of Echocardiogram examinations, most                 recent 12/06/2022. Arrythmias:Atrial Fibrillation; Risk                 Factors:Hypertension and Dyslipidemia.  Sonographer:    Irving Burton Senior RDCS Referring Phys: [id number] DANIEL R BENSIMHON  Sonographer Comments: Limited to assess LV function IMPRESSIONS  1. Left ventricular ejection fraction, by estimation, is 40 to 45%. The left ventricle has mildly decreased function. The left ventricle demonstrates global hypokinesis.  2. Tricuspid regurgitation signal is inadequate for assessing PA pressure.  3. The inferior vena cava is normal in size with greater than 50% respiratory variability, suggesting right atrial pressure of 3 mmHg.  4. Consider repeat 2D echo once heart rate better controlled. FINDINGS  Left Ventricle: Left ventricular ejection fraction, by estimation, is 40 to 45%. The left ventricle has mildly decreased function. The left ventricle demonstrates global  hypokinesis. Definity contrast agent was given IV to delineate the left ventricular  endocardial borders. Right Ventricle: Tricuspid regurgitation signal is inadequate for assessing PA pressure. Venous: The inferior vena cava is normal in size with greater than 50% respiratory variability, suggesting right atrial pressure of 3 mmHg. Armanda Magic MD Electronically signed by Armanda Magic MD Signature Date/Time: 12/11/2022/1:21:07 PM    Final    CARDIAC CATHETERIZATION  Result Date: 12/10/2022   Prox RCA to Mid RCA lesion is 100% stenosed.   Prox RCA lesion is 60% stenosed.   Ost LAD lesion is 70% stenosed.   Mid LM to Dist LM lesion is 70% stenosed.   Ost Cx lesion is 100% stenosed.   Mid LAD lesion is 95% stenosed.   Prox LAD to Mid LAD lesion is 50% stenosed.   2nd Diag lesion is 40% stenosed. Findings: Ao = 101/66 (79) LV = 99/12 RA = 8 RV = 45/10 PA = 53/15 (29) PCW = 12 Fick cardiac output/index = 8.1/3.2 PVR = 2.1 WU Ao sat = 89% PA sat = 55%, 54% PAPi = 4.6 Assessment: 1. Severe 3v heavily calcified CAD with CTO of Ostial LCx and midRCA. Mid LAD 95% 2. Unable to assess LVEF by V-gram 3. Mild to moderate PAH with normal PCWP and output Plan/Discussion: Needs CABG but with recent massive PE will need to discuss timing with TCTS. Will consult. Repeat echo Arvilla Meres, MD 5:54 PM  DG Chest Port 1 View  Result Date: 12/10/2022 CLINICAL DATA:  Pneumonia EXAM: PORTABLE CHEST 1 VIEW COMPARISON:  X-ray 12/08/2022.  Older exams as well. FINDINGS: Interval removal of the Swan-Ganz catheter. No right-sided pneumothorax. Stable left IJ catheter. Enlarged cardiopericardial silhouette. Bilateral pleural effusions and opacities, left-greater-than-right. No edema. Overlapping cardiac leads. Degenerative changes along the spine. IMPRESSION: Interval removal of the right IJ line.  No pneumothorax. Bilateral pleural effusions and opacities, left-greater-than-right. Recommend continued follow up Electronically Signed    By: Karen Kays M.D.   On: 12/10/2022 17:04   DG CHEST PORT 1 VIEW  Result Date: 12/08/2022 CLINICAL DATA:  Congestive heart failure. EXAM: PORTABLE CHEST 1 VIEW COMPARISON:  December 07, 2022. FINDINGS: Stable cardiomediastinal silhouette. Endotracheal and nasogastric tubes have been removed. Left internal jugular catheter and right internal jugular Swan-Ganz catheter are unchanged. Right lung is clear. Mild left pleural effusion is noted with associated left basilar atelectasis or pneumonia. Bony thorax is unremarkable. IMPRESSION: Left basilar atelectasis or pneumonia with associated pleural effusion. Electronically Signed   By: Fayrene Fearing  Christen Butter M.D.   On: 12/08/2022 14:06   DG CHEST PORT 1 VIEW  Result Date: 12/07/2022 CLINICAL DATA:  CHF EXAM: PORTABLE CHEST 1 VIEW COMPARISON:  X-ray 12/06/2022. FINDINGS: Stable ET tube, enteric tube and left IJ catheter. Placement of a right IJ catheter now seen with the tip in the right hilum, Swan-Ganz catheter. Overlapping cardiac leads. Defibrillator pads. Stable cardiopericardial silhouette. Persistent left retrocardiac opacity. Small left effusion. No pneumothorax identified. IMPRESSION: Interval placement of a right IJ Swan-Ganz catheter with tip in the right lung hilum on this x-ray. Please correlate for utilization of the Swan at the time of the x-ray. No pneumothorax. Electronically Signed   By: Karen Kays M.D.   On: 12/07/2022 14:51   VAS [place] LOWER EXTREMITY VENOUS (DVT)  Result Date: 12/06/2022  Lower Venous DVT Study Patient Name:  LAREY HERTEL  Date of Exam:   12/06/2022 Medical Rec #: [id number]             Accession #:    [id number] Date of Birth: [birth year]             Patient Gender: M Patient Age:   9 years Exam Location:  [facility] Procedure:      VAS [place] LOWER EXTREMITY VENOUS (DVT) Referring Phys: AMY CLEGG --------------------------------------------------------------------------------  Indications: Swelling, and Edema.  Risk  Factors: Suspected PE. Comparison Study: No prior exam. Performing Technologist: Fernande Bras  Examination Guidelines: A complete evaluation includes B-mode imaging, spectral Doppler, color Doppler, and power Doppler as needed of all accessible portions of each vessel. Bilateral testing is considered an integral part of a complete examination. Limited examinations for reoccurring indications may be performed as noted. The reflux portion of the exam is performed with the patient in reverse Trendelenburg.  +---------+---------------+---------+-----------+----------+--------------+ RIGHT    CompressibilityPhasicitySpontaneityPropertiesThrombus Aging +---------+---------------+---------+-----------+----------+--------------+ CFV      Full           Yes      Yes                                 +---------+---------------+---------+-----------+----------+--------------+ SFJ      Full                                                        +---------+---------------+---------+-----------+----------+--------------+ FV Prox  Full                                                        +---------+---------------+---------+-----------+----------+--------------+ FV Mid   Full                                                        +---------+---------------+---------+-----------+----------+--------------+ FV DistalFull                                                        +---------+---------------+---------+-----------+----------+--------------+  PFV      Full                                                        +---------+---------------+---------+-----------+----------+--------------+ POP      Full           Yes      Yes                                 +---------+---------------+---------+-----------+----------+--------------+ PTV      Full                                                         +---------+---------------+---------+-----------+----------+--------------+ PERO     Full                                                        +---------+---------------+---------+-----------+----------+--------------+   +---------+---------------+---------+-----------+----------+--------------+ LEFT     CompressibilityPhasicitySpontaneityPropertiesThrombus Aging +---------+---------------+---------+-----------+----------+--------------+ CFV      Full           Yes      Yes                                 +---------+---------------+---------+-----------+----------+--------------+ SFJ      Full                                                        +---------+---------------+---------+-----------+----------+--------------+ FV Prox  Full                                                        +---------+---------------+---------+-----------+----------+--------------+ FV Mid   Full                                                        +---------+---------------+---------+-----------+----------+--------------+ FV DistalFull                                                        +---------+---------------+---------+-----------+----------+--------------+ PFV      Full                                                        +---------+---------------+---------+-----------+----------+--------------+  POP      Full           Yes      Yes                                 +---------+---------------+---------+-----------+----------+--------------+ PTV      Full                                                        +---------+---------------+---------+-----------+----------+--------------+ PERO     Full                                                        +---------+---------------+---------+-----------+----------+--------------+     Summary: BILATERAL: - No evidence of deep vein thrombosis seen in the lower extremities, bilaterally. -No evidence of  popliteal cyst, bilaterally.   *See table(s) above for measurements and observations. Electronically signed by Heath Lark on 12/06/2022 at 6:04:07 PM.    Final    DG Chest Port 1 View  Result Date: 12/06/2022 CLINICAL DATA:  Central line placement EXAM: PORTABLE CHEST 1 VIEW COMPARISON:  Chest radiograph obtained earlier the same day FINDINGS: There is a new left IJ central venous catheter terminating in the mid SVC. The endotracheal tube tip is proximally 3.7 cm from the carina. The enteric catheter tip and sidehole project over the stomach. The heart is enlarged, unchanged. The upper mediastinal contours are stable. There is a probable small layering left pleural effusion with adjacent airspace opacity. Aeration in both lungs has improved compared to the study from earlier the same day particularly on the right. There is no significant right effusion. There is no pneumothorax There is no acute osseous abnormality. IMPRESSION: 1. New left IJ central venous catheter with the tip in the mid SVC. 2. Improved aeration in both lungs since the study from earlier the same day with a persistent layering left pleural effusion and adjacent airspace opacity. Electronically Signed   By: Lesia Hausen M.D.   On: 12/06/2022 11:36   ECHOCARDIOGRAM COMPLETE  Result Date: 12/06/2022    ECHOCARDIOGRAM REPORT   Patient Name:   KADESH SULLO Date of Exam: 12/06/2022 Medical Rec #:  [id number]            Height:       74.0 in Accession #:    [id number]           Weight:       277.8 lb Date of Birth:  02-27-1960            BSA:          2.499 m Patient Age:    62 years             BP:           80/47 mmHg Patient Gender: M                    HR:           101 bpm. Exam Location:  Inpatient Procedure: 2D Echo, Cardiac Doppler, Color Doppler and Intracardiac  Opacification Agent Indications:    Cardiac Arrest I46.9  History:        Patient has no prior history of Echocardiogram examinations.                 Acute  MI.  Sonographer:    Harriette Bouillon RDCS Referring Phys: [id number] Beulah Gandy PAYNE IMPRESSIONS  1. Limited acoustic windows for imaging. Limited diagnostic quality.  2. Left ventricular ejection fraction, by estimation, is 35 to 40%. Septal flattening. The left ventricle has moderately decreased function. Left ventricular endocardial border not optimally defined to evaluate regional wall motion. The left ventricular  internal cavity size was mildly dilated. Left ventricular diastolic parameters are indeterminate.  3. Right ventricular systolic function is mildly reduced particularly at mid and apex. The right ventricular size is mildly enlarged. Tricuspid regurgitation signal is inadequate for assessing PA pressure.  4. Right atrial size was mildly dilated.  5. The mitral valve is grossly normal. Trivial mitral valve regurgitation.  6. The aortic valve is grossly normal. There is mild calcification of the aortic valve. Aortic valve regurgitation is not visualized. No aortic stenosis is present.  7. The inferior vena cava is dilated in size with <50% respiratory variability, suggesting right atrial pressure of 15 mmHg. FINDINGS  Left Ventricle: Left ventricular ejection fraction, by estimation, is 35 to 40%. The left ventricle has moderately decreased function. Left ventricular endocardial border not optimally defined to evaluate regional wall motion. Definity contrast agent was given IV to delineate the left ventricular endocardial borders. The left ventricular internal cavity size was mildly dilated. There is borderline left ventricular hypertrophy. Left ventricular diastolic parameters are indeterminate. Right Ventricle: The right ventricular size is mildly enlarged. Right vetricular wall thickness was not well visualized. Right ventricular systolic function is mildly reduced. Tricuspid regurgitation signal is inadequate for assessing PA pressure. Left Atrium: Left atrial size was normal in size. Right Atrium: Right  atrial size was mildly dilated. Pericardium: There is no evidence of pericardial effusion. Mitral Valve: The mitral valve is grossly normal. Trivial mitral valve regurgitation. Tricuspid Valve: The tricuspid valve is grossly normal. Tricuspid valve regurgitation is trivial. Aortic Valve: The aortic valve is grossly normal. There is mild calcification of the aortic valve. Aortic valve regurgitation is not visualized. No aortic stenosis is present. Pulmonic Valve: The pulmonic valve was not well visualized. Pulmonic valve regurgitation is trivial. Aorta: The aortic root is normal in size and structure. Venous: The inferior vena cava is dilated in size with less than 50% respiratory variability, suggesting right atrial pressure of 15 mmHg. IAS/Shunts: No atrial level shunt detected by color flow Doppler.  LEFT VENTRICLE PLAX 2D LVIDd:         5.80 cm LVIDs:         4.90 cm LV PW:         1.00 cm LV IVS:        1.00 cm LVOT diam:     2.40 cm LV SV:         49 LV SV Index:   19 LVOT Area:     4.52 cm  RIGHT VENTRICLE         IVC TAPSE (M-mode): 1.2 cm  IVC diam: 2.70 cm LEFT ATRIUM         Index LA diam:    3.80 cm 1.52 cm/m  AORTIC VALVE LVOT Vmax:   79.30 cm/s LVOT Vmean:  54.296 cm/s LVOT VTI:    0.108 m  AORTA Ao Root  diam: 3.10 cm Ao Asc diam:  3.60 cm  SHUNTS Systemic VTI:  0.11 m Systemic Diam: 2.40 cm Weston Brass MD Electronically signed by Weston Brass MD Signature Date/Time: 12/06/2022/11:20:05 AM    Final    DG Chest Portable 1 View  Result Date: 12/06/2022 CLINICAL DATA:  CPR, intubated, respiratory arrest EXAM: PORTABLE CHEST 1 VIEW COMPARISON:  06/13/2020 FINDINGS: Endotracheal tube is 5.5 cm above the carina. Cardiomegaly. Diffuse bilateral airspace disease. No effusions or pneumothorax. No visible acute bony abnormality. IMPRESSION: Cardiomegaly. Diffuse bilateral airspace disease likely edema. Electronically Signed   By: Charlett Nose M.D.   On: 12/06/2022 03:21      Subjective: Patient  seen and examined at bedside today.  Sitting on the chair.  Comfortable.  No chest pain.  On room air.  Complains of some dry cough which has bothered him for last few days.  Hemodynamically stable for discharge to CIR today.  Discharge Exam: Vitals:   12/19/22 0930 12/19/22 1131  BP:  138/84  Pulse:  74  Resp: 17 14  Temp:  98.3 F (36.8 C)  SpO2:  98%   Vitals:   12/19/22 0430 12/19/22 0911 12/19/22 0930 12/19/22 1131  BP: 128/82 90/76  138/84  Pulse: 72 81  74  Resp: 20 (!) 22 17 14   Temp: 97.8 F (36.6 C) 98 F (36.7 C)  98.3 F (36.8 C)  TempSrc: Oral Oral  Oral  SpO2: 98% 98%  98%  Weight: 126.1 kg     Height:        General: Pt is alert, awake, not in acute distress Cardiovascular: RRR, S1/S2 +, no rubs, no gallops Respiratory: CTA bilaterally, no wheezing, no rhonchi Abdominal: Soft, NT, ND, bowel sounds + Extremities: trace lower extremity edema, no cyanosis    The results of significant diagnostics from this hospitalization (including imaging, microbiology, ancillary and laboratory) are listed below for reference.     Microbiology: Recent Results (from the past 240 hour(s))  Body fluid culture w Gram Stain     Status: None   Collection Time: 12/11/22  6:49 PM   Specimen: Pleural Fluid  Result Value Ref Range Status   Specimen Description FLUID PLEURAL  Final   Special Requests NONE  Final   Gram Stain NO WBC SEEN NO ORGANISMS SEEN   Final   Culture   Final    NO GROWTH 3 DAYS Performed at [facility], [address], [place], [place] [address]    Report Status 12/15/2022 FINAL  Final     Labs: BNP (last 3 results) Recent Labs    12/06/22 0551  BNP 402.8*   Basic Metabolic Panel: Recent Labs  Lab 12/13/22 0423 12/13/22 1711 12/14/22 0452 12/15/22 0550 12/16/22 0231 12/17/22 0302 12/18/22 0229  NA 126*   < > 131* 130* 127* 129* 130*  K 3.6   < > 4.1 3.8 3.9 3.9 4.3  CL 90*   < > 95* 92* 90* 92* 93*  CO2 27   < > 26 29 25 27 30    GLUCOSE 96   < > 105* 96 127* 117* 114*  BUN 11   < > 15 15 19 18 17   CREATININE 1.18   < > 0.92 0.95 0.87 0.98 0.98  CALCIUM 8.5*   < > 8.8* 9.0 9.0 8.8* 9.0  MG 2.0  --  2.0 1.9 2.0 2.0  --    < > = values in this interval not displayed.  Liver Function Tests: No results for input(s): "AST", "ALT", "ALKPHOS", "BILITOT", "PROT", "ALBUMIN" in the last 168 hours. No results for input(s): "LIPASE", "AMYLASE" in the last 168 hours. No results for input(s): "AMMONIA" in the last 168 hours. CBC: Recent Labs  Lab 12/13/22 0423 12/14/22 0452 12/15/22 0550 12/17/22 0302  WBC 7.9 6.3 6.5 6.2  HGB 9.1* 9.2* 9.6* 9.7*  HCT 27.6* 27.8* 28.9* 29.2*  MCV 94.8 94.9 94.8 93.0  PLT 284 297 352 406*   Cardiac Enzymes: No results for input(s): "CKTOTAL", "CKMB", "CKMBINDEX", "TROPONINI" in the last 168 hours. BNP: Invalid input(s): "POCBNP" CBG: Recent Labs  Lab 12/17/22 0801 12/17/22 2003 12/18/22 0752 12/18/22 2013 12/19/22 0842  GLUCAP 149* 104* 127* 100* 136*   D-Dimer No results for input(s): "DDIMER" in the last 72 hours. Hgb A1c No results for input(s): "HGBA1C" in the last 72 hours. Lipid Profile No results for input(s): "CHOL", "HDL", "LDLCALC", "TRIG", "CHOLHDL", "LDLDIRECT" in the last 72 hours. Thyroid function studies No results for input(s): "TSH", "T4TOTAL", "T3FREE", "THYROIDAB" in the last 72 hours.  Invalid input(s): "FREET3" Anemia work up No results for input(s): "VITAMINB12", "FOLATE", "FERRITIN", "TIBC", "IRON", "RETICCTPCT" in the last 72 hours. Urinalysis    Component Value Date/Time   COLORURINE STRAW (A) 12/06/2022 1107   APPEARANCEUR CLEAR 12/06/2022 1107   LABSPEC 1.005 12/06/2022 1107   PHURINE 5.0 12/06/2022 1107   GLUCOSEU NEGATIVE 12/06/2022 1107   HGBUR MODERATE (A) 12/06/2022 1107   BILIRUBINUR NEGATIVE 12/06/2022 1107   KETONESUR NEGATIVE 12/06/2022 1107   PROTEINUR NEGATIVE 12/06/2022 1107   NITRITE NEGATIVE 12/06/2022 1107    LEUKOCYTESUR NEGATIVE 12/06/2022 1107   Sepsis Labs Recent Labs  Lab 12/13/22 0423 12/14/22 0452 12/15/22 0550 12/17/22 0302  WBC 7.9 6.3 6.5 6.2   Microbiology Recent Results (from the past 240 hour(s))  Body fluid culture w Gram Stain     Status: None   Collection Time: 12/11/22  6:49 PM   Specimen: Pleural Fluid  Result Value Ref Range Status   Specimen Description FLUID PLEURAL  Final   Special Requests NONE  Final   Gram Stain NO WBC SEEN NO ORGANISMS SEEN   Final   Culture   Final    NO GROWTH 3 DAYS Performed at [facility], [address] [place], [place] [address]    Report Status 12/15/2022 FINAL  Final    Please note: You were cared for by a hospitalist during your hospital stay. Once you are discharged, your primary care physician will handle any further medical issues. Please note that NO REFILLS for any discharge medications will be authorized once you are discharged, as it is imperative that you return to your primary care physician (or establish a relationship with a primary care physician if you do not have one) for your post hospital discharge needs so that they can reassess your need for medications and monitor your lab values.    Time coordinating discharge: 40 minutes  SIGNED:   Burnadette Pop, MD  [facility] 12/19/2022, 12:59 PM Pager [phone]  If 7PM-7AM, please contact night-coverage [email or web address] Password [email or web address]

## 2022-12-19 NOTE — Progress Notes (Signed)
Patient arrived and oriented to unit routine. Verbalized understanding of protocol to call for assistance for all needs while on rehab. No questions or concerns, resting in recliner with call bell in place

## 2022-12-19 NOTE — Progress Notes (Signed)
Patient ID: Jeremiah Dixon, male   DOB: 02-04-1960, 62 y.o.   MRN: [id number]     Advanced Heart Failure Rounding Note  PCP-Cardiologist: None   Subjective:    Had DC-CV 11/15. Remains in NSR  Feels ok. Easily fatigued but stable.   CIR bed approved.   Objective:   Weight Range: 126.1 kg Body mass index is 35.68 kg/m.   Vital Signs:   Temp:  [97.8 F (36.6 C)-98.7 F (37.1 C)] 98 F (36.7 C) (11/20 0911) Pulse Rate:  [60-81] 81 (11/20 0911) Resp:  [14-22] 22 (11/20 0911) BP: (90-128)/(64-82) 90/76 (11/20 0911) SpO2:  [98 %] 98 % (11/20 0911) Weight:  [126.1 kg] 126.1 kg (11/20 0430) Last BM Date : 12/17/22  Weight change: Filed Weights   12/17/22 0639 12/18/22 0520 12/19/22 0430  Weight: 125.9 kg 126.3 kg 126.1 kg    Intake/Output:   Intake/Output Summary (Last 24 hours) at 12/19/2022 1030 Last data filed at 12/19/2022 0439 Gross per 24 hour  Intake 777.26 ml  Output 350 ml  Net 427.26 ml   Physical Exam   General:  Sitting up in bed. No resp difficulty HEENT: normal Neck: supple. no JVD. Carotids 2+ bilat; no bruits. No lymphadenopathy or thryomegaly appreciated. Cor: PMI nondisplaced. Regular rate & rhythm. No rubs, gallops or murmurs. Lungs: clear Abdomen: obese soft, nontender, nondistended. No hepatosplenomegaly. No bruits or masses. Good bowel sounds. Extremities: no cyanosis, clubbing, rash, edema Neuro: alert & orientedx3, cranial nerves grossly intact. moves all 4 extremities w/o difficulty. Affect pleasant   Telemetry   NSR 70-80s  (Personally reviewed)    Labs    CBC Recent Labs    12/17/22 0302  WBC 6.2  HGB 9.7*  HCT 29.2*  MCV 93.0  PLT 406*   Basic Metabolic Panel Recent Labs    40/98/11 0302 12/18/22 0229  NA 129* 130*  K 3.9 4.3  CL 92* 93*  CO2 27 30  GLUCOSE 117* 114*  BUN 18 17  CREATININE 0.98 0.98  CALCIUM 8.8* 9.0  MG 2.0  --    Liver Function Tests No results for input(s): "AST", "ALT", "ALKPHOS",  "BILITOT", "PROT", "ALBUMIN" in the last 72 hours.  No results for input(s): "LIPASE", "AMYLASE" in the last 72 hours. Cardiac Enzymes No results for input(s): "CKTOTAL", "CKMB", "CKMBINDEX", "TROPONINI" in the last 72 hours.  BNP: BNP (last 3 results) Recent Labs    12/06/22 0551  BNP 402.8*    ProBNP (last 3 results) No results for input(s): "PROBNP" in the last 8760 hours.   D-Dimer No results for input(s): "DDIMER" in the last 72 hours. Hemoglobin A1C No results for input(s): "HGBA1C" in the last 72 hours.  Fasting Lipid Panel No results for input(s): "CHOL", "HDL", "LDLCALC", "TRIG", "CHOLHDL", "LDLDIRECT" in the last 72 hours.  Thyroid Function Tests No results for input(s): "TSH", "T4TOTAL", "T3FREE", "THYROIDAB" in the last 72 hours.  Invalid input(s): "FREET3"  Other results:  Imaging   No results found.  Medications:    Scheduled Medications:  acetaminophen  650 mg Oral Q6H   amiodarone  400 mg Oral BID   apixaban  5 mg Oral BID   aspirin  81 mg Oral Daily   digoxin  0.125 mg Oral Daily   famotidine  20 mg Oral BID   feeding supplement  237 mL Oral TID BM   lidocaine  1 patch Transdermal Q24H   losartan  25 mg Oral Daily   metoprolol succinate  50 mg  Oral Daily   polyethylene glycol  17 g Oral Daily   psyllium  1 packet Oral Daily   rosuvastatin  20 mg Oral Daily   senna-docusate  2 tablet Oral QHS   sodium chloride flush  10 mL Intracatheter Q12H   spironolactone  25 mg Oral Daily   torsemide  40 mg Oral Daily    Infusions:    PRN Medications: bisacodyl, chlorpheniramine-HYDROcodone, guaiFENesin, morphine injection, ondansetron (ZOFRAN) IV, mouth rinse, oxyCODONE, traZODone  Patient Profile   Mr Salmela is a 62 year old male with a history of CAD, HTN,  and HLD.    Presented w/ CP and had PEA arrest in the ED. ROSC ~ 25 minutes. TNK given for presumed PE. AHF team consulted for cardiogenic shock.   Assessment/Plan  1. Acute systolic  CHF/Cardiogenic shock: Shock now resolved.  S/p PEA arrest 11/7.  Received tenecteplase in ER given concern for massive PE in setting of PEA arrest but could this all be CHF with new AF/RVR?  HS-TnI rose to 2815 post- arrest.  Bedside echo (somewhat difficult study) with LV EF difficult to quantify but looks in 35% range, RV moderately dilated/moderately dysfunctional with D-shaped septum, IVC dilated.  Extubated on 11/8 and now off all pressors. Diuresed well w/ IV Lasix. 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 Repeat Echo with EF 40-45%, RV nl. - TEE 11/15 EF 35-40% Co-ox 65% - Volume status looks good. Continue Torsemide 40mg  PO. - Continue digoxin 0.125 - Continue Spironolactone 25 mg daily  - Continue losartan 25 mg daily.  - Continue metoprolol succinate 50 daily - Stable on current GDMT regimen. Would wait to transition to [facility] and start SGLT2i until post CABG   2. CAD: Known occlusion of OM1 in 2007.  HS-TnI 397 => 2815 post arrest, no STEMI on ECG.  Has received tenecteplase.  - LHC this admit severe 3v heavily calcified CAD with CTO of Ostial LCx and midRCA. Mid LAD 95%  - CTS consulted. Will proceed with CABG per Dr. Laneta Simmers first week of Dec. (12/2)  - Continue ASA/statin.  - No s/s angina   3. 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  Has had tenecteplase.  Lower extremity venous dopplers with no DVT.  Will be hard to definitively confirm PE diagnosis at this point, but will get long-term anticoagulation due to AF.  - doubt PE Suspect initial presentation was HF/AF  4. Atrial fibrillation: With RVR on admission, no prior history.  Converted to NSR on amiodarone initially but back in AF with RVR on 11/9.  - s/p TEE/DC-CV on 11/15.  - Remains in NSR - Continue amio 400 bid - Continue Eliquis 5mg  BID - Continue digoxin as above  5. Acute hypoxemic respiratory  failure: Extubated.  Stable on Aten for comfort - L pleural effusion noted on CXR, drained by CCM and repeat CXR with residual effusion noted - Resolved  6. ETOH abuse: Per brother, "1/2 gallon beer" daily.   7. Hyponatremia: Hypervolemic hyponatremia. Na improved w/ tolvaptan  - 130 today  8. Severe deconditioning - Lives in 2nd floor apt. Unable to get up steps.  - Agree with discharge to CIR for prehab prior to CABG  Stable from HF.CAD perspective. Plan d/c to CIR today on current meds.   Length of Stay: 17  Arvilla Meres, MD  12/19/2022, 10:30 AM  Advanced Heart Failure Team Pager [phone] (M-F; 7a - 5p)  Please contact [facility] for night-coverage after hours (5p -7a ) and weekends on [email or web address]

## 2022-12-19 NOTE — TOC Progression Note (Signed)
Transition of Care [facility]) - Progression Note    Patient Details  Name: Jeremiah Dixon MRN: [id number] Date of Birth: August 29, 1960  Transition of Care [facility]) CM/SW Contact  Elliot Cousin, RN Phone Number: [phone] 12/19/2022, 10:11 AM  Clinical Narrative:    Patient was independent pta at home alone. Patient will have IP rehab prior to CABG 12/31/2022.     Expected Discharge Plan: Home w Home Health Services Barriers to Discharge: No Barriers Identified  Expected Discharge Plan and Services   Discharge Planning Services: CM Consult Post Acute Care Choice: IP Rehab Living arrangements for the past 2 months: Apartment                                       Social Determinants of Health (SDOH) Interventions SDOH Screenings   Food Insecurity: No Food Insecurity (12/06/2022)  Housing: Low Risk  (12/06/2022)  Transportation Needs: Unmet Transportation Needs (12/06/2022)  Utilities: Not At Risk (12/06/2022)  Alcohol Screen: Medium Risk (12/06/2022)  Financial Resource Strain: Low Risk  (12/06/2022)  Tobacco Use: Medium Risk (12/16/2022)    Readmission Risk Interventions     No data to display

## 2022-12-19 NOTE — Progress Notes (Signed)
Occupational Therapy Treatment Patient Details Name: Jeremiah Dixon MRN: [id number] DOB: [birth year] Today's Date: 12/19/2022   History of present illness Pt is a 62 y/o male presenting to the ED on 11/7 with respiratory arrest, hypoxia. In ED became unresponsive, pulseless and CPR 25 min. Intubated 11/7-11/8.  TNK given for PE concern. 11/11 radial access heart cath. 11/15 dCCV. PMHx  CAD, HTN   OT comments  Pt with good progress toward established OT goals. Continues to benefit from cues for cognition/memory/executive function during ADL and IADL. Greater understanding of functional deficits as they relate to return to ADL and IADl, but needing cues to implement compensatory techniques and optimize safety. Able to follow 3 step commands during ADL with min cues today. Pt needing up to min A for ADL this session. Continue to highly recommend inpatient rehab >3 hours/day to optimize safety and independence in ADL and IADL.       If plan is discharge home, recommend the following:  A little help with walking and/or transfers;A little help with bathing/dressing/bathroom;Assistance with cooking/housework;Assist for transportation;Help with stairs or ramp for entrance;Supervision due to cognitive status   Equipment Recommendations  Tub/shower seat;Other (comment) (RW)    Recommendations for Other Services      Precautions / Restrictions Precautions Precautions: Fall       Mobility Bed Mobility               General bed mobility comments: OOB in chair on arrival    Transfers Overall transfer level: Needs assistance Equipment used: Rolling walker (2 wheels) Transfers: Sit to/from Stand Sit to Stand: Supervision           General transfer comment: verbal cues for optimal technique     Balance Overall balance assessment: Needs assistance Sitting-balance support: No upper extremity supported, Feet supported Sitting balance-Leahy Scale: Fair     Standing balance  support: Bilateral upper extremity supported, During functional activity, Reliant on assistive device for balance Standing balance-Leahy Scale: Fair Standing balance comment: decreased dynamic balance                           ADL either performed or assessed with clinical judgement   ADL Overall ADL's : Needs assistance/impaired     Grooming: Contact guard assist;Standing;Cueing for sequencing;Wash/dry hands;Wash/dry face;Oral care Grooming Details (indicate cue type and reason): performing 3 step task with min cues for recall and order of sequence. Pt did demo some use of compensatory techniques with self-initiated review of instruction with therapist prior to initiation of tasks, however, continued to need cues throughout sequence             Lower Body Dressing: Set up;Cueing for compensatory techniques;Sitting/lateral leans Lower Body Dressing Details (indicate cue type and reason): for socks Toilet Transfer: Minimal assistance;Ambulation;BSC/3in1;Rolling walker (2 wheels) Toilet Transfer Details (indicate cue type and reason): for cues for safety                Extremity/Trunk Assessment Upper Extremity Assessment Upper Extremity Assessment: Generalized weakness   Lower Extremity Assessment Lower Extremity Assessment: Defer to PT evaluation        Vision   Additional Comments: Pt with decreased contrast sensitivity noted when performing grooming tasks at sink. Of note glasses not donned as he was washing his face   Perception Perception Perception: Not tested   Praxis Praxis Praxis: Not tested    Cognition Arousal: Alert Behavior During Therapy: [facility] for tasks assessed/performed Overall  Cognitive Status: No family/caregiver present to determine baseline cognitive functioning Area of Impairment: Safety/judgement, Memory, Awareness, Problem solving, Following commands                     Memory: Decreased short-term memory Following  Commands: Follows one step commands consistently, Follows multi-step commands inconsistently (can follow up to 3 step commands, decresaed organization when asked to follow in order. Needing incresaed time and review of command 2-3 repetitions prior to initiation of following the command.) Safety/Judgement: Decreased awareness of deficits Awareness: Emergent Problem Solving: Slow processing General Comments: Pt very concerned about his activity tolerance and edurance. Processing and cognition continues to improve daily, but pt continues with decreased memory, organization, problem solving, executive function.        Exercises      Shoulder Instructions       General Comments      Pertinent Vitals/ Pain       Pain Assessment Pain Assessment: No/denies pain  Home Living                                          Prior Functioning/Environment              Frequency  Min 1X/week        Progress Toward Goals  OT Goals(current goals can now be found in the care plan section)  Progress towards OT goals: Progressing toward goals  Acute Rehab OT Goals Patient Stated Goal: get to rehab OT Goal Formulation: With patient Time For Goal Achievement: 12/23/22 Potential to Achieve Goals: Good ADL Goals Pt Will Perform Grooming: with supervision;standing Pt Will Perform Lower Body Dressing: with supervision;sit to/from stand Pt Will Transfer to Toilet: with supervision;ambulating;regular height toilet Additional ADL Goal #1: Pt will identify and implement 2+ energy conservation strategies. Additional ADL Goal #2: Pt will follow 3 step commands during ADL with min cues.  Plan      Co-evaluation                 AM-PAC OT "6 Clicks" Daily Activity     Outcome Measure   Help from another person eating meals?: None Help from another person taking care of personal grooming?: A Little Help from another person toileting, which includes using toliet, bedpan,  or urinal?: A Lot Help from another person bathing (including washing, rinsing, drying)?: A Lot Help from another person to put on and taking off regular upper body clothing?: A Little Help from another person to put on and taking off regular lower body clothing?: A Lot 6 Click Score: 16    End of Session Equipment Utilized During Treatment: Gait belt;Rolling walker (2 wheels)  OT Visit Diagnosis: Unsteadiness on feet (R26.81);Muscle weakness (generalized) (M62.81);Other symptoms and signs involving cognitive function;Other abnormalities of gait and mobility (R26.89)   Activity Tolerance Patient tolerated treatment well   Patient Left in chair;with call bell/phone within reach   Nurse Communication Mobility status        Time: 1610-9604 OT Time Calculation (min): 24 min  Charges: OT General Charges $OT Visit: 1 Visit OT Treatments $Self Care/Home Management : 23-37 mins  Tyler Deis, OTR/L [facility] Acute Rehabilitation Office: [phone]   Myrla Halsted 12/19/2022, 10:40 AM

## 2022-12-20 ENCOUNTER — Other Ambulatory Visit: Payer: Self-pay | Admitting: *Deleted

## 2022-12-20 DIAGNOSIS — R5381 Other malaise: Secondary | ICD-10-CM | POA: Diagnosis not present

## 2022-12-20 DIAGNOSIS — I2510 Atherosclerotic heart disease of native coronary artery without angina pectoris: Secondary | ICD-10-CM

## 2022-12-20 LAB — CBC WITH DIFFERENTIAL/PLATELET
Abs Immature Granulocytes: 0.03 10*3/uL (ref 0.00–0.07)
Basophils Absolute: 0 10*3/uL (ref 0.0–0.1)
Basophils Relative: 1 %
Eosinophils Absolute: 0.3 10*3/uL (ref 0.0–0.5)
Eosinophils Relative: 6 %
HCT: 30.3 % — ABNORMAL LOW (ref 39.0–52.0)
Hemoglobin: 10.2 g/dL — ABNORMAL LOW (ref 13.0–17.0)
Immature Granulocytes: 1 %
Lymphocytes Relative: 22 %
Lymphs Abs: 1.1 10*3/uL (ref 0.7–4.0)
MCH: 30.9 pg (ref 26.0–34.0)
MCHC: 33.7 g/dL (ref 30.0–36.0)
MCV: 91.8 fL (ref 80.0–100.0)
Monocytes Absolute: 0.7 10*3/uL (ref 0.1–1.0)
Monocytes Relative: 14 %
Neutro Abs: 2.8 10*3/uL (ref 1.7–7.7)
Neutrophils Relative %: 56 %
Platelets: 409 10*3/uL — ABNORMAL HIGH (ref 150–400)
RBC: 3.3 MIL/uL — ABNORMAL LOW (ref 4.22–5.81)
RDW: 13.2 % (ref 11.5–15.5)
WBC: 5 10*3/uL (ref 4.0–10.5)
nRBC: 0 % (ref 0.0–0.2)

## 2022-12-20 LAB — COMPREHENSIVE METABOLIC PANEL
ALT: 29 U/L (ref 0–44)
AST: 21 U/L (ref 15–41)
Albumin: 3.1 g/dL — ABNORMAL LOW (ref 3.5–5.0)
Alkaline Phosphatase: 72 U/L (ref 38–126)
Anion gap: 7 (ref 5–15)
BUN: 14 mg/dL (ref 8–23)
CO2: 29 mmol/L (ref 22–32)
Calcium: 9 mg/dL (ref 8.9–10.3)
Chloride: 95 mmol/L — ABNORMAL LOW (ref 98–111)
Creatinine, Ser: 0.77 mg/dL (ref 0.61–1.24)
GFR, Estimated: >60 mL/min (ref >60)
Glucose, Bld: 95 mg/dL (ref 70–99)
Potassium: 3.7 mmol/L (ref 3.5–5.1)
Sodium: 131 mmol/L — ABNORMAL LOW (ref 135–145)
Total Bilirubin: 1.3 mg/dL — ABNORMAL HIGH (ref <1.2)
Total Protein: 6.8 g/dL (ref 6.5–8.1)

## 2022-12-20 NOTE — Plan of Care (Signed)
Patient had a quite night after prn medications given (pain and sleep). Mild chest discomfort on coughing. Assisted with BR privileges. Remains alert and oriented. Looking forward to therapy session this morning. Safety maintained.

## 2022-12-20 NOTE — Evaluation (Signed)
Occupational Therapy Assessment and Plan  Patient Details  Name: Jeremiah Dixon MRN: [id number] Date of Birth: 1960/10/29  OT Diagnosis: acute pain, muscle weakness (generalized), and other lymphedema Rehab Potential: Rehab Potential (ACUTE ONLY): Good ELOS: 3-5 days   Today's Date: 12/20/2022 OT Individual Time: 0800-0920 OT Individual Time Calculation (min): 80 min     Hospital Problem: Principal Problem:   Debility   Past Medical History:  Past Medical History:  Diagnosis Date   HLD (hyperlipidemia)    HTN (hypertension)    MI (myocardial infarction) (HCC)    Past Surgical History:  Past Surgical History:  Procedure Laterality Date   CARDIOVERSION N/A 12/14/2022   Procedure: CARDIOVERSION (CATH LAB);  Surgeon: Dolores Patty, MD;  Location: [facility] INVASIVE CV LAB;  Service: Cardiovascular;  Laterality: N/A;   RIGHT HEART CATH N/A 12/06/2022   Procedure: RIGHT HEART CATH;  Surgeon: Laurey Morale, MD;  Location: [facility] INVASIVE CV LAB;  Service: Cardiovascular;  Laterality: N/A;   RIGHT/LEFT HEART CATH AND CORONARY ANGIOGRAPHY N/A 12/10/2022   Procedure: RIGHT/LEFT HEART CATH AND CORONARY ANGIOGRAPHY;  Surgeon: Dolores Patty, MD;  Location: [facility] INVASIVE CV LAB;  Service: Cardiovascular;  Laterality: N/A;   TRANSESOPHAGEAL ECHOCARDIOGRAM (CATH LAB) N/A 12/14/2022   Procedure: TRANSESOPHAGEAL ECHOCARDIOGRAM;  Surgeon: Dolores Patty, MD;  Location: [facility] INVASIVE CV LAB;  Service: Cardiovascular;  Laterality: N/A;    Assessment & Plan Clinical Impression: Jeremiah Dixon is a 62 year old male brought in by [facility] from home to the ED on 12/06/2022 with initial reports of callout for chest pain and shortness of breath. And route he increasingly became hypoxic, pale and cyanotic with subsequent respiratory arrest. On arrival CPR was started and ROSC was obtained after 22 minutes. PEA throughout ongoing CPR efforts. No STEMI on ECG. Intubated and admitted to critical  care medicine. Onset of atrial fibrillation with RVR. Cardiology consultation and echocardiogram obtained. Administered tenecteplase for presumed pulmonary embolism and subsequently diagnosed with cardiogenic shock and acute systolic congestive heart failure. Advanced heart failure team consulted. Bilateral lower extremity venous duplex exams with no DVT. Extubated on 11/08 and weaned off all vasopressors. Had DC/CV 11/15. He remains in normal sinus rhythm. On Eliquis 5 mg twice daily. He was extubated on 11/08 and diuresed. Underwent cardiac catheterization with severe three-vessel coronary artery disease noted. Cardiothoracic surgery consulted and plan for CABG early in December. Repeat echocardiogram with EF 40 to 45%, right ventricle normal. Did definitively difficult to confirm pulmonary embolism diagnosis but will continue on long-term anticoagulation due to atrial fibrillation. Suspect initial presentation was due to HF/AF. Underwent left thoracentesis for left pleural effusion with no residual effusion noted on follow-up chest x-ray. Tolerating diet. Gait distance of 300 feet with RW. The patient requires inpatient medicine and rehabilitation evaluations and services for ongoing dysfunction secondary to cardiac debility.  Patient transferred to CIR on 12/19/2022 .    Patient currently requires supervision with basic self-care skills and IADL secondary to muscle weakness, decreased cardiorespiratoy endurance, and decreased standing balance and decreased balance strategies.  Prior to hospitalization, patient could complete ADLs with independent .  Patient will benefit from skilled intervention to decrease level of assist with basic self-care skills, increase independence with basic self-care skills, and increase level of independence with iADL prior to discharge home independently.  Anticipate patient will require  no  and follow up outpatient.  OT - End of Session Activity Tolerance: Tolerates 30+ min  activity with multiple rests Endurance Deficit: Yes OT  Assessment Rehab Potential (ACUTE ONLY): Good OT Patient demonstrates impairments in the following area(s): Balance;Edema;Endurance;Pain;Safety OT Basic ADL's Functional Problem(s): Grooming;Bathing;Dressing;Toileting OT Advanced ADL's Functional Problem(s): Light Housekeeping OT Transfers Functional Problem(s): Toilet;Tub/Shower OT Additional Impairment(s): None OT Plan OT Intensity: Minimum of 1-2 x/day, 45 to 90 minutes OT Frequency: 5 out of 7 days OT Duration/Estimated Length of Stay: 3-5 days OT Treatment/Interventions: Balance/vestibular training;Discharge planning;Pain management;Self Care/advanced ADL retraining;Therapeutic Activities;Disease mangement/prevention;Functional mobility training;Patient/family education;Skin care/wound managment;Therapeutic Exercise;Community reintegration;DME/adaptive equipment instruction;Neuromuscular re-education;Psychosocial support;UE/LE Strength taining/ROM;Splinting/orthotics OT Self Feeding Anticipated Outcome(s): mod I OT Basic Self-Care Anticipated Outcome(s): mod I OT Toileting Anticipated Outcome(s): mod I OT Bathroom Transfers Anticipated Outcome(s): mod I OT Recommendation Recommendations for Other Services: Therapeutic Recreation consult Therapeutic Recreation Interventions: Pet therapy;Kitchen group Patient destination: Home Follow Up Recommendations: Outpatient OT Equipment Recommended: To be determined   OT Evaluation Precautions/Restrictions  Precautions Precautions: Fall;Sternal Precaution Comments: has sternal pillow Restrictions Weight Bearing Restrictions: No General Chart Reviewed: Yes Response to Previous Treatment: Not applicable Family/Caregiver Present: No Pain Pain Assessment Pain Scale: 0-10 Pain Score: 1  Pain Type: Acute pain Pain Location: Back Pain Orientation: Mid Pain Descriptors / Indicators: Aching Pain Frequency: Constant Pain  Intervention(s): Medication (See eMAR) Home Living/Prior Functioning Home Living Family/patient expects to be discharged to:: Private residence Living Arrangements: Alone Available Help at Discharge: Available PRN/intermittently (brother nearby) Type of Home: Apartment Home Access: Stairs to enter [employer] of Steps: 10 + landing + 10 -  bilateral rails on first flight and landing but on 2nd flight there is only 1 rail on R side Entrance Stairs-Rails: Right, Left Home Layout: Other (Comment) (lives on second floor of apartment building with flight of steps to his unit, has bilateral railings on first set of steps then railing on right for second set of steps) Bathroom Shower/Tub: Tub/shower unit, Development worker, community Accessibility: No Additional Comments: pleasant and motivated  Lives With: Alone IADL History Homemaking Responsibilities: Yes Meal Prep Responsibility: Primary Laundry Responsibility: Primary (has to walk about quarter of a mile to laundry) Cleaning Responsibility: Primary Bill Paying/Finance Responsibility: Primary Shopping Responsibility: Primary Child Care Responsibility: No Current License: No Mode of Transportation: Bus Occupation: Part time employment Type of Occupation: part time at [employer] Leisure and Hobbies: reading, music, cooking Prior Function Level of Independence: Independent with basic ADLs, Independent with transfers, Independent with gait, Independent with homemaking with ambulation  Able to Take Stairs?: Yes Driving: No Vocation: Part time employment Vocation Requirements: works at [employer] Leisure: Hobbies-yes (Comment) (see above) Vision Baseline Vision/History: 1 Wears glasses Ability to See in Adequate Light: 0 Adequate Patient Visual Report: No change from baseline Vision Assessment?: No apparent visual deficits Perception  Perception: Within Functional Limits Praxis Praxis:  WFL Cognition Cognition Overall Cognitive Status: Within Functional Limits for tasks assessed Arousal/Alertness: Awake/alert Memory: Appears intact Focused Attention: Appears intact Sustained Attention: Appears intact Awareness: Appears intact Problem Solving: Appears intact Safety/Judgment: Appears intact Brief Interview for Mental Status (BIMS) Repetition of Three Words (First Attempt): 3 Temporal Orientation: Year: Correct Temporal Orientation: Month: Accurate within 5 days Temporal Orientation: Day: Correct Recall: "Sock": Yes, no cue required Recall: "Blue": Yes, no cue required Recall: "Bed": Yes, no cue required BIMS Summary Score: 15 Sensation Sensation Light Touch: Appears Intact Hot/Cold: Appears Intact Proprioception: Appears Intact Stereognosis: Not tested Coordination Gross Motor Movements are Fluid and Coordinated: No Fine Motor Movements are Fluid and Coordinated: Yes Coordination and Movement Description: generalized weakness Motor  Motor Motor: Within Functional Limits  Trunk/Postural  Assessment  Cervical Assessment Cervical Assessment: Within Functional Limits Thoracic Assessment Thoracic Assessment: Within Functional Limits Lumbar Assessment Lumbar Assessment: Within Functional Limits Postural Control Postural Control: Within Functional Limits  Balance Balance Balance Assessed: Yes Standardized Balance Assessment Standardized Balance Assessment: Berg Balance Test Berg Balance Test Sit to Stand: Able to stand without using hands and stabilize independently Standing Unsupported: Able to stand safely 2 minutes Sitting with Back Unsupported but Feet Supported on Floor or Stool: Able to sit safely and securely 2 minutes Stand to Sit: Sits safely with minimal use of hands Transfers: Able to transfer safely, minor use of hands Standing Unsupported with Eyes Closed: Able to stand 10 seconds safely Standing Ubsupported with Feet Together: Able to place  feet together independently and stand 1 minute safely From Standing, Reach Forward with Outstretched Arm: Can reach forward >12 cm safely (5") From Standing Position, Pick up Object from Floor: Able to pick up shoe, needs supervision From Standing Position, Turn to Look Behind Over each Shoulder: Looks behind from both sides and weight shifts well Turn 360 Degrees: Able to turn 360 degrees safely but slowly Standing Unsupported, Alternately Place Feet on Step/Stool: Able to stand independently and safely and complete 8 steps in 20 seconds Standing Unsupported, One Foot in Front: Able to take small step independently and hold 30 seconds Standing on One Leg: Able to lift leg independently and hold 5-10 seconds Total Score: 49 Static Sitting Balance Static Sitting - Balance Support: Feet supported;No upper extremity supported Static Sitting - Level of Assistance: 7: Independent Dynamic Sitting Balance Dynamic Sitting - Balance Support: Feet supported;No upper extremity supported Dynamic Sitting - Level of Assistance: 7: Independent Dynamic Sitting - Balance Activities: Reaching for objects;Forward lean/weight shifting Static Standing Balance Static Standing - Balance Support: No upper extremity supported;During functional activity Static Standing - Level of Assistance: 5: Stand by assistance Dynamic Standing Balance Dynamic Standing - Balance Support: No upper extremity supported;During functional activity Dynamic Standing - Level of Assistance: 5: Stand by assistance Dynamic Standing - Comments: during ADLs and standing in shower Extremity/Trunk Assessment RUE Assessment RUE Assessment: Within Functional Limits Active Range of Motion (AROM) Comments: WFL General Strength Comments: 5/5 overall LUE Assessment LUE Assessment: Within Functional Limits Active Range of Motion (AROM) Comments: WFL General Strength Comments: 5/5 overall  Care Tool Care Tool Self Care Eating   Eating Assist  Level: Set up assist    Oral Care    Oral Care Assist Level: Supervision/Verbal cueing    Bathing   Body parts bathed by patient: Right arm;Face;Left arm;Left lower leg;Chest;Abdomen;Front perineal area;Right upper leg;Buttocks;Left upper leg;Right lower leg     Assist Level: Supervision/Verbal cueing    Upper Body Dressing(including orthotics)   What is the patient wearing?: Pull over shirt   Assist Level: Supervision/Verbal cueing    Lower Body Dressing (excluding footwear)     Assist for lower body dressing: Supervision/Verbal cueing    Putting on/Taking off footwear   What is the patient wearing?: Socks;Ted hose Assist for footwear: Supervision/Verbal cueing       Care Tool Toileting Toileting activity   Assist for toileting: Supervision/Verbal cueing     Care Tool Bed Mobility Roll left and right activity   Roll left and right assist level: Supervision/Verbal cueing    Sit to lying activity   Sit to lying assist level: Supervision/Verbal cueing    Lying to sitting on side of bed activity   Lying to sitting on side of bed assist level:  the ability to move from lying on the back to sitting on the side of the bed with no back support.: Supervision/Verbal cueing     Care Tool Transfers Sit to stand transfer   Sit to stand assist level: Supervision/Verbal cueing    Chair/bed transfer   Chair/bed transfer assist level: Supervision/Verbal cueing     Toilet transfer   Assist Level: Supervision/Verbal cueing     Care Tool Cognition  Expression of Ideas and Wants Expression of Ideas and Wants: 4. Without difficulty (complex and basic) - expresses complex messages without difficulty and with speech that is clear and easy to understand  Understanding Verbal and Non-Verbal Content Understanding Verbal and Non-Verbal Content: 4. Understands (complex and basic) - clear comprehension without cues or repetitions   Memory/Recall Ability Memory/Recall Ability : Current  season;That he or she is in a hospital/hospital unit   Refer to Care Plan for Long Term Goals  SHORT TERM GOAL WEEK 1 OT Short Term Goal 1 (Week 1): STG=LTG d/t ELOS  Recommendations for other services: Therapeutic Recreation  Pet therapy and Kitchen group   Skilled Therapeutic Intervention ADL ADL Eating: Set up Grooming: Supervision/safety Where Assessed-Grooming: Standing at sink Upper Body Bathing: Supervision/safety Where Assessed-Upper Body Bathing: Shower Lower Body Bathing: Supervision/safety Where Assessed-Lower Body Bathing: Shower Upper Body Dressing: Supervision/safety Where Assessed-Upper Body Dressing: Standing at sink Lower Body Dressing: Supervision/safety Where Assessed-Lower Body Dressing: Standing at sink Toileting: Supervision/safety Where Assessed-Toileting: Teacher, adult education: Close supervision Toilet Transfer Method: Ambulating Tub/Shower Transfer: Close supervison Tub/Shower Transfer Method: Ship broker: Acupuncturist: Close supervision Film/video editor Method: Designer, industrial/product: Grab bars ADL Comments: Pt able to stand while completing whole shower using grab bar when standing on one leg to wash feet, able to dress while standing while holding onto wall for balance without LOB/SOB. Pt able to don TEDs and socks using figure 4 position seated in chair. Pt required reminders to take rest breaks for activity pacing. Pt able to stand while grooming at sink. Mobility  Transfers Sit to Stand: Supervision/Verbal cueing Stand to Sit: Supervision/Verbal cueing Stand pivot transfers: supervision/ verbal cueing  1:1 evaluation and treatment session initiated this date. OT roles, goals and purpose discussed with pt as well as therapy schedule. ADL completed this date with levels of assist listed above as well as techniques used and VC required. Pt following good sternal precautions. Nursing  removed unna boots prior to bathing and had verbal consent from doctor to use TEDs after bathing. Pt would benefit from skilled OT in IPR setting in order to maximize independence with ADLs upon D/C.    Discharge Criteria: Patient will be discharged from OT if patient refuses treatment 3 consecutive times without medical reason, if treatment goals not met, if there is a change in medical status, if patient makes no progress towards goals or if patient is discharged from hospital.  The above assessment, treatment plan, treatment alternatives and goals were discussed and mutually agreed upon: by patient  Velia Meyer, OTD, OTR/L 12/20/2022, 4:12 PM

## 2022-12-20 NOTE — Progress Notes (Signed)
[facility] Individual Statement of Services  Patient Name:  Jeremiah Dixon  Date:  12/20/2022  Welcome to the [facility].  Our goal is to provide you with an individualized program based on your diagnosis and situation, designed to meet your specific needs.  With this comprehensive rehabilitation program, you will be expected to participate in at least 3 hours of rehabilitation therapies Monday-Friday, with modified therapy programming on the weekends.  Your rehabilitation program will include the following services:  Physical Therapy (PT), Occupational Therapy (OT), 24 hour per day rehabilitation nursing, Therapeutic Recreaction (TR), Neuropsychology, Care Coordinator, Rehabilitation Medicine, Nutrition Services, and Pharmacy Services  Weekly team conferences will be held on Tuesday to discuss your progress.  Your Inpatient Rehabilitation Care Coordinator will talk with you frequently to get your input and to update you on team discussions.  Team conferences with you and your family in attendance may also be held.  Expected length of stay: 3-5 days  Overall anticipated outcome: independent level  Depending on your progress and recovery, your program may change. Your Inpatient Rehabilitation Care Coordinator will coordinate services and will keep you informed of any changes. Your Inpatient Rehabilitation Care Coordinator's name and contact numbers are listed  below.  The following services may also be recommended but are not provided by the Inpatient Rehabilitation Center:   Home Health Rehabiltiation Services Outpatient Rehabilitation Services Vocational Rehabilitation   Arrangements will be made to provide these services after discharge if needed.  Arrangements include referral to agencies that provide these services.  Your insurance has been verified to be:  [place] Your primary doctor is:  Barry Brunner  Pertinent information will be  shared with your doctor and your insurance company.  Inpatient Rehabilitation Care Coordinator:  Dossie Der, [place] [phone] or [phone]  Information discussed with and copy given to patient by: Lucy Chris, 12/20/2022, 11:13 AM

## 2022-12-20 NOTE — Progress Notes (Signed)
Inpatient Rehabilitation Care Coordinator Assessment and Plan Patient Details  Name: Jeremiah Dixon MRN: [id number] Date of Birth: 02-08-1960  Today's Date: 12/20/2022  Hospital Problems: Principal Problem:   Debility  Past Medical History:  Past Medical History:  Diagnosis Date   HLD (hyperlipidemia)    HTN (hypertension)    MI (myocardial infarction) [facility])    Past Surgical History:  Past Surgical History:  Procedure Laterality Date   CARDIOVERSION N/A 12/14/2022   Procedure: CARDIOVERSION (CATH LAB);  Surgeon: Dolores Patty, MD;  Location: [facility] INVASIVE CV LAB;  Service: Cardiovascular;  Laterality: N/A;   RIGHT HEART CATH N/A 12/06/2022   Procedure: RIGHT HEART CATH;  Surgeon: Laurey Morale, MD;  Location: [facility] INVASIVE CV LAB;  Service: Cardiovascular;  Laterality: N/A;   RIGHT/LEFT HEART CATH AND CORONARY ANGIOGRAPHY N/A 12/10/2022   Procedure: RIGHT/LEFT HEART CATH AND CORONARY ANGIOGRAPHY;  Surgeon: Dolores Patty, MD;  Location: [facility] INVASIVE CV LAB;  Service: Cardiovascular;  Laterality: N/A;   TRANSESOPHAGEAL ECHOCARDIOGRAM (CATH LAB) N/A 12/14/2022   Procedure: TRANSESOPHAGEAL ECHOCARDIOGRAM;  Surgeon: Dolores Patty, MD;  Location: [facility] INVASIVE CV LAB;  Service: Cardiovascular;  Laterality: N/A;   Social History:  reports that he has quit smoking. His smoking use included cigarettes. He has never used smokeless tobacco. He reports current alcohol use of about 8.0 standard drinks of alcohol per week. He reports that he does not use drugs.  Family / Support Systems Marital Status: Single Patient Roles: Other (Comment) (brother and uncle) Other Supports: Bill-brother [phone]  Sharilyn Sites [phone] Anticipated Caregiver: Self Ability/Limitations of Caregiver: Borther can check on him but not assist with his care. He will need to be mod/i to be able to go home Caregiver Availability: Intermittent Family Dynamics: Close with brother who is local and he  has two nieces but both out of town. 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 He is high level and will not be here long   Abuse/Neglect Abuse/Neglect Assessment Can Be Completed: Yes Physical Abuse: Denies Verbal Abuse: Denies Sexual Abuse: Denies Exploitation of patient/patient's resources: Denies Self-Neglect: Denies  Patient response to: Social Isolation - How often do you feel lonely or isolated from those around you?: Never  Emotional Status Pt's affect, behavior and adjustment status: Pt is still processing all that has happened to him since his admission. 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 He does talk with the MD involved and feels has a good understanding of his treatment plan moving forward. Recent Psychosocial Issues: Has some health issues but felt were managable and has never had anything such as this-SOB Psychiatric History: No history may benefit from seeing neuro-psych while here due to prepare mentally for his surgery Substance Abuse History: No issues  Patient / Family Perceptions, Expectations & Goals Pt/Family understanding of illness & functional limitations: Pt can explain his heart issues and they all came as a surprise to him and felt at one point he would not make it. He  is doing well now and feels more confident in his abilities now Premorbid pt/family roles/activities: employee, brother uncle, neighbor, friend  etc Anticipated changes in roles/activities/participation: resume Pt/family expectations/goals: Pt states; " I hope to do well I have too. I have to rely on myself."  [employer]: None Premorbid Home Care/DME Agencies: None Transportation available at discharge: uses the bus Is the patient able to respond to transportation needs?: Yes In the past 12 months, has lack of transportation kept you from medical appointments or from getting medications?: No In the past 12 months, has lack of transportation kept you from meetings, work, or from getting things needed for daily living?: No Resource referrals recommended: Neuropsychology  Discharge Planning Living Arrangements: Alone Support Systems: Other relatives, Friends/neighbors Type of Residence: Private residence Insurance Resources: Media planner (specify) Counselling psychologist) Financial Resources: Employment, Restaurant manager, fast food Screen Referred: No Living Expenses: Psychologist, sport and exercise Management: Patient Does the patient have any problems obtaining your medications?: No Home Management: self Patient/Family Preliminary Plans: Return home with his brother checking on him, he will need to be mod/i level and be able to walk to the bus stop and up two flights of stairs to his apartment. Aware being evaluated to day and goals being set for stay here. Pt is high levle and will be short LOS Care Coordinator Barriers to Discharge: Decreased caregiver support, Lack of/limited family support Care Coordinator Anticipated Follow Up Needs: HH/OP  Clinical Impression Pleasant gentleman who is motivated to get stronger so he will be prepared for is surgery 12/2. Has limited supports via brother he can noly check on him at home. Will await team's evaluations and work on discharge needs. Have placed on neuro-psych list to be seen while here  Moon Budde, Lemar Livings 12/20/2022, 11:09 AM

## 2022-12-20 NOTE — Plan of Care (Signed)
  Problem: RH Grooming Goal: LTG Patient will perform grooming w/assist,cues/equip (OT) Description: LTG: Patient will perform grooming with assist, with/without cues using equipment (OT) Flowsheets (Taken 12/20/2022 1601) LTG: Pt will perform grooming with assistance level of: Independent with assistive device    Problem: RH Bathing Goal: LTG Patient will bathe all body parts with assist levels (OT) Description: LTG: Patient will bathe all body parts with assist levels (OT) Flowsheets (Taken 12/20/2022 1601) LTG: Pt will perform bathing with assistance level/cueing: Independent with assistive device    Problem: RH Dressing Goal: LTG Patient will perform upper body dressing (OT) Description: LTG Patient will perform upper body dressing with assist, with/without cues (OT). Flowsheets (Taken 12/20/2022 1601) LTG: Pt will perform upper body dressing with assistance level of: Independent with assistive device Goal: LTG Patient will perform lower body dressing w/assist (OT) Description: LTG: Patient will perform lower body dressing with assist, with/without cues in positioning using equipment (OT) Flowsheets (Taken 12/20/2022 1601) LTG: Pt will perform lower body dressing with assistance level of: Independent with assistive device   Problem: RH Toileting Goal: LTG Patient will perform toileting task (3/3 steps) with assistance level (OT) Description: LTG: Patient will perform toileting task (3/3 steps) with assistance level (OT)  Flowsheets (Taken 12/20/2022 1601) LTG: Pt will perform toileting task (3/3 steps) with assistance level: Independent with assistive device   Problem: RH Light Housekeeping Goal: LTG Patient will perform light housekeeping w/assist (OT) Description: LTG: Patient will perform light housekeeping with assistance, with/without cues (OT). Flowsheets (Taken 12/20/2022 1601) LTG: Pt will perform light housekeeping with assistance level of: Independent with assistive  device   Problem: RH Toilet Transfers Goal: LTG Patient will perform toilet transfers w/assist (OT) Description: LTG: Patient will perform toilet transfers with assist, with/without cues using equipment (OT) Flowsheets (Taken 12/20/2022 1601) LTG: Pt will perform toilet transfers with assistance level of: Independent with assistive device   Problem: RH Tub/Shower Transfers Goal: LTG Patient will perform tub/shower transfers w/assist (OT) Description: LTG: Patient will perform tub/shower transfers with assist, with/without cues using equipment (OT) Flowsheets (Taken 12/20/2022 1601) LTG: Pt will perform tub/shower stall transfers with assistance level of: Independent with assistive device

## 2022-12-20 NOTE — Evaluation (Signed)
Physical Therapy Assessment and Plan  Patient Details  Name: Jeremiah Dixon MRN: [id number] Date of Birth: 29-Sep-1960  PT Diagnosis: Abnormality of gait, Coordination disorder, Difficulty walking, Muscle weakness, and Pain in chest when coughing Rehab Potential: Good ELOS: 3-5 days   Today's Date: 12/20/2022 PT Individual Time: 8413-2440 PT Individual Time Calculation (min): 71 min    Hospital Problem: Principal Problem:   Debility   Past Medical History:  Past Medical History:  Diagnosis Date   HLD (hyperlipidemia)    HTN (hypertension)    MI (myocardial infarction) (HCC)    Past Surgical History:  Past Surgical History:  Procedure Laterality Date   CARDIOVERSION N/A 12/14/2022   Procedure: CARDIOVERSION (CATH LAB);  Surgeon: Dolores Patty, MD;  Location: [facility] INVASIVE CV LAB;  Service: Cardiovascular;  Laterality: N/A;   RIGHT HEART CATH N/A 12/06/2022   Procedure: RIGHT HEART CATH;  Surgeon: Laurey Morale, MD;  Location: [facility] INVASIVE CV LAB;  Service: Cardiovascular;  Laterality: N/A;   RIGHT/LEFT HEART CATH AND CORONARY ANGIOGRAPHY N/A 12/10/2022   Procedure: RIGHT/LEFT HEART CATH AND CORONARY ANGIOGRAPHY;  Surgeon: Dolores Patty, MD;  Location: [facility] INVASIVE CV LAB;  Service: Cardiovascular;  Laterality: N/A;   TRANSESOPHAGEAL ECHOCARDIOGRAM (CATH LAB) N/A 12/14/2022   Procedure: TRANSESOPHAGEAL ECHOCARDIOGRAM;  Surgeon: Dolores Patty, MD;  Location: [facility] INVASIVE CV LAB;  Service: Cardiovascular;  Laterality: N/A;    Assessment & Plan Clinical Impression: Patient is a 62 y.o. year old male brought in by [facility] from home to the ED on 12/06/2022 with initial reports of callout for chest pain and shortness of breath. And route he increasingly became hypoxic, pale and cyanotic with subsequent respiratory arrest. On arrival CPR was started and ROSC was obtained after 22 minutes. PEA throughout ongoing CPR efforts. No STEMI on ECG. Intubated and admitted  to critical care medicine. Onset of atrial fibrillation with RVR. Cardiology consultation and echocardiogram obtained. Administered tenecteplase for presumed pulmonary embolism and subsequently diagnosed with cardiogenic shock and acute systolic congestive heart failure. Advanced heart failure team consulted. Bilateral lower extremity venous duplex exams with no DVT. Extubated on 11/08 and weaned off all vasopressors. Had DC/CV 11/15. He remains in normal sinus rhythm. On Eliquis 5 mg twice daily. He was extubated on 11/08 and diuresed. Underwent cardiac catheterization with severe three-vessel coronary artery disease noted. Cardiothoracic surgery consulted and plan for CABG early in December. Repeat echocardiogram with EF 40 to 45%, right ventricle normal. Did definitively difficult to confirm pulmonary embolism diagnosis but will continue on long-term anticoagulation due to atrial fibrillation. Suspect initial presentation was due to HF/AF. Underwent left thoracentesis for left pleural effusion with no residual effusion noted on follow-up chest x-ray. Tolerating diet. Gait distance of 300 feet with RW. The patient requires inpatient medicine and rehabilitation evaluations and services for ongoing dysfunction secondary to cardiac debility.   Patient currently requires supervision with mobility secondary to muscle weakness, decreased cardiorespiratoy endurance, and decreased standing balance, decreased postural control, decreased balance strategies, and difficulty maintaining precautions.  Prior to hospitalization, patient was independent  with mobility and lived with Alone in a Apartment home.  Home access is 10 + landing + 10 -  bilateral rails on first flight and landing but on 2nd flight there is only 1 rail on R sideStairs to enter.  Patient will benefit from skilled PT intervention to maximize safe functional mobility, minimize fall risk, and decrease caregiver burden for planned discharge home alone.   Anticipate patient will  benefit from follow up OP at discharge.  PT - End of Session Activity Tolerance: Tolerates 30+ min activity with multiple rests Endurance Deficit: Yes Endurance Deficit Description: required frequent rest breaks throughout session PT Assessment Rehab Potential (ACUTE/IP ONLY): Good PT Barriers to Discharge: Inaccessible home environment;Decreased caregiver support;Home environment access/layout PT Barriers to Discharge Comments: 2 flights of steps to enter apartment and lives alone PT Patient demonstrates impairments in the following area(s): Balance;Endurance;Pain PT Transfers Functional Problem(s): Bed Mobility;Bed to Chair;Car;Furniture PT Locomotion Functional Problem(s): Ambulation;Stairs;Wheelchair Mobility PT Plan PT Intensity: Minimum of 1-2 x/day ,45 to 90 minutes PT Frequency: 5 out of 7 days PT Duration Estimated Length of Stay: 3-5 days PT Treatment/Interventions: Ambulation/gait training;Discharge planning;Functional mobility training;Psychosocial support;Therapeutic Activities;Balance/vestibular training;Disease management/prevention;Neuromuscular re-education;Skin care/wound management;Therapeutic Exercise;DME/adaptive equipment instruction;Pain management;UE/LE Strength taining/ROM;Community reintegration;Patient/family education;Stair training;UE/LE Coordination activities PT Transfers Anticipated Outcome(s): Independent PT Locomotion Anticipated Outcome(s): Independent PT Recommendation Follow Up Recommendations: Outpatient PT (/cardiac rehab) Patient destination: Home Equipment Recommended: To be determined Equipment Details: has none   PT Evaluation Precautions/Restrictions Precautions Precautions: Fall;Sternal Precaution Comments: has sternal pillow Restrictions Weight Bearing Restrictions: No Pain Interference Pain Interference Pain Effect on Sleep: 1. Rarely or not at all Pain Interference with Therapy Activities: 1. Rarely or not at  all Pain Interference with Day-to-Day Activities: 1. Rarely or not at all Home Living/Prior Functioning Home Living Living Arrangements: Alone Available Help at Discharge: Available PRN/intermittently (brother nearby) Type of Home: Apartment Home Access: Stairs to enter [employer] of Steps: 10 + landing + 10 -  bilateral rails on first flight and landing but on 2nd flight there is only 1 rail on R side Entrance Stairs-Rails: Right;Left Home Layout: Other (Comment) (lives on second floor of apartment building with flight of steps to his unit, has bilateral railings on first set of steps then railing on right for second set of steps) Bathroom Shower/Tub: Tub/shower unit;Curtain Insurance claims handler Accessibility: No Additional Comments: pleasant and motivated  Lives With: Alone Prior Function Level of Independence: Independent with basic ADLs;Independent with transfers;Independent with gait;Independent with homemaking with ambulation  Able to Take Stairs?: Yes Driving: No Vocation: Part time employment Vocation Requirements: works at [employer] Leisure: Hobbies-yes (Comment) (see above) Vision/Perception  Vision - History Ability to See in Adequate Light: 0 Adequate Perception Perception: Within Functional Limits Praxis Praxis: WFL  Cognition Overall Cognitive Status: Within Functional Limits for tasks assessed Arousal/Alertness: Awake/alert Orientation Level: Oriented X4 Focused Attention: Appears intact Sustained Attention: Appears intact Memory: Appears intact Awareness: Appears intact Problem Solving: Appears intact Safety/Judgment: Appears intact Sensation Sensation Light Touch: Appears Intact Hot/Cold: Appears Intact Proprioception: Appears Intact Stereognosis: Not tested Coordination Gross Motor Movements are Fluid and Coordinated: Yes Fine Motor Movements are Fluid and Coordinated: No Coordination and Movement Description: generalized  weakness Finger Nose Finger Test: slow bilaterally Heel Shin Test: WFL bilaterally Motor  Motor Motor: Within Functional Limits  Trunk/Postural Assessment  Cervical Assessment Cervical Assessment: Within Functional Limits Thoracic Assessment Thoracic Assessment: Within Functional Limits Lumbar Assessment Lumbar Assessment: Within Functional Limits Postural Control Postural Control: Within Functional Limits  Balance Balance Balance Assessed: Yes Static Sitting Balance Static Sitting - Balance Support: Feet supported;No upper extremity supported Static Sitting - Level of Assistance: 7: Independent Dynamic Sitting Balance Dynamic Sitting - Balance Support: Feet supported;No upper extremity supported Dynamic Sitting - Level of Assistance: 7: Independent Static Standing Balance Static Standing - Balance Support: No upper extremity supported;During functional activity Static Standing - Level of Assistance: 5: Stand by assistance (supervision)  Dynamic Standing Balance Dynamic Standing - Balance Support: No upper extremity supported;During functional activity Dynamic Standing - Level of Assistance: 5: Stand by assistance (supervision) Dynamic Standing - Comments: with transfers and gait Extremity Assessment  RLE Assessment RLE Assessment: Exceptions to [facility] General Strength Comments: tested sitting in recliner RLE Strength RLE Overall Strength Comments: grossly generalized to 4+/5 LLE Assessment LLE Assessment: Exceptions to [facility] General Strength Comments: tested sitting in recliner LLE Strength LLE Overall Strength Comments: grossly generalized to 4+/5  Care Tool Care Tool Bed Mobility Roll left and right activity        Sit to lying activity        Lying to sitting on side of bed activity         Care Tool Transfers Sit to stand transfer   Sit to stand assist level: Supervision/Verbal cueing    Chair/bed transfer   Chair/bed transfer assist level:  Supervision/Verbal cueing    Car transfer   Car transfer assist level: Supervision/Verbal cueing      Care Tool Locomotion Ambulation   Assist level: Supervision/Verbal cueing Assistive device: No Device Max distance: >146ft  Walk 10 feet activity   Assist level: Supervision/Verbal cueing Assistive device: No Device   Walk 50 feet with 2 turns activity   Assist level: Supervision/Verbal cueing Assistive device: No Device  Walk 150 feet activity   Assist level: Supervision/Verbal cueing Assistive device: No Device  Walk 10 feet on uneven surfaces activity   Assist level: Supervision/Verbal cueing    Stairs   Assist level: Contact Guard/Touching assist Stairs assistive device: 2 hand rails Max number of stairs: 12 (6in)  Walk up/down 1 step activity   Walk up/down 1 step (curb) assist level: Contact Guard/Touching assist Walk up/down 1 step or curb assistive device: 2 hand rails  Walk up/down 4 steps activity   Walk up/down 4 steps assist level: Contact Guard/Touching assist Walk up/down 4 steps assistive device: 2 hand rails  Walk up/down 12 steps activity   Walk up/down 12 steps assist level: Contact Guard/Touching assist Walk up/down 12 steps assistive device: 2 hand rails  Pick up small objects from floor   Pick up small object from the floor assist level: Supervision/Verbal cueing Pick up small object from the floor assistive device: no AD  Wheelchair Is the patient using a wheelchair?: No   Wheelchair activity did not occur: N/A      Wheel 50 feet with 2 turns activity Wheelchair 50 feet with 2 turns activity did not occur: N/A    Wheel 150 feet activity Wheelchair 150 feet activity did not occur: N/A      Refer to Care Plan for Long Term Goals  SHORT TERM GOAL WEEK 1 PT Short Term Goal 1 (Week 1): STG=LTG due to LOS  Recommendations for other services: None   Skilled Therapeutic Intervention Evaluation completed (see details above and below) with  education on PT POC and goals and individual treatment initiated with focus on functional mobility/transfers, generalized strengthening and endurance, dynamic standing balance/coordination, simulated car transfers, stair navigation, and ambulation. Received pt sitting in recliner, pt educated on PT evaluation, CIR policies, and therapy schedule and agreeable. Pt denied any pain during session. Educated pt on sternal precautions and demo good adherence to sternal precautions throughout session.   Pt performed all transfers without AD and close supervision throughout session. Pt ambulated in/out of bathroom without AD and close supervision x 2 trials throughout session. Pt able to manage clothing and void while  standing with distant supervision and performed hand hygiene standing at sink with supervision. Pt then ambulated >318ft without AD and close supervision to ortho gym. Pt performed ambulatory simulated car transfer without AD and supervision (cues to avoid pulling on grab bar), then ambulated 33ft on uneven surfaces (ramp and mulch) and navigated 1 5in curb without AD and close supervision. Pt able to stand and pick up object from floor with supervision and no LOB. Pt then ambulated 130ft without AD and supervision to main therapy gym and navigated 12 6in steps with intermittent support on 1 handrail and close supervision ascending with a step through and descending with a step to pattern.   Ambulated to stairwell and pt navigated additional 11 steps with 1 handrail and CGA (due to fatigue) ascending forwards with a step to pattern and descending with 2 handrails and CGA with a step to pattern. Pt required frequent seated rest breaks throughout session due to fatigue - educated on how to read pulse oximeter and encouraged pt to keep SPO2 >90% and HR between 50-120bpm and how to recognize signs/symptoms of fatigue and energy conservation strategies to implement.   Pt performed standing alternating toe taps  to 3in step without UE support and supervision 2x20. Pt then performed mini squats 2x10 to fatigue with emphasis on quad strength. Pt reported 8/10 fatigue and starting to feel "tapped out" therefore transitioned to seated exercises and performed knee extensions x15 bilaterally with 2.5lb ankle weight and hip flexion x15 bilaterally with 2.5lb ankle weight. Pt ambulated 49ft without AD and supervision back to room. Concluded session with pt sitting in recliner with all needs within reach and safety plan updated.   Mobility Transfers Transfers: Sit to Stand;Stand to Sit;Stand Pivot Transfers Sit to Stand: Supervision/Verbal cueing Stand to Sit: Supervision/Verbal cueing Stand Pivot Transfers: Supervision/Verbal cueing Transfer (Assistive device): None Locomotion  Gait Ambulation: Yes Gait Assistance: Supervision/Verbal cueing Gait Distance (Feet): 300 Feet Assistive device: None Gait Assistance Details: Verbal cues for gait pattern Gait Assistance Details: verbal cues for reciprocal arm swing Gait Gait: Yes Gait Pattern: Impaired Gait Pattern: Step-to pattern;Step-through pattern;Decreased step length - right;Decreased step length - left;Decreased stride length;Decreased trunk rotation;Narrow base of support;Poor foot clearance - left;Poor foot clearance - right Gait velocity: slightly decreased Stairs / Additional Locomotion Stairs: Yes Stairs Assistance: Contact Guard/Touching assist Stair Management Technique: Two rails Number of Stairs: 12 Height of Stairs: 6 Ramp: Supervision/Verbal cueing Curb: Supervision/Verbal cueing Wheelchair Mobility Wheelchair Mobility: No   Discharge Criteria: Patient will be discharged from PT if patient refuses treatment 3 consecutive times without medical reason, if treatment goals not met, if there is a change in medical status, if patient makes no progress towards goals or if patient is discharged from hospital.  The above assessment, treatment  plan, treatment alternatives and goals were discussed and mutually agreed upon: by patient  Huntley Dec PT, DPT 12/20/2022, 12:07 PM

## 2022-12-20 NOTE — Progress Notes (Signed)
PROGRESS NOTE   Subjective/Complaints:  Pt reports LBM overnight  Intermittent pain from rib fractures from CPR  Asking why fluid isn't All gone"- in legs- in spite of unna boots- explained will improve even more after CABG.   Asking for breakfast- hadn't received yet.   Asking ot speak to CTS about surgery and how it's done, etc.    ROS:  Pt denies SOB, abd pain, CP, N/V/C/D, and vision changes    Objective:   DG Chest 2 View  Result Date: 12/19/2022 CLINICAL DATA:  Cough. EXAM: CHEST - 2 VIEW COMPARISON:  Radiograph 12/17/2022, CT 11 11/11/2022 FINDINGS: Similar left pleural effusion and basilar opacity. The right lung is clear. Unchanged heart size and mediastinal contours. No pulmonary edema or pneumothorax. Stable osseous structures. IMPRESSION: Similar left pleural effusion and basilar opacity. Electronically Signed   By: Narda Rutherford M.D.   On: 12/19/2022 21:27   Recent Labs    12/20/22 0547  WBC 5.0  HGB 10.2*  HCT 30.3*  PLT 409*   Recent Labs    12/18/22 0229 12/20/22 0547  NA 130* 131*  K 4.3 3.7  CL 93* 95*  CO2 30 29  GLUCOSE 114* 95  BUN 17 14  CREATININE 0.98 0.77  CALCIUM 9.0 9.0    Intake/Output Summary (Last 24 hours) at 12/20/2022 0925 Last data filed at 12/20/2022 0506 Gross per 24 hour  Intake 480 ml  Output 1625 ml  Net -1145 ml        Physical Exam: Vital Signs Blood pressure 108/72, pulse 71, temperature 97.8 F (36.6 C), temperature source Oral, resp. rate 18, height 6\' 2"  (1.88 m), weight 126.3 kg, SpO2 100%.     General: awake, alert, appropriate, NAD HENT: conjugate gaze; oropharynx moist CV: regular rate- rate controlled; no JVD Pulmonary: CTA B/L; no W/R/R- decreased at bases- esp at LLQ GI: soft, NT, ND, (+)BS- normoactive Psychiatric: appropriate- asking for breakfast Neurological: Ox3 but fair to poor STM MSK- overall 5-/5 B/L    Assessment/Plan: 1. Functional deficits which require 3+ hours per day of interdisciplinary therapy in a comprehensive inpatient rehab setting. Physiatrist is providing close team supervision and 24 hour management of active medical problems listed below. Physiatrist and rehab team continue to assess barriers to discharge/monitor patient progress toward functional and medical goals  Care Tool:  Bathing              Bathing assist       Upper Body Dressing/Undressing Upper body dressing        Upper body assist      Lower Body Dressing/Undressing Lower body dressing            Lower body assist       Toileting Toileting    Toileting assist       Transfers Chair/bed transfer  Transfers assist           Locomotion Ambulation   Ambulation assist              Walk 10 feet activity   Assist           Walk 50 feet activity   Assist  Walk 150 feet activity   Assist           Walk 10 feet on uneven surface  activity   Assist           Wheelchair     Assist               Wheelchair 50 feet with 2 turns activity    Assist            Wheelchair 150 feet activity     Assist          Blood pressure 108/72, pulse 71, temperature 97.8 F (36.6 C), temperature source Oral, resp. rate 18, height 6\' 2"  (1.88 m), weight 126.3 kg, SpO2 100%.    Medical Problem List and Plan: 1. Debility 2/2 cardiac arrest             -patient may shower             -ELOS/Goals: modI 5-7 days             Admit to CIR   First day of evaluations- con't CIR PT and OT  -p[t asking to speak with CTS about surgery 2.  Antithrombotics: -DVT/anticoagulation:  Pharmaceutical: Eliquis             -antiplatelet therapy: aspirin 81 mg daily   3. Pain Management: Tylenol and Oxy as needed   11/21- pain controlled with oxycodone as needed 4. Mood/Behavior/Sleep: LCSW to evaluate and provide emotional  support             -continue trazodone 100 mg at bedtime prn             -antipsychotic agents: n/a   5. Neuropsych/cognition: This patient is capable of making decisions on his own behalf.   6. Skin/Wound Care: Routine skin care checks   7. Fluids/Electrolytes/Nutrition: Strict Is and Os and follow-up chemistries   8: Hypertension: monitor TID and prn   11/21- BP soft- but con't regimen- no dizziness 9: Hyperlipidemia: continue statin   10: Acute systolic CHF (cardiogenic shock>>resolved)             -daily weight             -continue digoxin 0.125 mg daily             -continue spironolactone 25 mg daily             -continue losartan 25 mg daily             -continue metoprolol succinate 50 mg daily             -continue torsemide 40 mg daily             --continue amiodarone 400 mg BID (decrease to 200 mg BID 11/21)   11/21- Will remove Unna boots and place TEDs since looking great  - Weight 126.3 kg on the bed scale-  11: CAD:             -plan CABG 12/02             -follow-up with Dr. Laneta Simmers             -continue asa 81 mg daily   12: Atrial fibrillation with RVR now in NSR:             -on Eliquis 5 mg BID             -continue digoxin 0.125 mg daily             -  continue amiodarone 400 mg BID   13: BLE edema: unna boot dressing placed on acute; denies ulceration             -consider TEDs and remove unna wraps in AM   11/21- will d/c Unna boots and start TEDs 14: Alcohol use: cessation counseling   15: Morbid Obesity: BMI = 35.68- due to CHF   11/21- BMI 35.76 16: Anemia: normal serum iron, elevated ferritin -follow-up CBC   17: Hyponatremia: trending up slowly -follow-up BMP   18: Intermittent productive cough             -continue Mucinex             -check PA/Lat chest x-ray             -continue IS   11/21- per pt, almost gone.    I spent a total of  44  minutes on total care today- >50% coordination of care- due to  D/w CTS about coming to see  pt- d/w nursing about UNNA boots as well as with PA about unna boots Also review of labs; vitals and weight.    LOS: 1 days A FACE TO FACE EVALUATION WAS PERFORMED  Marji Kuehnel 12/20/2022, 9:25 AM

## 2022-12-20 NOTE — Progress Notes (Signed)
Physical Therapy Session Note  Patient Details  Name: Jeremiah Dixon MRN: [id number] Date of Birth: 11/05/1960  Today's Date: 12/20/2022 PT Individual Time: 4098-1191 PT Individual Time Calculation (min): 57 min   Short Term Goals: Week 1:  PT Short Term Goal 1 (Week 1): STG=LTG due to LOS  Skilled Therapeutic Interventions/Progress Updates:   Received pt sitting in recliner, pt agreeable to PT treatment, and denied any pain during session. Session with emphasis on functional mobility/transfers, toileting, global strengthening and endurance, dynamic standing balance/coordination, NMR, and gait training.   Pt performed all transfers without AD and supervision throughout session with good adherence to sternal precautions. Pt ambulated in/out of bathroom without AD and supervision. Pt able to perform all toileting tasks and stood to [employer] with supervision. Pt ambulated 147ft without AD and supervision to main therapy gym. Pt participated in BERG balance test and demonstrates increased fall risk as noted by score of   49/56 on Berg Balance Scale.  (<36= high risk for falls, close to 100%; 37-45 significant >80%; 46-51 moderate >50%; 52-55 lower >25%)  In hallway, pt performed lateral side steps with grn TB around ankles x40ft with CGA - emphasis on hip abductor strengthening - limited by fatigue. Pt required rest breaks throughout session but HR remained 71-90bpm throughout. Continued to educate pt on energy conservation strategies and recognition of fatigue onset. Ambulated 176ft without AD and supervision to dayroom and performed forward high knee marching inside agility ladder x 4 laps with CGA for balance - pt limited by 9/10 fatigue. Ambulated 24ft withou AD and supervision back to room and pt took pictures of pulse oximeter and disuccsed general parameters for SPO2 and HR. Concluded session with pt sitting in recliner with all needs within reach.   Therapy  Documentation Precautions:  Restrictions Weight Bearing Restrictions: No RUE Weight Bearing: Non weight bearing  Therapy/Group: Individual Therapy Marlana Salvage Zaunegger Blima Rich PT, DPT 12/20/2022, 7:14 AM

## 2022-12-20 NOTE — Discharge Summary (Signed)
Physician Discharge Summary  Patient ID: Jeremiah Dixon MRN: [id number] DOB/AGE: 06/22/1960 62 y.o.  Admit date: 12/19/2022 Discharge date: 12/25/2022  Discharge Diagnoses:  Principal Problem:   Debility Active problems: [place] secondary to cardiac arrest Insomnia Hypertension Hyperlipidemia Acute systolic congestive heart failure Coronary artery disease Atrial fibrillation Bilateral lower extremity edema Alcohol use Obesity Anemia Hyponatremia Intermittent productive cough  Discharged Condition: stable  Significant Diagnostic Studies: DG Chest 2 View  Result Date: 12/19/2022 CLINICAL DATA:  Cough. EXAM: CHEST - 2 VIEW COMPARISON:  Radiograph 12/17/2022, CT 11 11/11/2022 FINDINGS: Similar left pleural effusion and basilar opacity. The right lung is clear. Unchanged heart size and mediastinal contours. No pulmonary edema or pneumothorax. Stable osseous structures. IMPRESSION: Similar left pleural effusion and basilar opacity. Electronically Signed   By: Narda Rutherford M.D.   On: 12/19/2022 21:27    Labs:  Basic Metabolic Panel: Recent Labs  Lab 12/20/22 0547 12/24/22 0538  NA 131* 133*  K 3.7 4.0  CL 95* 94*  CO2 29 28  GLUCOSE 95 93  BUN 14 13  CREATININE 0.77 1.08  CALCIUM 9.0 9.3    CBC: Recent Labs  Lab 12/20/22 0547 12/24/22 0538  WBC 5.0 4.7  NEUTROABS 2.8  --   HGB 10.2* 11.1*  HCT 30.3* 33.8*  MCV 91.8 94.4  PLT 409* 402*    CBG: Recent Labs  Lab 12/18/22 2013 12/19/22 0842  GLUCAP 100* 136*    Brief HPI:   Jeremiah Dixon is a 62 y.o. male brought in by [facility] from home to the ED on 12/06/2022 with initial reports of callout for chest pain and shortness of breath. And route he increasingly became hypoxic, pale and cyanotic with subsequent respiratory arrest. On arrival CPR was started and ROSC was obtained after 22 minutes. PEA throughout ongoing CPR efforts. No STEMI on ECG. Intubated and admitted to critical care  medicine. Onset of atrial fibrillation with RVR. Cardiology consultation and echocardiogram obtained. Administered tenecteplase for presumed pulmonary embolism and subsequently diagnosed with cardiogenic shock and acute systolic congestive heart failure. Advanced heart failure team consulted. Bilateral lower extremity venous duplex exams with no DVT. Extubated on 11/08 and weaned off all vasopressors. Had DC/CV 11/15. He remains in normal sinus rhythm. On Eliquis 5 mg twice daily. He was extubated on 11/08 and diuresed. Underwent cardiac catheterization with severe three-vessel coronary artery disease noted. Cardiothoracic surgery consulted and plan for CABG early in December. Repeat echocardiogram with EF 40 to 45%, right ventricle normal. Did definitively difficult to confirm pulmonary embolism diagnosis but will continue on long-term anticoagulation due to atrial fibrillation. Suspect initial presentation was due to HF/AF. Underwent left thoracentesis for left pleural effusion with no residual effusion noted on follow-up chest x-ray. Tolerating diet. Gait distance of 300 feet with RW.    Hospital Course: Jeremiah Dixon was admitted to rehab 12/19/2022 for inpatient therapies to consist of PT, ST and OT at least three hours five days a week. Past admission physiatrist, therapy team and rehab RN have worked together to provide customized collaborative inpatient rehab.  He reported persistent, intermittent productive cough on admission and chest x-ray was updated.  No active disease.  Unna boot dressings removed from both lower legs and TED hose placed.  No signs or symptoms of volume overload.  Improved hemoglobin on follow-up labs.  Central chest pain from prior chest compressions and ongoing intermittent cough without fever or worsening phlegm production.  Weight remained stable.  Serum sodium continued to  trend upward and was 133 on 11/25.  Hemoglobin increased to 11.1 on 11/25.  Blood pressures were  monitored on TID basis and remained stable on GDMT for CHF. Losartan discontinued at discharge due to upcoming surgery.  Rehab course: During patient's stay in rehab weekly team conferences were held to monitor patient's progress, set goals and discuss barriers to discharge. At admission, patient required supervision with mobility and with basic self-help skills.  He has had improvement in activity tolerance, balance, postural control as well as ability to compensate for deficits. He has had improvement in functional use RUE/LUE  and RLE/LLE as well as improvement in awareness Patient has met 8 of 8 long term goals due to improved activity tolerance, improved balance, and ability to compensate for deficits.  Patient to discharge at overall Independent level.  Patient is able to complete all ADLs and IADLs as necessary.   Discharge disposition: 01-Home or Self Care     Diet: heart healthy  Special Instructions: No driving, alcohol consumption or tobacco use.  Eliquis discontinued at discharge and Lovenox bridge initiated with last dose of Lovenox evening of 11/30.  Discharge Instructions     Discharge patient   Complete by: As directed    Discharge disposition: 01-Home or Self Care   Discharge patient date: 12/25/2022      Allergies as of 12/25/2022       Reactions   Bee Venom Palpitations   Diclofenac Diarrhea   Amoxicillin Diarrhea, Nausea Only   Lisinopril Cough   Pantoprazole Nausea Only   Other Reaction(s): chest pain        Medication List     STOP taking these medications    apixaban 5 MG Tabs tablet Commonly known as: ELIQUIS   chlorpheniramine-HYDROcodone 10-8 MG/5ML Commonly known as: TUSSIONEX   lidocaine 5 % Commonly known as: LIDODERM   losartan 25 MG tablet Commonly known as: COZAAR   senna-docusate 8.6-50 MG tablet Commonly known as: Senokot-S       TAKE these medications    acetaminophen 325 MG tablet Commonly known as: TYLENOL Take  1-2 tablets (325-650 mg total) by mouth every 4 (four) hours as needed for mild pain (pain score 1-3).   amiodarone 200 MG tablet Commonly known as: PACERONE Take 1 tablet (200 mg total) by mouth 2 (two) times daily. What changed:  medication strength how much to take   aspirin 81 MG chewable tablet Chew 1 tablet (81 mg total) by mouth daily.   digoxin 0.125 MG tablet Commonly known as: LANOXIN Take 1 tablet (0.125 mg total) by mouth daily.   enoxaparin 120 MG/0.8ML injection Commonly known as: LOVENOX Inject 0.8 mLs (120 mg total) into the skin every 12 (twelve) hours for 10 doses.   esomeprazole 40 MG capsule Commonly known as: NEXIUM Take 1 capsule (40 mg total) by mouth 2 (two) times daily before a meal.   guaiFENesin 600 MG 12 hr tablet Commonly known as: MUCINEX Take 1 tablet (600 mg total) by mouth 2 (two) times daily. What changed:  when to take this reasons to take this   metoprolol succinate 50 MG 24 hr tablet Commonly known as: TOPROL-XL Take 1 tablet (50 mg total) by mouth daily. Take with or immediately following a meal.   nitroGLYCERIN 0.4 MG SL tablet Commonly known as: NITROSTAT Place 0.4 mg under the tongue every 5 (five) minutes as needed.   psyllium 95 % Pack Commonly known as: HYDROCIL/METAMUCIL Take 1 packet by mouth daily.  rosuvastatin 20 MG tablet Commonly known as: CRESTOR Take 1 tablet (20 mg total) by mouth daily.   spironolactone 25 MG tablet Commonly known as: ALDACTONE Take 1 tablet (25 mg total) by mouth daily.   torsemide 20 MG tablet Commonly known as: DEMADEX Take 2 tablets (40 mg total) by mouth daily. What changed: medication strength   traZODone 100 MG tablet Commonly known as: DESYREL Take 1 tablet (100 mg total) by mouth at bedtime as needed for sleep.        Follow-up Information     Lovorn, Aundra Millet, MD Follow up.   Specialty: Physical Medicine and Rehabilitation Why: As needed Contact information: [address] [place] [address] [phone]         Barry Brunner A, PA-C Follow up.   Specialty: Physician Assistant Why: Call the office in 1 to 2 days to make arrangements for hospital follow-up appointment. Contact information: [address] [place] [address] [phone]         [name], MD. Go to.   Specialty: Cardiothoracic Surgery Why: Pre-admission testing appointment set. Contact information: [address] [place] [address] [phone]         Bensimhon, Bevelyn Buckles, MD. Schedule an appointment as soon as possible for a visit.   Specialty: Cardiology Why: As instructed. Contact information: [address] [place] [address] [phone]                 Signed: Milinda Antis 12/25/2022, 8:37 AM

## 2022-12-20 NOTE — Progress Notes (Signed)
Inpatient Rehabilitation  Patient information reviewed and entered into eRehab system by Oyuki Hogan M. Eri Mcevers, M.A., CCC/SLP, PPS Coordinator.  Information including medical coding, functional ability and quality indicators will be reviewed and updated through discharge.    

## 2022-12-21 ENCOUNTER — Telehealth (HOSPITAL_COMMUNITY): Payer: Self-pay | Admitting: Pharmacy Technician

## 2022-12-21 ENCOUNTER — Other Ambulatory Visit (HOSPITAL_COMMUNITY): Payer: Self-pay

## 2022-12-21 DIAGNOSIS — R5381 Other malaise: Secondary | ICD-10-CM | POA: Diagnosis not present

## 2022-12-21 NOTE — Plan of Care (Signed)
Patient had a quite night. Remains alert and oriented. Continent X2. Safety. maintained

## 2022-12-21 NOTE — Progress Notes (Signed)
PROGRESS NOTE   Subjective/Complaints:  Pt reports cough virtually gone and the pain from rib fx when coughs as well.  LBM 2-3x yesterday- going well.  Walked 324ft with RW with therapy. However that's not what's documented- did 150 ft without AD Did shower/hygiene-  Wants to leave before thanksgiving.    ROS:   Pt denies SOB, abd pain, CP, N/V/C/D, and vision changes   Objective:   DG Chest 2 View  Result Date: 12/19/2022 CLINICAL DATA:  Cough. EXAM: CHEST - 2 VIEW COMPARISON:  Radiograph 12/17/2022, CT 11 11/11/2022 FINDINGS: Similar left pleural effusion and basilar opacity. The right lung is clear. Unchanged heart size and mediastinal contours. No pulmonary edema or pneumothorax. Stable osseous structures. IMPRESSION: Similar left pleural effusion and basilar opacity. Electronically Signed   By: Narda Rutherford M.D.   On: 12/19/2022 21:27   Recent Labs    12/20/22 0547  WBC 5.0  HGB 10.2*  HCT 30.3*  PLT 409*   Recent Labs    12/20/22 0547  NA 131*  K 3.7  CL 95*  CO2 29  GLUCOSE 95  BUN 14  CREATININE 0.77  CALCIUM 9.0    Intake/Output Summary (Last 24 hours) at 12/21/2022 0858 Last data filed at 12/21/2022 0317 Gross per 24 hour  Intake 480 ml  Output 2300 ml  Net -1820 ml        Physical Exam: Vital Signs Blood pressure 119/74, pulse 77, temperature 98.3 F (36.8 C), resp. rate 18, height 6\' 2"  (1.88 m), weight 119.5 kg, SpO2 96%.     General: awake, alert, appropriate, sitting up in bed- asking about breakfast; NAD HENT: conjugate gaze; oropharynx moist CV: regular rate- irregular rhythm; no JVD Pulmonary: CTA B/L; no W/R/R- slightly decreased at bases, but getting better GI: soft, NT, ND, (+)BS Psychiatric: appropriate- asking about breakfast Neurological: Ox3- fair STM- but cannot remember names  MSK- overall 5-/5 B/L   Assessment/Plan: 1. Functional deficits which require 3+  hours per day of interdisciplinary therapy in a comprehensive inpatient rehab setting. Physiatrist is providing close team supervision and 24 hour management of active medical problems listed below. Physiatrist and rehab team continue to assess barriers to discharge/monitor patient progress toward functional and medical goals  Care Tool:  Bathing    Body parts bathed by patient: Right arm, Face, Left arm, Left lower leg, Chest, Abdomen, Front perineal area, Right upper leg, Buttocks, Left upper leg, Right lower leg         Bathing assist Assist Level: Supervision/Verbal cueing     Upper Body Dressing/Undressing Upper body dressing   What is the patient wearing?: Pull over shirt    Upper body assist Assist Level: Supervision/Verbal cueing    Lower Body Dressing/Undressing Lower body dressing            Lower body assist Assist for lower body dressing: Supervision/Verbal cueing     Toileting Toileting    Toileting assist Assist for toileting: Supervision/Verbal cueing     Transfers Chair/bed transfer  Transfers assist     Chair/bed transfer assist level: Supervision/Verbal cueing     Locomotion Ambulation   Ambulation assist  Assist level: Supervision/Verbal cueing Assistive device: No Device Max distance: >144ft   Walk 10 feet activity   Assist     Assist level: Supervision/Verbal cueing Assistive device: No Device   Walk 50 feet activity   Assist    Assist level: Supervision/Verbal cueing Assistive device: No Device    Walk 150 feet activity   Assist    Assist level: Supervision/Verbal cueing Assistive device: No Device    Walk 10 feet on uneven surface  activity   Assist     Assist level: Supervision/Verbal cueing     Wheelchair     Assist Is the patient using a wheelchair?: No   Wheelchair activity did not occur: N/A         Wheelchair 50 feet with 2 turns activity    Assist    Wheelchair 50 feet  with 2 turns activity did not occur: N/A       Wheelchair 150 feet activity     Assist  Wheelchair 150 feet activity did not occur: N/A       Blood pressure 119/74, pulse 77, temperature 98.3 F (36.8 C), resp. rate 18, height 6\' 2"  (1.88 m), weight 119.5 kg, SpO2 96%.    Medical Problem List and Plan: 1. Debility 2/2 cardiac arrest             -patient may shower             -ELOS/Goals: modI 5-7 days             Con't CIR PT and OT  IPOC today  Pt spoke with someone from CT surgery, per pt- don't see note 2.  Antithrombotics: -DVT/anticoagulation:  Pharmaceutical: Eliquis             -antiplatelet therapy: aspirin 81 mg daily   3. Pain Management: Tylenol and Oxy as needed   11/21- pain controlled with oxycodone as needed 4. Mood/Behavior/Sleep: LCSW to evaluate and provide emotional support             -continue trazodone 100 mg at bedtime prn             -antipsychotic agents: n/a   5. Neuropsych/cognition: This patient is capable of making decisions on his own behalf.   6. Skin/Wound Care: Routine skin care checks   7. Fluids/Electrolytes/Nutrition: Strict Is and Os and follow-up chemistries   8: Hypertension: monitor TID and prn   11/21- BP soft- but con't regimen- no dizziness  11/22- BP doing better- in 110s systolic- con't regimen 9: Hyperlipidemia: continue statin   10: Acute systolic CHF (cardiogenic shock>>resolved)             -daily weight             -continue digoxin 0.125 mg daily             -continue spironolactone 25 mg daily             -continue losartan 25 mg daily             -continue metoprolol succinate 50 mg daily             -continue torsemide 40 mg daily             --continue amiodarone 400 mg BID (decrease to 200 mg BID 11/21)   11/21- Will remove Unna boots and place TEDs since looking great  - Weight 126.3 kg on the bed scale-   11/22- Weight 119.5 kg today! Not sure  if they switched scales?- will f/u on this 11: CAD:              -plan CABG 12/02             -follow-up with Dr. Laneta Simmers             -continue asa 81 mg daily   12: Atrial fibrillation with RVR now in NSR:             -on Eliquis 5 mg BID             -continue digoxin 0.125 mg daily             -continue amiodarone 400 mg BID   11/22- continues in Afib 13: BLE edema: unna boot dressing placed on acute; denies ulceration             -consider TEDs and remove unna wraps in AM   11/21- will d/c Unna boots and start TEDs  11/22- going OK per pt 14: Alcohol use: cessation counseling   15: Morbid Obesity: BMI = 35.68- due to CHF   11/21- BMI 35.76 16: Anemia: normal serum iron, elevated ferritin -follow-up CBC   11/22- Hb 10.2- up from low 9's 17: Hyponatremia: trending up slowly -follow-up BMP 11/22- Na 131    18: Intermittent productive cough             -continue Mucinex             -check PA/Lat chest x-ray             -continue IS   11/21- per pt, almost gone.   11/22- virtually gone per pt- and the pain that went with it    I spent a total of  35  minutes on total care today- >50% coordination of care- due to  D/w nursing about breakfast- took some time to get- as well as IPOC and review of weight, vitals; and labs.    LOS: 2 days A FACE TO FACE EVALUATION WAS PERFORMED  Jeremiah Dixon 12/21/2022, 8:58 AM

## 2022-12-21 NOTE — Progress Notes (Signed)
Occupational Therapy Session Note  Patient Details  Name: Jeremiah Dixon MRN: [id number] Date of Birth: 10-23-1960  Today's Date: 12/21/2022 OT Individual Time: 4098-1191 OT Individual Time Calculation (min): 70 min    Short Term Goals: Week 1:  OT Short Term Goal 1 (Week 1): STG=LTG d/t ELOS  Skilled Therapeutic Interventions/Progress Updates:    Pt resting in recliner upon arrival. All BADLs during session with supervision. Pt stood in shower with no LOB. Pt able to don Ted hose wihtout assistance. Amb throughout unit wihtout AD at supervision. Focus on endurance, balance, and safety awareness. Pt doffed and donned pants while standing and using UE for support. Recommended to doff/don pants while seated when at home. Pt amb from room to ortho gym and engaged in 10 mins NuStep level 4 BLE only. Pt returned to main gym for standing balance tasks on Airex. Pt tossed horseshoes with no LOB at supervision level. Pt able to retrieve items from floor wihtout LOB. Pt returned to room and completed BADLs as described above. Pt stood at sink for grooming. Pt returned to recliner. Seat alarm activated. All needs within reach.   Therapy Documentation Precautions:  Precautions Precautions: Fall, Sternal Precaution Comments: has sternal pillow Restrictions Weight Bearing Restrictions: No RUE Weight Bearing: Non weight bearing   Pain: Pt denies pain   Therapy/Group: Individual Therapy  Rich Brave 12/21/2022, 11:57 AM

## 2022-12-21 NOTE — IPOC Note (Signed)
Overall Plan of Care [facility]) Patient Details Name: Jeremiah Dixon MRN: [id number] DOB: 04-08-1960  Admitting Diagnosis: Debility  Hospital Problems: Principal Problem:   Debility     Functional Problem List: Nursing Bowel, Endurance, Medication Management, Pain, Safety, Skin Integrity  PT Balance, Endurance, Pain  OT Balance, Edema, Endurance, Pain, Safety  SLP    TR         Basic ADL's: OT Grooming, Bathing, Dressing, Toileting     Advanced  ADL's: OT Light Housekeeping     Transfers: PT Bed Mobility, Bed to Chair, Car, Occupational psychologist, Research scientist (life sciences): PT Ambulation, Educational psychologist, Psychologist, prison and probation services     Additional Impairments: OT None  SLP        TR      Anticipated Outcomes Item Anticipated Outcome  Self Feeding mod I  Swallowing      Basic self-care  mod I  Toileting  mod I   Bathroom Transfers mod I  Bowel/Bladder  continent B/B  Transfers  Independent  Locomotion  Independent  Communication     Cognition     Pain  less than 4  Safety/Judgment  with cues   Therapy Plan: PT Intensity: Minimum of 1-2 x/day ,45 to 90 minutes PT Frequency: 5 out of 7 days PT Duration Estimated Length of Stay: 3-5 days OT Intensity: Minimum of 1-2 x/day, 45 to 90 minutes OT Frequency: 5 out of 7 days OT Duration/Estimated Length of Stay: 3-5 days     Team Interventions: Nursing Interventions Patient/Family Education, Bowel Management, Disease Management/Prevention, Pain Management, Medication Management, Skin Care/Wound Management, Discharge Planning, Psychosocial Support  PT interventions Ambulation/gait training, Discharge planning, Functional mobility training, Psychosocial support, Therapeutic Activities, Balance/vestibular training, Disease management/prevention, Neuromuscular re-education, Skin care/wound management, Therapeutic Exercise, DME/adaptive equipment instruction, Pain management, UE/LE Strength taining/ROM, Community  reintegration, Equities trader education, Museum/gallery curator, UE/LE Coordination activities  OT Interventions Warden/ranger, Discharge planning, Pain management, Self Care/advanced ADL retraining, Therapeutic Activities, Disease mangement/prevention, Functional mobility training, Patient/family education, Skin care/wound managment, Therapeutic Exercise, Community reintegration, Fish farm manager, Neuromuscular re-education, Psychosocial support, UE/LE Strength taining/ROM, Splinting/orthotics  SLP Interventions    TR Interventions    SW/CM Interventions Discharge Planning, Psychosocial Support, Patient/Family Education   Barriers to Discharge MD  Medical stability, Home enviroment access/loayout, Lack of/limited family support, and Weight  Nursing Decreased caregiver support, Home environment access/layout, Wound Care, Lack of/limited family support, Insurance for SNF coverage, Weight, Weight bearing restrictions, Medication compliance home alone with 7 + 7 ste with BHR  PT Inaccessible home environment, Decreased caregiver support, Home environment access/layout 2 flights of steps to enter apartment and lives alone  OT      SLP      SW Decreased caregiver support, Lack of/limited family support     Team Discharge Planning: Destination: PT-Home ,OT- Home , SLP-  Projected Follow-up: PT-Outpatient PT (/cardiac rehab), OT-  Outpatient OT, SLP-  Projected Equipment Needs: PT-To be determined, OT- To be determined, SLP-  Equipment Details: PT-has none, OT-  Patient/family involved in discharge planning: PT- Patient,  OT-Patient, SLP-   MD ELOS: 4-6 days Medical Rehab Prognosis:  Excellent Assessment: The patient has been admitted for CIR therapies with the diagnosis of Cardiac arrest with mild memory issues- debility as result. The team will be addressing functional mobility, strength, stamina, balance, safety, adaptive techniques and equipment, self-care, bowel and  bladder mgt, patient and caregiver education, . Goals have been set at mod I. Anticipated discharge destination is  home with family- .        See Team Conference Notes for weekly updates to the plan of care

## 2022-12-21 NOTE — Progress Notes (Signed)
Physical Therapy Session Note  Patient Details  Name: Jeremiah Dixon MRN: [id number] Date of Birth: Dec 04, 1960  Today's Date: 12/21/2022 PT Individual Time: 4098-1191, 4782-9562  PT Individual Time Calculation (min): 45 min, 75 min   Short Term Goals: Week 1:  PT Short Term Goal 1 (Week 1): STG=LTG due to LOS  Skilled Therapeutic Interventions/Progress Updates:    Session 1: pt received in bed and agreeable to therapy. Pt with intermittent unrated rib pain with some mobility. Discussed strategies for maintaining sternal precautions to minimize pain.   Bed mobility with supervision. Pt dressed set up a, except ted hose which he required min a to adjust and fix wrinkles after donning. Donned hoodie in standing with close supervision.   ambulatory transfer to bathroom for continent standing void with distant supervision for privacy at pt request.   Pt then ambulated throughout session with CGA- supervision. Session focused on step ups with CL LE to 2nd step, 2 x 15 BIL. Monitored HR/O2 and noted appropriate rise in HR with activity. Provided education as appropriate. Discussed pacing and energy conservation, but pt will benefit from further discussion on this topic. Pt returned to room and sat in recliner, was left with all needs in reach and alarm active.   Session 2: Pt received in recliner and agreeable to therapy.  No complaint of pain at rest.   Pt participated in gait training to [facility] tower and back with 3 seated rest breaks, distances >300 ft. Pt aware of mild gait deviations, including wide BOS at times. Pt reports improved balance vs previous day.   During rest breaks, provided education and hand out for RPE scale. Recommended pt exercise in 13-14 range and take rest breaks for energy conservation. Extensive education on listening to his body and use of pulse oximeter as needed. Also discussed pt goals, home exercise safety, and d/c plan.   3 x 10 Sit to stand with cues for  smaller BOS, discussed pt had near slip from wide stance, greatly improved stability while standing with smaller BOS.    Pt returned to room and to recliner, was left with all needs in reach and alarm active.   Therapy Documentation Precautions:  Precautions Precautions: Fall, Sternal Precaution Comments: has sternal pillow Restrictions Weight Bearing Restrictions: No RUE Weight Bearing: Non weight bearing General:       Therapy/Group: Individual Therapy  Juluis Rainier 12/21/2022, 12:42 PM

## 2022-12-21 NOTE — Telephone Encounter (Signed)
Patient Product/process development scientist completed.    The patient is insured through [facility]. Patient has [employer], may use a copay card, and/or apply for patient assistance if available.    Ran test claim for Entresto 24-26 mg and the current 30 day co-pay is $0.00.  Ran test claim for Farixga 10 mg and the current 30 day co-pay is $0.00.  Ran test claim for Jardiance 10 mg and the current 30 day co-pay is $0.00.  This test claim was processed through [facility]- copay amounts may vary at other pharmacies due to pharmacy/plan contracts, or as the patient moves through the different stages of their insurance plan.     Roland Earl, CPHT Pharmacy Technician III Certified Patient Advocate [facility] Patient Advocate Team Direct Number: [phone]  Fax: [phone]

## 2022-12-22 DIAGNOSIS — R5381 Other malaise: Secondary | ICD-10-CM | POA: Diagnosis not present

## 2022-12-22 MED ORDER — MENTHOL 3 MG MT LOZG
1.0000 | LOZENGE | OROMUCOSAL | Status: DC | PRN
  Administered 2022-12-22 – 2022-12-25 (×6): 3 mg via ORAL
  Filled 2022-12-22 (×2): qty 9

## 2022-12-22 NOTE — Progress Notes (Signed)
PROGRESS NOTE   Subjective/Complaints: Patient seen in room in bedside chair, with brother at bedside.  Complaining that he is not allowed to be independent in the room, informed by PT that he could have alarm bells turned off and was safe for mobility.  Confirmed this with charge nurse.    Otherwise, ongoing chest pain, especially with productive coughing.  This is improved with use of heart pillow, and tolerable with current pain control measures.  He does not wish for any cough suppressants at this time, has been good with use of incentive spirometry.  Vital stable, no acute concerns. ROS:  Positive for chest pain, cough-ongoing Pt denies SOB, abd pain, CP, N/V/C/D, and vision changes   Objective:   No results found. Recent Labs    12/20/22 0547  WBC 5.0  HGB 10.2*  HCT 30.3*  PLT 409*   Recent Labs    12/20/22 0547  NA 131*  K 3.7  CL 95*  CO2 29  GLUCOSE 95  BUN 14  CREATININE 0.77  CALCIUM 9.0    Intake/Output Summary (Last 24 hours) at 12/22/2022 2131 Last data filed at 12/22/2022 1753 Gross per 24 hour  Intake 1320 ml  Output 1025 ml  Net 295 ml        Physical Exam: Vital Signs Blood pressure 111/69, pulse 70, temperature 98.3 F (36.8 C), temperature source Oral, resp. rate 18, height 6\' 2"  (1.88 m), weight 123.3 kg, SpO2 98%.    PE: Constitution: Appropriate appearance for age. No apparent distress   Resp: No respiratory distress. No accessory muscle usage. on RA and CTAB Cardio: Regular rate and rhythm.  Systolic murmur.  Well perfused appearance.  Trace bilateral lower extremity peripheral edema. Abdomen: Nondistended. Nontender.  Positive bowel sounds, normoactive. Psych: Appropriate mood and affect. Neuro: AAOx4. No apparent cognitive deficits   Neurologic Exam:   Motor exam: strength 5/5 throughout bilateral upper extremities and bilateral lower extremities Coordination: Fine  motor coordination was normal.   Gait: normal     Assessment/Plan: 1. Functional deficits which require 3+ hours per day of interdisciplinary therapy in a comprehensive inpatient rehab setting. Physiatrist is providing close team supervision and 24 hour management of active medical problems listed below. Physiatrist and rehab team continue to assess barriers to discharge/monitor patient progress toward functional and medical goals  Care Tool:  Bathing    Body parts bathed by patient: Right arm, Face, Left arm, Left lower leg, Chest, Abdomen, Front perineal area, Right upper leg, Buttocks, Left upper leg, Right lower leg         Bathing assist Assist Level: Supervision/Verbal cueing     Upper Body Dressing/Undressing Upper body dressing   What is the patient wearing?: Pull over shirt    Upper body assist Assist Level: Independent    Lower Body Dressing/Undressing Lower body dressing      What is the patient wearing?: Pants     Lower body assist Assist for lower body dressing: Supervision/Verbal cueing     Toileting Toileting    Toileting assist Assist for toileting: Supervision/Verbal cueing     Transfers Chair/bed transfer  Transfers assist     Chair/bed transfer  assist level: Supervision/Verbal cueing     Locomotion Ambulation   Ambulation assist      Assist level: Supervision/Verbal cueing Assistive device: No Device Max distance: >163ft   Walk 10 feet activity   Assist     Assist level: Supervision/Verbal cueing Assistive device: No Device   Walk 50 feet activity   Assist    Assist level: Supervision/Verbal cueing Assistive device: No Device    Walk 150 feet activity   Assist    Assist level: Supervision/Verbal cueing Assistive device: No Device    Walk 10 feet on uneven surface  activity   Assist     Assist level: Supervision/Verbal cueing     Wheelchair     Assist Is the patient using a wheelchair?: No              Wheelchair 50 feet with 2 turns activity    Assist            Wheelchair 150 feet activity     Assist          Blood pressure 111/69, pulse 70, temperature 98.3 F (36.8 C), temperature source Oral, resp. rate 18, height 6\' 2"  (1.88 m), weight 123.3 kg, SpO2 98%.    Medical Problem List and Plan: 1. Debility 2/2 cardiac arrest             -patient may shower             -ELOS/Goals: modI 5-7 days -is already functionally independent 11-23, can likely discharge Monday or Tuesday             Con't CIR PT and OT  IPOC today  Pt spoke with someone from CT surgery, per pt- don't see note  2.  Antithrombotics: -DVT/anticoagulation:  Pharmaceutical: Eliquis             -antiplatelet therapy: aspirin 81 mg daily   3. Pain Management: Tylenol and Oxy as needed   11/21- pain controlled with oxycodone as needed 4. Mood/Behavior/Sleep: LCSW to evaluate and provide emotional support             -continue trazodone 100 mg at bedtime prn             -antipsychotic agents: n/a   5. Neuropsych/cognition: This patient is capable of making decisions on his own behalf.   6. Skin/Wound Care: Routine skin care checks   7. Fluids/Electrolytes/Nutrition: Strict Is and Os and follow-up chemistries   8: Hypertension: monitor TID and prn   11/21- BP soft- but con't regimen- no dizziness  11/22- BP doing better- in 110s systolic- con't regimen  11-23: Normotensive, no changes    12/22/2022    7:36 PM 12/22/2022   12:59 PM 12/22/2022    6:06 AM  Vitals with BMI  Systolic 111 111 161  Diastolic 69 77 66  Pulse 70 69 71     9: Hyperlipidemia: continue statin   10: Acute systolic CHF (cardiogenic shock>>resolved)             -daily weight             -continue digoxin 0.125 mg daily             -continue spironolactone 25 mg daily             -continue losartan 25 mg daily             -continue metoprolol succinate 50 mg daily             -  continue torsemide  40 mg daily             --continue amiodarone 400 mg BID (decrease to 200 mg BID 11/21)   11/21- Will remove Unna boots and place TEDs since looking great  - Weight 126.3 kg on the bed scale-   11/22- Weight 119.5 kg today! Not sure if they switched scales?- will f/u on this  11-23: Weights increased from yesterday, but decreased overall.  No external signs of fluid overload.  No changes today, monitor Filed Weights   12/20/22 0811 12/21/22 0505 12/22/22 0500  Weight: 126.3 kg 119.5 kg 123.3 kg     11: CAD:             -plan CABG 12/02             -follow-up with Dr. Laneta Simmers             -continue asa 81 mg daily   12: Atrial fibrillation with RVR now in NSR:             -on Eliquis 5 mg BID             -continue digoxin 0.125 mg daily             -continue amiodarone 400 mg BID   11/22- continues in Afib 13: BLE edema: unna boot dressing placed on acute; denies ulceration             -consider TEDs and remove unna wraps in AM   11/21- will d/c Unna boots and start TEDs  11/22- going OK per pt  11-23: Trace, baseline per patient. 14: Alcohol use: cessation counseling   15: Morbid Obesity: BMI = 35.68- due to CHF   11/21- BMI 35.76 16: Anemia: normal serum iron, elevated ferritin -follow-up CBC   11/22- Hb 10.2- up from low 9's 17: Hyponatremia: trending up slowly -follow-up BMP 11/22- Na 131    18: Intermittent productive cough             -continue Mucinex             -check PA/Lat chest x-ray             -continue IS   11/21- per pt, almost gone.   11/22- virtually gone per pt- and the pain that went with it  11-23: Reviewed importance of pulmonary hygiene with incentive spirometer.  Discussed risks and benefits associated with cough suppressant; patient agrees with decision to forego these  LOS: 3 days A FACE TO FACE EVALUATION WAS PERFORMED  Angelina Sheriff 12/22/2022, 9:31 PM

## 2022-12-22 NOTE — Progress Notes (Signed)
Patient wants to be independent in room. Explained rationale for supervision, for safety. No BM. Daily weight off this AM, offered to stand up scale, patient declined-"I'm too cold."Jeremiah Dixon A

## 2022-12-22 NOTE — Progress Notes (Signed)
+/-   sleep. At 1959 PRN dulcolax tab given. Patient reports he hasn't had a BM in 24 hours, usually has 3/day. PRN MOM given at 0024. No BM, thus far. PRN oxy ir 5mg 's given with trazodone at 2241. At 0022, PRN tylenol given. BLE edema.NP cough, splinting with heart pillow. Jeremiah Dixon A

## 2022-12-23 DIAGNOSIS — R5381 Other malaise: Secondary | ICD-10-CM | POA: Diagnosis not present

## 2022-12-23 NOTE — Progress Notes (Signed)
Physical Therapy Session Note  Patient Details  Name: Jeremiah Dixon MRN: [id number] Date of Birth: 1960/05/25  Today's Date: 12/23/2022 PT Individual Time: 0800-0900 PT Individual Time Calculation (min): 60 min   Short Term Goals: Week 1:  PT Short Term Goal 1 (Week 1): STG=LTG due to LOS  Skilled Therapeutic Interventions/Progress Updates:      Therapy Documentation Precautions:  Precautions Precautions: Fall, Sternal Precaution Comments: has sternal pillow Restrictions Weight Bearing Restrictions: No RUE Weight Bearing: Non weight bearing  PT session with emphasis on pt education and global strength/activity tolerance training. Pt with unrated rib pain, provided rest breaks and educated to utilize heart pillow with coughing.   PT provided education on energy conservation and BORG rate of perceived exertion scale. Pt encouraged to monitor vitals and PT informed pt how to read personal pulse oximeter and encouraged pt to track BP daily. Pt also educated on pursed lip breathing strategies and use of incentive spirometer.  PT discussed post surgical rehab process and potential need for adaptive and assistive devices following procedure along with strategies on how to adhere to sternal precuations with functional activities.   Pt supervision with transfers and gait >400 ft without AD with adequate technique with min cues for energy conservation with long distance ambulation.   Pt requested to practice squats and presents with decreased weight shift to right and trendelenburg in single limb stance. Pt performed ~10 squats and plan to introduce right gluteus medius strengthening in follow up sessions.   Pt left seated at bedside with all needs in reach.   Therapy/Group: Individual Therapy  [name] [name] PT, DPT  12/23/2022, 9:21 AM

## 2022-12-23 NOTE — Progress Notes (Signed)
Occupational Therapy Session Note  Patient Details  Name: Jeremiah Dixon MRN: [id number] Date of Birth: 08-Jan-1961   Today's Date: 12/23/2022 OT Individual Time: 4098-1191 OT Individual Time Calculation (min): 40 min   Short Term Goals: Week 1:  OT Short Term Goal 1 (Week 1): STG=LTG d/t ELOS  Skilled Therapeutic Interventions/Progress Updates:    Patient seated in recliner at the time of arrival. Patient indicated that he rest well with occasional pain assicaiated with his ribs of  3 on a 0-10 scale. Patient indicated that nursing provides tylenol to address his pain response. Patient was able to ambulate free of device > than 270ft with CGA  to the gym to complete UB exercise using a 3lb dumb bell for bicep curl 2 sets of 10 with rest breaks as needed. The pt required 1 rest break. The pt went on to complete sit to stand 4x followed by simulated task in LB dressing using theraband following demonstration and vc's.  The pt was able to ambulate back to his room with close S >248ft and was returned to his recliner with his call light in reach and all additional needs addressed.   Therapy Documentation Precautions:  Precautions Precautions: Fall, Sternal Precaution Comments: has sternal pillow Restrictions Weight Bearing Restrictions: No RUE Weight Bearing: Non weight bearing  Therapy/Group: Individual Therapy  Lavona Mound 12/23/2022, 2:44 PM

## 2022-12-23 NOTE — Progress Notes (Signed)
Occupational Therapy Session Note  Patient Details  Name: Jeremiah Dixon MRN: [id number] Date of Birth: 08/02/1960  Today's Date: 12/23/2022 OT Individual Time: 1015-1100 & 1445-1530 OT Individual Time Calculation (min): 45 min & 45 min   Short Term Goals: Week 1:  OT Short Term Goal 1 (Week 1): STG=LTG d/t ELOS  Skilled Therapeutic Interventions/Progress Updates:      Therapy Documentation Precautions:  Precautions Precautions: Fall, Sternal Precaution Comments: has sternal pillow Restrictions Weight Bearing Restrictions: No RUE Weight Bearing: Non weight bearing  Session 1 General: "I got this monitor fro when I go home" Pt supine in bed upon OT arrival, agreeable to OT session.  Vital Signs: Pt monitored vitals with pulse ox throughout session, O2 95< and HR 90> throughout session  Pain: no pain reported  ADL: Toilet transfer: distant supervision ambulating with no AD ~25 ft Toileting: distant supervision, able to manage pants and hygiene after urinating Grooming: distant supervision standing for hand hygiene after toileting Community mobility: Pt demonstrated community mobility while ambulating in order to prepare for community integration at D/C. Pt ambulating requiring occasional rest breaks d/t cardiovascular recovery. Pt ambulated to [place] tower from room and back distant supervision with no AD  Other Treatments: Pt shown toileting aide options available for private purchase on [employer]. Pt and OT dicussed "move in the tube" for sternal precautions in order to prepare for D/C.    Pt seated in recliner at end of session with W/C alarm donned, call light within reach and 4Ps assessed.    Session 2 General: "I feel great!" Pt seated in recliner upon OT arrival, agreeable to OT.  Pain: no pain reported  ADL: Functional mobility: pt ambulating throughout therapy unit at distant supervision without AD and no LOB/SOB  Exercises: Pt completed 15 minutes of nu  step bike in order to increase BUE/BLE strength and endurance in preparation for increased independence in ADLs such as functional mobility/transfers. No rest break, on level 6 resistance. Pt keeping moderate stedy pace throughout exercise.  Pt completed the following exercise circuit in order to improve functional activity, strength and endurance to prepare for ADLs such as bathing. Pt completed the following exercises in seated/standing position with no noted LOB/SOB and 2x10 repetitions on each exercise with 4# dowel rod: -bicep curls -sit to stands -rowing   Pt seated in recliner at end of session with W/C alarm donned, call light within reach and 4Ps assessed.    Therapy/Group: Individual Therapy  Velia Meyer, OTD, OTR/L 12/23/2022, 3:34 PM

## 2022-12-23 NOTE — Progress Notes (Signed)
PROGRESS NOTE   Subjective/Complaints:   Vital stable, no acute concerns aside from ongoing chest pain and cough, which she states is significantly improving.  Does not wish for any kind of treatment at this time.  No other complaints. No events overnight.  Discussed ongoing need at home for accurate volume and respiratory status assessments, including daily weights.  Also discussed if pain medications needed for therapies, premedicating rather than reactive to therapies.  ROS: Bilateral leg pain-attributing to difficult therapies-stable Positive for chest pain, cough-ongoing Pt denies SOB, abd pain, CP, N/V/C/D, and vision changes   Objective:   No results found. No results for input(s): "WBC", "HGB", "HCT", "PLT" in the last 72 hours.  No results for input(s): "NA", "K", "CL", "CO2", "GLUCOSE", "BUN", "CREATININE", "CALCIUM" in the last 72 hours.   Intake/Output Summary (Last 24 hours) at 12/23/2022 0903 Last data filed at 12/23/2022 0735 Gross per 24 hour  Intake 1800 ml  Output 600 ml  Net 1200 ml        Physical Exam: Vital Signs Blood pressure 114/70, pulse 69, temperature 98.1 F (36.7 C), resp. rate 16, height 6\' 2"  (1.88 m), weight 123.3 kg, SpO2 97%.    PE: Constitution: Appropriate appearance for age. No apparent distress.  Sitting in bedside chair. Resp: No respiratory distress. No accessory muscle usage. on RA and CTAB Cardio: Regular rate, irregular rhythm..  Systolic murmur.  Well perfused appearance.  Trace bilateral lower extremity peripheral edema; unchanged. Abdomen: Nondistended. Nontender.  Positive bowel sounds, normoactive. Psych: Appropriate mood and affect. Neuro: AAOx4. No apparent cognitive deficits  Skin: Mild sternal bruising, no apparent deformity.  Neurologic Exam:   Motor exam: strength 5/5 throughout bilateral upper extremities and bilateral lower extremities Coordination:  Fine motor coordination was normal.       Assessment/Plan: 1. Functional deficits which require 3+ hours per day of interdisciplinary therapy in a comprehensive inpatient rehab setting. Physiatrist is providing close team supervision and 24 hour management of active medical problems listed below. Physiatrist and rehab team continue to assess barriers to discharge/monitor patient progress toward functional and medical goals  Care Tool:  Bathing    Body parts bathed by patient: Right arm, Face, Left arm, Left lower leg, Chest, Abdomen, Front perineal area, Right upper leg, Buttocks, Left upper leg, Right lower leg         Bathing assist Assist Level: Supervision/Verbal cueing     Upper Body Dressing/Undressing Upper body dressing   What is the patient wearing?: Pull over shirt    Upper body assist Assist Level: Independent    Lower Body Dressing/Undressing Lower body dressing      What is the patient wearing?: Pants     Lower body assist Assist for lower body dressing: Supervision/Verbal cueing     Toileting Toileting    Toileting assist Assist for toileting: Supervision/Verbal cueing     Transfers Chair/bed transfer  Transfers assist     Chair/bed transfer assist level: Supervision/Verbal cueing     Locomotion Ambulation   Ambulation assist      Assist level: Supervision/Verbal cueing Assistive device: No Device Max distance: >153ft   Walk 10 feet activity   Assist  Assist level: Supervision/Verbal cueing Assistive device: No Device   Walk 50 feet activity   Assist    Assist level: Supervision/Verbal cueing Assistive device: No Device    Walk 150 feet activity   Assist    Assist level: Supervision/Verbal cueing Assistive device: No Device    Walk 10 feet on uneven surface  activity   Assist     Assist level: Supervision/Verbal cueing     Wheelchair     Assist Is the patient using a wheelchair?: No              Wheelchair 50 feet with 2 turns activity    Assist            Wheelchair 150 feet activity     Assist          Blood pressure 114/70, pulse 69, temperature 98.1 F (36.7 C), resp. rate 16, height 6\' 2"  (1.88 m), weight 123.3 kg, SpO2 97%.    Medical Problem List and Plan: 1. Debility 2/2 cardiac arrest             -patient may shower             -ELOS/Goals: modI 5-7 days -is already functionally independent 11-23, can likely discharge Tuesday             Con't CIR PT and OT  IPOC today  Pt spoke with someone from CT surgery, per pt- don't see note  2.  Antithrombotics: -DVT/anticoagulation:  Pharmaceutical: Eliquis             -antiplatelet therapy: aspirin 81 mg daily   3. Pain Management: Tylenol and Oxy as needed   11/21- pain controlled with oxycodone as needed 4. Mood/Behavior/Sleep: LCSW to evaluate and provide emotional support             -continue trazodone 100 mg at bedtime prn             -antipsychotic agents: n/a   5. Neuropsych/cognition: This patient is capable of making decisions on his own behalf.   6. Skin/Wound Care: Routine skin care checks   7. Fluids/Electrolytes/Nutrition: Strict Is and Os and follow-up chemistries   8: Hypertension: monitor TID and prn   11/21- BP soft- but con't regimen- no dizziness  11/22- BP doing better- in 110s systolic- con't regimen  11-23: Normotensive, no changes    12/23/2022    5:48 AM 12/22/2022    7:36 PM 12/22/2022   12:59 PM  Vitals with BMI  Systolic 114 111 829  Diastolic 70 69 77  Pulse 69 70 69     9: Hyperlipidemia: continue statin   10: Acute systolic CHF (cardiogenic shock>>resolved)             -daily weight             -continue digoxin 0.125 mg daily             -continue spironolactone 25 mg daily             -continue losartan 25 mg daily             -continue metoprolol succinate 50 mg daily             -continue torsemide 40 mg daily             --continue  amiodarone 400 mg BID (decrease to 200 mg BID 11/21)   11/21- Will remove Unna boots and place TEDs since looking great  -  Weight 126.3 kg on the bed scale-   11/22- Weight 119.5 kg today! Not sure if they switched scales?- will f/u on this  11-23: Weights increased from yesterday, but decreased overall.  No external signs of fluid overload.  No changes today, monitor  11-24: Daily weight stable.  Discussed home monitoring, including weighing at the same time every day, and limiting external factors such as clothing. Filed Weights   12/22/22 0500 12/23/22 0719 12/23/22 1602  Weight: 123.3 kg 124 kg 124 kg     11: CAD:             -plan CABG 12/02             -follow-up with Dr. Laneta Simmers             -continue asa 81 mg daily   12: Atrial fibrillation with RVR now in NSR:             -on Eliquis 5 mg BID             -continue digoxin 0.125 mg daily             -continue amiodarone 400 mg BID   11/22- continues in Afib  13: BLE edema: unna boot dressing placed on acute; denies ulceration             -consider TEDs and remove unna wraps in AM   11/21- will d/c Unna boots and start TEDs  11/22- going OK per pt  11-23: Trace, baseline per patient.  14: Alcohol use: cessation counseling   15: Morbid Obesity: BMI = 35.68- due to CHF   11/21- BMI 35.76 16: Anemia: normal serum iron, elevated ferritin -follow-up CBC   11/22- Hb 10.2- up from low 9's 17: Hyponatremia: trending up slowly -follow-up BMP 11/22- Na 131    18: Intermittent productive cough             -continue Mucinex             -check PA/Lat chest x-ray             -continue IS   11/21- per pt, almost gone.   11/22- virtually gone per pt- and the pain that went with it  11-23: Reviewed importance of pulmonary hygiene with incentive spirometer.  Discussed risks and benefits associated with cough suppressant; patient agrees with decision to forego these  11-24: Symptoms stable  LOS: 4 days A FACE TO FACE EVALUATION  WAS PERFORMED  Angelina Sheriff 12/23/2022, 9:03 AM

## 2022-12-23 NOTE — Progress Notes (Signed)
Occupational Therapy Discharge Summary  Patient Details  Name: Jeremiah Dixon MRN: [id number] Date of Birth: 06/30/1960  Date of Discharge from OT service:{Time; dates multiple:304500300}  {CHL IP REHAB OT TIME CALCULATIONS:304400400}   Patient has met {NUMBERS 0-12:18577} of {NUMBERS 0-12:18577} long term goals due to {due AO:1308657}.  Patient to discharge at overall {LOA:3049010} level.  Patient's care partner {care partner:3041650} to provide the necessary {assistance:3041652} assistance at discharge.    Reasons goals not met: ***  Recommendation:  Patient will benefit from ongoing skilled OT services in {setting:3041680} to continue to advance functional skills in the area of {ADL/iADL:3041649}.  Equipment: {equipment:3041657}  Reasons for discharge: {Reason for discharge:3049018}  Patient/family agrees with progress made and goals achieved: {Pt/Family agree with progress/goals:3049020}  OT Discharge Precautions/Restrictions    General   Vital Signs Therapy Vitals Temp: 98.1 F (36.7 C) Pulse Rate: 69 Resp: 16 BP: 114/70 Patient Position (if appropriate): Lying Oxygen Therapy SpO2: 97 % O2 Device: Room Air Pain   ADL ADL Eating: Set up Grooming: Supervision/safety Where Assessed-Grooming: Standing at sink Upper Body Bathing: Supervision/safety Where Assessed-Upper Body Bathing: Shower Lower Body Bathing: Supervision/safety Where Assessed-Lower Body Bathing: Shower Upper Body Dressing: Supervision/safety Where Assessed-Upper Body Dressing: Standing at sink Lower Body Dressing: Supervision/safety Where Assessed-Lower Body Dressing: Standing at sink Toileting: Supervision/safety Where Assessed-Toileting: Teacher, adult education: Close supervision Toilet Transfer Method: Ambulating Tub/Shower Transfer: Close supervison Tub/Shower Transfer Method: Ship broker: Acupuncturist: Close supervision Training and development officer Method: Designer, industrial/product: Grab bars ADL Comments: Pt able to stand while completing whole shower using grab bar when standing on one leg to wash feet, able to dress while standing while holding onto wall for balance without LOB/SOB. Pt able to don TEDs and socks using figure 4 position seated in chair. Pt required reminders to take rest breaks for activity pacing. Pt able to stand while grooming at sink. Vision   Perception    Praxis   Cognition   Sensation   Motor    Mobility     Trunk/Postural Assessment     Balance   Extremity/Trunk Assessment       [name] 12/23/2022, 7:52 AM

## 2022-12-24 ENCOUNTER — Other Ambulatory Visit (HOSPITAL_COMMUNITY): Payer: Self-pay

## 2022-12-24 ENCOUNTER — Other Ambulatory Visit: Payer: Self-pay | Admitting: *Deleted

## 2022-12-24 DIAGNOSIS — I2510 Atherosclerotic heart disease of native coronary artery without angina pectoris: Secondary | ICD-10-CM | POA: Diagnosis not present

## 2022-12-24 DIAGNOSIS — R5381 Other malaise: Secondary | ICD-10-CM | POA: Diagnosis not present

## 2022-12-24 LAB — BASIC METABOLIC PANEL
Anion gap: 11 (ref 5–15)
BUN: 13 mg/dL (ref 8–23)
CO2: 28 mmol/L (ref 22–32)
Calcium: 9.3 mg/dL (ref 8.9–10.3)
Chloride: 94 mmol/L — ABNORMAL LOW (ref 98–111)
Creatinine, Ser: 1.08 mg/dL (ref 0.61–1.24)
GFR, Estimated: >60 mL/min (ref >60)
Glucose, Bld: 93 mg/dL (ref 70–99)
Potassium: 4 mmol/L (ref 3.5–5.1)
Sodium: 133 mmol/L — ABNORMAL LOW (ref 135–145)

## 2022-12-24 LAB — CBC
HCT: 33.8 % — ABNORMAL LOW (ref 39.0–52.0)
Hemoglobin: 11.1 g/dL — ABNORMAL LOW (ref 13.0–17.0)
MCH: 31 pg (ref 26.0–34.0)
MCHC: 32.8 g/dL (ref 30.0–36.0)
MCV: 94.4 fL (ref 80.0–100.0)
Platelets: 402 10*3/uL — ABNORMAL HIGH (ref 150–400)
RBC: 3.58 MIL/uL — ABNORMAL LOW (ref 4.22–5.81)
RDW: 13.3 % (ref 11.5–15.5)
WBC: 4.7 10*3/uL (ref 4.0–10.5)
nRBC: 0 % (ref 0.0–0.2)

## 2022-12-24 MED ORDER — APIXABAN 5 MG PO TABS
5.0000 mg | ORAL_TABLET | Freq: Two times a day (BID) | ORAL | 0 refills | Status: DC
  Filled 2022-12-24: qty 60, 30d supply, fill #0

## 2022-12-24 MED ORDER — ASPIRIN 81 MG PO CHEW
81.0000 mg | CHEWABLE_TABLET | Freq: Every day | ORAL | 0 refills | Status: AC
  Filled 2022-12-24: qty 30, 30d supply, fill #0

## 2022-12-24 MED ORDER — GUAIFENESIN ER 600 MG PO TB12
600.0000 mg | ORAL_TABLET | Freq: Two times a day (BID) | ORAL | 0 refills | Status: DC
  Filled 2022-12-24: qty 30, 15d supply, fill #0

## 2022-12-24 MED ORDER — ROSUVASTATIN CALCIUM 20 MG PO TABS
20.0000 mg | ORAL_TABLET | Freq: Every day | ORAL | 0 refills | Status: DC
  Filled 2022-12-24: qty 30, 30d supply, fill #0

## 2022-12-24 MED ORDER — TRAZODONE HCL 100 MG PO TABS
100.0000 mg | ORAL_TABLET | Freq: Every evening | ORAL | 0 refills | Status: DC | PRN
  Filled 2022-12-24: qty 30, 30d supply, fill #0

## 2022-12-24 MED ORDER — SPIRONOLACTONE 25 MG PO TABS
25.0000 mg | ORAL_TABLET | Freq: Every day | ORAL | 0 refills | Status: DC
  Filled 2022-12-24: qty 30, 30d supply, fill #0

## 2022-12-24 MED ORDER — TORSEMIDE 20 MG PO TABS
40.0000 mg | ORAL_TABLET | Freq: Every day | ORAL | 0 refills | Status: DC
  Filled 2022-12-24: qty 30, 15d supply, fill #0

## 2022-12-24 MED ORDER — AMIODARONE HCL 200 MG PO TABS
200.0000 mg | ORAL_TABLET | Freq: Two times a day (BID) | ORAL | 0 refills | Status: DC
  Filled 2022-12-24: qty 60, 30d supply, fill #0

## 2022-12-24 MED ORDER — DIGOXIN 125 MCG PO TABS: 0.1250 mg | ORAL_TABLET | Freq: Every day | ORAL | 0 refills | Status: DC

## 2022-12-24 MED ORDER — METOPROLOL SUCCINATE ER 50 MG PO TB24
50.0000 mg | ORAL_TABLET | Freq: Every day | ORAL | 0 refills | Status: DC
  Filled 2022-12-24: qty 30, 30d supply, fill #0

## 2022-12-24 MED ORDER — PSYLLIUM 95 % PO PACK: 1.0000 | PACK | Freq: Every day | ORAL | Status: AC

## 2022-12-24 MED ORDER — LOSARTAN POTASSIUM 25 MG PO TABS
25.0000 mg | ORAL_TABLET | Freq: Every day | ORAL | 0 refills | Status: DC
  Filled 2022-12-24: qty 30, 30d supply, fill #0

## 2022-12-24 MED ORDER — ACETAMINOPHEN 325 MG PO TABS: 325.0000 mg | ORAL_TABLET | ORAL | Status: AC | PRN

## 2022-12-24 NOTE — Plan of Care (Signed)
  Problem: RH Grooming Goal: LTG Patient will perform grooming w/assist,cues/equip (OT) Description: LTG: Patient will perform grooming with assist, with/without cues using equipment (OT) Outcome: Completed/Met Flowsheets (Taken 12/20/2022 1601) LTG: Pt will perform grooming with assistance level of: Independent with assistive device    Problem: RH Bathing Goal: LTG Patient will bathe all body parts with assist levels (OT) Description: LTG: Patient will bathe all body parts with assist levels (OT) Outcome: Completed/Met Flowsheets (Taken 12/20/2022 1601) LTG: Pt will perform bathing with assistance level/cueing: Independent with assistive device    Problem: RH Dressing Goal: LTG Patient will perform upper body dressing (OT) Description: LTG Patient will perform upper body dressing with assist, with/without cues (OT). Outcome: Completed/Met Flowsheets (Taken 12/20/2022 1601) LTG: Pt will perform upper body dressing with assistance level of: Independent with assistive device Goal: LTG Patient will perform lower body dressing w/assist (OT) Description: LTG: Patient will perform lower body dressing with assist, with/without cues in positioning using equipment (OT) Outcome: Completed/Met Flowsheets (Taken 12/20/2022 1601) LTG: Pt will perform lower body dressing with assistance level of: Independent with assistive device   Problem: RH Toileting Goal: LTG Patient will perform toileting task (3/3 steps) with assistance level (OT) Description: LTG: Patient will perform toileting task (3/3 steps) with assistance level (OT)  Outcome: Completed/Met Flowsheets (Taken 12/20/2022 1601) LTG: Pt will perform toileting task (3/3 steps) with assistance level: Independent with assistive device   Problem: RH Light Housekeeping Goal: LTG Patient will perform light housekeeping w/assist (OT) Description: LTG: Patient will perform light housekeeping with assistance, with/without cues (OT). Outcome:  Completed/Met Flowsheets (Taken 12/20/2022 1601) LTG: Pt will perform light housekeeping with assistance level of: Independent with assistive device   Problem: RH Toilet Transfers Goal: LTG Patient will perform toilet transfers w/assist (OT) Description: LTG: Patient will perform toilet transfers with assist, with/without cues using equipment (OT) Outcome: Completed/Met Flowsheets (Taken 12/20/2022 1601) LTG: Pt will perform toilet transfers with assistance level of: Independent with assistive device   Problem: RH Tub/Shower Transfers Goal: LTG Patient will perform tub/shower transfers w/assist (OT) Description: LTG: Patient will perform tub/shower transfers with assist, with/without cues using equipment (OT) Outcome: Completed/Met Flowsheets (Taken 12/20/2022 1601) LTG: Pt will perform tub/shower stall transfers with assistance level of: Independent with assistive device

## 2022-12-24 NOTE — Progress Notes (Signed)
TCTS   Subjective:  He feels well, much stronger. Plan is for him to go home tomorrow prior to surgery on 12/2. He has not had any chest pain or SOB.  Objective: Vital signs in last 24 hours: Temp:  [97.5 F (36.4 C)-98.1 F (36.7 C)] 98.1 F (36.7 C) (11/25 1422) Pulse Rate:  [64-74] 71 (11/25 1422) Resp:  [16-18] 18 (11/25 1422) BP: (103-126)/(66-85) 122/77 (11/25 1422) SpO2:  [96 %-98 %] 97 % (11/25 1422)  Hemodynamic parameters for last 24 hours:    Intake/Output from previous day: 11/24 0701 - 11/25 0700 In: 600 [P.O.:600] Out: -  Intake/Output this shift: Total I/O In: 480 [P.O.:480] Out: -   General appearance: alert and cooperative Heart: regular rate and rhythm Lungs: clear to auscultation bilaterally Extremities: no edema  Lab Results: Recent Labs    12/24/22 0538  WBC 4.7  HGB 11.1*  HCT 33.8*  PLT 402*   BMET:  Recent Labs    12/24/22 0538  NA 133*  K 4.0  CL 94*  CO2 28  GLUCOSE 93  BUN 13  CREATININE 1.08  CALCIUM 9.3    PT/INR: No results for input(s): "LABPROT", "INR" in the last 72 hours. ABG    Component Value Date/Time   PHART 7.439 12/10/2022 1727   HCO3 23.5 12/10/2022 1727   TCO2 25 12/10/2022 1727   ACIDBASEDEF 4.0 (H) 12/06/2022 1608   O2SAT 65.4 12/15/2022 0550   CBG (last 3)  No results for input(s): "GLUCAP" in the last 72 hours.  Assessment/Plan:  Plan CABG and pulmonary isolation ablation/LAA clipping for atrial fib on 12/31/22.  I discussed the operative procedure with the patient including alternatives, benefits and risks; including but not limited to bleeding, blood transfusion, infection, stroke, myocardial infarction, graft failure, heart block requiring a permanent pacemaker, organ dysfunction, and death.  Shirlean Mylar understands and agrees to proceed.  My office will call him tomorrow to schedule preadmission and give surgery instructions.   LOS: 5 days    [name] 12/24/2022

## 2022-12-24 NOTE — Progress Notes (Signed)
Inpatient Rehabilitation Care Coordinator Discharge Note   Patient Details  Name: Jeremiah Dixon MRN: [id number] Date of Birth: February 27, 1960   Discharge location: Home with intermittent assist from brother-will be alone  Length of Stay: 5 days  Discharge activity level: independent level  Home/community participation: active  Patient response QI:ONGEXB Literacy - How often do you need to have someone help you when you read instructions, pamphlets, or other written material from your doctor or pharmacy?: Never  Patient response MW:UXLKGM Isolation - How often do you feel lonely or isolated from those around you?: Never  Services provided included: MD, RD, PT, OT, RN, CM, TR, Pharmacy, Neuropsych, SW  Financial Services:  Field seismologist Utilized: [employer]  Choices offered to/list presented to: na  Follow-up services arranged:  Other (Comment) (No DME or follow up recommended we'll need after surgery 12/2)           Patient response to transportation need: Is the patient able to respond to transportation needs?: Yes In the past 12 months, has lack of transportation kept you from medical appointments or from getting medications?: No In the past 12 months, has lack of transportation kept you from meetings, work, or from getting things needed for daily living?: No   Patient/Family verbalized understanding of follow-up arrangements:  Yes  Individual responsible for coordination of the follow-up plan: self [phone]  Confirmed correct DME delivered: Lucy Chris 12/24/2022    Comments (or additional information):Pt did well and recovered and regained strength able to go home alone and manage. Brother to check on him. Will need to get supports together for when he has CABG 12/2  Summary of Stay    Date/Time Discharge Planning CSW  12/24/22 0930 HOme to apartment with intermittent assist from brother. He is preparing to surgery 12/2. No  equipment needs and will need follow up once has surgery RGD       Tyleek Smick, Lemar Livings

## 2022-12-24 NOTE — Discharge Instructions (Addendum)
Inpatient Rehab Discharge Instructions  Jeremiah Dixon Discharge date and time: 12/25/2022   Activities/Precautions/ Functional Status: Activity: no lifting, driving, or strenuous exercise until cleared by MD Diet: cardiac diet Wound Care: none needed Functional status:  ___ No restrictions     ___ Walk up steps independently ___ 24/7 supervision/assistance   ___ Walk up steps with assistance _x__ Intermittent supervision/assistance  ___ Bathe/dress independently ___ Walk with walker     ___ Bathe/dress with assistance ___ Walk Independently    ___ Shower independently ___ Walk with assistance    ___ Shower with assistance _x__ No alcohol     ___ Return to work/school ________  Special Instructions: No driving, alcohol consumption or tobacco use.  COMMUNITY REFERRALS UPON DISCHARGE:    Home Exercise program given to patient will need follow up after surgery 12/2   Medical Equipment/Items Ordered:No needs                                                 Agency/Supplier:NA    My questions have been answered and I understand these instructions. I will adhere to these goals and the provided educational materials after my discharge from the hospital.  Patient/Caregiver Signature _______________________________ Date __________  Clinician Signature _______________________________________ Date __________  Please bring this form and your medication list with you to all your follow-up doctor's appointments.      Information on my medicine - ELIQUIS (apixaban)  Why was Eliquis prescribed for you? Eliquis was prescribed to treat blood clots that may have been found in the veins of your legs (deep vein thrombosis) or in your lungs (pulmonary embolism) and to reduce the risk of them occurring again.  What do You need to know about Eliquis ? The dose is ONE 5 mg tablet taken TWICE daily.  Eliquis may be taken with or without food.   Try to take the dose about the same time  in the morning and in the evening. If you have difficulty swallowing the tablet whole please discuss with your pharmacist how to take the medication safely.  Take Eliquis exactly as prescribed and DO NOT stop taking Eliquis without talking to the doctor who prescribed the medication.  Stopping may increase your risk of developing a new blood clot.  Refill your prescription before you run out.  After discharge, you should have regular check-up appointments with your healthcare provider that is prescribing your Eliquis.    What do you do if you miss a dose? If a dose of ELIQUIS is not taken at the scheduled time, take it as soon as possible on the same day and twice-daily administration should be resumed. The dose should not be doubled to make up for a missed dose.  Important Safety Information A possible side effect of Eliquis is bleeding. You should call your healthcare provider right away if you experience any of the following: Bleeding from an injury or your nose that does not stop. Unusual colored urine (red or dark brown) or unusual colored stools (red or black). Unusual bruising for unknown reasons. A serious fall or if you hit your head (even if there is no bleeding).  Some medicines may interact with Eliquis and might increase your risk of bleeding or clotting while on Eliquis. To help avoid this, consult your healthcare provider or pharmacist prior to using any new prescription or  non-prescription medications, including herbals, vitamins, non-steroidal anti-inflammatory drugs (NSAIDs) and supplements.  This website has more information on Eliquis (apixaban): http://[email or web address].com/eliquis/home

## 2022-12-24 NOTE — Patient Care Conference (Signed)
Inpatient RehabilitationTeam Conference and Plan of Care Update Date: 12/24/2022   Time: 8:57 AM    Patient Name: Jeremiah Dixon      Medical Record Number: [id number]  Date of Birth: 03-26-1960 Sex: Male         Room/Bed: 4W13C/4W13C-01 Payor Info: Payor: [employer] / Plan: CIG[employer] MANAGED / Product Type: *No Product type* /    Admit Date/Time:  12/19/2022  2:15 PM  Primary Diagnosis:  Debility  Hospital Problems: Principal Problem:   Debility    Expected Discharge Date: Expected Discharge Date: 12/25/22  Team Members Present: Physician leading conference: Dr. Genice Rouge Social Worker Present: Dossie Der, LCSW Nurse Present: Vedia Pereyra, RN PT Present: [name], PT OT Present: Velia Meyer, OT     Current Status/Progress Goal Weekly Team Focus  Bowel/Bladder    Continent of bowel/bladder LBM 12/22/22   Remain continent with resolving constipation  Offer toileting when needed and stool softeners/laxatives as needed.    Swallow/Nutrition/ Hydration               ADL's   independent with all ADLs, no AD necessary   independent with all ADLs   D/C planning for 11/26    Mobility   independent bed mobility, transfers, gait 800 ft and 2 flights of stairs with rails   independent  discharge planning and pt/education for prehab    Communication                Safety/Cognition/ Behavioral Observations               Pain    Occasional chest pain managed with PRN medications.   Less than 4 with PRN medications   Assess every 4 hours and prior to therapy sessions   Skin    Scattered abrasions and bruising  Skin to remain intact and continue to heal  Assess every shift and as needed      Discharge Planning:  HOme to apartment with intermittent assist from brother. He is preparing to surgery 12/2. No equipment needs and will need follow up once has surgery   Team Discussion: Debility. Continent of bowel/bladder. Pain managed with PRN  medications. Encouraging IS use. Skin is intact.  Therapies going well. CABG scheduled 12/31/22 Patient on target to meet rehab goals: yes, meeting goals with discharge date of 12/25/22  *See Care Plan and progress notes for long and short-term goals.   Revisions to Treatment Plan:  Monitor labs and VS  Teaching Needs: Medications, safety, self care, gait/transfer training, skin care, etc.   Current Barriers to Discharge: Decreased caregiver support  Possible Resolutions to Barriers: Intermittent assist from brother No DME needed at this time.      Medical Summary Current Status: Debility with cardiogenic shock- rib fracture and pain wiht coughing- mucus congestion- improving; mild, now, LE edema  Barriers to Discharge: Behavior/Mood;Cardiac Complications;Self-care education   Possible Resolutions to [employer] Focus: limtied by fair STM; rib fractures/pain; d/c tomorrow- will need to send home on Pain meds as needed and monitoring daily weights, intake/output due ot cardiogenic shock- will get CABG 12/31/22- is already scheduled   Continued Need for Acute Rehabilitation Level of Care: The patient requires daily medical management by a physician with specialized training in physical medicine and rehabilitation for the following reasons: Direction of a multidisciplinary physical rehabilitation program to maximize functional independence : Yes Medical management of patient stability for increased activity during participation in an intensive rehabilitation regime.: Yes Analysis of laboratory values  and/or radiology reports with any subsequent need for medication adjustment and/or medical intervention. : Yes   I attest that I was present, lead the team conference, and concur with the assessment and plan of the team.   Jearld Adjutant 12/24/2022, 4:06 PM

## 2022-12-24 NOTE — Progress Notes (Signed)
Occupational Therapy Session Note  Patient Details  Name: Jeremiah Dixon MRN: [id number] Date of Birth: 05/29/1960  Today's Date: 12/24/2022 OT Individual Time: 4098-1191 OT Individual Time Calculation (min): 70 min    Short Term Goals: Week 1:  OT Short Term Goal 1 (Week 1): STG=LTG d/t ELOS  Skilled Therapeutic Interventions/Progress Updates:    Pt resting in recliner upon arrival. Pt requested to use toilet before leaving room. All amb during session wihtout AD at distant supervision. Pt amb to ortho gym. Pt completed 15 mins NuStep level 6. Pt requested to complete additional 15 mins on level 8. Pt requested to use toilet again and amb to bathroom. Pt amb to gym and practiced sit<>stand from low chair. Pt required assistance to complete task from low surface. Educated pt on awareness to any surface he elects to sit on. Pt amb to day room and completed sit<>stand from EOM 3x10. Pt returned to room and remained in recliner. All needs within reach.   Therapy Documentation Precautions:  Precautions Precautions: Fall, Sternal Precaution Comments: has sternal pillow Restrictions Weight Bearing Restrictions: No RUE Weight Bearing: Non weight bearing Pain:  Pt denies pain this morning  Therapy/Group: Individual Therapy  Rich Brave 12/24/2022, 11:57 AM

## 2022-12-24 NOTE — Progress Notes (Signed)
PROGRESS NOTE   Subjective/Complaints:   Pt reports only coughing ~ 2-3x/hour at mos-t but when coughs pain "10/10 and sneezes 15/10".  Most of congestion is cleared.  LBM x2 yesterday . Little stiff in R knee- not actually painful= occ popping as well.  Also has "bowing of R leg"- due ot knee problems per pt-  Had Saturday off which was perfect for him.  Swelling has improved  ROS: Bilateral leg pain-attributing to difficult therapies-stable Positive for chest pain, cough-ongoing  Pt denies SOB, abd pain, CP, N/V/C/D, and vision changes    Objective:   No results found. Recent Labs    12/24/22 0538  WBC 4.7  HGB 11.1*  HCT 33.8*  PLT 402*    Recent Labs    12/24/22 0538  NA 133*  K 4.0  CL 94*  CO2 28  GLUCOSE 93  BUN 13  CREATININE 1.08  CALCIUM 9.3     Intake/Output Summary (Last 24 hours) at 12/24/2022 0849 Last data filed at 12/24/2022 0810 Gross per 24 hour  Intake 840 ml  Output --  Net 840 ml        Physical Exam: Vital Signs Blood pressure 103/66, pulse 73, temperature (!) 97.5 F (36.4 C), temperature source Oral, resp. rate 18, height 6\' 2"  (1.88 m), weight 124 kg, SpO2 96%.    PE:   General: awake, alert, appropriate, sitting up in bed; finished 100% tray;  NAD HENT: conjugate gaze; oropharynx moist CV: regular rate- irregular rhythm; systolic murmur- ; no JVD Pulmonary: CTA B/L; no W/R/R-slighly decreased at bases b/L  GI: soft, NT, ND, (+)BS- normoactive Psychiatric: appropriate- focused on coughing, but is better Neurological: Ox3 Fair STM Skin: Mild sternal bruising, no apparent deformity.  Neurologic Exam:   Motor exam: strength 5/5 throughout bilateral upper extremities and bilateral lower extremities Coordination: Fine motor coordination was normal.       Assessment/Plan: 1. Functional deficits which require 3+ hours per day of interdisciplinary therapy in a  comprehensive inpatient rehab setting. Physiatrist is providing close team supervision and 24 hour management of active medical problems listed below. Physiatrist and rehab team continue to assess barriers to discharge/monitor patient progress toward functional and medical goals  Care Tool:  Bathing    Body parts bathed by patient: Right arm, Face, Left arm, Left lower leg, Chest, Abdomen, Front perineal area, Right upper leg, Buttocks, Left upper leg, Right lower leg         Bathing assist Assist Level: Supervision/Verbal cueing     Upper Body Dressing/Undressing Upper body dressing   What is the patient wearing?: Pull over shirt    Upper body assist Assist Level: Independent    Lower Body Dressing/Undressing Lower body dressing      What is the patient wearing?: Pants     Lower body assist Assist for lower body dressing: Supervision/Verbal cueing     Toileting Toileting    Toileting assist Assist for toileting: Supervision/Verbal cueing     Transfers Chair/bed transfer  Transfers assist     Chair/bed transfer assist level: Supervision/Verbal cueing     Locomotion Ambulation   Ambulation assist      Assist  level: Supervision/Verbal cueing Assistive device: No Device Max distance: >198ft   Walk 10 feet activity   Assist     Assist level: Supervision/Verbal cueing Assistive device: No Device   Walk 50 feet activity   Assist    Assist level: Supervision/Verbal cueing Assistive device: No Device    Walk 150 feet activity   Assist    Assist level: Supervision/Verbal cueing Assistive device: No Device    Walk 10 feet on uneven surface  activity   Assist     Assist level: Supervision/Verbal cueing     Wheelchair     Assist Is the patient using a wheelchair?: No             Wheelchair 50 feet with 2 turns activity    Assist            Wheelchair 150 feet activity     Assist          Blood  pressure 103/66, pulse 73, temperature (!) 97.5 F (36.4 C), temperature source Oral, resp. rate 18, height 6\' 2"  (1.88 m), weight 124 kg, SpO2 96%.    Medical Problem List and Plan: 1. Debility 2/2 cardiac arrest             -patient may shower             -ELOS/Goals: modI 5-7 days -is already functionally independent 11-23, can likely discharge Tuesday           Con't CIR PT and OT  D/c tomorrow- - is oding great-  2.  Antithrombotics: -DVT/anticoagulation:  Pharmaceutical: Eliquis 11/25- Eliquis 5 mg BID             -antiplatelet therapy: aspirin 81 mg daily   3. Pain Management: Tylenol and Oxy as needed   11/21- pain controlled with oxycodone as needed  11/25- taking oxy as needed for rib fracture pain- last dose last night- pain controlled this AM 4. Mood/Behavior/Sleep: LCSW to evaluate and provide emotional support             -continue trazodone 100 mg at bedtime prn             -antipsychotic agents: n/a   5. Neuropsych/cognition: This patient is capable of making decisions on his own behalf.   6. Skin/Wound Care: Routine skin care checks   7. Fluids/Electrolytes/Nutrition: Strict Is and Os and follow-up chemistries   8: Hypertension: monitor TID and prn   11/21- BP soft- but con't regimen- no dizziness  11/22- BP doing better- in 110s systolic- con't regimen  11-23: Normotensive, no changes  11/25- BP a little sof,t butdenies dizziness- con't regimen    12/24/2022    8:14 AM 12/24/2022    8:12 AM 12/24/2022    7:27 AM  Vitals with BMI  Systolic  103 103  Diastolic  66 66  Pulse 73 73 73     9: Hyperlipidemia: continue statin   10: Acute systolic CHF (cardiogenic shock>>resolved)             -daily weight             -continue digoxin 0.125 mg daily             -continue spironolactone 25 mg daily             -continue losartan 25 mg daily             -continue metoprolol succinate 50 mg daily             -  continue torsemide 40 mg daily              --continue amiodarone 400 mg BID (decrease to 200 mg BID 11/21)   11/21- Will remove Unna boots and place TEDs since looking great  - Weight 126.3 kg on the bed scale-   11/22- Weight 119.5 kg today! Not sure if they switched scales?- will f/u on this  11-23: Weights increased from yesterday, but decreased overall.  No external signs of fluid overload.  No changes today, monitor  11-24: Daily weight stable.  Discussed home monitoring, including weighing at the same time every day, and limiting external factors such as clothing.  11/25- last weight yesterday- but was stable Filed Weights   12/22/22 0500 12/23/22 0719 12/23/22 1602  Weight: 123.3 kg 124 kg 124 kg     11: CAD:             -plan CABG 12/02             -follow-up with Dr. Laneta Simmers             -continue asa 81 mg daily   12: Atrial fibrillation with RVR now in NSR:             -on Eliquis 5 mg BID             -continue digoxin 0.125 mg daily             -continue amiodarone 400 mg BID   11/22- continues in Afib  11/25- con't Eliquis and meds as above- stable 13: BLE edema: unna boot dressing placed on acute; denies ulceration             -consider TEDs and remove unna wraps in AM   11/21- will d/c Unna boots and start TEDs  11/22- going OK per pt  11-23: Trace, baseline per patient.  11/25- Trace to 1+ at most- doing much better 14: Alcohol use: cessation counseling   15: Morbid Obesity: BMI = 35.68- due to CHF   11/21- BMI 35.76  11/25- BMI 35.10 16: Anemia: normal serum iron, elevated ferritin -follow-up CBC   11/22- Hb 10.2- up from low 9's 17: Hyponatremia: trending up slowly -follow-up BMP 11/22- Na 131    18: Intermittent productive cough             -continue Mucinex             -check PA/Lat chest x-ray             -continue IS   11/21- per pt, almost gone.   11/22- virtually gone per pt- and the pain that went with it  11-23: Reviewed importance of pulmonary hygiene with incentive spirometer.   Discussed risks and benefits associated with cough suppressant; patient agrees with decision to forego these  11-24: Symptoms stable   I spent a total of 36   minutes on total care today- >50% coordination of care- due to  Review of labs; vitals, and d/w entire team about d/c tomorrow- will arrange for d/c- will need pain meds for 5 days at d/c- since didn't have surgery; but shouldn't need longer than that, I would think- or can reduce to Norco- doesn't need to see me in f/u.    LOS: 5 days A FACE TO FACE EVALUATION WAS PERFORMED  Kashia Brossard 12/24/2022, 8:49 AM

## 2022-12-24 NOTE — Progress Notes (Signed)
Patient ID: Jeremiah Dixon, male   DOB: [birth year], 62 y.o.   MRN: [id number]  Team feels ready for discharge tomorrow and MD feels medically stable. Has no equipment needs and will need follow up after surgery 12/2. Pt feels prepared to go home tomorrow.  Made aware of discharge time so he can schedule his ride.

## 2022-12-24 NOTE — Progress Notes (Signed)
Physical Therapy Session Note  Patient Details  Name: Jeremiah Dixon MRN: [id number] Date of Birth: 28-Jan-1961  Today's Date: 12/24/2022 PT Individual Time: 5188-4166 PT Individual Time Calculation (min): 48 min   Short Term Goals: Week 1:  PT Short Term Goal 1 (Week 1): STG=LTG due to LOS  Skilled Therapeutic Interventions/Progress Updates:    pt received in chair and agreeable to therapy. No complaint of pain.   Gait throughout session with supervision with no AD.  Session focused on stair navigation in hospital stairwell. Pt performed 3 x 22 stairs with landing, with rest breaks intermittently at top and bottom. Monitored vitals and borg RPE, discussing effort vs safety with pt. Pt performed both laterally and forward with 1 hand rail and with 2 handrails. With 2 rails, pt is CGA-supervision, with 1 rail min-CGA, and laterally CGA. X1 instance of near LOB requiring min a, but pt likely would have caught himself on next step (essentially missed step) without full LOB. Discussed lateral is most safe option when only 1 rail is available.   Pt returned to room and remained in recliner with all needs in reach.   Therapy Documentation Precautions:  Precautions Precautions: Fall, Sternal Precaution Comments: has sternal pillow Restrictions Weight Bearing Restrictions: No RUE Weight Bearing: Non weight bearing General:       Therapy/Group: Individual Therapy  Juluis Rainier 12/24/2022, 12:21 PM

## 2022-12-24 NOTE — Progress Notes (Signed)
Physical Therapy Discharge Summary  Patient Details  Name: Jeremiah Dixon MRN: [id number] Date of Birth: 1961-01-16  Date of Discharge from PT service:December 24, 2022  Today's Date: 12/24/2022 PT Individual Time: 1334-1415 PT Individual Time Calculation (min): 41 min    Patient has met 8 of 8 long term goals due to improved activity tolerance, improved balance, ability to compensate for deficits, and improved coordination.  Patient to discharge at an ambulatory level Independent.     Reasons goals not met: N/A   Equipment: No equipment provided  Reasons for discharge: treatment goals met and discharge from hospital  Patient/family agrees with progress made and goals achieved: Yes  PT Discharge   Pain Interference Pain Interference Pain Effect on Sleep: 1. Rarely or not at all Pain Interference with Therapy Activities: 1. Rarely or not at all Pain Interference with Day-to-Day Activities: 1. Rarely or not at all Cognition Overall Cognitive Status: Within Functional Limits for tasks assessed Arousal/Alertness: Awake/alert Memory: Appears intact Awareness: Appears intact Problem Solving: Appears intact Safety/Judgment: Appears intact Sensation Sensation Light Touch: Appears Intact Proprioception: Appears Intact Coordination Gross Motor Movements are Fluid and Coordinated: Yes Fine Motor Movements are Fluid and Coordinated: Yes Coordination and Movement Description: [facility] Finger Nose Finger Test: [facility] Heel Shin Test: [employer] [facility] Motor  Motor Motor: Within Functional Limits Motor - Skilled Clinical Observations: WFL, limited actiivty tolerance pending cardiac surgery 12/31/2022  Mobility Bed Mobility Bed Mobility: Rolling Right;Rolling Left;Supine to Sit;Sit to Supine Rolling Right: Independent Rolling Left: Independent Supine to Sit: Independent Sit to Supine: Independent Transfers Transfers: Sit to Stand;Stand to Sit Sit to Stand: Independent Stand to Sit:  Independent Stand Pivot Transfers: Independent Transfer (Assistive device): None Locomotion  Gait Ambulation: Yes Gait Assistance: Independent Gait Distance (Feet): 800 Feet Assistive device: None Gait Gait: Yes Gait Pattern: Within Functional Limits Gait velocity: WFL Stairs / Additional Locomotion Stairs: Yes Stairs Assistance: Independent Stair Management Technique: Two rails;One rail Right Number of Stairs: 44 Height of Stairs: 7.5 Ramp: Independent Curb: Independent Wheelchair Mobility Wheelchair Mobility: No  Trunk/Postural Assessment  Cervical Assessment Cervical Assessment: Within Functional Limits Thoracic Assessment Thoracic Assessment: Within Functional Limits Lumbar Assessment Lumbar Assessment: Within Functional Limits Postural Control Postural Control: Within Functional Limits  Balance Balance Balance Assessed: Yes Static Sitting Balance Static Sitting - Balance Support: Feet supported;No upper extremity supported Static Sitting - Level of Assistance: 7: Independent Dynamic Sitting Balance Dynamic Sitting - Balance Support: Feet supported;No upper extremity supported Dynamic Sitting - Level of Assistance: 7: Independent Static Standing Balance Static Standing - Balance Support: No upper extremity supported;During functional activity Static Standing - Level of Assistance: 7: Independent Dynamic Standing Balance Dynamic Standing - Balance Support: No upper extremity supported;During functional activity Dynamic Standing - Level of Assistance: 7: Independent Extremity Assessment      RLE Assessment RLE Assessment: Within Functional Limits RLE Strength RLE Overall Strength Comments: 4+/5 LLE Assessment LLE Assessment: Within Functional Limits LLE Strength LLE Overall Strength Comments: 4+/5  PT session with emphasis on discharge prep, planning, and assessment. Pt without reports of pain, limited 2/2 decreased activity tolerance due to high intensity  of exercise in previous sessions. Pt performed six minute walk test, see below for details. PT assessed pain interference, sensation, coordination, strength, and balance. Pt completed session with 10 squats with split stance alternating front foot. Pt independent with all mobility in session and made MOD I  in hospital room, nursing notified.    6 Min Walk Test:  Instructed patient to  ambulate as quickly and as safely as possible for 6 minutes using LRAD. Patient was allowed to take standing rest breaks without stopping the test, but if the patient required a sitting rest break the clock would be stopped and the test would be over.  Results: 800 feet (243 meters). Results indicate that the patient has reduced endurance with ambulation compared to age matched norms.  Age Matched Norms: 73-69 yo M: 24 F: 53, 70-79 yo M: 78 F: 471, 76-89 yo M: 417 F: 392 MDC: 58.21 meters (190.98 feet) or 50 meters (ANPTA Core Set of Outcome Measures for Adults with Neurologic Conditions, 2018)     [name] [name] PT, DPT  12/24/2022, 1:59 PM

## 2022-12-25 ENCOUNTER — Other Ambulatory Visit (HOSPITAL_COMMUNITY): Payer: Self-pay

## 2022-12-25 DIAGNOSIS — R5381 Other malaise: Secondary | ICD-10-CM | POA: Diagnosis not present

## 2022-12-25 MED ORDER — ENOXAPARIN (LOVENOX) PATIENT EDUCATION KIT
PACK | Freq: Once | Status: DC
  Filled 2022-12-25: qty 1

## 2022-12-25 MED ORDER — ENOXAPARIN (LOVENOX) PATIENT EDUCATION KIT
1.0000 | PACK | Freq: Once | 0 refills | Status: AC
  Filled 2022-12-25: qty 1, 1d supply, fill #0

## 2022-12-25 MED ORDER — ENOXAPARIN SODIUM 120 MG/0.8ML IJ SOSY
120.0000 mg | PREFILLED_SYRINGE | Freq: Two times a day (BID) | INTRAMUSCULAR | Status: DC
Start: 2022-12-25 — End: 2022-12-25

## 2022-12-25 MED ORDER — DIGOXIN 125 MCG PO TABS
0.1250 mg | ORAL_TABLET | Freq: Every day | ORAL | 0 refills | Status: DC
  Filled 2022-12-25: qty 30, 30d supply, fill #0

## 2022-12-25 MED ORDER — GUAIFENESIN ER 600 MG PO TB12
600.0000 mg | ORAL_TABLET | Freq: Two times a day (BID) | ORAL | 0 refills | Status: DC | PRN
  Filled 2022-12-25: qty 30, 15d supply, fill #0

## 2022-12-25 MED ORDER — ENOXAPARIN SODIUM 120 MG/0.8ML IJ SOSY
120.0000 mg | PREFILLED_SYRINGE | Freq: Two times a day (BID) | INTRAMUSCULAR | 0 refills | Status: DC
  Filled 2022-12-25: qty 8, 5d supply, fill #0

## 2022-12-25 NOTE — Progress Notes (Signed)
Inpatient Rehabilitation Discharge Medication Review by a Pharmacist  A complete drug regimen review was completed for this patient to identify any potential clinically significant medication issues.  High Risk Drug Classes Is patient taking? Indication by Medication  Antipsychotic No   Anticoagulant Yes Lovenox - PE  Antibiotic No   Opioid No   Antiplatelet Yes Aspirin - CAD  Hypoglycemics/insulin No   Vasoactive Medication Yes Metoprolol XL, spironolactone, digoxin, amiodarone,  torsemide - HTN, Afib, HFrEF    Chemotherapy No   Other Yes Esomeprazole - GERD Psyllium - constipation Rosuvastatin - HLD Tylenol - prn pain  Trazodone - prn sleep Nitroglycerin - prn chest pain Mucinex - cough     Type of Medication Issue Identified Description of Issue Recommendation(s)  Drug Interaction(s) (clinically significant)     Duplicate Therapy     Allergy     No Medication Administration End Date     Incorrect Dose     Additional Drug Therapy Needed     Significant med changes from prior encounter (inform family/care partners about these prior to discharge). PTA medications not resumed: amlodipine  PTA medications stopped: lovastatin, metoprolol IR  Losartan and apixaban on hold for CABG 12/2   Other       Clinically significant medication issues were identified that warrant physician communication and completion of prescribed/recommended actions by midnight of the next day:  No  Name of provider notified for urgent issues identified:   Provider Method of Notification:   Pharmacist comments:  Time spent performing this drug regimen review (minutes):  20   Loura Back, PharmD, BCPS Clinical Pharmacist Clinical phone for 12/25/2022 is [phone] 12/25/2022 7:14 AM

## 2022-12-25 NOTE — Progress Notes (Signed)
PROGRESS NOTE   Subjective/Complaints:   Pt reports already packed and ready to leave today- was going to have brother pick him up downstairs- I explained would rather him come upstairs and hear what PA goes over.   D/W cards pharmacy- to stop Losartan and Eliquis- and start Lovenox- asked nursing to teach before d/c.    ROS:   Pt denies SOB, abd pain, CP, N/V/C/D, and vision changes     Objective:   No results found. Recent Labs    12/24/22 0538  WBC 4.7  HGB 11.1*  HCT 33.8*  PLT 402*    Recent Labs    12/24/22 0538  NA 133*  K 4.0  CL 94*  CO2 28  GLUCOSE 93  BUN 13  CREATININE 1.08  CALCIUM 9.3     Intake/Output Summary (Last 24 hours) at 12/25/2022 0847 Last data filed at 12/25/2022 0458 Gross per 24 hour  Intake 240 ml  Output 600 ml  Net -360 ml        Physical Exam: Vital Signs Blood pressure 118/79, pulse 65, temperature 97.7 F (36.5 C), temperature source Oral, resp. rate 18, height 6\' 2"  (1.88 m), weight 124 kg, SpO2 95%.    PE:   General: awake, alert, appropriate, walking around room, from bathroom; NAD HENT: conjugate gaze; oropharynx moist CV: regular rate and irregular rhythm; no JVD Pulmonary: CTA B/L; no W/R/R- good air movement- no coughing this AM GI: soft, NT, ND, (+)BS- normoactive Psychiatric: appropriate Neurological: Ox3- fair STM  Skin: Mild sternal bruising, no apparent deformity.  Neurologic Exam:   Motor exam: strength 5/5 throughout bilateral upper extremities and bilateral lower extremities Coordination: Fine motor coordination was normal.       Assessment/Plan: 1. Functional deficits which require 3+ hours per day of interdisciplinary therapy in a comprehensive inpatient rehab setting. Physiatrist is providing close team supervision and 24 hour management of active medical problems listed below. Physiatrist and rehab team continue to assess  barriers to discharge/monitor patient progress toward functional and medical goals  Care Tool:  Bathing    Body parts bathed by patient: Right arm, Face, Left arm, Left lower leg, Chest, Abdomen, Front perineal area, Right upper leg, Buttocks, Left upper leg, Right lower leg         Bathing assist Assist Level: Independent     Upper Body Dressing/Undressing Upper body dressing   What is the patient wearing?: Pull over shirt    Upper body assist Assist Level: Independent    Lower Body Dressing/Undressing Lower body dressing      What is the patient wearing?: Pants, Underwear/pull up     Lower body assist Assist for lower body dressing: Independent     Toileting Toileting    Toileting assist Assist for toileting: Independent     Transfers Chair/bed transfer  Transfers assist     Chair/bed transfer assist level: Independent     Locomotion Ambulation   Ambulation assist      Assist level: Independent Assistive device: No Device Max distance: 800 ft   Walk 10 feet activity   Assist     Assist level: Independent Assistive device: No Device  Walk 50 feet activity   Assist    Assist level: Independent Assistive device: No Device    Walk 150 feet activity   Assist    Assist level: Independent Assistive device: No Device    Walk 10 feet on uneven surface  activity   Assist     Assist level: Independent     Wheelchair     Assist Is the patient using a wheelchair?: No             Wheelchair 50 feet with 2 turns activity    Assist            Wheelchair 150 feet activity     Assist          Blood pressure 118/79, pulse 65, temperature 97.7 F (36.5 C), temperature source Oral, resp. rate 18, height 6\' 2"  (1.88 m), weight 124 kg, SpO2 95%.    Medical Problem List and Plan: 1. Debility 2/2 cardiac arrest             -patient may shower             -ELOS/Goals: modI 5-7 days -is already  functionally independent 11-23, can likely discharge Tuesday           D/c today Team conference completed Will stop Losartan and Eliquis and start Lovenox 120 mg BID per cardiac pharmacist.  Send home with oxy for 5 days- using sporadically.  2.  Antithrombotics: -DVT/anticoagulation:  Pharmaceutical: Eliquis 11/25- Eliquis 5 mg BID 11/26- changed to Lovenox 120 mg BID per cards.              -antiplatelet therapy: aspirin 81 mg daily   3. Pain Management: Tylenol and Oxy as needed   11/21- pain controlled with oxycodone as needed  11/25- taking oxy as needed for rib fracture pain- last dose last night- pain controlled this AM  11/26- send home with 5 days of pain meds/oxycodone  4. Mood/Behavior/Sleep: LCSW to evaluate and provide emotional support             -continue trazodone 100 mg at bedtime prn             -antipsychotic agents: n/a   5. Neuropsych/cognition: This patient is capable of making decisions on his own behalf.   6. Skin/Wound Care: Routine skin care checks   7. Fluids/Electrolytes/Nutrition: Strict Is and Os and follow-up chemistries   8: Hypertension: monitor TID and prn   11/21- BP soft- but con't regimen- no dizziness  11/22- BP doing better- in 110s systolic- con't regimen  11-23: Normotensive, no changes  11/25- BP a little sof,t butdenies dizziness- con't regimen  11/26- holding Losartan due to CABG- should help BP being soft.     12/25/2022    4:52 AM 12/24/2022    7:47 PM 12/24/2022    2:22 PM  Vitals with BMI  Systolic 118 117 644  Diastolic 79 74 77  Pulse 65 72 71     9: Hyperlipidemia: continue statin   10: Acute systolic CHF (cardiogenic shock>>resolved)             -daily weight             -continue digoxin 0.125 mg daily             -continue spironolactone 25 mg daily             -continue losartan 25 mg daily             -  continue metoprolol succinate 50 mg daily             -continue torsemide 40 mg daily             --continue  amiodarone 400 mg BID (decrease to 200 mg BID 11/21)   11/21- Will remove Unna boots and place TEDs since looking great  - Weight 126.3 kg on the bed scale-   11/22- Weight 119.5 kg today! Not sure if they switched scales?- will f/u on this  11-23: Weights increased from yesterday, but decreased overall.  No external signs of fluid overload.  No changes today, monitor  11-24: Daily weight stable.  Discussed home monitoring, including weighing at the same time every day, and limiting external factors such as clothing.  11/25- last weight yesterday- but was stable  11/26- no weight this AM will ask nursing to check [facility] Weights   12/22/22 0500 12/23/22 0719 12/23/22 1602  Weight: 123.3 kg 124 kg 124 kg     11: CAD:             -plan CABG 12/02             -follow-up with Dr. Laneta Simmers             -continue asa 81 mg daily   12: Atrial fibrillation with RVR now in NSR:             -on Eliquis 5 mg BID             -continue digoxin 0.125 mg daily             -continue amiodarone 400 mg BID   11/22- continues in Afib  11/25- con't Eliquis and meds as above- stable   13: BLE edema: unna boot dressing placed on acute; denies ulceration             -consider TEDs and remove unna wraps in AM   11/21- will d/c Unna boots and start TEDs  11/22- going OK per pt  11-23: Trace, baseline per patient.  11/25- Trace to 1+ at most- doing much better 14: Alcohol use: cessation counseling   15: Morbid Obesity: BMI = 35.68- due to CHF   11/21- BMI 35.76  11/25- BMI 35.10 16: Anemia: normal serum iron, elevated ferritin -follow-up CBC   11/22- Hb 10.2- up from low 9's 17: Hyponatremia: trending up slowly -follow-up BMP 11/22- Na 131    11/26- Na 133 18: Intermittent productive cough             -continue Mucinex             -check PA/Lat chest x-ray             -continue IS   11/21- per pt, almost gone.   11/22- virtually gone per pt- and the pain that went with it  11-23: Reviewed  importance of pulmonary hygiene with incentive spirometer.  Discussed risks and benefits associated with cough suppressant; patient agrees with decision to forego these  11-24: Symptoms stable  11/215- no coughing seen- go home with ICS    I spent a total of 41    minutes on total care today- >50% coordination of care- due to  D/w pt about d/c plans as well as with cards Pharmacist- and will hold Eliquis- add Lovenox 120 mg BID til Saturday and hold Losartan before CABG 12/31/22   The patient is medically ready for discharge to home and will not need follow-up with  [facility] PM&R. In addition, they will need to follow up with their PCP, cardiology.     LOS: 6 days A FACE TO FACE EVALUATION WAS PERFORMED  Jeremiah Dixon 12/25/2022, 8:47 AM

## 2022-12-25 NOTE — Progress Notes (Signed)
INPATIENT REHABILITATION DISCHARGE NOTE   Discharge instructions by: Dois Davenport PA  Verbalized understanding:yes  Skin care/Wound care healing?none  Pain:none  IV's:none  Tubes/Drains:none  O2:none  Safety instructions:done  Patient belongings:done  Discharged ZO:XWRU  Discharged via:car  Notes:patient's brother in room; lovenox demonstration / teaching done; patient has no further questions

## 2022-12-25 NOTE — Progress Notes (Signed)
PHARMACY - ANTICOAGULATION CONSULT NOTE  Pharmacy Consult for apixaban > enoxaparin Indication: pulmonary embolus  Allergies  Allergen Reactions   Bee Venom Palpitations   Diclofenac Diarrhea   Amoxicillin Diarrhea and Nausea Only   Lisinopril Cough   Pantoprazole Nausea Only    Other Reaction(s): chest pain    Patient Measurements: Height: 6\' 2"  (188 cm) Weight: 124 kg (273 lb 5.9 oz) IBW/kg (Calculated) : 82.2    Vital Signs: Temp: 97.7 F (36.5 C) (11/26 0452) Temp Source: Oral (11/26 0452) BP: 118/79 (11/26 0452) Pulse Rate: 65 (11/26 0452)  Labs: Recent Labs    12/24/22 0538  HGB 11.1*  HCT 33.8*  PLT 402*  CREATININE 1.08    Estimated Creatinine Clearance: 99.2 mL/min (by C-G formula based on SCr of 1.08 mg/dL).   Medical History: Past Medical History:  Diagnosis Date   HLD (hyperlipidemia)    HTN (hypertension)    MI (myocardial infarction) (HCC)       Assessment: 62yom admitted with possible PE s/p thrombolytics during arrest.  Treated with heparin > apixaban Also found to have CAD with plans for CABG next week. In preparation for CABG  - hold apixaban -  last dose this am  Bridge with enoxaparin 1mg /kg = 120mg  q12h - 1st dose tonight last dose Saturday nite  Plan discussed with MD  Hold ARB prior to CABG as well - restart post op  Goal of Therapy:   Monitor platelets by anticoagulation protocol: Yes   Plan:  Stop apixaban after morning dose today  Begin enoxaparin 1mg /kg  = 120mg  q12hr at bedtime tonight - patient educated Last dose enoxaparn 11/30 pm  No enoxaparin on Sunday and planning CABG on Monday Dec 2   Ricarda Frame.D. CPP, BCPS Clinical Pharmacist [phone] 12/25/2022 9:05 AM

## 2022-12-26 NOTE — Pre-Procedure Instructions (Addendum)
Surgical Instructions   Your procedure is scheduled on December 31, 2022. Report to [facility] Main Entrance "A" at 5:30 A.M., then check in with the Admitting office. Any questions or running late day of surgery: call [phone]  Questions prior to your surgery date: call [phone], Monday-Friday, 8am-4pm. If you experience any cold or flu symptoms such as cough, fever, chills, shortness of breath, etc. between now and your scheduled surgery, please notify us at the above number.     Remember:  Do not eat or drink after midnight the night before your surgery    Take these medicines the morning of surgery with A SIP OF WATER: amiodarone (PACERONE)  digoxin (LANOXIN)  esomeprazole (NEXIUM)  metoprolol succinate (TOPROL-XL)  rosuvastatin (CRESTOR)    May take these medicines IF NEEDED: acetaminophen (TYLENOL)    Continue taking your ASPIRIN through the day before surgery. DO NOT take any the morning of surgery.  Follow prescribers instructions regarding your Lovenox injections   One week prior to surgery, STOP taking any Aleve, Naproxen, Ibuprofen, Motrin, Advil, Goody's, BC's, all herbal medications, fish oil, and non-prescription vitamins.                     Do NOT Smoke (Tobacco/Vaping) for 24 hours prior to your procedure.  If you use a CPAP at night, you may bring your mask/headgear for your overnight stay.   You will be asked to remove any contacts, glasses, piercing's, hearing aid's, dentures/partials prior to surgery. Please bring cases for these items if needed.    Patients discharged the day of surgery will not be allowed to drive home, and someone needs to stay with them for 24 hours.  SURGICAL WAITING ROOM VISITATION Patients may have no more than 2 support people in the waiting area - these visitors may rotate.   Pre-op nurse will coordinate an appropriate time for 1 ADULT support person, who may not rotate, to accompany patient in pre-op.  Children under  the age of 83 must have an adult with them who is not the patient and must remain in the main waiting area with an adult.  If the patient needs to stay at the hospital during part of their recovery, the visitor guidelines for inpatient rooms apply.  Please refer to the [facility] website for the visitor guidelines for any additional information.   If you received a COVID test during your pre-op visit  it is requested that you wear a mask when out in public, stay away from anyone that may not be feeling well and notify your surgeon if you develop symptoms. If you have been in contact with anyone that has tested positive in the last 10 days please notify you surgeon.      Pre-operative CHG Bathing Instructions   You can play a key role in reducing the risk of infection after surgery. Your skin needs to be as free of germs as possible. You can reduce the number of germs on your skin by washing with CHG (chlorhexidine gluconate) soap before surgery. CHG is an antiseptic soap that kills germs and continues to kill germs even after washing.   DO NOT use if you have an allergy to chlorhexidine/CHG or antibacterial soaps. If your skin becomes reddened or irritated, stop using the CHG and notify one of our RNs at [phone].              TAKE A SHOWER THE NIGHT BEFORE SURGERY AND THE DAY OF SURGERY  Please keep in mind the following:  DO NOT shave, including legs and underarms, 48 hours prior to surgery.   You may shave your face before/day of surgery.  Place clean sheets on your bed the night before surgery Use a clean washcloth (not used since being washed) for each shower. DO NOT sleep with pet's night before surgery.  CHG Shower Instructions:  Wash your face and private area with normal soap. If you choose to wash your hair, wash first with your normal shampoo.  After you use shampoo/soap, rinse your hair and body thoroughly to remove shampoo/soap residue.  Turn the water OFF and apply  half the bottle of CHG soap to a CLEAN washcloth.  Apply CHG soap ONLY FROM YOUR NECK DOWN TO YOUR TOES (washing for 3-5 minutes)  DO NOT use CHG soap on face, private areas, open wounds, or sores.  Pay special attention to the area where your surgery is being performed.  If you are having back surgery, having someone wash your back for you may be helpful. Wait 2 minutes after CHG soap is applied, then you may rinse off the CHG soap.  Pat dry with a clean towel  Put on clean pajamas    Additional instructions for the day of surgery: DO NOT APPLY any lotions, deodorants, cologne, or perfumes.   Do not wear jewelry or makeup Do not wear nail polish, gel polish, artificial nails, or any other type of covering on natural nails (fingers and toes) Do not bring valuables to the hospital. [facility] is not responsible for valuables/personal belongings. Put on clean/comfortable clothes.  Please brush your teeth.  Ask your nurse before applying any prescription medications to the skin.

## 2022-12-28 ENCOUNTER — Encounter (HOSPITAL_COMMUNITY)
Admit: 2022-12-28 | Discharge: 2022-12-28 | Disposition: A | Discharge status: Home / Self Care | Payer: Managed Care, Other (non HMO) | Source: Ambulatory Visit | Attending: Surgery | Admitting: Surgery

## 2022-12-28 ENCOUNTER — Other Ambulatory Visit: Payer: Self-pay

## 2022-12-28 ENCOUNTER — Encounter (HOSPITAL_COMMUNITY): Payer: Self-pay

## 2022-12-28 VITALS — BP 126/69 | HR 76 | Temp 98.4°F | Resp 18 | Ht 74.0 in | Wt 278.8 lb

## 2022-12-28 DIAGNOSIS — I5022 Chronic systolic (congestive) heart failure: Secondary | ICD-10-CM | POA: Insufficient documentation

## 2022-12-28 DIAGNOSIS — I4891 Unspecified atrial fibrillation: Secondary | ICD-10-CM | POA: Insufficient documentation

## 2022-12-28 DIAGNOSIS — Z86711 Personal history of pulmonary embolism: Secondary | ICD-10-CM | POA: Insufficient documentation

## 2022-12-28 DIAGNOSIS — Z01818 Encounter for other preprocedural examination: Secondary | ICD-10-CM

## 2022-12-28 DIAGNOSIS — E785 Hyperlipidemia, unspecified: Secondary | ICD-10-CM | POA: Insufficient documentation

## 2022-12-28 DIAGNOSIS — I252 Old myocardial infarction: Secondary | ICD-10-CM | POA: Insufficient documentation

## 2022-12-28 DIAGNOSIS — I110 Hypertensive heart disease with heart failure: Secondary | ICD-10-CM | POA: Insufficient documentation

## 2022-12-28 DIAGNOSIS — I2510 Atherosclerotic heart disease of native coronary artery without angina pectoris: Secondary | ICD-10-CM | POA: Insufficient documentation

## 2022-12-28 DIAGNOSIS — Z8674 Personal history of sudden cardiac arrest: Secondary | ICD-10-CM | POA: Insufficient documentation

## 2022-12-28 DIAGNOSIS — Z79899 Other long term (current) drug therapy: Secondary | ICD-10-CM | POA: Insufficient documentation

## 2022-12-28 DIAGNOSIS — Z01812 Encounter for preprocedural laboratory examination: Secondary | ICD-10-CM | POA: Insufficient documentation

## 2022-12-28 HISTORY — DX: Unspecified atrial fibrillation: I48.91

## 2022-12-28 HISTORY — DX: Atherosclerotic heart disease of native coronary artery without angina pectoris: I25.10

## 2022-12-28 HISTORY — DX: Cardiac arrhythmia, unspecified: I49.9

## 2022-12-28 HISTORY — DX: Heart failure, unspecified: I50.9

## 2022-12-28 LAB — COMPREHENSIVE METABOLIC PANEL
ALT: 32 U/L (ref 0–44)
AST: 26 U/L (ref 15–41)
Albumin: 3.6 g/dL (ref 3.5–5.0)
Alkaline Phosphatase: 75 U/L (ref 38–126)
Anion gap: 7 (ref 5–15)
BUN: 10 mg/dL (ref 8–23)
CO2: 28 mmol/L (ref 22–32)
Calcium: 9.3 mg/dL (ref 8.9–10.3)
Chloride: 99 mmol/L (ref 98–111)
Creatinine, Ser: 0.83 mg/dL (ref 0.61–1.24)
GFR, Estimated: >60 mL/min (ref >60)
Glucose, Bld: 118 mg/dL — ABNORMAL HIGH (ref 70–99)
Potassium: 3.9 mmol/L (ref 3.5–5.1)
Sodium: 134 mmol/L — ABNORMAL LOW (ref 135–145)
Total Bilirubin: 0.9 mg/dL (ref <1.2)
Total Protein: 7.4 g/dL (ref 6.5–8.1)

## 2022-12-28 LAB — CBC
HCT: 35.6 % — ABNORMAL LOW (ref 39.0–52.0)
Hemoglobin: 11.7 g/dL — ABNORMAL LOW (ref 13.0–17.0)
MCH: 31.5 pg (ref 26.0–34.0)
MCHC: 32.9 g/dL (ref 30.0–36.0)
MCV: 95.7 fL (ref 80.0–100.0)
Platelets: 339 10*3/uL (ref 150–400)
RBC: 3.72 MIL/uL — ABNORMAL LOW (ref 4.22–5.81)
RDW: 13.1 % (ref 11.5–15.5)
WBC: 4.8 10*3/uL (ref 4.0–10.5)
nRBC: 0 % (ref 0.0–0.2)

## 2022-12-28 LAB — URINALYSIS, ROUTINE W REFLEX MICROSCOPIC
Bilirubin Urine: NEGATIVE
Glucose, UA: NEGATIVE mg/dL
Hgb urine dipstick: NEGATIVE
Ketones, ur: NEGATIVE mg/dL
Leukocytes,Ua: NEGATIVE
Nitrite: NEGATIVE
Protein, ur: NEGATIVE mg/dL
Specific Gravity, Urine: 1.012 (ref 1.005–1.030)
pH: 6 (ref 5.0–8.0)

## 2022-12-28 LAB — APTT: aPTT: 35 s (ref 24–36)

## 2022-12-28 LAB — PROTIME-INR
INR: 1 (ref 0.8–1.2)
Prothrombin Time: 13.3 s (ref 11.4–15.2)

## 2022-12-28 LAB — SURGICAL PCR SCREEN
MRSA, PCR: NEGATIVE
Staphylococcus aureus: POSITIVE — AB

## 2022-12-28 LAB — HEMOGLOBIN A1C
Hgb A1c MFr Bld: 5.1 % (ref 4.8–5.6)
Mean Plasma Glucose: 99.67 mg/dL

## 2022-12-28 LAB — SARS CORONAVIRUS 2 BY RT PCR: SARS Coronavirus 2 by RT PCR: NEGATIVE

## 2022-12-28 MED ORDER — CEFAZOLIN IN SODIUM CHLORIDE 3-0.9 GM/100ML-% IV SOLN
3.0000 g | INTRAVENOUS | Status: AC
  Administered 2022-12-31: 3 g via INTRAVENOUS
  Filled 2022-12-28: qty 100

## 2022-12-28 MED ORDER — CEFAZOLIN SODIUM-DEXTROSE 2-4 GM/100ML-% IV SOLN
2.0000 g | INTRAVENOUS | Status: AC
  Administered 2022-12-31: 2 g via INTRAVENOUS
  Filled 2022-12-28: qty 100

## 2022-12-28 MED ORDER — DEXMEDETOMIDINE HCL IN NACL 400 MCG/100ML IV SOLN
0.1000 ug/kg/h | INTRAVENOUS | Status: AC
  Administered 2022-12-31: .4 ug/kg/h via INTRAVENOUS
  Administered 2022-12-31: .7 ug/kg/h via INTRAVENOUS
  Filled 2022-12-28: qty 100

## 2022-12-28 MED ORDER — PLASMA-LYTE A IV SOLN
INTRAVENOUS | Status: DC
  Filled 2022-12-28: qty 2.5

## 2022-12-28 MED ORDER — TRANEXAMIC ACID (OHS) BOLUS VIA INFUSION
15.0000 mg/kg | INTRAVENOUS | Status: AC
  Administered 2022-12-31: 1897.5 mg via INTRAVENOUS
  Filled 2022-12-28: qty 1898

## 2022-12-28 MED ORDER — MANNITOL 20 % IV SOLN
INTRAVENOUS | Status: DC
  Filled 2022-12-28: qty 13

## 2022-12-28 MED ORDER — TRANEXAMIC ACID (OHS) PUMP PRIME SOLUTION
2.0000 mg/kg | INTRAVENOUS | Status: DC
  Filled 2022-12-28: qty 2.53

## 2022-12-28 MED ORDER — NITROGLYCERIN IN D5W 200-5 MCG/ML-% IV SOLN
2.0000 ug/min | INTRAVENOUS | Status: DC
  Filled 2022-12-28: qty 250

## 2022-12-28 MED ORDER — MILRINONE LACTATE IN DEXTROSE 20-5 MG/100ML-% IV SOLN
0.3000 ug/kg/min | INTRAVENOUS | Status: DC
  Filled 2022-12-28: qty 100

## 2022-12-28 MED ORDER — NOREPINEPHRINE 4 MG/250ML-% IV SOLN
0.0000 ug/min | INTRAVENOUS | Status: AC
  Administered 2022-12-31: 3 ug/min via INTRAVENOUS
  Filled 2022-12-28: qty 250

## 2022-12-28 MED ORDER — HEPARIN 30,000 UNITS/1000 ML (OHS) CELLSAVER SOLUTION
Status: DC
  Filled 2022-12-28: qty 1000

## 2022-12-28 MED ORDER — INSULIN REGULAR(HUMAN) IN NACL 100-0.9 UT/100ML-% IV SOLN
INTRAVENOUS | Status: AC
  Administered 2022-12-31: 1.4 [IU]/h via INTRAVENOUS
  Filled 2022-12-28: qty 100

## 2022-12-28 MED ORDER — VANCOMYCIN HCL 1500 MG/300ML IV SOLN
1500.0000 mg | INTRAVENOUS | Status: AC
  Administered 2022-12-31: 1500 mg via INTRAVENOUS
  Filled 2022-12-28: qty 300

## 2022-12-28 MED ORDER — POTASSIUM CHLORIDE 2 MEQ/ML IV SOLN
80.0000 meq | INTRAVENOUS | Status: DC
  Filled 2022-12-28: qty 40

## 2022-12-28 MED ORDER — EPINEPHRINE HCL 5 MG/250ML IV SOLN IN NS
0.0000 ug/min | INTRAVENOUS | Status: AC
  Administered 2022-12-31: 2 ug/min via INTRAVENOUS
  Filled 2022-12-28: qty 250

## 2022-12-28 MED ORDER — TRANEXAMIC ACID 1000 MG/10ML IV SOLN
1.5000 mg/kg/h | INTRAVENOUS | Status: AC
  Administered 2022-12-31: 1.5 mg/kg/h via INTRAVENOUS
  Filled 2022-12-28: qty 25

## 2022-12-28 MED ORDER — PHENYLEPHRINE HCL-NACL 20-0.9 MG/250ML-% IV SOLN
30.0000 ug/min | INTRAVENOUS | Status: AC
  Administered 2022-12-31: 40 ug/min via INTRAVENOUS
  Filled 2022-12-28: qty 250

## 2022-12-28 NOTE — Progress Notes (Signed)
Anesthesia Chart Review:  Case: [id number] Date/Time: 12/31/22 0715   Procedures:      CORONARY ARTERY BYPASS GRAFTING (CABG) (Chest)     CLIPPING OF ATRIAL APPENDAGE WITH PULMONARY VEIN ISOLATION     TRANSESOPHAGEAL ECHOCARDIOGRAM   Anesthesia type: General   Pre-op diagnosis: CAD   [facility]: [facility] OR ROOM 15 / [facility] OR   Surgeons: [name], MD       DISCUSSION: Patent is a 62 year old mae scheduled for the above procedure.  History includes former smoker, CAD (NSTEMI with probable thrombotic lesion to small OM 05/10/06; Cardiopulmonary arrest/PEA 12/06/22), afib, HFrEF, HTN, HLD. Heavy alcohol use by history.   [place] [facility] admission 12/06/22 - 12/19/22 for chest pain with cardiopulmonary/PEA arrest in the ED, s/p CPR/ACLS with ROSC ~ 25 minutes, given TNK for presumed PE (bedside echo showed RV dilated with moderate dysfunction, D-shaped septum) in setting of afib with RVR. Diagnosed with cardiogenic shock and acute systolic CHF with EF 35% requiring pressor support and diuresis. Extubated 12/07/22. S/p RHC/LHC 12/10/22 showing severe 3V CAD and mild-moderate pulmonary hypertension. S/p TEE with DCCV 12/14/22. Future CABG recommended with pulmonary isolation ablation/LAA clipping for atrial fib after he had time to recover from acute events. He was discharged to [facility] for rehab therapy 12/19/22 - 12/25/22. Managed on GDMT with plan to transition to [facility] and SGLT2i after CABG. On Lovenox for now with last dose scheduled for 12/30/22.   12/28/22 COVID-19 test is in process.   VS: BP 126/69   Pulse 76   Temp 36.9 C (Oral)   Resp 18   Ht 6\' 2"  (1.88 m)   Wt 126.5 kg   SpO2 98%   BMI 35.80 kg/m    PROVIDERS: Sandre Kitty, PA-C is PCP  Arvilla Meres, MD is HF cardiologist   LABS: Labs reviewed: Acceptable for surgery. (all labs ordered are listed, but only abnormal results are displayed)  Labs Reviewed  CBC - Abnormal; Notable for the following components:      Result  Value   RBC 3.72 (*)    Hemoglobin 11.7 (*)    HCT 35.6 (*)    All other components within normal limits  COMPREHENSIVE METABOLIC PANEL - Abnormal; Notable for the following components:   Sodium 134 (*)    Glucose, Bld 118 (*)    All other components within normal limits  SURGICAL PCR SCREEN  SARS CORONAVIRUS 2 BY RT PCR  PROTIME-INR  APTT  URINALYSIS, ROUTINE W REFLEX MICROSCOPIC  HEMOGLOBIN A1C  TYPE AND SCREEN    IMAGES: CXR 12/19/22: FINDINGS: Similar left pleural effusion and basilar opacity. The right lung is clear. Unchanged heart size and mediastinal contours. No pulmonary edema or pneumothorax. Stable osseous structures. IMPRESSION: Similar left pleural effusion and basilar opacity.    EKG: 12/11/22: Atrial fibrillation with rapid ventricular response at 111 bpm Non-specific intra-ventricular conduction delay Marked ST abnormality, possible lateral subendocardial injury Abnormal ECG When compared with ECG of 06-Dec-2022 05:14, PREVIOUS ECG IS PRESENT Rate faster Confirmed by Orpah Cobb ([id number]) on 12/11/2022 11:14:58 PM   CV: TEE with DCCV 12/14/22: FINDINGS:  LEFT VENTRICLE: EF = 35-40% RIGHT VENTRICLE: Mid HK LEFT ATRIUM: Severe LAE LEFT ATRIAL APPENDAGE: No clot RIGHT ATRIUM: Moderate RAE AORTIC VALVE:  Trileaflet. No AS/AI MITRAL VALVE:    Mild to moderate MR TRICUSPID VALVE: Mild TR PULMONIC VALVE: Trivial TR INTERATRIAL SEPTUM: No PFO/ASD PERICARDIUM: No effusion  DESCENDING AORTA: Mild to moderate plaque    CARDIOVERSION:  Once the TEE was complete, the patient had the defibrillator pads placed in the anterior and posterior position. Once an appropriate level of sedation was achieved, the patient received a single biphasic, synchronized 250J shock with prompt conversion to sinus rhythm. No apparent complications.  Comparison TTE 12/10/21 LVEF 40-45%, global hypokinesis; 12/06/22 LVEF 35-40%, septal flattening, moderately decreased LVF, LV  endocardial border not optimally defined to evaluate regional wall motion, RVSF mildly reduced, mildly dilated RA, trivial MR   RHC/LHC 12/10/22:   Prox RCA to Mid RCA lesion is 100% stenosed.   Prox RCA lesion is 60% stenosed.   Ost LAD lesion is 70% stenosed.   Mid LM to Dist LM lesion is 70% stenosed.   Ost Cx lesion is 100% stenosed.   Mid LAD lesion is 95% stenosed.   Prox LAD to Mid LAD lesion is 50% stenosed.   2nd Diag lesion is 40% stenosed.   Findings: Ao = 101/66 (79)  LV = 99/12 RA = 8 RV = 45/10 PA = 53/15 (29) PCW = 12 Fick cardiac output/index = 8.1/3.2 PVR = 2.1 WU Ao sat = 89% PA sat = 55%, 54% PAPi = 4.6   Assessment: 1. Severe 3v heavily calcified CAD with CTO of Ostial LCx and midRCA. Mid LAD 95% 2. Unable to assess LVEF by V-gram  3. Mild to moderate PAH with normal PCWP and output   Plan/Discussion:  Needs CABG but with recent massive PE will need to discuss timing with TCTS. Will consult. Repeat echo     US Carotid 12/14/22: Summary:  Right Carotid: Velocities in the right ICA are consistent with a 1-39%  stenosis.  Left Carotid: Velocities in the left ICA are consistent with a 1-39%  stenosis.  Vertebrals: Right vertebral artery demonstrates antegrade flow.    TTE (Limited) 12/11/22: IMPRESSIONS   1. Left ventricular ejection fraction, by estimation, is 40 to 45%. The  left ventricle has mildly decreased function. The left ventricle  demonstrates global hypokinesis.   2. Tricuspid regurgitation signal is inadequate for assessing PA  pressure.   3. The inferior vena cava is normal in size with greater than 50%  respiratory variability, suggesting right atrial pressure of 3 mmHg.   4. Consider repeat 2D echo once heart rate better controlled.    Past Medical History:  Diagnosis Date   Atrial fibrillation (HCC)    CHF (congestive heart failure) (HCC)    Coronary artery disease    HLD (hyperlipidemia)    HTN (hypertension)    MI  (myocardial infarction) [facility])     Past Surgical History:  Procedure Laterality Date   CARDIOVERSION N/A 12/14/2022   Procedure: CARDIOVERSION (CATH LAB);  Surgeon: Dolores Patty, MD;  Location: [facility] INVASIVE CV LAB;  Service: Cardiovascular;  Laterality: N/A;   RIGHT HEART CATH N/A 12/06/2022   Procedure: RIGHT HEART CATH;  Surgeon: Laurey Morale, MD;  Location: [facility] INVASIVE CV LAB;  Service: Cardiovascular;  Laterality: N/A;   RIGHT/LEFT HEART CATH AND CORONARY ANGIOGRAPHY N/A 12/10/2022   Procedure: RIGHT/LEFT HEART CATH AND CORONARY ANGIOGRAPHY;  Surgeon: Dolores Patty, MD;  Location: [facility] INVASIVE CV LAB;  Service: Cardiovascular;  Laterality: N/A;   TRANSESOPHAGEAL ECHOCARDIOGRAM (CATH LAB) N/A 12/14/2022   Procedure: TRANSESOPHAGEAL ECHOCARDIOGRAM;  Surgeon: Dolores Patty, MD;  Location: [facility] INVASIVE CV LAB;  Service: Cardiovascular;  Laterality: N/A;    MEDICATIONS:  acetaminophen (TYLENOL) 325 MG tablet   amiodarone (PACERONE) 200 MG tablet   aspirin 81 MG chewable  tablet   digoxin (LANOXIN) 0.125 MG tablet   enoxaparin (LOVENOX) 120 MG/0.8ML injection   esomeprazole (NEXIUM) 40 MG capsule   guaiFENesin (MUCINEX) 600 MG 12 hr tablet   guaiFENesin (MUCINEX) 600 MG 12 hr tablet   metoprolol succinate (TOPROL-XL) 50 MG 24 hr tablet   nitroGLYCERIN (NITROSTAT) 0.4 MG SL tablet   psyllium (HYDROCIL/METAMUCIL) 95 % PACK   rosuvastatin (CRESTOR) 20 MG tablet   spironolactone (ALDACTONE) 25 MG tablet   torsemide (DEMADEX) 20 MG tablet   traZODone (DESYREL) 100 MG tablet   No current facility-administered medications for this encounter.    Shonna Chock, PA-C Surgical Short Stay/Anesthesiology [facility] Phone [phone] [facility] Phone [phone] 12/28/2022 12:06 PM

## 2022-12-28 NOTE — Progress Notes (Addendum)
PCP - Barry Brunner, PA-C Cardiologist - Dr. Sharyn Lull - pt is looking into moving cardiac care to Dr. Gala Romney if he will take him as pt.  PPM/ICD - Denies Device Orders - n/a Rep Notified - n/a  Chest x-ray - 12/19/2022 EKG - 12/11/2022 Stress Test - 07/05/2021 ECHO - 12/14/2022 Cardiac Cath - 12/10/2022  Sleep Study - Denies CPAP - n/a  No DM  Last dose of GLP1 agonist- n/a GLP1 instructions: n/a  Blood Thinner Instructions: Pt following MD instructions regarding Lovenox injections. His last dose will be Saturday, Nov. 30th. Aspirin Instructions: Pt will continue ASA through the day before surgery. Instructed NOT to take any DOS  NPO after midnight   COVID TEST- Yes. Result pending   Anesthesia review: Yes. Recent admission 11/7 - 11/26 for Cardiac Arrest.   Patient denies shortness of breath, fever, cough and chest pain at PAT appointment. Pt denies any respiratory illness/infection in the last two months.    All instructions explained to the patient, with a verbal understanding of the material. Patient agrees to go over the instructions while at home for a better understanding. Patient also instructed to self quarantine after being tested for COVID-19. The opportunity to ask questions was provided.

## 2022-12-28 NOTE — Anesthesia Preprocedure Evaluation (Signed)
Anesthesia Evaluation  Patient identified by MRN, date of birth, ID band Patient awake    Reviewed: Allergy & Precautions, NPO status , Patient's Chart, lab work & pertinent test results, reviewed documented beta blocker date and time   History of Anesthesia Complications Negative for: history of anesthetic complications  Airway Mallampati: I  TM Distance: >3 FB Neck ROM: Full    Dental  (+) Dental Advisory Given, Chipped   Pulmonary former smoker, PE (12/06/2022 (possible)) L pleural effusion   breath sounds clear to auscultation       Cardiovascular hypertension, Pt. on medications and Pt. on home beta blockers (-) angina + CAD (severe 3v ASCAD), + Past MI and +CHF  + dysrhythmias Atrial Fibrillation  Rhythm:Regular Rate:Normal  11/7/2024Chest pain with PEA arrest/CPR, ROSC after 25 min  12/14/2022 TEE with DCCV: EF 35-40%, mildly decreased LVF. Mild RV hypokinesis, severe LAE, mild-mod MR, mild TR   Neuro/Psych negative neurological ROS  negative psych ROS   GI/Hepatic ,GERD  Medicated and Controlled,,(+)     substance abuse  alcohol use  Endo/Other    Class 3 obesityBMI 35  Renal/GU negative Renal ROS     Musculoskeletal   Abdominal   Peds  Hematology  (+) Blood dyscrasia (Hb 10.9, plt 339k), anemia   Anesthesia Other Findings   Reproductive/Obstetrics                              Anesthesia Physical Anesthesia Plan  ASA: 4  Anesthesia Plan: General   Post-op Pain Management:    Induction: Intravenous  PONV Risk Score and Plan: 2 and Treatment may vary due to age or medical condition  Airway Management Planned: Oral ETT  Additional Equipment: Arterial line, PA Cath, TEE and Ultrasound Guidance Line Placement  Intra-op Plan:   Post-operative Plan: Post-operative intubation/ventilation  Informed Consent: I have reviewed the patients History and Physical, chart, labs  and discussed the procedure including the risks, benefits and alternatives for the proposed anesthesia with the patient or authorized representative who has indicated his/her understanding and acceptance.     Dental advisory given  Plan Discussed with: CRNA and Surgeon  Anesthesia Plan Comments: (PAT note written 12/28/2022 by Shonna Chock, PA-C.  )        Anesthesia Quick Evaluation

## 2022-12-30 NOTE — H&P (Signed)
[address]       [place] [address]             [phone]      Cardiothoracic Surgery Admission History and Physical   [name] Medical Record #[id number] Date of Birth: 05-05-1960   Referring: Bensimhon Primary Care: Sandre Kitty, PA-C Primary Cardiologist:None   Chief Complaint:       Chief Complaint  Patient presents with multivessel coronary artery disease.               History of Present Illness:       Jeremiah Dixon is a 62 yo male with PMH of CAD, morbid obesity, HTN, HLD,  Alcohol abuse, and Prediabetes.  He was brought to [facility] ED via EMS on 11/7 after complaints of chest pain and shortness of breath.  Upon arrival to the ED the patient's sats were 60% and he was on NRB.  He developed PEA arrest and CPR was initiated.  He required emergent intubation and was treated with TNK for concern of possible PE.  CXR obtained showed pulmonary edema.  EKG revealed A. Fib with rates in the 130s.  He was awake and following commands and admitted to the ICU.  He was placed on epinephrine, levo, and vaso for cardiogenic shock.  AHF team was consulted for shock and possible ECMO cannulation.  Post operative echocardiogram showed reduced EF of 30-35% with moderately dilated RV.  They recommended initiation of Milrinone.  They felt with recent administration of lytics it would be ideal to avoid ECMO and try and stabilize with pressors, however if need arose this was an option.  He developed hypotension post Milrinone administration this was discontinued and he was switched to Dobutamine.  He underwent right heart cath on 11/7. His respiratory condition improved and he was able to be extubated on 11/8.  He has been weaned off all pressors.  He developed repeat episodes of Atrial Fibrillation requiring Amiodarone drip.  They have been adding GDMT as tolerated.  He has been treated with ABX for possible pneumonia.  Doppler studies have ruled  out LE DVT.  Since he stabilized patient was taken for repeat RHC and LHC this revealed 3V CAD.  It was felt he would likely benefit from possible coronary bypass grafting.  Currently the patient feels like he is making improvements in small amounts considering what he has been through.  He denies chest pain and shortness of breath.  He feels like he has "fluid" in his lungs for which he is taking mucinex and breathing device, but states it comes out of nowhere.  The patient states he is fairly active.  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  Previously he was working at [employer] at [employer] which was a strenuous job for him.  He denies current smoking, but was a former of 1ppd quiting 10-15 years ago.  He is a heavy drink of daily use, states he drinks 2 40s per day of 8% ABV  prior to admission.  He denies family history of CAD.   Current Activity/ Functional Status: Patient was independent with mobility/ambulation, transfers, ADL's, IADL's.   Zubrod Score: At the time of surgery this patient's most appropriate activity status/level should be described as: []     0    Normal activity, no symptoms [x]     1    Restricted in physical strenuous activity  but ambulatory, able to do out light work []     2    Ambulatory and capable of self care, unable to do work activities, up and about                 more than 50%  Of the time                            []     3    Only limited self care, in bed greater than 50% of waking hours []     4    Completely disabled, no self care, confined to bed or chair []     5    Moribund       Past Medical History:  Diagnosis Date   HLD (hyperlipidemia)     HTN (hypertension)     MI (myocardial infarction) [facility])                 Past Surgical History:  Procedure Laterality Date   RIGHT HEART CATH N/A 12/06/2022    Procedure: RIGHT HEART CATH;  Surgeon: Laurey Morale, MD;  Location: [facility] INVASIVE CV LAB;  Service: Cardiovascular;   Laterality: N/A;   RIGHT/LEFT HEART CATH AND CORONARY ANGIOGRAPHY N/A 12/10/2022    Procedure: RIGHT/LEFT HEART CATH AND CORONARY ANGIOGRAPHY;  Surgeon: Dolores Patty, MD;  Location: [facility] INVASIVE CV LAB;  Service: Cardiovascular;  Laterality: N/A;          Tobacco Use History  Social History        Tobacco Use  Smoking Status Former   Types: Cigarettes  Smokeless Tobacco Never      Social History        Substance and Sexual Activity  Alcohol Use Yes   Alcohol/week: 8.0 standard drinks of alcohol   Types: 8 Cans of beer per week        Allergies       Allergies  Allergen Reactions   Bee Venom Palpitations   Diclofenac Diarrhea   Amoxicillin Diarrhea and Nausea Only   Lisinopril Cough   Pantoprazole Nausea Only      Other Reaction(s): chest pain                 Current Facility-Administered Medications  Medication Dose Route Frequency Provider Last Rate Last Admin   0.9 %  sodium chloride infusion   Intravenous Continuous Robbie Lis M, PA-C 20 mL/hr at 12/11/22 0900 Infusion Verify at 12/11/22 0900   0.9 %  sodium chloride infusion  250 mL Intravenous PRN Bensimhon, Bevelyn Buckles, MD   Stopped at 12/10/22 2243   acetaminophen (TYLENOL) tablet 650 mg  650 mg Oral Q4H PRN Bensimhon, Bevelyn Buckles, MD       amiodarone (NEXTERONE PREMIX) 360-4.14 MG/200ML-% (1.8 mg/mL) IV infusion  60 mg/hr Intravenous Continuous Bensimhon, Bevelyn Buckles, MD 33.3 mL/hr at 12/11/22 0900 60 mg/hr at 12/11/22 0900   aspirin chewable tablet 81 mg  81 mg Oral Daily Bensimhon, Bevelyn Buckles, MD   81 mg at 12/09/22 5409   Chlorhexidine Gluconate Cloth 2 % PADS 6 each  6 each Topical Daily Bensimhon, Bevelyn Buckles, MD   6 each at 12/10/22 0603   chlorpheniramine-HYDROcodone (TUSSIONEX) 10-8 MG/5ML suspension 5 mL  5 mL Oral Q12H Robbie Lis M, PA-C   5 mL at 12/11/22 0736   digoxin (LANOXIN) tablet 0.125 mg  0.125 mg Oral Daily Bensimhon, Bevelyn Buckles, MD   0.125  mg at 12/10/22 0942   famotidine (PEPCID)  tablet 20 mg  20 mg Oral BID Bensimhon, Bevelyn Buckles, MD   20 mg at 12/10/22 2106   folic acid (FOLVITE) tablet 1 mg  1 mg Oral Daily Bensimhon, Bevelyn Buckles, MD   1 mg at 12/10/22 0945   guaiFENesin (MUCINEX) 12 hr tablet 1,200 mg  1,200 mg Oral BID Robbie Lis M, PA-C       heparin ADULT infusion 100 units/mL (25000 units/214mL)  2,300 Units/hr Intravenous Continuous Arabella Merles, RPH 23 mL/hr at 12/11/22 0900 2,300 Units/hr at 12/11/22 0900   insulin aspart (novoLOG) injection 0-15 Units  0-15 Units Subcutaneous Q4H Bensimhon, Bevelyn Buckles, MD   2 Units at 12/10/22 2344   lidocaine (LIDODERM) 5 % 1 patch  1 patch Transdermal Q24H Bensimhon, Bevelyn Buckles, MD   1 patch at 12/09/22 1715   losartan (COZAAR) tablet 25 mg  25 mg Oral Daily Bensimhon, Bevelyn Buckles, MD   25 mg at 12/10/22 1610   metoprolol succinate (TOPROL-XL) 24 hr tablet 25 mg  25 mg Oral BID Bensimhon, Bevelyn Buckles, MD       multivitamin with minerals tablet 1 tablet  1 tablet Oral Daily Bensimhon, Bevelyn Buckles, MD   1 tablet at 12/10/22 0945   ondansetron (ZOFRAN) injection 4 mg  4 mg Intravenous Q6H PRN Bensimhon, Bevelyn Buckles, MD       Oral care mouth rinse  15 mL Mouth Rinse PRN Bensimhon, Bevelyn Buckles, MD       oxyCODONE (Oxy IR/ROXICODONE) immediate release tablet 5 mg  5 mg Oral Q6H PRN Bensimhon, Bevelyn Buckles, MD   5 mg at 12/10/22 2222   polyethylene glycol (MIRALAX / GLYCOLAX) packet 17 g  17 g Oral Daily Bensimhon, Bevelyn Buckles, MD   17 g at 12/09/22 0933   rosuvastatin (CRESTOR) tablet 20 mg  20 mg Oral Daily Bensimhon, Bevelyn Buckles, MD   20 mg at 12/10/22 9604   sodium chloride flush (NS) 0.9 % injection 3 mL  3 mL Intravenous Q12H Bensimhon, Bevelyn Buckles, MD   3 mL at 12/10/22 2116   sodium chloride flush (NS) 0.9 % injection 3 mL  3 mL Intravenous PRN Bensimhon, Bevelyn Buckles, MD       spironolactone (ALDACTONE) tablet 25 mg  25 mg Oral Daily Bensimhon, Bevelyn Buckles, MD   25 mg at 12/10/22 1000   thiamine (VITAMIN B1) tablet 100 mg  100 mg Oral Daily Bensimhon, Bevelyn Buckles,  MD   100 mg at 12/10/22 0945    Or   thiamine (VITAMIN B1) injection 100 mg  100 mg Intravenous Daily Bensimhon, Bevelyn Buckles, MD   100 mg at 12/09/22 0916   torsemide (DEMADEX) tablet 40 mg  40 mg Oral Daily Robbie Lis M, PA-C       traZODone (DESYREL) tablet 100 mg  100 mg Oral QHS PRN Bensimhon, Bevelyn Buckles, MD   100 mg at 12/09/22 2213                 Medications Prior to Admission  Medication Sig Dispense Refill Last Dose   amLODipine (NORVASC) 10 MG tablet Take 10 mg by mouth daily.     12/05/2022   esomeprazole (NEXIUM) 40 MG capsule Take 1 capsule (40 mg total) by mouth 2 (two) times daily before a meal. 60 capsule 5 12/05/2022   losartan (COZAAR) 100 MG tablet Take 50 mg by mouth daily.     12/05/2022   lovastatin (MEVACOR)  40 MG tablet Take 40 mg by mouth daily.     12/05/2022   metoprolol tartrate (LOPRESSOR) 50 MG tablet Take 50 mg by mouth 2 (two) times daily.     12/05/2022   nitroGLYCERIN (NITROSTAT) 0.4 MG SL tablet Place 0.4 mg under the tongue every 5 (five) minutes as needed. (Patient not taking: Reported on 07/20/2022)                No family history on file.         Review of Systems:                Cardiac Review of Systems: Y or  [    ]= no             Chest Pain [ Y   ]        Resting SOB [ Y  ]      Exertional SOB  [  ]     Orthopnea [  ]             Pedal Edema [   ]        Palpitations [ Y ]         Syncope  [  ]    Presyncope [   ]             General Review of Systems: [Y] = yes [  ]=no Constitional: recent weight change [  ]; anorexia [  ]; fatigue [  ]; nausea [ Y ]; night sweats [  ]; fever [  ]; or chills [  ]                                                               Dental: Last Dentist visit: tooth pulled about 6-8 months ago             Eye : blurred vision [  ]; diplopia [   ]; vision changes [  ];  Amaurosis fugax[  ]; Resp: cough [ Y ];  wheezing[  ];  hemoptysis[N  ]; shortness of breath[ Y ]; paroxysmal nocturnal dyspnea[  ]; dyspnea on  exertion[ Y ]; or orthopnea[  ];  GI:  gallstones[  ], vomiting[ Y ];  dysphagia[  ]; melena[  ];  hematochezia [  ]; heartburn[  ];   Hx of  Colonoscopy[  ]; GU: kidney stones [  ]; hematuria[  ];   dysuria [  ];  nocturia[  ];  history of     obstruction [  ]; urinary frequency [  ]             Skin: rash, swelling[ Y ];, hair loss[  ];  peripheral edema[Y  ];  or itching[  ]; bilateral varicose veins Musculosketetal: myalgias[  ];  joint swelling[  ];  joint erythema[  ];  joint pain[  ];  back pain[  ];             Heme/Lymph: bruising[  ];  bleeding[  ];  anemia[  ];  Neuro: TIA[  ];  headaches[  ];  stroke[ N ];  vertigo[  ];  seizures[  ];   paresthesias[  ];  difficulty walking[  ];  Psych:depression[  ]; anxiety[  ];             Endocrine: diabetes[ N ];  thyroid dysfunction[ N ];   Physical Exam: BP 119/78   Pulse (!) 110   Temp 98.5 F (36.9 C)   Resp 18   Ht 6\' 2"  (1.88 m)   Wt 131.3 kg   SpO2 94%   BMI 37.16 kg/m    General appearance: alert, cooperative, and no distress Head: Normocephalic, without obvious abnormality, atraumatic Neck: no adenopathy, no carotid bruit, no JVD, supple, symmetrical, trachea midline, and thyroid not enlarged, symmetric, no tenderness/mass/nodules Resp: diminished breath sounds bibasilar Cardio: irregularly irregular rhythm GI: soft, non-tender; bowel sounds normal; no masses,  no organomegaly Extremities: per patient varicose veins bilaterally, unable to assess with wraps in place Neurologic: Grossly normal   Diagnostic Studies & Laboratory data:     Recent Radiology Findings:    Imaging Results (Last 48 hours)  CARDIAC CATHETERIZATION   Result Date: 12/10/2022   Prox RCA to Mid RCA lesion is 100% stenosed.   Prox RCA lesion is 60% stenosed.   Ost LAD lesion is 70% stenosed.   Mid LM to Dist LM lesion is 70% stenosed.   Ost Cx lesion is 100% stenosed.   Mid LAD lesion is 95% stenosed.   Prox LAD to Mid LAD lesion is 50%  stenosed.   2nd Diag lesion is 40% stenosed. Findings: Ao = 101/66 (79) LV = 99/12 RA = 8 RV = 45/10 PA = 53/15 (29) PCW = 12 Fick cardiac output/index = 8.1/3.2 PVR = 2.1 WU Ao sat = 89% PA sat = 55%, 54% PAPi = 4.6 Assessment: 1. Severe 3v heavily calcified CAD with CTO of Ostial LCx and midRCA. Mid LAD 95% 2. Unable to assess LVEF by V-gram 3. Mild to moderate PAH with normal PCWP and output Plan/Discussion: Needs CABG but with recent massive PE will need to discuss timing with TCTS. Will consult. Repeat echo Arvilla Meres, MD 5:54 PM   DG Chest Port 1 View   Result Date: 12/10/2022 CLINICAL DATA:  Pneumonia EXAM: PORTABLE CHEST 1 VIEW COMPARISON:  X-ray 12/08/2022.  Older exams as well. FINDINGS: Interval removal of the Swan-Ganz catheter. No right-sided pneumothorax. Stable left IJ catheter. Enlarged cardiopericardial silhouette. Bilateral pleural effusions and opacities, left-greater-than-right. No edema. Overlapping cardiac leads. Degenerative changes along the spine. IMPRESSION: Interval removal of the right IJ line.  No pneumothorax. Bilateral pleural effusions and opacities, left-greater-than-right. Recommend continued follow up Electronically Signed   By: Karen Kays M.D.   On: 12/10/2022 17:04       I have independently reviewed the above radiologic studies and discussed with the patient    Recent Lab Findings: Recent Labs       Lab Results  Component Value Date    WBC 7.9 12/11/2022    HGB 8.9 (L) 12/11/2022    HCT 26.1 (L) 12/11/2022    PLT 201 12/11/2022    GLUCOSE 118 (H) 12/11/2022    CHOL 197 06/07/2009    TRIG 73 12/07/2022    HDL 63 06/07/2009    LDLCALC 115 (H) 06/07/2009    ALT 47 (H) 12/09/2022    AST 50 (H) 12/09/2022    NA 128 (L) 12/11/2022    K 4.1 12/11/2022    CL 91 (L) 12/11/2022    CREATININE 0.90 12/11/2022    BUN 9 12/11/2022    CO2 29 12/11/2022    TSH 6.918 (H) 12/06/2022  INR 1.5 (H) 12/06/2022    HGBA1C 5.6 12/06/2022        Assessment  / Plan:     This 62 year old gentleman has multiple risk factors for heart disease including morbid obesity, hypertension, hyperlipidemia, and prediabetes.  He was evaluated by Dr. Sharyn Lull for chest discomfort in the spring 2023 with a nuclear stress test which showed no reversible ischemia with potential small scar in the apical segment of the inferolateral wall.  Ejection fraction was 57% at that time.  No further workup was performed.  He now presents with a few week history of progressive shortness of breath and lower extremity edema which reached a crescendo on 12/06/2022 prompting call to EMS when he felt like he was going to stop breathing.  He reported some episodes of substernal chest discomfort over the past few months that he thought was heartburn and were sometimes relieved with antacids and sometimes not.  He has also had progressive exertional fatigue.  When he was seen by EMS he was found to be profoundly hypoxemic.  On presentation to the emergency room he was intubated for acute hypoxemic respiratory failure, suffered PEA arrest requiring CPR.  He was treated for cardiogenic shock with multiple vasopressors.  It was felt that he may have suffered a massive PE and he was treated with thrombolytic therapy in the emergency department.  He never had a CT scan.  Chest x-ray showed pulmonary edema.  Troponin I was 2059 and 2815.  BNP was 403.  He improved with medical therapy and was subsequently extubated and weaned off inotropes.  2D echocardiogram on presentation showed an ejection fraction of 35 to 40% with a dilated LV to 5.8 cm and moderate RV systolic dysfunction with mild RV enlargement.  There was trivial MR and TR.  RHC on presentation showed PA pressure of 49/18 with a mean of 32.  Mean wedge pressure was 10.  Mean right atrial pressure was 8.  Cardiac index was 2.5 with a PVR of 3.5WU.  PAPI was 3.87.  He subsequently underwent cardiac catheterization yesterday which showed 70% mid to distal  left main stenosis.  There was 70% ostial LAD stenosis and 95% mid LAD stenosis of a large distal vessel.  There was a moderate-sized diagonal vessel with 40% stenosis.  The left circumflex was occluded at its ostium with filling of a moderate to large marginal branch by collaterals from the LAD.  The RCA was occluded in the proximal to midportion with no significant distal vessel seen.  There was a moderate-sized acute marginal branch.  PA pressure was 53/15 with a mean of 29.  Wedge pressure was 12 and right atrial pressure was 8.  PA saturation was measured at 55%.   I think the primary problem prompting his decline admission was worsening congestive heart failure with pulmonary edema, hypoxemia and marked lower extremity edema.  He has been having intermittent chest discomfort at home which sounds like it could be angina.  I think his best long-term prognosis is going to be with coronary bypass graft surgery to the LAD, diagonal, and OM.  I am not sure that the distal right coronary circulation is graftable.  I discussed the operative procedure with the patient including alternatives, benefits and risks; including but not limited to bleeding, blood transfusion, infection, stroke, myocardial infarction, graft failure, heart block requiring a permanent pacemaker, organ dysfunction, and death.  Shirlean Mylar understands and agrees to proceed.      [name], MD

## 2022-12-31 ENCOUNTER — Encounter (HOSPITAL_COMMUNITY)
Admission: RE | Disposition: A | Discharge status: Other Acute Inpatient Hospital | Payer: Self-pay | Source: Home / Self Care | Attending: Surgery

## 2022-12-31 ENCOUNTER — Other Ambulatory Visit: Payer: Self-pay

## 2022-12-31 ENCOUNTER — Encounter (HOSPITAL_COMMUNITY): Payer: Self-pay | Admitting: Surgery

## 2022-12-31 ENCOUNTER — Inpatient Hospital Stay (HOSPITAL_COMMUNITY): Payer: Managed Care, Other (non HMO) | Admitting: Certified Registered Nurse Anesthetist

## 2022-12-31 ENCOUNTER — Inpatient Hospital Stay (HOSPITAL_COMMUNITY): Payer: Self-pay | Admitting: Vascular Surgery

## 2022-12-31 ENCOUNTER — Inpatient Hospital Stay (HOSPITAL_COMMUNITY): Payer: Managed Care, Other (non HMO)

## 2022-12-31 ENCOUNTER — Inpatient Hospital Stay (HOSPITAL_COMMUNITY)
Admission: RE | Admit: 2022-12-31 | Discharge: 2023-01-04 | DRG: 234 | Disposition: A | Discharge status: Rehabilitation Facility | Payer: Managed Care, Other (non HMO) | Attending: Surgery | Admitting: Surgery

## 2022-12-31 DIAGNOSIS — J9611 Chronic respiratory failure with hypoxia: Secondary | ICD-10-CM | POA: Diagnosis present

## 2022-12-31 DIAGNOSIS — Z86711 Personal history of pulmonary embolism: Secondary | ICD-10-CM | POA: Diagnosis not present

## 2022-12-31 DIAGNOSIS — E785 Hyperlipidemia, unspecified: Secondary | ICD-10-CM | POA: Diagnosis present

## 2022-12-31 DIAGNOSIS — R7303 Prediabetes: Secondary | ICD-10-CM | POA: Diagnosis present

## 2022-12-31 DIAGNOSIS — Z87891 Personal history of nicotine dependence: Secondary | ICD-10-CM

## 2022-12-31 DIAGNOSIS — I110 Hypertensive heart disease with heart failure: Secondary | ICD-10-CM | POA: Diagnosis present

## 2022-12-31 DIAGNOSIS — I5022 Chronic systolic (congestive) heart failure: Secondary | ICD-10-CM | POA: Diagnosis present

## 2022-12-31 DIAGNOSIS — Z951 Presence of aortocoronary bypass graft: Principal | ICD-10-CM

## 2022-12-31 DIAGNOSIS — Z888 Allergy status to other drugs, medicaments and biological substances status: Secondary | ICD-10-CM | POA: Diagnosis not present

## 2022-12-31 DIAGNOSIS — S2242XD Multiple fractures of ribs, left side, subsequent encounter for fracture with routine healing: Secondary | ICD-10-CM | POA: Diagnosis not present

## 2022-12-31 DIAGNOSIS — E871 Hypo-osmolality and hyponatremia: Secondary | ICD-10-CM | POA: Diagnosis present

## 2022-12-31 DIAGNOSIS — Z8673 Personal history of transient ischemic attack (TIA), and cerebral infarction without residual deficits: Secondary | ICD-10-CM

## 2022-12-31 DIAGNOSIS — I272 Pulmonary hypertension, unspecified: Secondary | ICD-10-CM | POA: Diagnosis present

## 2022-12-31 DIAGNOSIS — Z79899 Other long term (current) drug therapy: Secondary | ICD-10-CM

## 2022-12-31 DIAGNOSIS — Z91030 Bee allergy status: Secondary | ICD-10-CM | POA: Diagnosis not present

## 2022-12-31 DIAGNOSIS — I252 Old myocardial infarction: Secondary | ICD-10-CM

## 2022-12-31 DIAGNOSIS — R112 Nausea with vomiting, unspecified: Secondary | ICD-10-CM | POA: Diagnosis not present

## 2022-12-31 DIAGNOSIS — I2510 Atherosclerotic heart disease of native coronary artery without angina pectoris: Secondary | ICD-10-CM

## 2022-12-31 DIAGNOSIS — F1021 Alcohol dependence, in remission: Secondary | ICD-10-CM | POA: Diagnosis present

## 2022-12-31 DIAGNOSIS — I480 Paroxysmal atrial fibrillation: Secondary | ICD-10-CM | POA: Diagnosis present

## 2022-12-31 DIAGNOSIS — K219 Gastro-esophageal reflux disease without esophagitis: Secondary | ICD-10-CM | POA: Diagnosis present

## 2022-12-31 DIAGNOSIS — D6959 Other secondary thrombocytopenia: Secondary | ICD-10-CM | POA: Diagnosis not present

## 2022-12-31 DIAGNOSIS — R5381 Other malaise: Secondary | ICD-10-CM | POA: Diagnosis present

## 2022-12-31 DIAGNOSIS — I4891 Unspecified atrial fibrillation: Secondary | ICD-10-CM

## 2022-12-31 DIAGNOSIS — Z8674 Personal history of sudden cardiac arrest: Secondary | ICD-10-CM

## 2022-12-31 DIAGNOSIS — Z880 Allergy status to penicillin: Secondary | ICD-10-CM

## 2022-12-31 HISTORY — PX: CORONARY ARTERY BYPASS GRAFT: SHX141

## 2022-12-31 HISTORY — PX: CLIPPING OF ATRIAL APPENDAGE: SHX5773

## 2022-12-31 HISTORY — PX: TEE WITHOUT CARDIOVERSION: SHX5443

## 2022-12-31 LAB — POCT I-STAT 7, (LYTES, BLD GAS, ICA,H+H)
Acid-Base Excess: 0 mmol/L (ref 0.0–2.0)
Acid-Base Excess: 1 mmol/L (ref 0.0–2.0)
Acid-Base Excess: 0 mmol/L (ref 0.0–2.0)
Acid-base deficit: 5 mmol/L — ABNORMAL HIGH (ref 0.0–2.0)
Acid-base deficit: 2 mmol/L (ref 0.0–2.0)
Acid-base deficit: 3 mmol/L — ABNORMAL HIGH (ref 0.0–2.0)
Acid-base deficit: 3 mmol/L — ABNORMAL HIGH (ref 0.0–2.0)
Bicarbonate: 20.8 mmol/L (ref 20.0–28.0)
Bicarbonate: 24.7 mmol/L (ref 20.0–28.0)
Bicarbonate: 25.1 mmol/L (ref 20.0–28.0)
Bicarbonate: 24.6 mmol/L (ref 20.0–28.0)
Bicarbonate: 23.6 mmol/L (ref 20.0–28.0)
Bicarbonate: 22.4 mmol/L (ref 20.0–28.0)
Bicarbonate: 23.3 mmol/L (ref 20.0–28.0)
Calcium, Ion: 1.16 mmol/L (ref 1.15–1.40)
Calcium, Ion: 1.07 mmol/L — ABNORMAL LOW (ref 1.15–1.40)
Calcium, Ion: 1.07 mmol/L — ABNORMAL LOW (ref 1.15–1.40)
Calcium, Ion: 1.12 mmol/L — ABNORMAL LOW (ref 1.15–1.40)
Calcium, Ion: 1.18 mmol/L (ref 1.15–1.40)
Calcium, Ion: 1.11 mmol/L — ABNORMAL LOW (ref 1.15–1.40)
Calcium, Ion: 1.19 mmol/L (ref 1.15–1.40)
HCT: 32 % — ABNORMAL LOW (ref 39.0–52.0)
HCT: 25 % — ABNORMAL LOW (ref 39.0–52.0)
HCT: 24 % — ABNORMAL LOW (ref 39.0–52.0)
HCT: 29 % — ABNORMAL LOW (ref 39.0–52.0)
HCT: 28 % — ABNORMAL LOW (ref 39.0–52.0)
HCT: 26 % — ABNORMAL LOW (ref 39.0–52.0)
HCT: 23 % — ABNORMAL LOW (ref 39.0–52.0)
Hemoglobin: 10.9 g/dL — ABNORMAL LOW (ref 13.0–17.0)
Hemoglobin: 8.5 g/dL — ABNORMAL LOW (ref 13.0–17.0)
Hemoglobin: 8.2 g/dL — ABNORMAL LOW (ref 13.0–17.0)
Hemoglobin: 9.9 g/dL — ABNORMAL LOW (ref 13.0–17.0)
Hemoglobin: 9.5 g/dL — ABNORMAL LOW (ref 13.0–17.0)
Hemoglobin: 8.8 g/dL — ABNORMAL LOW (ref 13.0–17.0)
Hemoglobin: 7.8 g/dL — ABNORMAL LOW (ref 13.0–17.0)
O2 Saturation: 100 %
O2 Saturation: 100 %
O2 Saturation: 100 %
O2 Saturation: 100 %
O2 Saturation: 100 %
O2 Saturation: 99 %
O2 Saturation: 99 %
Patient temperature: 35.7
Patient temperature: 36.6
Patient temperature: 37
Potassium: 2.9 mmol/L — ABNORMAL LOW (ref 3.5–5.1)
Potassium: 5.2 mmol/L — ABNORMAL HIGH (ref 3.5–5.1)
Potassium: 4.2 mmol/L (ref 3.5–5.1)
Potassium: 3.8 mmol/L (ref 3.5–5.1)
Potassium: 3.5 mmol/L (ref 3.5–5.1)
Potassium: 3.7 mmol/L (ref 3.5–5.1)
Potassium: 3.6 mmol/L (ref 3.5–5.1)
Sodium: 140 mmol/L (ref 135–145)
Sodium: 136 mmol/L (ref 135–145)
Sodium: 138 mmol/L (ref 135–145)
Sodium: 141 mmol/L (ref 135–145)
Sodium: 140 mmol/L (ref 135–145)
Sodium: 142 mmol/L (ref 135–145)
Sodium: 141 mmol/L (ref 135–145)
TCO2: 22 mmol/L (ref 22–32)
TCO2: 26 mmol/L (ref 22–32)
TCO2: 26 mmol/L (ref 22–32)
TCO2: 26 mmol/L (ref 22–32)
TCO2: 25 mmol/L (ref 22–32)
TCO2: 24 mmol/L (ref 22–32)
TCO2: 25 mmol/L (ref 22–32)
pCO2 arterial: 39 mm[Hg] (ref 32–48)
pCO2 arterial: 39 mm[Hg] (ref 32–48)
pCO2 arterial: 38.8 mmHg (ref 32–48)
pCO2 arterial: 40.4 mm[Hg] (ref 32–48)
pCO2 arterial: 42.5 mm[Hg] (ref 32–48)
pCO2 arterial: 41.4 mm[Hg] (ref 32–48)
pCO2 arterial: 45.8 mm[Hg] (ref 32–48)
pH, Arterial: 7.334 — ABNORMAL LOW (ref 7.35–7.45)
pH, Arterial: 7.41 (ref 7.35–7.45)
pH, Arterial: 7.419 (ref 7.35–7.45)
pH, Arterial: 7.393 (ref 7.35–7.45)
pH, Arterial: 7.345 — ABNORMAL LOW (ref 7.35–7.45)
pH, Arterial: 7.339 — ABNORMAL LOW (ref 7.35–7.45)
pH, Arterial: 7.314 — ABNORMAL LOW (ref 7.35–7.45)
pO2, Arterial: 425 mm[Hg] — ABNORMAL HIGH (ref 83–108)
pO2, Arterial: 359 mm[Hg] — ABNORMAL HIGH (ref 83–108)
pO2, Arterial: 300 mmHg — ABNORMAL HIGH (ref 83–108)
pO2, Arterial: 342 mm[Hg] — ABNORMAL HIGH (ref 83–108)
pO2, Arterial: 174 mm[Hg] — ABNORMAL HIGH (ref 83–108)
pO2, Arterial: 154 mm[Hg] — ABNORMAL HIGH (ref 83–108)
pO2, Arterial: 134 mm[Hg] — ABNORMAL HIGH (ref 83–108)

## 2022-12-31 LAB — HEMOGLOBIN AND HEMATOCRIT, BLOOD
HCT: 22.8 % — ABNORMAL LOW (ref 39.0–52.0)
Hemoglobin: 7.4 g/dL — ABNORMAL LOW (ref 13.0–17.0)

## 2022-12-31 LAB — POCT I-STAT, CHEM 8
BUN: 5 mg/dL — ABNORMAL LOW (ref 8–23)
BUN: 5 mg/dL — ABNORMAL LOW (ref 8–23)
BUN: 5 mg/dL — ABNORMAL LOW (ref 8–23)
BUN: 5 mg/dL — ABNORMAL LOW (ref 8–23)
BUN: 5 mg/dL — ABNORMAL LOW (ref 8–23)
Calcium, Ion: 1.22 mmol/L (ref 1.15–1.40)
Calcium, Ion: 1.2 mmol/L (ref 1.15–1.40)
Calcium, Ion: 1.07 mmol/L — ABNORMAL LOW (ref 1.15–1.40)
Calcium, Ion: 1.1 mmol/L — ABNORMAL LOW (ref 1.15–1.40)
Calcium, Ion: 1.14 mmol/L — ABNORMAL LOW (ref 1.15–1.40)
Chloride: 102 mmol/L (ref 98–111)
Chloride: 102 mmol/L (ref 98–111)
Chloride: 100 mmol/L (ref 98–111)
Chloride: 101 mmol/L (ref 98–111)
Chloride: 102 mmol/L (ref 98–111)
Creatinine, Ser: 0.5 mg/dL — ABNORMAL LOW (ref 0.61–1.24)
Creatinine, Ser: 0.6 mg/dL — ABNORMAL LOW (ref 0.61–1.24)
Creatinine, Ser: 0.5 mg/dL — ABNORMAL LOW (ref 0.61–1.24)
Creatinine, Ser: 0.6 mg/dL — ABNORMAL LOW (ref 0.61–1.24)
Creatinine, Ser: 0.5 mg/dL — ABNORMAL LOW (ref 0.61–1.24)
Glucose, Bld: 96 mg/dL (ref 70–99)
Glucose, Bld: 125 mg/dL — ABNORMAL HIGH (ref 70–99)
Glucose, Bld: 119 mg/dL — ABNORMAL HIGH (ref 70–99)
Glucose, Bld: 122 mg/dL — ABNORMAL HIGH (ref 70–99)
Glucose, Bld: 135 mg/dL — ABNORMAL HIGH (ref 70–99)
HCT: 30 % — ABNORMAL LOW (ref 39.0–52.0)
HCT: 32 % — ABNORMAL LOW (ref 39.0–52.0)
HCT: 25 % — ABNORMAL LOW (ref 39.0–52.0)
HCT: 26 % — ABNORMAL LOW (ref 39.0–52.0)
HCT: 26 % — ABNORMAL LOW (ref 39.0–52.0)
Hemoglobin: 10.2 g/dL — ABNORMAL LOW (ref 13.0–17.0)
Hemoglobin: 10.9 g/dL — ABNORMAL LOW (ref 13.0–17.0)
Hemoglobin: 8.5 g/dL — ABNORMAL LOW (ref 13.0–17.0)
Hemoglobin: 8.8 g/dL — ABNORMAL LOW (ref 13.0–17.0)
Hemoglobin: 8.8 g/dL — ABNORMAL LOW (ref 13.0–17.0)
Potassium: 3 mmol/L — ABNORMAL LOW (ref 3.5–5.1)
Potassium: 3.6 mmol/L (ref 3.5–5.1)
Potassium: 4.1 mmol/L (ref 3.5–5.1)
Potassium: 4.5 mmol/L (ref 3.5–5.1)
Potassium: 3.8 mmol/L (ref 3.5–5.1)
Sodium: 140 mmol/L (ref 135–145)
Sodium: 138 mmol/L (ref 135–145)
Sodium: 137 mmol/L (ref 135–145)
Sodium: 138 mmol/L (ref 135–145)
Sodium: 140 mmol/L (ref 135–145)
TCO2: 27 mmol/L (ref 22–32)
TCO2: 23 mmol/L (ref 22–32)
TCO2: 27 mmol/L (ref 22–32)
TCO2: 27 mmol/L (ref 22–32)
TCO2: 27 mmol/L (ref 22–32)

## 2022-12-31 LAB — CBC
HCT: 27 % — ABNORMAL LOW (ref 39.0–52.0)
HCT: 27.4 % — ABNORMAL LOW (ref 39.0–52.0)
Hemoglobin: 9 g/dL — ABNORMAL LOW (ref 13.0–17.0)
Hemoglobin: 9 g/dL — ABNORMAL LOW (ref 13.0–17.0)
MCH: 31.8 pg (ref 26.0–34.0)
MCH: 31.9 pg (ref 26.0–34.0)
MCHC: 33.3 g/dL (ref 30.0–36.0)
MCHC: 32.8 g/dL (ref 30.0–36.0)
MCV: 95.4 fL (ref 80.0–100.0)
MCV: 97.2 fL (ref 80.0–100.0)
Platelets: 173 10*3/uL (ref 150–400)
Platelets: 209 10*3/uL (ref 150–400)
RBC: 2.83 MIL/uL — ABNORMAL LOW (ref 4.22–5.81)
RBC: 2.82 MIL/uL — ABNORMAL LOW (ref 4.22–5.81)
RDW: 13.3 % (ref 11.5–15.5)
RDW: 13.4 % (ref 11.5–15.5)
WBC: 11.6 10*3/uL — ABNORMAL HIGH (ref 4.0–10.5)
WBC: 12.1 10*3/uL — ABNORMAL HIGH (ref 4.0–10.5)
nRBC: 0 % (ref 0.0–0.2)
nRBC: 0 % (ref 0.0–0.2)

## 2022-12-31 LAB — POCT I-STAT EG7
Acid-Base Excess: 0 mmol/L (ref 0.0–2.0)
Bicarbonate: 23.4 mmol/L (ref 20.0–28.0)
Calcium, Ion: 1.07 mmol/L — ABNORMAL LOW (ref 1.15–1.40)
HCT: 26 % — ABNORMAL LOW (ref 39.0–52.0)
Hemoglobin: 8.8 g/dL — ABNORMAL LOW (ref 13.0–17.0)
O2 Saturation: 93 %
Potassium: 4.1 mmol/L (ref 3.5–5.1)
Sodium: 140 mmol/L (ref 135–145)
TCO2: 24 mmol/L (ref 22–32)
pCO2, Ven: 32.1 mm[Hg] — ABNORMAL LOW (ref 44–60)
pH, Ven: 7.471 — ABNORMAL HIGH (ref 7.25–7.43)
pO2, Ven: 62 mm[Hg] — ABNORMAL HIGH (ref 32–45)

## 2022-12-31 LAB — BASIC METABOLIC PANEL
Anion gap: 7 (ref 5–15)
BUN: 6 mg/dL — ABNORMAL LOW (ref 8–23)
CO2: 23 mmol/L (ref 22–32)
Calcium: 8 mg/dL — ABNORMAL LOW (ref 8.9–10.3)
Chloride: 108 mmol/L (ref 98–111)
Creatinine, Ser: 0.75 mg/dL (ref 0.61–1.24)
GFR, Estimated: >60 mL/min (ref >60)
Glucose, Bld: 202 mg/dL — ABNORMAL HIGH (ref 70–99)
Potassium: 4 mmol/L (ref 3.5–5.1)
Sodium: 138 mmol/L (ref 135–145)

## 2022-12-31 LAB — ECHO INTRAOPERATIVE TEE
AV Mean grad: 4 mm[Hg]
Height: 74 in
MV Vena cont: 0.6 cm
Weight: 4336 [oz_av]

## 2022-12-31 LAB — GLUCOSE, CAPILLARY
Glucose-Capillary: 133 mg/dL — ABNORMAL HIGH (ref 70–99)
Glucose-Capillary: 167 mg/dL — ABNORMAL HIGH (ref 70–99)
Glucose-Capillary: 150 mg/dL — ABNORMAL HIGH (ref 70–99)
Glucose-Capillary: 133 mg/dL — ABNORMAL HIGH (ref 70–99)
Glucose-Capillary: 162 mg/dL — ABNORMAL HIGH (ref 70–99)
Glucose-Capillary: 173 mg/dL — ABNORMAL HIGH (ref 70–99)
Glucose-Capillary: 186 mg/dL — ABNORMAL HIGH (ref 70–99)
Glucose-Capillary: 192 mg/dL — ABNORMAL HIGH (ref 70–99)
Glucose-Capillary: 164 mg/dL — ABNORMAL HIGH (ref 70–99)

## 2022-12-31 LAB — MAGNESIUM: Magnesium: 2.9 mg/dL — ABNORMAL HIGH (ref 1.7–2.4)

## 2022-12-31 LAB — PLATELET COUNT: Platelets: 159 10*3/uL (ref 150–400)

## 2022-12-31 LAB — PROTIME-INR
INR: 1.3 — ABNORMAL HIGH (ref 0.8–1.2)
Prothrombin Time: 16.4 s — ABNORMAL HIGH (ref 11.4–15.2)

## 2022-12-31 LAB — PREPARE RBC (CROSSMATCH)

## 2022-12-31 LAB — APTT: aPTT: 28 s (ref 24–36)

## 2022-12-31 SURGERY — CORONARY ARTERY BYPASS GRAFTING (CABG)
Anesthesia: General | Site: Chest

## 2022-12-31 MED ORDER — ASPIRIN 325 MG PO TBEC
325.0000 mg | DELAYED_RELEASE_TABLET | Freq: Every day | ORAL | Status: DC
Start: 2023-01-01 — End: 2023-01-04
  Administered 2023-01-01 – 2023-01-03 (×3): 325 mg via ORAL
  Filled 2022-12-31 (×3): qty 1

## 2022-12-31 MED ORDER — VANCOMYCIN HCL 1000 MG IV SOLR
INTRAVENOUS | Status: AC
  Filled 2022-12-31: qty 20

## 2022-12-31 MED ORDER — DEXMEDETOMIDINE HCL IN NACL 400 MCG/100ML IV SOLN
0.0000 ug/kg/h | INTRAVENOUS | Status: DC
  Administered 2023-01-01: 0.2 ug/kg/h via INTRAVENOUS
  Filled 2022-12-31: qty 100

## 2022-12-31 MED ORDER — METOCLOPRAMIDE HCL 5 MG/ML IJ SOLN
10.0000 mg | Freq: Four times a day (QID) | INTRAMUSCULAR | Status: AC
  Administered 2022-12-31 – 2023-01-01 (×6): 10 mg via INTRAVENOUS
  Filled 2022-12-31 (×6): qty 2

## 2022-12-31 MED ORDER — FENTANYL CITRATE (PF) 250 MCG/5ML IJ SOLN
INTRAMUSCULAR | Status: DC | PRN
  Administered 2022-12-31 (×2): 100 ug via INTRAVENOUS
  Administered 2022-12-31: 700 ug via INTRAVENOUS
  Administered 2022-12-31 (×2): 100 ug via INTRAVENOUS
  Administered 2022-12-31: 50 ug via INTRAVENOUS
  Administered 2022-12-31: 100 ug via INTRAVENOUS

## 2022-12-31 MED ORDER — MIDAZOLAM HCL 2 MG/2ML IJ SOLN
INTRAMUSCULAR | Status: AC
  Filled 2022-12-31: qty 2

## 2022-12-31 MED ORDER — MIDAZOLAM HCL (PF) 5 MG/ML IJ SOLN
INTRAMUSCULAR | Status: DC | PRN
  Administered 2022-12-31: 2 mg via INTRAVENOUS
  Administered 2022-12-31: 1 mg via INTRAVENOUS
  Administered 2022-12-31 (×2): 2 mg via INTRAVENOUS
  Administered 2022-12-31: 1 mg via INTRAVENOUS

## 2022-12-31 MED ORDER — METOPROLOL TARTRATE 12.5 MG HALF TABLET: 12.5000 mg | ORAL_TABLET | Freq: Two times a day (BID) | ORAL | Status: DC

## 2022-12-31 MED ORDER — FENTANYL CITRATE (PF) 250 MCG/5ML IJ SOLN
INTRAMUSCULAR | Status: AC
  Filled 2022-12-31: qty 5

## 2022-12-31 MED ORDER — SODIUM CHLORIDE 0.9% FLUSH: 3.0000 mL | INTRAVENOUS | Status: DC | PRN

## 2022-12-31 MED ORDER — ALBUMIN HUMAN 5 % IV SOLN: INTRAVENOUS | Status: DC | PRN

## 2022-12-31 MED ORDER — ONDANSETRON HCL 4 MG/2ML IJ SOLN
4.0000 mg | Freq: Four times a day (QID) | INTRAMUSCULAR | Status: DC | PRN
  Administered 2023-01-03: 4 mg via INTRAVENOUS
  Filled 2022-12-31 (×2): qty 2

## 2022-12-31 MED ORDER — MORPHINE SULFATE (PF) 2 MG/ML IV SOLN
1.0000 mg | INTRAVENOUS | Status: DC | PRN
Start: 2022-12-31 — End: 2023-01-04
  Administered 2022-12-31 – 2023-01-02 (×4): 2 mg via INTRAVENOUS
  Filled 2022-12-31 (×5): qty 1

## 2022-12-31 MED ORDER — CHLORHEXIDINE GLUCONATE 4 % EX SOLN: 30.0000 mL | CUTANEOUS | Status: DC

## 2022-12-31 MED ORDER — ORAL CARE MOUTH RINSE
15.0000 mL | OROMUCOSAL | Status: DC
  Administered 2022-12-31: 15 mL via OROMUCOSAL

## 2022-12-31 MED ORDER — ACETAMINOPHEN 500 MG PO TABS
1000.0000 mg | ORAL_TABLET | Freq: Four times a day (QID) | ORAL | Status: DC
  Administered 2022-12-31 – 2023-01-04 (×14): 1000 mg via ORAL
  Filled 2022-12-31 (×15): qty 2

## 2022-12-31 MED ORDER — PROPOFOL 10 MG/ML IV BOLUS
INTRAVENOUS | Status: DC | PRN
  Administered 2022-12-31: 30 mg via INTRAVENOUS

## 2022-12-31 MED ORDER — THROMBIN 20000 UNITS EX SOLR: CUTANEOUS | Status: DC | PRN

## 2022-12-31 MED ORDER — LACTATED RINGERS IV SOLN: INTRAVENOUS | Status: DC

## 2022-12-31 MED ORDER — DEXTROSE 50 % IV SOLN: 0.0000 mL | INTRAVENOUS | Status: DC | PRN

## 2022-12-31 MED ORDER — CHLORHEXIDINE GLUCONATE 0.12 % MT SOLN
15.0000 mL | OROMUCOSAL | Status: AC
  Administered 2022-12-31: 15 mL via OROMUCOSAL
  Filled 2022-12-31: qty 15

## 2022-12-31 MED ORDER — BISACODYL 10 MG RE SUPP: 10.0000 mg | Freq: Every day | RECTAL | Status: DC

## 2022-12-31 MED ORDER — OXYCODONE HCL 5 MG PO TABS
5.0000 mg | ORAL_TABLET | ORAL | Status: DC | PRN
Start: 2022-12-31 — End: 2023-01-04
  Administered 2022-12-31 – 2023-01-02 (×9): 10 mg via ORAL
  Filled 2022-12-31 (×9): qty 2

## 2022-12-31 MED ORDER — ORAL CARE MOUTH RINSE: 15.0000 mL | OROMUCOSAL | Status: DC | PRN

## 2022-12-31 MED ORDER — PROPOFOL 10 MG/ML IV BOLUS
INTRAVENOUS | Status: AC
  Filled 2022-12-31: qty 20

## 2022-12-31 MED ORDER — LACTATED RINGERS IV SOLN
INTRAVENOUS | Status: AC
Start: 2022-12-31 — End: 2023-01-01

## 2022-12-31 MED ORDER — VECURONIUM BROMIDE 10 MG IV SOLR
INTRAVENOUS | Status: DC | PRN
  Administered 2022-12-31: 2 mg via INTRAVENOUS
  Administered 2022-12-31: 1 mg via INTRAVENOUS
  Administered 2022-12-31: 2 mg via INTRAVENOUS

## 2022-12-31 MED ORDER — SODIUM CHLORIDE 0.9% FLUSH
10.0000 mL | Freq: Two times a day (BID) | INTRAVENOUS | Status: DC
  Administered 2022-12-31: 15 mL
  Administered 2023-01-01 – 2023-01-02 (×3): 10 mL

## 2022-12-31 MED ORDER — POTASSIUM CHLORIDE 10 MEQ/50ML IV SOLN
10.0000 meq | INTRAVENOUS | Status: AC
  Administered 2022-12-31 (×3): 10 meq via INTRAVENOUS

## 2022-12-31 MED ORDER — METOPROLOL TARTRATE 5 MG/5ML IV SOLN: 2.5000 mg | INTRAVENOUS | Status: DC | PRN

## 2022-12-31 MED ORDER — LIDOCAINE 2% (20 MG/ML) 5 ML SYRINGE
INTRAMUSCULAR | Status: DC | PRN
  Administered 2022-12-31: 20 mg via INTRAVENOUS

## 2022-12-31 MED ORDER — POTASSIUM CHLORIDE 10 MEQ/50ML IV SOLN
10.0000 meq | INTRAVENOUS | Status: AC
  Administered 2022-12-31 (×3): 10 meq via INTRAVENOUS
  Filled 2022-12-31 (×3): qty 50

## 2022-12-31 MED ORDER — TRAZODONE HCL 50 MG PO TABS
100.0000 mg | ORAL_TABLET | Freq: Every evening | ORAL | Status: DC | PRN
  Administered 2023-01-02 – 2023-01-03 (×3): 100 mg via ORAL
  Filled 2022-12-31 (×3): qty 2

## 2022-12-31 MED ORDER — CHLORHEXIDINE GLUCONATE 0.12 % MT SOLN: 15.0000 mL | Freq: Once | OROMUCOSAL | Status: DC

## 2022-12-31 MED ORDER — CHLORHEXIDINE GLUCONATE 0.12 % MT SOLN
15.0000 mL | Freq: Once | OROMUCOSAL | Status: AC
  Administered 2022-12-31: 15 mL via OROMUCOSAL
  Filled 2022-12-31: qty 15

## 2022-12-31 MED ORDER — AMIODARONE HCL 200 MG PO TABS
200.0000 mg | ORAL_TABLET | Freq: Two times a day (BID) | ORAL | Status: DC
  Administered 2023-01-01 – 2023-01-04 (×7): 200 mg via ORAL
  Filled 2022-12-31 (×7): qty 1

## 2022-12-31 MED ORDER — TRANEXAMIC ACID 1000 MG/10ML IV SOLN
1.5000 mg/kg/h | INTRAVENOUS | Status: DC
  Filled 2022-12-31: qty 25

## 2022-12-31 MED ORDER — ONDANSETRON HCL 4 MG/2ML IJ SOLN
INTRAMUSCULAR | Status: DC | PRN
  Administered 2022-12-31: 4 mg via INTRAVENOUS

## 2022-12-31 MED ORDER — EPINEPHRINE HCL 5 MG/250ML IV SOLN IN NS
0.0000 ug/min | INTRAVENOUS | Status: DC
  Administered 2023-01-01: 5 ug/min via INTRAVENOUS
  Filled 2022-12-31: qty 250

## 2022-12-31 MED ORDER — ASPIRIN 81 MG PO CHEW
324.0000 mg | CHEWABLE_TABLET | Freq: Once | ORAL | Status: AC
  Administered 2022-12-31: 324 mg via ORAL
  Filled 2022-12-31: qty 4

## 2022-12-31 MED ORDER — TRAMADOL HCL 50 MG PO TABS
50.0000 mg | ORAL_TABLET | ORAL | Status: DC | PRN
Start: 2022-12-31 — End: 2023-01-04
  Administered 2023-01-01: 50 mg via ORAL
  Administered 2023-01-01 (×2): 100 mg via ORAL
  Administered 2023-01-02: 50 mg via ORAL
  Filled 2022-12-31: qty 2
  Filled 2022-12-31: qty 1
  Filled 2022-12-31: qty 2
  Filled 2022-12-31: qty 1

## 2022-12-31 MED ORDER — SODIUM CHLORIDE 0.9% IV SOLUTION: Freq: Once | INTRAVENOUS | Status: DC

## 2022-12-31 MED ORDER — LACTATED RINGERS IV SOLN: INTRAVENOUS | Status: DC | PRN

## 2022-12-31 MED ORDER — PLASMA-LYTE A IV SOLN
INTRAVENOUS | Status: DC | PRN
  Administered 2022-12-31: 500 mL via INTRAVASCULAR

## 2022-12-31 MED ORDER — ORAL CARE MOUTH RINSE: 15.0000 mL | Freq: Once | OROMUCOSAL | Status: AC

## 2022-12-31 MED ORDER — HEMOSTATIC AGENTS (NO CHARGE) OPTIME
TOPICAL | Status: DC | PRN
  Administered 2022-12-31: 1 via TOPICAL

## 2022-12-31 MED ORDER — MIDAZOLAM HCL 2 MG/2ML IJ SOLN
2.0000 mg | INTRAMUSCULAR | Status: DC | PRN
  Administered 2022-12-31: 2 mg via INTRAVENOUS
  Filled 2022-12-31: qty 2

## 2022-12-31 MED ORDER — 0.9 % SODIUM CHLORIDE (POUR BTL) OPTIME
TOPICAL | Status: DC | PRN
  Administered 2022-12-31: 5000 mL

## 2022-12-31 MED ORDER — THROMBIN 5000 UNITS EX SOLR
OROMUCOSAL | Status: DC | PRN
  Administered 2022-12-31 (×3): 4 mL via TOPICAL

## 2022-12-31 MED ORDER — DOCUSATE SODIUM 100 MG PO CAPS
200.0000 mg | ORAL_CAPSULE | Freq: Every day | ORAL | Status: DC
  Administered 2023-01-01 – 2023-01-04 (×4): 200 mg via ORAL
  Filled 2022-12-31 (×4): qty 2

## 2022-12-31 MED ORDER — SODIUM CHLORIDE 0.9 % IV SOLN: INTRAVENOUS | Status: DC

## 2022-12-31 MED ORDER — DIGOXIN 125 MCG PO TABS
0.1250 mg | ORAL_TABLET | Freq: Every day | ORAL | Status: DC
  Administered 2023-01-01 – 2023-01-04 (×4): 0.125 mg via ORAL
  Filled 2022-12-31 (×4): qty 1

## 2022-12-31 MED ORDER — HEPARIN SODIUM (PORCINE) 1000 UNIT/ML IJ SOLN
INTRAMUSCULAR | Status: DC | PRN
  Administered 2022-12-31: 40000 [IU] via INTRAVENOUS

## 2022-12-31 MED ORDER — ACETAMINOPHEN 160 MG/5ML PO SOLN
650.0000 mg | Freq: Once | ORAL | Status: AC
  Administered 2022-12-31: 650 mg
  Filled 2022-12-31: qty 20.3

## 2022-12-31 MED ORDER — PANTOPRAZOLE SODIUM 40 MG PO TBEC
80.0000 mg | DELAYED_RELEASE_TABLET | Freq: Every day | ORAL | Status: DC
  Administered 2023-01-03 – 2023-01-04 (×2): 80 mg via ORAL
  Filled 2022-12-31 (×3): qty 2

## 2022-12-31 MED ORDER — VANCOMYCIN HCL IN DEXTROSE 1-5 GM/200ML-% IV SOLN
1000.0000 mg | Freq: Once | INTRAVENOUS | Status: AC
  Administered 2022-12-31: 1000 mg via INTRAVENOUS
  Filled 2022-12-31: qty 200

## 2022-12-31 MED ORDER — VANCOMYCIN HCL 1000 MG IV SOLR
INTRAVENOUS | Status: DC | PRN
Start: 2022-12-31 — End: 2022-12-31
  Administered 2022-12-31: 1000 mg

## 2022-12-31 MED ORDER — SODIUM CHLORIDE 0.9% FLUSH
3.0000 mL | Freq: Two times a day (BID) | INTRAVENOUS | Status: DC
  Administered 2023-01-01 – 2023-01-04 (×6): 3 mL via INTRAVENOUS

## 2022-12-31 MED ORDER — THROMBIN (RECOMBINANT) 20000 UNITS EX SOLR
CUTANEOUS | Status: DC | PRN
  Administered 2022-12-31: 20000 [IU] via TOPICAL

## 2022-12-31 MED ORDER — CHLORHEXIDINE GLUCONATE CLOTH 2 % EX PADS
6.0000 | MEDICATED_PAD | Freq: Every day | CUTANEOUS | Status: DC
  Administered 2022-12-31 – 2023-01-04 (×5): 6 via TOPICAL

## 2022-12-31 MED ORDER — ROCURONIUM BROMIDE 10 MG/ML (PF) SYRINGE
PREFILLED_SYRINGE | INTRAVENOUS | Status: DC | PRN
  Administered 2022-12-31: 30 mg via INTRAVENOUS
  Administered 2022-12-31: 80 mg via INTRAVENOUS
  Administered 2022-12-31: 40 mg via INTRAVENOUS
  Administered 2022-12-31: 20 mg via INTRAVENOUS
  Administered 2022-12-31: 30 mg via INTRAVENOUS

## 2022-12-31 MED ORDER — ALBUMIN HUMAN 5 % IV SOLN
250.0000 mL | INTRAVENOUS | Status: DC | PRN
  Administered 2022-12-31 (×2): 12.5 g via INTRAVENOUS

## 2022-12-31 MED ORDER — CEFAZOLIN SODIUM-DEXTROSE 2-4 GM/100ML-% IV SOLN
2.0000 g | Freq: Three times a day (TID) | INTRAVENOUS | Status: AC
  Administered 2022-12-31 – 2023-01-02 (×6): 2 g via INTRAVENOUS
  Filled 2022-12-31 (×6): qty 100

## 2022-12-31 MED ORDER — BISACODYL 5 MG PO TBEC
10.0000 mg | DELAYED_RELEASE_TABLET | Freq: Every day | ORAL | Status: DC
Start: 2023-01-01 — End: 2023-01-04
  Administered 2023-01-01 – 2023-01-03 (×3): 10 mg via ORAL
  Filled 2022-12-31 (×3): qty 2

## 2022-12-31 MED ORDER — ACETAMINOPHEN 160 MG/5ML PO SOLN: 1000.0000 mg | Freq: Four times a day (QID) | ORAL | Status: DC

## 2022-12-31 MED ORDER — SODIUM CHLORIDE 0.45 % IV SOLN: INTRAVENOUS | Status: DC | PRN

## 2022-12-31 MED ORDER — NOREPINEPHRINE 4 MG/250ML-% IV SOLN
0.0000 ug/min | INTRAVENOUS | Status: DC
  Administered 2023-01-01: 6 ug/min via INTRAVENOUS
  Filled 2022-12-31: qty 250

## 2022-12-31 MED ORDER — MAGNESIUM SULFATE 4 GM/100ML IV SOLN
4.0000 g | Freq: Once | INTRAVENOUS | Status: AC
  Administered 2022-12-31: 4 g via INTRAVENOUS
  Filled 2022-12-31: qty 100

## 2022-12-31 MED ORDER — METOPROLOL TARTRATE 12.5 MG HALF TABLET
12.5000 mg | ORAL_TABLET | Freq: Once | ORAL | Status: DC
  Filled 2022-12-31: qty 1

## 2022-12-31 MED ORDER — ~~LOC~~ CARDIAC SURGERY, PATIENT & FAMILY EDUCATION
Freq: Once | Status: DC
  Filled 2022-12-31: qty 1

## 2022-12-31 MED ORDER — PROTAMINE SULFATE 10 MG/ML IV SOLN
INTRAVENOUS | Status: DC | PRN
  Administered 2022-12-31: 300 mg via INTRAVENOUS

## 2022-12-31 MED ORDER — ROSUVASTATIN CALCIUM 20 MG PO TABS
20.0000 mg | ORAL_TABLET | Freq: Every day | ORAL | Status: DC
  Administered 2023-01-01 – 2023-01-04 (×4): 20 mg via ORAL
  Filled 2022-12-31 (×4): qty 1

## 2022-12-31 MED ORDER — THROMBIN (RECOMBINANT) 20000 UNITS EX SOLR
CUTANEOUS | Status: AC
  Filled 2022-12-31: qty 20000

## 2022-12-31 MED ORDER — ASPIRIN 81 MG PO CHEW: 324.0000 mg | CHEWABLE_TABLET | Freq: Every day | ORAL | Status: DC

## 2022-12-31 MED ORDER — PANTOPRAZOLE SODIUM 40 MG IV SOLR
40.0000 mg | Freq: Every day | INTRAVENOUS | Status: AC
  Administered 2022-12-31 – 2023-01-01 (×2): 40 mg via INTRAVENOUS
  Filled 2022-12-31 (×2): qty 10

## 2022-12-31 MED ORDER — SODIUM CHLORIDE 0.9 % IV SOLN: INTRAVENOUS | Status: DC | PRN

## 2022-12-31 MED ORDER — INSULIN REGULAR(HUMAN) IN NACL 100-0.9 UT/100ML-% IV SOLN: INTRAVENOUS | Status: DC

## 2022-12-31 MED ORDER — SODIUM CHLORIDE 0.9% FLUSH: 10.0000 mL | INTRAVENOUS | Status: DC | PRN

## 2022-12-31 MED ORDER — SODIUM CHLORIDE 0.9 % IV SOLN
250.0000 mL | INTRAVENOUS | Status: AC
  Administered 2023-01-01: 250 mL via INTRAVENOUS

## 2022-12-31 MED ORDER — METOPROLOL TARTRATE 25 MG/10 ML ORAL SUSPENSION: 12.5000 mg | Freq: Two times a day (BID) | ORAL | Status: DC

## 2022-12-31 MED ORDER — PSYLLIUM 95 % PO PACK: 1.0000 | PACK | Freq: Every day | ORAL | Status: DC

## 2022-12-31 SURGICAL SUPPLY — 107 items
ANTIFOG SOL W/FOAM PAD STRL (MISCELLANEOUS) ×2
ARTICLIP LAA PROCLIP II 40 (Clip) ×2 IMPLANT
BAG DECANTER FOR FLEXI CONT (MISCELLANEOUS) ×3 IMPLANT
BLADE CLIPPER SURG (BLADE) ×3 IMPLANT
BLADE STERNUM SYSTEM 6 (BLADE) ×3 IMPLANT
BLADE SURG 11 STRL SS (BLADE) IMPLANT
BNDG ELASTIC 4INX 5YD STR LF (GAUZE/BANDAGES/DRESSINGS) IMPLANT
BNDG ELASTIC 6INX 5YD STR LF (GAUZE/BANDAGES/DRESSINGS) IMPLANT
BNDG GAUZE DERMACEA FLUFF 4 (GAUZE/BANDAGES/DRESSINGS) IMPLANT
CANISTER SUCT 3000ML PPV (MISCELLANEOUS) ×3 IMPLANT
CANNULA ARTERIAL NVNT 3/8 22FR (MISCELLANEOUS) IMPLANT
CANNULA MC2 2 STG 36/46 CONN (CANNULA) IMPLANT
CANNULA VESSEL 3MM BLUNT TIP (CANNULA) IMPLANT
CATH ROBINSON RED A/P 18FR (CATHETERS) ×6 IMPLANT
CATH THORACIC 28FR (CATHETERS) ×3 IMPLANT
CATH THORACIC 36FR (CATHETERS) ×3 IMPLANT
CATH THORACIC 36FR RT ANG (CATHETERS) ×3 IMPLANT
CLAMP ISOLATOR ENCOMPASS RF (ELECTROSURGICAL) IMPLANT
CLIP TI LARGE 6 (CLIP) IMPLANT
CLIP TI MEDIUM 24 (CLIP) IMPLANT
CLIP TI WIDE RED SMALL 24 (CLIP) IMPLANT
CONTAINER PROTECT SURGISLUSH (MISCELLANEOUS) ×6 IMPLANT
DERMABOND ADVANCED .7 DNX12 (GAUZE/BANDAGES/DRESSINGS) IMPLANT
DERMABOND ADVANCED .7 DNX6 (GAUZE/BANDAGES/DRESSINGS) IMPLANT
DEVICE ATRICLIP LAA PRCLPII 40 (Clip) IMPLANT
DRAPE HALF SHEET 40X57 (DRAPES) IMPLANT
DRAPE SRG 135X102X78XABS (DRAPES) ×3 IMPLANT
DRAPE WARM FLUID 44X44 (DRAPES) ×3 IMPLANT
DRSG COVADERM 4X14 (GAUZE/BANDAGES/DRESSINGS) ×3 IMPLANT
ELECT BLADE 4.0 EZ CLEAN MEGAD (MISCELLANEOUS) ×2
ELECT CAUTERY BLADE 6.4 (BLADE) ×3 IMPLANT
ELECT REM PT RETURN 9FT ADLT (ELECTROSURGICAL) ×4
ELECTRODE BLDE 4.0 EZ CLN MEGD (MISCELLANEOUS) IMPLANT
ELECTRODE REM PT RTRN 9FT ADLT (ELECTROSURGICAL) ×6 IMPLANT
FELT TEFLON 1X6 (MISCELLANEOUS) ×3 IMPLANT
GAUZE 4X4 16PLY ~~LOC~~+RFID DBL (SPONGE) ×3 IMPLANT
GAUZE SPONGE 4X4 12PLY STRL (GAUZE/BANDAGES/DRESSINGS) ×6 IMPLANT
GLOVE BIO SURGEON STRL SZ 6 (GLOVE) IMPLANT
GLOVE BIO SURGEON STRL SZ 6.5 (GLOVE) IMPLANT
GLOVE BIO SURGEON STRL SZ7 (GLOVE) IMPLANT
GLOVE BIO SURGEON STRL SZ7.5 (GLOVE) IMPLANT
GLOVE BIOGEL PI IND STRL 6 (GLOVE) IMPLANT
GLOVE BIOGEL PI IND STRL 6.5 (GLOVE) IMPLANT
GLOVE BIOGEL PI IND STRL 7.0 (GLOVE) IMPLANT
GLOVE ECLIPSE 7.0 STRL STRAW (GLOVE) ×6 IMPLANT
GLOVE ORTHO TXT STRL SZ7.5 (GLOVE) IMPLANT
GOWN STRL REUS W/ TWL LRG LVL3 (GOWN DISPOSABLE) ×12 IMPLANT
GOWN STRL REUS W/ TWL XL LVL3 (GOWN DISPOSABLE) ×3 IMPLANT
HEMOSTAT POWDER SURGIFOAM 1G (HEMOSTASIS) ×9 IMPLANT
HEMOSTAT SURGICEL 2X14 (HEMOSTASIS) ×3 IMPLANT
INSERT FOGARTY 61MM (MISCELLANEOUS) IMPLANT
INSERT FOGARTY XLG (MISCELLANEOUS) IMPLANT
KIT BASIN OR (CUSTOM PROCEDURE TRAY) ×3 IMPLANT
KIT SUCTION CATH 14FR (SUCTIONS) ×3 IMPLANT
KIT TURNOVER KIT B (KITS) ×3 IMPLANT
KIT VASOVIEW HEMOPRO 2 VH 4000 (KITS) ×3 IMPLANT
KNIFE MICRO-UNI 3.5 30 DEG (BLADE) ×3 IMPLANT
NS IRRIG 1000ML POUR BTL (IV SOLUTION) ×15 IMPLANT
PACK E OPEN HEART (SUTURE) ×3 IMPLANT
PACK OPEN HEART (CUSTOM PROCEDURE TRAY) ×3 IMPLANT
PAD ARMBOARD 7.5X6 YLW CONV (MISCELLANEOUS) ×6 IMPLANT
PAD ELECT DEFIB RADIOL ZOLL (MISCELLANEOUS) ×3 IMPLANT
PENCIL BUTTON HOLSTER BLD 10FT (ELECTRODE) ×3 IMPLANT
POSITIONER HEAD DONUT 9IN (MISCELLANEOUS) ×3 IMPLANT
PUNCH AORTIC ROTATE 4.0MM (MISCELLANEOUS) IMPLANT
PUNCH AORTIC ROTATE 4.5MM 8IN (MISCELLANEOUS) ×3 IMPLANT
PUNCH AORTIC ROTATE 5MM 8IN (MISCELLANEOUS) IMPLANT
SET MPS 3-ND DEL (MISCELLANEOUS) IMPLANT
SOLUTION ANTFG W/FOAM PAD STRL (MISCELLANEOUS) IMPLANT
SPONGE INTESTINAL PEANUT (DISPOSABLE) IMPLANT
SPONGE T-LAP 18X18 ~~LOC~~+RFID (SPONGE) ×12 IMPLANT
SPONGE T-LAP 4X18 ~~LOC~~+RFID (SPONGE) ×3 IMPLANT
STOPCOCK 4 WAY LG BORE MALE ST (IV SETS) IMPLANT
SUPPORT HEART JANKE-BARRON (MISCELLANEOUS) ×3 IMPLANT
SUT BONE WAX W31G (SUTURE) ×3 IMPLANT
SUT ETHILON 3 0 PS 1 (SUTURE) IMPLANT
SUT MNCRL AB 4-0 PS2 18 (SUTURE) IMPLANT
SUT PROLENE 3 0 SH DA (SUTURE) IMPLANT
SUT PROLENE 3 0 SH1 36 (SUTURE) ×3 IMPLANT
SUT PROLENE 4 0 SH DA (SUTURE) IMPLANT
SUT PROLENE 4-0 RB1 .5 CRCL 36 (SUTURE) IMPLANT
SUT PROLENE 5 0 C 1 36 (SUTURE) IMPLANT
SUT PROLENE 6 0 C 1 30 (SUTURE) IMPLANT
SUT PROLENE 7 0 BV 1 (SUTURE) IMPLANT
SUT PROLENE 7 0 BV1 MDA (SUTURE) ×3 IMPLANT
SUT PROLENE 8 0 BV175 6 (SUTURE) IMPLANT
SUT SILK 1 MH (SUTURE) IMPLANT
SUT SILK 2 0 SH (SUTURE) IMPLANT
SUT STEEL STERNAL CCS#1 18IN (SUTURE) IMPLANT
SUT STEEL SZ 6 DBL 3X14 BALL (SUTURE) IMPLANT
SUT VIC AB 1 CTX36XBRD ANBCTR (SUTURE) ×6 IMPLANT
SUT VIC AB 2-0 CT1 TAPERPNT 27 (SUTURE) IMPLANT
SUT VIC AB 2-0 CTX 27 (SUTURE) IMPLANT
SUT VIC AB 3-0 SH 27X BRD (SUTURE) IMPLANT
SUT VIC AB 3-0 SH 8-18 (SUTURE) IMPLANT
SUT VIC AB 3-0 X1 27 (SUTURE) IMPLANT
SUT VIC AB 4-0 PS2 18 (SUTURE) IMPLANT
SYR BULB IRRIG 60ML STRL (SYRINGE) IMPLANT
SYSTEM SAHARA CHEST DRAIN ATS (WOUND CARE) ×3 IMPLANT
TAPE CLOTH SURG 4X10 WHT LF (GAUZE/BANDAGES/DRESSINGS) IMPLANT
TAPE PAPER 2X10 WHT MICROPORE (GAUZE/BANDAGES/DRESSINGS) IMPLANT
TOWEL GREEN STERILE (TOWEL DISPOSABLE) ×3 IMPLANT
TOWEL GREEN STERILE FF (TOWEL DISPOSABLE) ×3 IMPLANT
TRAY FOLEY SLVR 16FR TEMP STAT (SET/KITS/TRAYS/PACK) ×3 IMPLANT
TUBING LAP HI FLOW INSUFFLATIO (TUBING) ×3 IMPLANT
UNDERPAD 30X36 HEAVY ABSORB (UNDERPADS AND DIAPERS) ×3 IMPLANT
WATER STERILE IRR 1000ML POUR (IV SOLUTION) ×6 IMPLANT

## 2022-12-31 NOTE — Anesthesia Procedure Notes (Signed)
Procedure Name: Intubation Date/Time: 12/31/2022 7:52 AM  Performed by: Waynard Edwards, CRNAPre-anesthesia Checklist: Patient identified, Emergency Drugs available, Suction available and Patient being monitored Patient Re-evaluated:Patient Re-evaluated prior to induction Oxygen Delivery Method: Circle system utilized Preoxygenation: Pre-oxygenation with 100% oxygen Induction Type: IV induction Ventilation: Mask ventilation without difficulty Laryngoscope Size: Mac and 4 Grade View: Grade I Tube type: Oral Tube size: 8.0 mm Number of attempts: 1 Airway Equipment and Method: Stylet and Oral airway Placement Confirmation: ETT inserted through vocal cords under direct vision, positive ETCO2 and breath sounds checked- equal and bilateral Secured at: 23 cm Tube secured with: Tape Dental Injury: Teeth and Oropharynx as per pre-operative assessment  Comments: Intubation by SRNA C. Holmes under supervision by MD Jean Rosenthal

## 2022-12-31 NOTE — Anesthesia Postprocedure Evaluation (Signed)
Anesthesia Post Note  Patient: Jeremiah Dixon  Procedure(s) Performed: CORONARY ARTERY BYPASS GRAFTING X 3, USING LEFT INTERNAL MAMMARY ARTERY AND OPEN HARVESTED LEFT SAPHENOUS VEIN GRAFT, ATTEMPTED ENDOSCOPIC RIGHT SAPHENOUS VEIN HARVEST (Chest) CLIPPING OF ATRIAL APPENDAGE WITH PULMONARY VEIN ISOLATION TRANSESOPHAGEAL ECHOCARDIOGRAM     Patient location during evaluation: SICU Anesthesia Type: General Level of consciousness: sedated and patient remains intubated per anesthesia plan Pain management: pain level controlled Vital Signs Assessment: post-procedure vital signs reviewed and stable Respiratory status: patient on ventilator - see flowsheet for VS and patient remains intubated per anesthesia plan Cardiovascular status: stable (on Epinephrine support) Postop Assessment: no apparent nausea or vomiting Anesthetic complications: no   No notable events documented.  Last Vitals:  Vitals:   12/31/22 1545 12/31/22 1600  BP:    Pulse: 80 80  Resp: (!) 25 16  Temp: (!) 36 C (!) 36.1 C  SpO2: 100% 100%    Last Pain:  Vitals:   12/31/22 1445  TempSrc: Core  PainSc:                  Donta Mcinroy,E. Rmani Kellogg

## 2022-12-31 NOTE — Op Note (Signed)
CARDIOVASCULAR SURGERY OPERATIVE NOTE  12/31/2022  Surgeon:  [name], MD  First Assistant: Lowella Dandy, PA-C and Gershon Crane, PA-c:   An experienced assistant was required given the complexity of this surgery and the standard of surgical care. The assistant was needed for vein harvest, exposure, dissection, suctioning, retraction of delicate tissues and sutures, instrument exchange and for overall help during this procedure.   Preoperative Diagnosis:  Severe multi-vessel coronary artery disease   Postoperative Diagnosis:  Same   Procedure:  Median Sternotomy Extracorporeal circulation 3.   Coronary artery bypass grafting x 3  Left internal mammary artery graft to the LAD SVG to diagonal SVG to OM  4.   Open vein harvest from left lower leg. 5.   Pulmonary vein isolation ablation using Encompass clamp 6.   Clipping of left atrial appendage.   Anesthesia:  General Endotracheal   Clinical History/Surgical Indication:  This 62 year old gentleman has multiple risk factors for heart disease including morbid obesity, hypertension, hyperlipidemia, and prediabetes.  He was evaluated by Dr. Sharyn Lull for chest discomfort in the spring 2023 with a nuclear stress test which showed no reversible ischemia with potential small scar in the apical segment of the inferolateral wall.  Ejection fraction was 57% at that time.  No further workup was performed.  He now presents with a few week history of progressive shortness of breath and lower extremity edema which reached a crescendo on 12/06/2022 prompting call to EMS when he felt like he was going to stop breathing.  He reported some episodes of substernal chest discomfort over the past few months that he thought was heartburn and were sometimes relieved with antacids and sometimes not.  He has also had progressive exertional fatigue.  When he was seen  by EMS he was found to be profoundly hypoxemic.  On presentation to the emergency room he was intubated for acute hypoxemic respiratory failure, suffered PEA arrest requiring CPR.  He was treated for cardiogenic shock with multiple vasopressors.  It was felt that he may have suffered a massive PE and he was treated with thrombolytic therapy in the emergency department.  He never had a CT scan.  Chest x-ray showed pulmonary edema.  Troponin I was 2059 and 2815.  BNP was 403.  He improved with medical therapy and was subsequently extubated and weaned off inotropes.  2D echocardiogram on presentation showed an ejection fraction of 35 to 40% with a dilated LV to 5.8 cm and moderate RV systolic dysfunction with mild RV enlargement.  There was trivial MR and TR.  RHC on presentation showed PA pressure of 49/18 with a mean of 32.  Mean wedge pressure was 10.  Mean right atrial pressure was 8.  Cardiac index was 2.5 with a PVR of 3.5WU.  PAPI was 3.87.  He subsequently underwent cardiac catheterization yesterday which showed 70% mid to distal left main stenosis.  There was 70% ostial LAD stenosis and 95% mid LAD stenosis of a large distal vessel.  There was a moderate-sized diagonal vessel with 40% stenosis.  The left circumflex was occluded at its ostium with filling of a moderate to large marginal branch by collaterals from the LAD.  The RCA was occluded in the proximal to midportion with no significant distal vessel seen.  There was a moderate-sized acute marginal branch.  PA pressure was 53/15 with a mean of 29.  Wedge pressure was 12 and right atrial pressure was 8.  PA saturation was measured at 55%.  I think the primary problem prompting his decline admission was worsening congestive heart failure with pulmonary edema, hypoxemia and marked lower extremity edema.  He has been having intermittent chest discomfort at home which sounds like it could be angina.  I think his best long-term prognosis is going to be with  coronary bypass graft surgery to the LAD, diagonal, and OM.  I am not sure that the distal right coronary circulation is graftable.  I discussed the operative procedure with the patient including alternatives, benefits and risks; including but not limited to bleeding, blood transfusion, infection, stroke, myocardial infarction, graft failure, heart block requiring a permanent pacemaker, organ dysfunction, and death.  Shirlean Mylar understands and agrees to proceed.    Preparation:  The patient was seen in the preoperative holding area and the correct patient, correct operation were confirmed with the patient after reviewing the medical record and catheterization. The consent was signed by me. Preoperative antibiotics were given. A pulmonary arterial line and radial arterial line were placed by the anesthesia team. The patient was taken back to the operating room and positioned supine on the operating room table. After being placed under general endotracheal anesthesia by the anesthesia team a foley catheter was placed. The neck, chest, abdomen, and both legs were prepped with betadine soap and solution and draped in the usual sterile manner. A surgical time-out was taken and the correct patient and operative procedure were confirmed with the nursing and anesthesia staff.  TEE: performed by Dr. Jairo Ben. This showed mild to probably moderate MR with two central jets. MR ERO was 0.15 cm2 and MR volume was 29 cc.   LVEF 35%.   Cardiopulmonary Bypass:  A median sternotomy was performed. The pericardium was opened in the midline. Right ventricular function appeared normal. The ascending aorta was of normal size and had no palpable plaque. There were no contraindications to aortic cannulation or cross-clamping. The patient was fully systemically heparinized and the ACT was maintained > 400 sec. The proximal aortic arch was cannulated with a 22 F aortic cannula for arterial inflow. Venous  cannulation was performed via the right atrial appendage using a two-staged venous cannula. An antegrade cardioplegia/vent cannula was inserted into the mid-ascending aorta. Aortic occlusion was performed with a single cross-clamp. Systemic cooling to 32 degrees Centigrade and topical cooling of the heart with iced saline were used. Hyperkalemic antegrade cold blood cardioplegia was used to induce diastolic arrest and was then given at about 20 minute intervals throughout the period of arrest to maintain myocardial temperature at or below 10 degrees centigrade. A temperature probe was inserted into the interventricular septum and an insulating pad was placed in the pericardium.   Left internal mammary artery harvest:  The left side of the sternum was retracted using the Rultract retractor. There was old hematoma in the chest wall with some rib fractures anteriorly related to recent CPR during his admission. The left internal mammary artery was harvested as a pedicle graft. This was difficult due to the chest wall hematoma and inflammatory reaction from CPR but I was able to harvest it without injury.  All side branches were clipped. It was a medium-sized vessel of good quality with excellent blood flow. It was ligated distally and divided. It was sprayed with topical papaverine solution to prevent vasospasm.   Open vein harvest:  The right greater saphenous vein was examined endoscopically through a 2 cm incision medial to the right knee. It was a very large-sized vein and  it was not felt that endoscopic vein harvest was wise due to the risk of bleeding from the large vein branches after they were cauterized and divided. The left GSV was also large at the knee level. Therefore the GSV from the left lower leg was harvested open. It was a medium caliber vein of good quality. The side branches were all ligated with 4-0 silk ties.   Pulmonary vein isolation ablation:  This was performed with the heart  arrested.  The SVC was mobilized off of the right pulmonary artery.  The oblique sinus was opened.  The rubber catheter with magnetic and's was then passed under the SVC and through the transverse sinus.  The other end of the catheter was passed through the oblique sinus.  The Encompass clamp was passed from left to right without difficulty and closed.  Care was taken to make sure that the left atrial appendage and right atrium were not pinched in the clamp.  Three burns and two clamping's were performed.  Ligation of left atrial appendage:   The base of the appendage was measured and a 40mm Atricure Pro 2 clip was chosen. This was placed across the base of the LAA without difficulty.     Coronary arteries:  The coronary arteries were examined.  LAD:  large vessel with no distal disease. Diagonal was a medium caliber graftable vessel. LCX:  Large OM with no distal disease RCA:  diffusely diseased with small PDA and PL branches that were diffusely diseased and not graftable.   Grafts:  LIMA to the LAD: 2.5 mm. It was sewn end to side using 8-0 prolene continuous suture. SVG to diagonal:  1.6 mm. It was sewn end to side using 7-0 prolene continuous suture. SVG to OM:  2.0 mm. It was sewn end to side using 7-0 prolene continuous suture.  The proximal vein graft anastomoses were performed to the mid-ascending aorta using continuous 6-0 prolene suture. Graft markers were placed around the proximal anastomoses.   Completion:  The patient was rewarmed to 37 degrees Centigrade. The clamp was removed from the LIMA pedicle and there was rapid warming of the septum and return of ventricular fibrillation. The crossclamp was removed with a time of 96 minutes. There was spontaneous return of sinus rhythm. The distal and proximal anastomoses were checked for hemostasis. The position of the grafts was satisfactory. Two temporary epicardial pacing wires were placed on the right atrium and two on the right  ventricle. The patient was weaned from CPB without difficulty on epi 3 mcg. CPB time was 114 minutes. Cardiac output was 5 LPM.  TEE showed unchanged mild to probably moderate mitral regurgitation with improved left ventricular systolic function.  Heparin was fully reversed with protamine and the aortic and venous cannulas removed. Hemostasis was achieved. Mediastinal and left pleural drainage tubes were placed. The sternum was closed with double #6 stainless steel wires. The fascia was closed with continuous # 1 vicryl suture. The subcutaneous tissue was closed with 2-0 vicryl continuous suture. The skin was closed with 3-0 vicryl subcuticular suture. All sponge, needle, and instrument counts were reported correct at the end of the case. Dry sterile dressings were placed over the incisions and around the chest tubes which were connected to pleurevac suction. The patient was then transported to the surgical intensive care unit in stable condition.

## 2022-12-31 NOTE — Transfer of Care (Signed)
Immediate Anesthesia Transfer of Care Note  Patient: Jeremiah Dixon  Procedure(s) Performed: CORONARY ARTERY BYPASS GRAFTING X 3, USING LEFT INTERNAL MAMMARY ARTERY AND OPEN HARVESTED LEFT SAPHENOUS VEIN GRAFT, ATTEMPTED ENDOSCOPIC RIGHT SAPHENOUS VEIN HARVEST (Chest) CLIPPING OF ATRIAL APPENDAGE WITH PULMONARY VEIN ISOLATION TRANSESOPHAGEAL ECHOCARDIOGRAM  Patient Location: ICU  Anesthesia Type:General  Level of Consciousness: sedated and Patient remains intubated per anesthesia plan  Airway & Oxygen Therapy: Patient remains intubated per anesthesia plan and Patient placed on Ventilator (see vital sign flow sheet for setting)  Post-op Assessment: Report given to RN and Post -op Vital signs reviewed and stable  Post vital signs: Reviewed and stable  Last Vitals:  Vitals Value Taken Time  BP 92/48  60 12/31/22 1436  Temp    Pulse 80 12/31/22 1436  Resp 16 12/31/22 1436  SpO2 98 % 12/31/22 1436  Vitals shown include unfiled device data.  Last Pain:  Vitals:   12/31/22 0618  TempSrc:   PainSc: 0-No pain         Complications: No notable events documented.

## 2022-12-31 NOTE — Brief Op Note (Signed)
12/31/2022  2:06 PM  PATIENT:  Jeremiah Dixon  62 y.o. male  PRE-OPERATIVE DIAGNOSIS:  CORONARY ARTERY DISEASE AND ATRIAL FIBRILLATION  POST-OPERATIVE DIAGNOSIS:  CORONARY ARTERY DISEASE AND ATRIAL FIBRILLATION  PROCEDURE:  Procedure(s):  CORONARY ARTERY BYPASS GRAFTING x 3 -LIMA to LAD -SVG to OM -SVG to DIAGONAL  PULMONARY VEIN ISOLATION  CLIPPING OF ATRIAL APPENDAGE  -40 mm Atricure Pro 2 Clip  TRANSESOPHAGEAL ECHOCARDIOGRAM (N/A)  ENDOSCOPIC HARVEST GREATER SAPHENOUS VEIN -Attempted RLE -Left Thigh Explored  OPEN HARVEST GREATER SAPHENOUS VEIN -Left Lower Leg  Vein harvest time: 75 min Vein prep time: 15 min  SURGEON:  Surgeons and Role:    * [name], MD - Primary  PHYSICIAN ASSISTANT: Kaiyana Bedore PA-C, Gershon Crane PA-C  ASSISTANTS: Valene Bors   ANESTHESIA:   general  EBL:  Per Anesthesia Record  BLOOD ADMINISTERED:   CC CELLSAVER  DRAINS:  Left Pleural Chest Tube, Mediastinal Chest Drain    LOCAL MEDICATIONS USED:  NONE  SPECIMEN:  No Specimen  DISPOSITION OF SPECIMEN:  N/A  COUNTS:  YES  TOURNIQUET:  * No tourniquets in log *  DICTATION: .Dragon Dictation  PLAN OF CARE: Admit to inpatient   PATIENT DISPOSITION:  ICU - intubated and hemodynamically stable.   Delay start of Pharmacological VTE agent (>24hrs) due to surgical blood loss or risk of bleeding: yes

## 2022-12-31 NOTE — Anesthesia Procedure Notes (Signed)
Arterial Line Insertion Start/End12/03/2022 6:51 AM, 12/31/2022 6:53 AM Performed by: Waynard Edwards, CRNA, CRNA  Patient location: Pre-op. Preanesthetic checklist: patient identified, IV checked, site marked, risks and benefits discussed, surgical consent, monitors and equipment checked, pre-op evaluation, timeout performed and anesthesia consent Lidocaine 1% used for infiltration Left, radial was placed Catheter size: 20 G Hand hygiene performed  and maximum sterile barriers used   Attempts: 1 Procedure performed without using ultrasound guided technique. Following insertion, dressing applied and Biopatch. Post procedure assessment: normal and unchanged  Patient tolerated the procedure well with no immediate complications.

## 2022-12-31 NOTE — Anesthesia Procedure Notes (Addendum)
Central Venous Catheter Insertion Performed by: Jairo Ben, MD, anesthesiologist Start/End12/03/2022 6:56 AM, 12/31/2022 7:12 AM Preanesthetic checklist: patient identified, IV checked, risks and benefits discussed, surgical consent, monitors and equipment checked, pre-op evaluation, timeout performed and anesthesia consent Position: supine Lidocaine 1% used for infiltration and patient sedated Hand hygiene performed , maximum sterile barriers used  and Seldinger technique used Catheter size: 8 Fr PA cath was placed.Sheath introducer Swan type:thermodilution PA Cath depth:55 Procedure performed using ultrasound guided technique. Ultrasound Notes:anatomy identified, needle tip was noted to be adjacent to the nerve/plexus identified, no ultrasound evidence of intravascular and/or intraneural injection and image(s) printed for medical record Attempts: 1 Following insertion, line sutured, dressing applied and Biopatch. Post procedure assessment: free fluid flow, blood return through all ports and no air  Patient tolerated the procedure well with no immediate complications. Additional procedure comments: PA catheter:  Routine monitors. Timeout, sterile prep, drape, FBP R neck.  Supine position.  1% Lido local, finder and trocar RIJ 1st pass with US guidance.  Cordis placed over J wire. PA catheter in easily.  Sterile dressing applied.  Patient tolerated well, VSS.  Sandford Craze, MD.

## 2022-12-31 NOTE — Progress Notes (Signed)
      [address]       [place] [address]             [phone]      Intubated, waking up, following commands  BP 96/67   Pulse 80   Temp (!) 97.5 F (36.4 C)   Resp 20   Ht 6\' 2"  (1.88 m)   Wt 122.9 kg   SpO2 100%   BMI 34.79 kg/m  35/16 Ci = 2.9 Epi 4, norepi 2  Intake/Output Summary (Last 24 hours) at 12/31/2022 1743 Last data filed at 12/31/2022 1700 Gross per 24 hour  Intake 3948.78 ml  Output 3655 ml  Net 293.78 ml   CT ~ 300 since OR  Hct 27  Doing well early postop  Viviann Spare C. Dorris Fetch, MD [facility] [phone]

## 2022-12-31 NOTE — Hospital Course (Addendum)
History of Present Illness:  Jeremiah Dixon is a 62 yo male with PMH of CAD, morbid obesity, HTN, HLD,  Alcohol abuse, and Prediabetes.  He was brought to [facility] ED via EMS on 11/7 after complaints of chest pain and shortness of breath.  Upon arrival to the ED the patient's sats were 60% and he was on NRB.  He developed PEA arrest and CPR was initiated.  He required emergent intubation and was treated with TNK for concern of possible PE.  CXR obtained showed pulmonary edema.  EKG revealed A. Fib with rates in the 130s.  He was awake and following commands and admitted to the ICU.  He was placed on epinephrine, levo, and vaso for cardiogenic shock.  AHF team was consulted for shock and possible ECMO cannulation.  Post operative echocardiogram showed reduced EF of 30-35% with moderately dilated RV.  They recommended initiation of Milrinone.  They felt with recent administration of lytics it would be ideal to avoid ECMO and try and stabilize with pressors, however if need arose this was an option.  He developed hypotension post Milrinone administration this was discontinued and he was switched to Dobutamine.  He underwent right heart cath on 11/7. His respiratory condition improved and he was able to be extubated on 11/8.  He has been weaned off all pressors.  He developed repeat episodes of Atrial Fibrillation requiring Amiodarone drip.  They have been adding GDMT as tolerated.  He has been treated with ABX for possible pneumonia.  Doppler studies have ruled out LE DVT.  Since he stabilized patient was taken for repeat RHC and LHC this revealed 3V CAD.  It was felt he would likely benefit from possible coronary bypass grafting.  Currently the patient feels like he is making improvements in small amounts considering what he has been through.  He denies chest pain and shortness of breath.  He feels like he has "fluid" in his lungs for which he is taking mucinex and breathing device, but states it comes out of  nowhere.  The patient states he is fairly active.  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  Previously he was working at [employer] at [employer] which was a strenuous job for him.  He denies current smoking, but was a former of 1ppd quiting 10-15 years ago.  He is a heavy drink of daily use, states he drinks 2 40s per day of 8% ABV  prior to admission.  He denies family history of CAD.  It was felt he would require bypass surgery.  He was evaluated by Dr. Laneta Simmers who was in agreement the patient would benefit from surgery, however he would benefit from medical management and recovery from his stroke.  He was discharged to inpatient rehab prior to proceeding with surgery.  Hospital Course:  Jeremiah Dixon was brought to the OR on 12/31/2022.  He was taken to the operating room and underwent CABG x 3 utilizing LIMA to LAD, SVG to OM, and SVG to Diagonal, Pulmonary vein isolation with clipping of Left atrial appendage, open harvest of saphenous vein from his left lower extremity.  Right leg was explored however with varicosities and size of vein this was not felt to be usable.  He tolerated the procedure without difficulty and was taken to the SICU in stable condition.  The patient was extubated the evening of surgery.  He was weaned off Epinephrine, and Norephinephrine as hemodynamics allowed.  He was  in NSR post PV isolation MAZE.  He was resumed on Amiodarone, Digoxin for Atrial Fibrillation. Lopressor was not initially started.  He was resumed on Demadex and Spironolactone to facilitate diuresis.  His swan ganz catheter was removed without difficulty.  The patient's pacing wires, chest tubes, sleeve, and foley.  PT evaluated the patient and recommended CIR placement.  He remained in NSR and was felt stable for transfer to the progressive care unit on 01/03/2023.  The patient has remained in NSR.  His blood pressure has improved and he will be started on Toprol XL at 25 mg daily.  He is  diuresing well.  He has moved his bowels and his previous N/V has resolved.  His surgical incisions are healing without evidence of infection.  He is medically stable for discharge to CIR today.  Note: Patient has sutures in his leg incision.  These will remain in for a minimum of 2 weeks and will be slowly removed at that point.

## 2022-12-31 NOTE — Plan of Care (Signed)
  Problem: Education: Goal: Knowledge of General Education information will improve Description: Including pain rating scale, medication(s)/side effects and non-pharmacologic comfort measures Outcome: Progressing   Problem: Health Behavior/Discharge Planning: Goal: Ability to manage health-related needs will improve Outcome: Progressing   Problem: Clinical Measurements: Goal: Ability to maintain clinical measurements within normal limits will improve Outcome: Progressing Goal: Will remain free from infection Outcome: Progressing Goal: Diagnostic test results will improve Outcome: Progressing Goal: Respiratory complications will improve Outcome: Progressing Goal: Cardiovascular complication will be avoided Outcome: Progressing   Problem: Activity: Goal: Risk for activity intolerance will decrease Outcome: Progressing   Problem: Nutrition: Goal: Adequate nutrition will be maintained Outcome: Progressing   Problem: Coping: Goal: Level of anxiety will decrease Outcome: Progressing   Problem: Elimination: Goal: Will not experience complications related to bowel motility Outcome: Progressing Goal: Will not experience complications related to urinary retention Outcome: Progressing   Problem: Pain Management: Goal: General experience of comfort will improve Outcome: Progressing   Problem: Safety: Goal: Ability to remain free from injury will improve Outcome: Progressing   Problem: Skin Integrity: Goal: Risk for impaired skin integrity will decrease Outcome: Progressing   Problem: Education: Goal: Will demonstrate proper wound care and an understanding of methods to prevent future damage Outcome: Progressing Goal: Knowledge of disease or condition will improve Outcome: Progressing Goal: Knowledge of the prescribed therapeutic regimen will improve Outcome: Progressing Goal: Individualized Educational Video(s) Outcome: Progressing   Problem: Activity: Goal: Risk for  activity intolerance will decrease Outcome: Progressing   Problem: Cardiac: Goal: Will achieve and/or maintain hemodynamic stability Outcome: Progressing   Problem: Clinical Measurements: Goal: Postoperative complications will be avoided or minimized Outcome: Progressing   Problem: Respiratory: Goal: Respiratory status will improve Outcome: Progressing   Problem: Skin Integrity: Goal: Wound healing without signs and symptoms of infection Outcome: Progressing Goal: Risk for impaired skin integrity will decrease Outcome: Progressing   Problem: Urinary Elimination: Goal: Ability to achieve and maintain adequate renal perfusion and functioning will improve Outcome: Progressing

## 2022-12-31 NOTE — Interval H&P Note (Signed)
History and Physical Interval Note:  12/31/2022 6:50 AM  Jeremiah Dixon  has presented today for surgery, with the diagnosis of CAD.  The various methods of treatment have been discussed with the patient and family. After consideration of risks, benefits and other options for treatment, the patient has consented to  Procedure(s): CORONARY ARTERY BYPASS GRAFTING (CABG) (N/A) CLIPPING OF ATRIAL APPENDAGE WITH PULMONARY VEIN ISOLATION (N/A) TRANSESOPHAGEAL ECHOCARDIOGRAM (N/A) as a surgical intervention.  The patient's history has been reviewed, patient examined, no change in status, stable for surgery.  I have reviewed the patient's chart and labs.  Questions were answered to the patient's satisfaction.     [name]

## 2023-01-01 ENCOUNTER — Inpatient Hospital Stay (HOSPITAL_COMMUNITY): Payer: Managed Care, Other (non HMO)

## 2023-01-01 ENCOUNTER — Encounter (HOSPITAL_COMMUNITY): Payer: Self-pay | Admitting: Surgery

## 2023-01-01 LAB — GLUCOSE, CAPILLARY
Glucose-Capillary: 105 mg/dL — ABNORMAL HIGH (ref 70–99)
Glucose-Capillary: 118 mg/dL — ABNORMAL HIGH (ref 70–99)
Glucose-Capillary: 135 mg/dL — ABNORMAL HIGH (ref 70–99)
Glucose-Capillary: 135 mg/dL — ABNORMAL HIGH (ref 70–99)
Glucose-Capillary: 135 mg/dL — ABNORMAL HIGH (ref 70–99)
Glucose-Capillary: 135 mg/dL — ABNORMAL HIGH (ref 70–99)
Glucose-Capillary: 137 mg/dL — ABNORMAL HIGH (ref 70–99)
Glucose-Capillary: 141 mg/dL — ABNORMAL HIGH (ref 70–99)
Glucose-Capillary: 149 mg/dL — ABNORMAL HIGH (ref 70–99)
Glucose-Capillary: 160 mg/dL — ABNORMAL HIGH (ref 70–99)
Glucose-Capillary: 165 mg/dL — ABNORMAL HIGH (ref 70–99)

## 2023-01-01 LAB — CBC
HCT: 27.1 % — ABNORMAL LOW (ref 39.0–52.0)
HCT: 27.3 % — ABNORMAL LOW (ref 39.0–52.0)
Hemoglobin: 8.7 g/dL — ABNORMAL LOW (ref 13.0–17.0)
Hemoglobin: 8.9 g/dL — ABNORMAL LOW (ref 13.0–17.0)
MCH: 31.3 pg (ref 26.0–34.0)
MCH: 32.1 pg (ref 26.0–34.0)
MCHC: 32.1 g/dL (ref 30.0–36.0)
MCHC: 32.6 g/dL (ref 30.0–36.0)
MCV: 97.5 fL (ref 80.0–100.0)
MCV: 98.6 fL (ref 80.0–100.0)
Platelets: 193 10*3/uL (ref 150–400)
Platelets: 175 10*3/uL (ref 150–400)
RBC: 2.78 MIL/uL — ABNORMAL LOW (ref 4.22–5.81)
RBC: 2.77 MIL/uL — ABNORMAL LOW (ref 4.22–5.81)
RDW: 13.6 % (ref 11.5–15.5)
RDW: 13.6 % (ref 11.5–15.5)
WBC: 9.6 10*3/uL (ref 4.0–10.5)
WBC: 9.1 10*3/uL (ref 4.0–10.5)
nRBC: 0 % (ref 0.0–0.2)
nRBC: 0 % (ref 0.0–0.2)

## 2023-01-01 LAB — BASIC METABOLIC PANEL
Anion gap: 8 (ref 5–15)
Anion gap: 9 (ref 5–15)
BUN: 5 mg/dL — ABNORMAL LOW (ref 8–23)
BUN: 9 mg/dL (ref 8–23)
CO2: 20 mmol/L — ABNORMAL LOW (ref 22–32)
CO2: 22 mmol/L (ref 22–32)
Calcium: 8 mg/dL — ABNORMAL LOW (ref 8.9–10.3)
Calcium: 8.4 mg/dL — ABNORMAL LOW (ref 8.9–10.3)
Chloride: 105 mmol/L (ref 98–111)
Chloride: 99 mmol/L (ref 98–111)
Creatinine, Ser: 0.78 mg/dL (ref 0.61–1.24)
Creatinine, Ser: 0.66 mg/dL (ref 0.61–1.24)
GFR, Estimated: >60 mL/min (ref >60)
GFR, Estimated: >60 mL/min (ref >60)
Glucose, Bld: 132 mg/dL — ABNORMAL HIGH (ref 70–99)
Glucose, Bld: 178 mg/dL — ABNORMAL HIGH (ref 70–99)
Potassium: 4.2 mmol/L (ref 3.5–5.1)
Potassium: 4.2 mmol/L (ref 3.5–5.1)
Sodium: 133 mmol/L — ABNORMAL LOW (ref 135–145)
Sodium: 130 mmol/L — ABNORMAL LOW (ref 135–145)

## 2023-01-01 LAB — MAGNESIUM
Magnesium: 2.5 mg/dL — ABNORMAL HIGH (ref 1.7–2.4)
Magnesium: 2.2 mg/dL (ref 1.7–2.4)

## 2023-01-01 MED ORDER — POTASSIUM CHLORIDE CRYS ER 20 MEQ PO TBCR
40.0000 meq | EXTENDED_RELEASE_TABLET | Freq: Once | ORAL | Status: AC
  Administered 2023-01-01: 40 meq via ORAL
  Filled 2023-01-01: qty 2

## 2023-01-01 MED ORDER — METOLAZONE 2.5 MG PO TABS
2.5000 mg | ORAL_TABLET | Freq: Once | ORAL | Status: AC
  Administered 2023-01-01: 2.5 mg via ORAL
  Filled 2023-01-01: qty 1

## 2023-01-01 MED ORDER — FUROSEMIDE 10 MG/ML IJ SOLN
40.0000 mg | Freq: Once | INTRAMUSCULAR | Status: AC
  Administered 2023-01-01: 40 mg via INTRAVENOUS
  Filled 2023-01-01: qty 4

## 2023-01-01 MED ORDER — INSULIN DETEMIR 100 UNIT/ML ~~LOC~~ SOLN
30.0000 [IU] | Freq: Every day | SUBCUTANEOUS | Status: DC
  Administered 2023-01-01: 30 [IU] via SUBCUTANEOUS
  Filled 2023-01-01 (×2): qty 0.3

## 2023-01-01 MED ORDER — INSULIN ASPART 100 UNIT/ML IJ SOLN
0.0000 [IU] | INTRAMUSCULAR | Status: DC
  Administered 2023-01-01 – 2023-01-02 (×3): 2 [IU] via SUBCUTANEOUS

## 2023-01-01 MED ORDER — INSULIN ASPART 100 UNIT/ML IJ SOLN: 0.0000 [IU] | INTRAMUSCULAR | Status: DC

## 2023-01-01 MED ORDER — ENOXAPARIN SODIUM 40 MG/0.4ML IJ SOSY
40.0000 mg | PREFILLED_SYRINGE | Freq: Every day | INTRAMUSCULAR | Status: DC
  Administered 2023-01-01 – 2023-01-03 (×3): 40 mg via SUBCUTANEOUS
  Filled 2023-01-01 (×3): qty 0.4

## 2023-01-01 MED FILL — Potassium Chloride Inj 2 mEq/ML: INTRAVENOUS | Qty: 40 | Status: AC

## 2023-01-01 MED FILL — Lidocaine HCl Local Preservative Free (PF) Inj 2%: INTRAMUSCULAR | Qty: 14 | Status: AC

## 2023-01-01 MED FILL — Heparin Sodium (Porcine) Inj 1000 Unit/ML: Qty: 1000 | Status: AC

## 2023-01-01 NOTE — Progress Notes (Signed)
Inpatient Rehab Admissions Coordinator Note:   Per PT recommendations patient was screened for CIR candidacy by Stephania Fragmin, PT. At this time, pt appears to be a potential candidate for CIR. I will place an order for rehab consult for full assessment, per our protocol.  Note that pt discharged home alone at independent level from CIR on 11/26 with only intermittent assist available.  Per PT note plan for ILF/ALF at discharge.  Discharge plan/location will need to be confirmed prior to being able to any potential rehab stay.  Please contact me any with questions.Estill Dooms, PT, DPT [phone] 01/01/23 2:53 PM

## 2023-01-01 NOTE — Discharge Summary (Signed)
[address]       [place] [address]             [phone]    Physician Discharge Summary  Patient ID: Jeremiah Dixon MRN: [id number] DOB/AGE: 62-Jun-1962 62 y.o.  Admit date: 12/31/2022 Discharge date: 01/04/2023  Admission Diagnoses:  Patient Active Problem List   Diagnosis Date Noted   Debility 12/19/2022   Acute clinical systolic heart failure (HCC) 12/14/2022   Coronary artery disease 12/14/2022   Essential hypertension 12/14/2022   Hyponatremia 12/14/2022   Pre-diabetes 12/14/2022   Chronic anemia 12/14/2022   Obesity, class 2 12/14/2022   Cardiogenic shock (HCC) 12/07/2022   Atrial fibrillation (HCC) 12/07/2022   Cardiac volume overload 12/07/2022   Cardiac arrest (HCC) 12/06/2022   Acute respiratory failure with hypoxia (HCC) 12/06/2022   Discharge Diagnoses:  Patient Active Problem List   Diagnosis Date Noted   S/P CABG x 3 12/31/2022   Debility 12/19/2022   Acute clinical systolic heart failure (HCC) 12/14/2022   Coronary artery disease 12/14/2022   Essential hypertension 12/14/2022   Hyponatremia 12/14/2022   Pre-diabetes 12/14/2022   Chronic anemia 12/14/2022   Obesity, class 2 12/14/2022   Cardiogenic shock (HCC) 12/07/2022   Atrial fibrillation (HCC) 12/07/2022   Cardiac volume overload 12/07/2022   Cardiac arrest (HCC) 12/06/2022   Acute respiratory failure with hypoxia (HCC) 12/06/2022   Discharged Condition: good  History of Present Illness:  Jeremiah Dixon is a 62 yo male with PMH of CAD, morbid obesity, HTN, HLD,  Alcohol abuse, and Prediabetes.  He was brought to [facility] ED via EMS on 11/7 after complaints of chest pain and shortness of breath.  Upon arrival to the ED the patient's sats were 60% and he was on NRB.  He developed PEA arrest and CPR was initiated.  He required emergent intubation and was treated with TNK for concern of possible PE.  CXR obtained showed pulmonary edema.  EKG revealed A. Fib with  rates in the 130s.  He was awake and following commands and admitted to the ICU.  He was placed on epinephrine, levo, and vaso for cardiogenic shock.  AHF team was consulted for shock and possible ECMO cannulation.  Post operative echocardiogram showed reduced EF of 30-35% with moderately dilated RV.  They recommended initiation of Milrinone.  They felt with recent administration of lytics it would be ideal to avoid ECMO and try and stabilize with pressors, however if need arose this was an option.  He developed hypotension post Milrinone administration this was discontinued and he was switched to Dobutamine.  He underwent right heart cath on 11/7. His respiratory condition improved and he was able to be extubated on 11/8.  He has been weaned off all pressors.  He developed repeat episodes of Atrial Fibrillation requiring Amiodarone drip.  They have been adding GDMT as tolerated.  He has been treated with ABX for possible pneumonia.  Doppler studies have ruled out LE DVT.  Since he stabilized patient was taken for repeat RHC and LHC this revealed 3V CAD.  It was felt he would likely benefit from possible coronary bypass grafting.  Currently the patient feels like he is making improvements in small amounts considering what he has been through.  He denies chest pain and shortness of breath.  He feels like he has "fluid" in his lungs for which he is taking mucinex and breathing device, but states it comes out of nowhere.  The  patient states he is fairly active.  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  Previously he was working at [employer] at [employer] which was a strenuous job for him.  He denies current smoking, but was a former of 1ppd quiting 10-15 years ago.  He is a heavy drink of daily use, states he drinks 2 40s per day of 8% ABV  prior to admission.  He denies family history of CAD.  It was felt he would require bypass surgery.  He was evaluated by Dr. Laneta Simmers who was in agreement  the patient would benefit from surgery, however he would benefit from medical management and recovery from his stroke.  He was discharged to inpatient rehab prior to proceeding with surgery.  Hospital Course:  Jeremiah Dixon was brought to the OR on 12/31/2022.  He was taken to the operating room and underwent CABG x 3 utilizing LIMA to LAD, SVG to OM, and SVG to Diagonal, Pulmonary vein isolation with clipping of Left atrial appendage, open harvest of saphenous vein from his left lower extremity.  Right leg was explored however with varicosities and size of vein this was not felt to be usable.  He tolerated the procedure without difficulty and was taken to the SICU in stable condition.  The patient was extubated the evening of surgery.  He was weaned off Epinephrine, and Norephinephrine as hemodynamics allowed.  He was in NSR post PV isolation MAZE.  He was resumed on Amiodarone, Digoxin for Atrial Fibrillation. Lopressor was not initially started.  He was resumed on Demadex and Spironolactone to facilitate diuresis.  His swan ganz catheter was removed without difficulty.  The patient's pacing wires, chest tubes, sleeve, and foley.  PT evaluated the patient and recommended CIR placement.  He remained in NSR and was felt stable for transfer to the progressive care unit on 01/03/2023.  The patient has remained in NSR.  His blood pressure has improved and he will be started on Toprol XL at 25 mg daily.  He is diuresing well.  He has moved his bowels and his previous N/V has resolved.  His surgical incisions are healing without evidence of infection.  He is medically stable for discharge to CIR today.  Note: Patient has sutures in his leg incision.  These will remain in for a minimum of 2 weeks and will be slowly removed at that point.   Consults: None  Significant Diagnostic Studies: angiography:     Prox RCA to Mid RCA lesion is 100% stenosed.   Prox RCA lesion is 60% stenosed.   Ost LAD lesion is 70%  stenosed.   Mid LM to Dist LM lesion is 70% stenosed.   Ost Cx lesion is 100% stenosed.   Mid LAD lesion is 95% stenosed.   Prox LAD to Mid LAD lesion is 50% stenosed.   2nd Diag lesion is 40% stenosed.   Findings:   Ao = 101/66 (79)  LV = 99/12 RA = 8 RV = 45/10 PA = 53/15 (29) PCW = 12 Fick cardiac output/index = 8.1/3.2 PVR = 2.1 WU Ao sat = 89% PA sat = 55%, 54% PAPi = 4.6   Assessment: 1. Severe 3v heavily calcified CAD with CTO of Ostial LCx and midRCA. Mid LAD 95% 2. Unable to assess LVEF by V-gram  3. Mild to moderate PAH with normal PCWP and output   Plan/Discussion:    Needs CABG but with recent massive PE will need  to discuss timing with TCTS. Will consult. Repeat echo    Arvilla Meres, MD  5:54 PM   Treatments: surgery:   12/31/2022   Surgeon:  [name], MD   First Assistant: Lowella Dandy, PA-C and Gershon Crane, PA-c:   An experienced assistant was required given the complexity of this surgery and the standard of surgical care. The assistant was needed for vein harvest, exposure, dissection, suctioning, retraction of delicate tissues and sutures, instrument exchange and for overall help during this procedure.     Preoperative Diagnosis:  Severe multi-vessel coronary artery disease     Postoperative Diagnosis:  Same     Procedure:   Median Sternotomy Extracorporeal circulation 3.   Coronary artery bypass grafting x 3   Left internal mammary artery graft to the LAD SVG to diagonal SVG to OM   4.   Open vein harvest from left lower leg. 5.   Pulmonary vein isolation ablation using Encompass clamp 6.   Clipping of left atrial appendage.   Discharge Exam: Blood pressure 123/82, pulse 86, temperature 98.4 F (36.9 C), temperature source Oral, resp. rate 20, height 6\' 3"  (1.905 m), weight 127.1 kg, SpO2 97%.  General appearance: alert, cooperative, and no distress Heart: regular rate and rhythm Lungs: clear to auscultation  bilaterally Abdomen: soft, non-tender; bowel sounds normal; no masses,  no organomegaly Extremities: edema trace Wound: clean sternotomy, RLE incision clean, minor bloody oozing, multiple sutures in place.Marland Kitchen ecchymosis BLE  Discharge Medications:  The patient has been discharged on:   1.Beta Blocker:  Yes [ X  ]                              No   [   ]                              If No, reason:  2.Ace Inhibitor/ARB: Yes [   ]                                     No  [ X   ]                                     If No, reason: titration of Lopressor, intolerance  3.Statin:   Yes Jeremiah Dixon   ]                  No  [   ]                  If No, reason:  4.Ecasa:  Yes  [  X ]                  No   [   ]                  If No, reason:  Patient had ACS upon admission: No  Plavix/P2Y12 inhibitor: Yes [   ]                                      No  [x   ]   Discharge  Instructions     AMB Referral to Cardiac Rehabilitation - Phase II   Complete by: As directed    Diagnosis: CABG   CABG X ___: 3   After initial evaluation and assessments completed: Virtual Based Care may be provided alone or in conjunction with Phase 2 Cardiac Rehab based on patient barriers.: Yes   Intensive Cardiac Rehabilitation (ICR) [facility] location only OR Traditional Cardiac Rehabilitation (TCR) *If criteria for ICR are not met will enroll in TCR [facility] only): Yes      Allergies as of 01/04/2023       Reactions   Bee Venom Palpitations   Diclofenac Diarrhea   Amoxicillin Diarrhea, Nausea Only   Lisinopril Cough   Pantoprazole Nausea Only   Other Reaction(s): chest pain        Medication List     TAKE these medications    acetaminophen 325 MG tablet Commonly known as: TYLENOL Take 1-2 tablets (325-650 mg total) by mouth every 4 (four) hours as needed for mild pain (pain score 1-3).   amiodarone 200 MG tablet Commonly known as: PACERONE Take 1 tablet (200 mg total) by mouth 2 (two) times daily.    apixaban 5 MG Tabs tablet Commonly known as: ELIQUIS Take 1 tablet (5 mg total) by mouth 2 (two) times daily.   Aspirin Low Dose 81 MG chewable tablet Generic drug: aspirin Chew 1 tablet (81 mg total) by mouth daily.   digoxin 0.125 MG tablet Commonly known as: LANOXIN Take 1 tablet (0.125 mg total) by mouth daily.   enoxaparin 120 MG/0.8ML injection Commonly known as: LOVENOX Inject 0.8 mLs (120 mg total) into the skin every 12 (twelve) hours for 10 doses.   esomeprazole 40 MG capsule Commonly known as: NEXIUM Take 1 capsule (40 mg total) by mouth 2 (two) times daily before a meal.   Fe Fum-Vit C-Vit B12-FA Caps capsule Commonly known as: TRIGELS-F FORTE Take 1 capsule by mouth daily.   guaiFENesin 600 MG 12 hr tablet Commonly known as: MUCINEX Take 1 tablet (600 mg total) by mouth 2 (two) times daily. What changed: Another medication with the same name was removed. Continue taking this medication, and follow the directions you see here.   metoprolol succinate 25 MG 24 hr tablet Commonly known as: TOPROL-XL Take 1 tablet (25 mg total) by mouth daily. What changed:  medication strength how much to take additional instructions   nitroGLYCERIN 0.4 MG SL tablet Commonly known as: NITROSTAT Place 0.4 mg under the tongue every 5 (five) minutes as needed.   oxyCODONE 5 MG immediate release tablet Commonly known as: Oxy IR/ROXICODONE Take 1 tablet (5 mg total) by mouth every 4 (four) hours as needed for severe pain (pain score 7-10).   psyllium 95 % Pack Commonly known as: HYDROCIL/METAMUCIL Take 1 packet by mouth daily.   rosuvastatin 20 MG tablet Commonly known as: CRESTOR Take 1 tablet (20 mg total) by mouth daily.   spironolactone 25 MG tablet Commonly known as: ALDACTONE Take 1 tablet (25 mg total) by mouth daily.   torsemide 20 MG tablet Commonly known as: DEMADEX Take 2 tablets (40 mg total) by mouth daily.   traZODone 100 MG tablet Commonly known as:  DESYREL Take 1 tablet (100 mg total) by mouth at bedtime as needed for sleep.        Follow-up Information     [facility] Follow up on 01/14/2023.   Specialty: Cardiothoracic Surgery Why: Appointment is at  10:00 Contact information: [address] [place], [address] [place] [address] [phone]        [facility] Follow up on 01/28/2023.   Why: Please get CXR at 11:30 prior to your appointment at Dr. Sharee Pimple office Contact information: [address] [place] [address]        [name], MD Follow up on 01/28/2023.   Specialty: Cardiothoracic Surgery Why: Appointment is at 12:30 Contact information: [address] [place] [address] [phone]         Perlie Gold, PA-C Follow up on 01/31/2023.   Specialty: Cardiology Why: Appointment is at 8:50 Contact information: [address] [place], [address] [place] [address] [phone]                 Signed:  Lowella Dandy, PA-C  01/04/2023, 7:58 AM

## 2023-01-01 NOTE — Progress Notes (Signed)
1 Day Post-Op Procedure(s) (LRB): CORONARY ARTERY BYPASS GRAFTING X 3, USING LEFT INTERNAL MAMMARY ARTERY AND OPEN HARVESTED LEFT SAPHENOUS VEIN GRAFT, ATTEMPTED ENDOSCOPIC RIGHT SAPHENOUS VEIN HARVEST (N/A) CLIPPING OF ATRIAL APPENDAGE WITH PULMONARY VEIN ISOLATION (N/A) TRANSESOPHAGEAL ECHOCARDIOGRAM (N/A) Subjective: No complaints.  Objective: Vital signs in last 24 hours: Temp:  [96.3 F (35.7 C)-99.7 F (37.6 C)] 99.3 F (37.4 C) (12/03 0630) Pulse Rate:  [59-98] 87 (12/03 0630) Cardiac Rhythm: Atrial paced (12/02 2000) Resp:  [10-27] 23 (12/03 0630) BP: (84-129)/(48-78) 84/68 (12/02 1930) SpO2:  [88 %-100 %] 97 % (12/03 0630) Arterial Line BP: (87-136)/(46-64) 116/55 (12/03 0630) FiO2 (%):  [40 %-50 %] 40 % (12/02 1740) Weight:  [128.6 kg] 128.6 kg (12/03 0500)  Hemodynamic parameters for last 24 hours: PAP: (13-46)/(1-26) 30/13 CO:  [5.4 L/min-7.3 L/min] 6.3 L/min CI:  [2.2 L/min/m2-2.95 L/min/m2] 2.53 L/min/m2  Intake/Output from previous day: 12/02 0701 - 12/03 0700 In: 4098.1 [P.O.:1230; I.V.:3177.9; Blood:445; IV Piggyback:2143.9] Out: 4890 [Urine:3590; Blood:610; Chest Tube:690] Intake/Output this shift: Total I/O In: 2800 [P.O.:1230; I.V.:1008.3; IV Piggyback:561.6] Out: 1010 [Urine:560; Chest Tube:450]  General appearance: alert and cooperative Neurologic: intact Heart: regular rate and rhythm Lungs: clear to auscultation bilaterally Extremities: edema mild Wound: dressings dry  Lab Results: Recent Labs    12/31/22 2007 01/01/23 0511  WBC 12.1* 9.6  HGB 9.0* 8.7*  HCT 27.4* 27.1*  PLT 209 193   BMET:  Recent Labs    12/31/22 2007 01/01/23 0511  NA 138 133*  K 4.0 4.2  CL 108 105  CO2 23 20*  GLUCOSE 202* 132*  BUN 6* 5*  CREATININE 0.75 0.78  CALCIUM 8.0* 8.0*    PT/INR:  Recent Labs    12/31/22 1450  LABPROT 16.4*  INR 1.3*   ABG    Component Value Date/Time   PHART 7.314 (L) 12/31/2022 1906   HCO3 23.3 12/31/2022 1906    TCO2 25 12/31/2022 1906   ACIDBASEDEF 3.0 (H) 12/31/2022 1906   O2SAT 99 12/31/2022 1906   CBG (last 3)  Recent Labs    01/01/23 0310 01/01/23 0514 01/01/23 0651  GLUCAP 137* 135* 135*   CXR: interstitial edema  ECG: sinus, no acute changes  Assessment/Plan: S/P Procedure(s) (LRB): CORONARY ARTERY BYPASS GRAFTING X 3, USING LEFT INTERNAL MAMMARY ARTERY AND OPEN HARVESTED LEFT SAPHENOUS VEIN GRAFT, ATTEMPTED ENDOSCOPIC RIGHT SAPHENOUS VEIN HARVEST (N/A) CLIPPING OF ATRIAL APPENDAGE WITH PULMONARY VEIN ISOLATION (N/A) TRANSESOPHAGEAL ECHOCARDIOGRAM (N/A)  POD 1  Hemodynamically stable on Epi 5, NE 5. Wean as tolerated. Preop EF 35%.  Hx of PAF: now s/p pulmonary vein isolation ablation and LAA clipping. Maintaining sinus rhythm on po amio. Hold off on beta blocker while on vasopressors.  Wt is 12.5 lbs over preop. Start diuresis.  Keep chest tubes in for now.  DC swan.   IS, OOB as tolerated.  Will consult PT/OT who saw pt preop. He had brief stay in CIR preop.   LOS: 1 day    Jeremiah Dixon 01/01/2023

## 2023-01-01 NOTE — Evaluation (Signed)
Physical Therapy Evaluation Patient Details Name: Jeremiah Dixon MRN: [id number] DOB: 1960/12/21 Today's Date: 01/01/2023  History of Present Illness  Jeremiah Dixon is a 62 yo male admitted for elective CABGx3 on 12/2. PMH: CAD, morbid obesity, HTN, HLD,  Alcohol abuse, and Prediabetes.  Recent hospital stay on 12/06/22 for respiratory arrest.   Clinical Impression  Pt underwent above surgery. Pt anxious but responded well to PT and RN and was able to follow directions and mobilized well for first time up. Tolerated ambulation of 200' with eva walker on 2Lo2 via [place] with 3 standing rest breaks. PTA pt lived alone however pt states that his brother is trying to find an ALF for him to live in. Recommend inpatient rehab program >3hrs a day to achieve safe mod I level of function to transition to ALF or ILF once set up by family.         If plan is discharge home, recommend the following: Assistance with cooking/housework;Assist for transportation;A little help with walking and/or transfers;Help with stairs or ramp for entrance;A little help with bathing/dressing/bathroom   Can travel by private vehicle        Equipment Recommendations Rolling walker (2 wheels)  Recommendations for Other Services  Rehab consult    Functional Status Assessment Patient has had a recent decline in their functional status and demonstrates the ability to make significant improvements in function in a reasonable and predictable amount of time.     Precautions / Restrictions Precautions Precautions: Fall;Sternal Precaution Booklet Issued: Yes (comment) Precaution Comments: pt with verbal understanding, likes using a regular pillow instead of heart pillow Restrictions Weight Bearing Restrictions: Yes RUE Weight Bearing: Non weight bearing LUE Weight Bearing: Non weight bearing      Mobility  Bed Mobility Overal bed mobility: Needs Assistance Bed Mobility: Rolling, Sidelying to Sit Rolling: Min  assist Sidelying to sit: Min assist       General bed mobility comments: max directional verbal cues, minA for trunk elevation    Transfers Overall transfer level: Needs assistance Equipment used: None Transfers: Sit to/from Stand Sit to Stand: Min assist           General transfer comment: verbal cues for rocking momentum and hands on knees to adhere to sternal precautions, minA to power up and control descent to chair    Ambulation/Gait Ambulation/Gait assistance: Min assist, +2 safety/equipment Gait Distance (Feet): 200 Feet Assistive device: Fara Boros Gait Pattern/deviations: Step-through pattern, Decreased stride length   Gait velocity interpretation: 1.31 - 2.62 ft/sec, indicative of limited community ambulator   General Gait Details: pt initially with increased trunk flexion and short shuffled steps, verbal cues to complete heel/toe gait pattern and increased step length and maintain full upright trunk, pt much improved with fluency of gait pattern by end of amb, minA for eva walker management and lines/chair follow. pt with report of fatigue requiring 2 standing rest breaks and dizziness x1 however with deep breathing pt able to amb and dizziness ceased  Stairs            Wheelchair Mobility     Tilt Bed    Modified Rankin (Stroke Patients Only)       Balance Overall balance assessment: Needs assistance Sitting-balance support: No upper extremity supported, Feet supported Sitting balance-Leahy Scale: Good     Standing balance support: Bilateral upper extremity supported, During functional activity Standing balance-Leahy Scale: Fair Standing balance comment: very light support on RW throughout functional mobility  Pertinent Vitals/Pain Pain Assessment Pain Assessment: 0-10 Pain Score: 4  Pain Location: sternal incision Pain Descriptors / Indicators: Sore    Home Living Family/patient expects to be  discharged to:: Private residence Living Arrangements: Alone Available Help at Discharge: Available PRN/intermittently (brother nearby) Type of Home: Apartment Home Access: Stairs to enter Entrance Stairs-Rails: Doctor, general practice of Steps: 10 + landing + 10 -  bilateral rails on first flight and landing but on 2nd flight there is only 1 rail on R side   Home Layout: Other (Comment) (lives on second floor of apartment building with flight of steps to his unit, has bilateral railings on first set of steps then railing on right for second set of steps) Home Equipment: None Additional Comments: pt reports "My brother is finding me a place to get rehab and then stay. Like assisted living or something."    Prior Function Prior Level of Function : Independent/Modified Independent (works at [employer])             Mobility Comments: pt with recent hospital stay follow by short CIR stay and then returned home with brother last tuesday 11/26 until monday 12/2 morning ADLs Comments: needed some assist post recent hospital stay     Extremity/Trunk Assessment   Upper Extremity Assessment Upper Extremity Assessment: Generalized weakness    Lower Extremity Assessment Lower Extremity Assessment: Generalized weakness    Cervical / Trunk Assessment Cervical / Trunk Assessment: Normal  Communication   Communication Communication: No apparent difficulties  Cognition Arousal: Alert Behavior During Therapy: Anxious Overall Cognitive Status: Within Functional Limits for tasks assessed                                 General Comments: Pt is anxious regarding mobilizing for the first time. Pt responded well to both RN and PT's verbal cues and became less anxious        General Comments General comments (skin integrity, edema, etc.): VSS    Exercises Other Exercises Other Exercises: gave heel/toe raises and LAQx10 reps/hr for seated HEP in chair   Assessment/Plan     PT Assessment Patient needs continued PT services  PT Problem List Decreased activity tolerance;Decreased balance;Decreased mobility;Decreased knowledge of use of DME;Decreased knowledge of precautions;Cardiopulmonary status limiting activity       PT Treatment Interventions Gait training;Stair training;Functional mobility training;Therapeutic activities;Balance training;Patient/family education    PT Goals (Current goals can be found in the Care Plan section)  Acute Rehab PT Goals Patient Stated Goal: Independent, back to work. PT Goal Formulation: With patient Time For Goal Achievement: 01/15/23 Potential to Achieve Goals: Good    Frequency Min 1X/week     Co-evaluation               AM-PAC PT "6 Clicks" Mobility  Outcome Measure Help needed turning from your back to your side while in a flat bed without using bedrails?: A Lot Help needed moving from lying on your back to sitting on the side of a flat bed without using bedrails?: A Lot Help needed moving to and from a bed to a chair (including a wheelchair)?: A Little Help needed standing up from a chair using your arms (e.g., wheelchair or bedside chair)?: A Little Help needed to walk in hospital room?: A Little Help needed climbing 3-5 steps with a railing? : A Lot 6 Click Score: 15    End of Session Equipment Utilized During  Treatment: Gait belt Activity Tolerance: Patient tolerated treatment well Patient left: with call bell/phone within reach;in chair Nurse Communication: Mobility status PT Visit Diagnosis: Other abnormalities of gait and mobility (R26.89);Muscle weakness (generalized) (M62.81)    Time: 4696-2952 PT Time Calculation (min) (ACUTE ONLY): 33 min   Charges:   PT Evaluation $PT Eval Moderate Complexity: 1 Mod PT Treatments $Gait Training: 8-22 mins PT General Charges $$ ACUTE PT VISIT: 1 Visit         Lewis Shock, PT, DPT Acute Rehabilitation Services Secure chat  preferred Office #: [phone]   Iona Hansen 01/01/2023, 12:55 PM

## 2023-01-01 NOTE — Discharge Instructions (Signed)
Discharge Instructions:  1. You may shower, please wash incisions daily with soap and water and keep dry.  If you wish to cover wounds with dressing you may do so but please keep clean and change daily.  No tub baths or swimming until incisions have completely healed.  If your incisions become red or develop any drainage please call our office at [phone]  2. No Driving until cleared by Dr.Barlte's office and you are no longer using narcotic pain medications  3. Monitor your weight daily.. Please use the same scale and weigh at same time... If you gain 5-10 lbs in 48 hours with associated lower extremity swelling, please contact our office at [phone]  4. Fever of 101.5 for at least 24 hours with no source, please contact our office at [phone]  5. Activity- up as tolerated, please walk at least 3 times per day.  Avoid strenuous activity, no lifting, pushing, or pulling with your arms over 8-10 lbs for a minimum of 6 weeks  6. If any questions or concerns arise, please do not hesitate to contact our office at [phone]

## 2023-01-01 NOTE — Progress Notes (Signed)
Patient ID: Jeremiah Dixon, male   DOB: 26-Jul-1960, 62 y.o.   MRN: [id number] TCTS Evening Rounds:  Hemodynamically stable off NE.  Ambulated twice.  Ct output low. Will remove in am.  Continue diuresis tonight.

## 2023-01-02 ENCOUNTER — Inpatient Hospital Stay (HOSPITAL_COMMUNITY): Payer: Managed Care, Other (non HMO)

## 2023-01-02 LAB — CBC
HCT: 24.7 % — ABNORMAL LOW (ref 39.0–52.0)
Hemoglobin: 8 g/dL — ABNORMAL LOW (ref 13.0–17.0)
MCH: 31.7 pg (ref 26.0–34.0)
MCHC: 32.4 g/dL (ref 30.0–36.0)
MCV: 98 fL (ref 80.0–100.0)
Platelets: 136 10*3/uL — ABNORMAL LOW (ref 150–400)
RBC: 2.52 MIL/uL — ABNORMAL LOW (ref 4.22–5.81)
RDW: 13.7 % (ref 11.5–15.5)
WBC: 7.7 10*3/uL (ref 4.0–10.5)
nRBC: 0 % (ref 0.0–0.2)

## 2023-01-02 LAB — BASIC METABOLIC PANEL
Anion gap: 5 (ref 5–15)
BUN: 9 mg/dL (ref 8–23)
CO2: 24 mmol/L (ref 22–32)
Calcium: 8.1 mg/dL — ABNORMAL LOW (ref 8.9–10.3)
Chloride: 98 mmol/L (ref 98–111)
Creatinine, Ser: 0.8 mg/dL (ref 0.61–1.24)
GFR, Estimated: >60 mL/min (ref >60)
Glucose, Bld: 124 mg/dL — ABNORMAL HIGH (ref 70–99)
Potassium: 4 mmol/L (ref 3.5–5.1)
Sodium: 127 mmol/L — ABNORMAL LOW (ref 135–145)

## 2023-01-02 LAB — GLUCOSE, CAPILLARY
Glucose-Capillary: 121 mg/dL — ABNORMAL HIGH (ref 70–99)
Glucose-Capillary: 188 mg/dL — ABNORMAL HIGH (ref 70–99)
Glucose-Capillary: 140 mg/dL — ABNORMAL HIGH (ref 70–99)
Glucose-Capillary: 118 mg/dL — ABNORMAL HIGH (ref 70–99)
Glucose-Capillary: 114 mg/dL — ABNORMAL HIGH (ref 70–99)

## 2023-01-02 MED ORDER — TORSEMIDE 20 MG PO TABS
40.0000 mg | ORAL_TABLET | Freq: Every day | ORAL | Status: DC
  Administered 2023-01-02 – 2023-01-04 (×3): 40 mg via ORAL
  Filled 2023-01-02 (×3): qty 2

## 2023-01-02 MED ORDER — FE FUM-VIT C-VIT B12-FA 460-60-0.01-1 MG PO CAPS
1.0000 | ORAL_CAPSULE | Freq: Every day | ORAL | Status: DC
  Administered 2023-01-02 – 2023-01-04 (×3): 1 via ORAL
  Filled 2023-01-02 (×3): qty 1

## 2023-01-02 MED ORDER — INSULIN ASPART 100 UNIT/ML IJ SOLN
0.0000 [IU] | Freq: Three times a day (TID) | INTRAMUSCULAR | Status: DC
  Administered 2023-01-02: 2 [IU] via SUBCUTANEOUS

## 2023-01-02 MED ORDER — SPIRONOLACTONE 25 MG PO TABS
25.0000 mg | ORAL_TABLET | Freq: Every day | ORAL | Status: DC
  Administered 2023-01-02 – 2023-01-04 (×3): 25 mg via ORAL
  Filled 2023-01-02 (×3): qty 1

## 2023-01-02 MED ORDER — POTASSIUM CHLORIDE CRYS ER 20 MEQ PO TBCR
20.0000 meq | EXTENDED_RELEASE_TABLET | Freq: Two times a day (BID) | ORAL | Status: AC
  Administered 2023-01-02 (×2): 20 meq via ORAL
  Filled 2023-01-02 (×2): qty 1

## 2023-01-02 NOTE — Evaluation (Signed)
Occupational Therapy Evaluation Patient Details Name: Jeremiah Dixon MRN: [id number] DOB: [birth year] Today's Date: 01/02/2023   History of Present Illness Hayyan Elgart is a 62 yo male admitted for elective CABGx3 on 12/2. PMH: CAD, morbid obesity, HTN, HLD,  Alcohol abuse, and Prediabetes.  Recent hospital stay on 12/06/22 for respiratory arrest.   Clinical Impression   PTA, pt recently back home and reports performing ADL independently; was challenged by his stairs to enter his home, but able to perform with increased time and rest breaks. Upon eval, pt with decreased strength, balance, activity tolerance, and knowledge of precautions. Pt needing up to mod A for UB ADL and max A for LB ADL. Able to self-initiate 4 standing rest breaks during hallway mobility with min cues. Due to significant change in functional status, recommending intensive multidisciplinary rehabilitation >3 hours/day to optimize safety and independence in ADL.         If plan is discharge home, recommend the following: A little help with walking and/or transfers;A little help with bathing/dressing/bathroom;Assistance with cooking/housework;Assist for transportation;Help with stairs or ramp for entrance;Supervision due to cognitive status    Functional Status Assessment  Patient has had a recent decline in their functional status and demonstrates the ability to make significant improvements in function in a reasonable and predictable amount of time.  Equipment Recommendations  Other (comment) (defer)    Recommendations for Other Services       Precautions / Restrictions Precautions Precautions: Fall;Sternal Precaution Booklet Issued: Yes (comment) Precaution Comments: pt with verbal understanding, likes using a regular pillow instead of heart pillow Restrictions Weight Bearing Restrictions: Yes (sternal precautions)      Mobility Bed Mobility Overal bed mobility: Needs Assistance Bed Mobility: Sit to  Sidelying, Rolling Rolling: Min assist       Sit to sidelying: Mod assist General bed mobility comments: max directional verbal cues initially; following commands well    Transfers Overall transfer level: Needs assistance Equipment used: None Transfers: Sit to/from Stand Sit to Stand: Min assist           General transfer comment: verbal cues for technique and min A for rise and steady      Balance Overall balance assessment: Needs assistance Sitting-balance support: No upper extremity supported, Feet supported Sitting balance-Leahy Scale: Good     Standing balance support: Bilateral upper extremity supported, During functional activity Standing balance-Leahy Scale: Fair Standing balance comment: EVA in standing                           ADL either performed or assessed with clinical judgement   ADL Overall ADL's : Needs assistance/impaired Eating/Feeding: Modified independent   Grooming: Minimal assistance;Standing   Upper Body Bathing: Moderate assistance;Sitting   Lower Body Bathing: Maximal assistance;Sit to/from stand   Upper Body Dressing : Sitting;Maximal assistance   Lower Body Dressing: Maximal assistance;Sit to/from Database administrator: Ambulation;Minimal assistance (EVA)           Functional mobility during ADLs: Minimal assistance;+2 for safety/equipment       Vision Baseline Vision/History: 1 Wears glasses Ability to See in Adequate Light: 0 Adequate Patient Visual Report: No change from baseline Vision Assessment?: No apparent visual deficits     Perception Perception: Within Functional Limits       Praxis Praxis: WFL       Pertinent Vitals/Pain Pain Assessment Pain Assessment: Faces Faces Pain Scale: Hurts little more Pain Location: sternal incision  Pain Descriptors / Indicators: Sore Pain Intervention(s): Limited activity within patient's tolerance, Monitored during session     Extremity/Trunk Assessment  Upper Extremity Assessment Upper Extremity Assessment: Generalized weakness   Lower Extremity Assessment Lower Extremity Assessment: Defer to PT evaluation   Cervical / Trunk Assessment Cervical / Trunk Assessment: Normal   Communication Communication Communication: No apparent difficulties   Cognition Arousal: Alert Behavior During Therapy: WFL for tasks assessed/performed, Anxious (min anxious) Overall Cognitive Status: Within Functional Limits for tasks assessed                                 General Comments: able to recall sternal precautions. Pt with good self pacing throughout session. WIll continue to assess     General Comments  Needing as much as 3L O2 via [place] during functional mobility in hall to maintain SpO2 >90    Exercises     Shoulder Instructions      Home Living Family/patient expects to be discharged to:: Private residence Living Arrangements: Alone Available Help at Discharge: Available PRN/intermittently (brother nearby) Type of Home: Apartment Home Access: Stairs to enter [employer] of Steps: 10 + landing + 10 -  bilateral rails on first flight and landing but on 2nd flight there is only 1 rail on R side Entrance Stairs-Rails: Right;Left Home Layout: Other (Comment) (lives on second floor of apartment building with flight of steps to his unit, has bilateral railings on first set of steps then railing on right for second set of steps)     Bathroom Shower/Tub: Tub/shower unit;Curtain   Firefighter: Standard Bathroom Accessibility: No   Home Equipment: None   Additional Comments: pt reports "My brother is finding me a place to get rehab and then stay. Like assisted living or something."  Lives With: Alone    Prior Functioning/Environment Prior Level of Function : Independent/Modified Independent (works at [employer])             Mobility Comments: pt with recent hospital stay follow by short CIR stay and then  returned home. Per PT with brother last tuesday 11/26 until monday 12/2 morning, but pt reports he was at his home PTA, unsure which is most accurate ADLs Comments: needed some assist post recent hospital stay        OT Problem List: Decreased strength;Decreased activity tolerance;Impaired balance (sitting and/or standing);Decreased cognition;Decreased safety awareness;Decreased knowledge of precautions;Decreased knowledge of use of DME or AE;Cardiopulmonary status limiting activity      OT Treatment/Interventions: Self-care/ADL training;Therapeutic exercise;DME and/or AE instruction;Patient/family education;Balance training;Therapeutic activities;Cognitive remediation/compensation    OT Goals(Current goals can be found in the care plan section) Acute Rehab OT Goals Patient Stated Goal: get better OT Goal Formulation: With patient Time For Goal Achievement: 01/16/23 Potential to Achieve Goals: Good  OT Frequency: Min 1X/week    Co-evaluation              AM-PAC OT "6 Clicks" Daily Activity     Outcome Measure Help from another person eating meals?: None Help from another person taking care of personal grooming?: A Little Help from another person toileting, which includes using toliet, bedpan, or urinal?: A Lot Help from another person bathing (including washing, rinsing, drying)?: A Lot Help from another person to put on and taking off regular upper body clothing?: A Lot Help from another person to put on and taking off regular lower body clothing?: A Lot 6 Click Score:  15   End of Session Equipment Utilized During Treatment: Gait belt;Other (comment) (EVA) Nurse Communication: Mobility status  Activity Tolerance: Patient tolerated treatment well Patient left: in bed;with call bell/phone within reach;with bed alarm set;with nursing/sitter in room  OT Visit Diagnosis: Unsteadiness on feet (R26.81);Muscle weakness (generalized) (M62.81);Other symptoms and signs involving  cognitive function;Other abnormalities of gait and mobility (R26.89)                Time: 0272-5366 OT Time Calculation (min): 28 min Charges:  OT General Charges $OT Visit: 1 Visit OT Evaluation $OT Eval Moderate Complexity: 1 Mod OT Treatments $Self Care/Home Management : 8-22 mins  Tyler Deis, OTR/L [facility] Acute Rehabilitation Office: [phone]   Myrla Halsted 01/02/2023, 8:52 AM

## 2023-01-02 NOTE — Progress Notes (Signed)
  Inpatient Rehabilitation Admissions Coordinator   Met with patient  at bedside for rehab assessment. We discussed goals and expectations of a possible CIR admit.  He was at [facility] for 5 days prior to discharge home to prepare for his surgery. He states he is not looking for ALF placement for they lack the care he would like. He plans to discharge home alone with brother able to check in periodically for groceries and to do laundry. He has 8 + 8 steps entry into his home. I will discuss case with Dr Riley Kill and follow up tomorrow to verify candidacy for CIR admit.  Please call me with any questions.   Ottie Glazier, RN, MSN Rehab Admissions Coordinator [phone]

## 2023-01-02 NOTE — Progress Notes (Signed)
2 Days Post-Op Procedure(s) (LRB): CORONARY ARTERY BYPASS GRAFTING X 3, USING LEFT INTERNAL MAMMARY ARTERY AND OPEN HARVESTED LEFT SAPHENOUS VEIN GRAFT, ATTEMPTED ENDOSCOPIC RIGHT SAPHENOUS VEIN HARVEST (N/A) CLIPPING OF ATRIAL APPENDAGE WITH PULMONARY VEIN ISOLATION (N/A) TRANSESOPHAGEAL ECHOCARDIOGRAM (N/A) Subjective: No complaints. Walked this am but nurse reports some dizziness and had to stop a few times to rest.  Sitting up, ate all of breakfast.  Objective: Vital signs in last 24 hours: Temp:  [97.9 F (36.6 C)-99.5 F (37.5 C)] 98.1 F (36.7 C) (12/03 2315) Pulse Rate:  [74-94] 76 (12/04 0600) Cardiac Rhythm: Normal sinus rhythm (12/03 2000) Resp:  [8-28] 23 (12/04 0630) BP: (93-127)/(41-88) 126/41 (12/04 0630) SpO2:  [95 %-100 %] 99 % (12/04 0600) Arterial Line BP: (96-139)/(53-71) 102/58 (12/03 1345) Weight:  [106.9 kg] 106.9 kg (12/04 0500)  Hemodynamic parameters for last 24 hours: PAP: (40)/(18) 40/18  Intake/Output from previous day: 12/03 0701 - 12/04 0700 In: 2186.8 [P.O.:1998; I.V.:188.8] Out: 2325 [Urine:1925; Chest Tube:400] Intake/Output this shift: No intake/output data recorded.  General appearance: alert and cooperative Neurologic: intact Heart: regular rate and rhythm Lungs: clear to auscultation bilaterally Extremities: edema mild Wound: dressings dry  Lab Results: Recent Labs    01/01/23 1648 01/02/23 0432  WBC 9.1 7.7  HGB 8.9* 8.0*  HCT 27.3* 24.7*  PLT 175 136*   BMET:  Recent Labs    01/01/23 1648 01/02/23 0432  NA 130* 127*  K 4.2 4.0  CL 99 98  CO2 22 24  GLUCOSE 178* 124*  BUN 9 9  CREATININE 0.66 0.80  CALCIUM 8.4* 8.1*    PT/INR:  Recent Labs    12/31/22 1450  LABPROT 16.4*  INR 1.3*   ABG    Component Value Date/Time   PHART 7.314 (L) 12/31/2022 1906   HCO3 23.3 12/31/2022 1906   TCO2 25 12/31/2022 1906   ACIDBASEDEF 3.0 (H) 12/31/2022 1906   O2SAT 99 12/31/2022 1906   CBG (last 3)  Recent Labs     01/01/23 2314 01/02/23 0319 01/02/23 0732  GLUCAP 105* 121* 188*   CXR: left basilar atelectasis   Assessment/Plan: S/P Procedure(s) (LRB): CORONARY ARTERY BYPASS GRAFTING X 3, USING LEFT INTERNAL MAMMARY ARTERY AND OPEN HARVESTED LEFT SAPHENOUS VEIN GRAFT, ATTEMPTED ENDOSCOPIC RIGHT SAPHENOUS VEIN HARVEST (N/A) CLIPPING OF ATRIAL APPENDAGE WITH PULMONARY VEIN ISOLATION (N/A) TRANSESOPHAGEAL ECHOCARDIOGRAM (N/A)  POD 2  Hemodynamically stable in sinus rhythm. Continue amio and dig for PAF. Hold off on beta blocker until BP comes up some more.  Weight not accurate today. Continue Demedex and spiro that he was on before.  DC pacing wires, chest tubes, sleeve and foley.  Glucose under good control with no hx of DM and normal Hgb A1c preop on no meds. DC Levemir and continue SSI today.  Continue IS, ambulation. PT, CIR eval.   LOS: 2 days    [name] 01/02/2023

## 2023-01-03 ENCOUNTER — Inpatient Hospital Stay (HOSPITAL_COMMUNITY): Payer: Managed Care, Other (non HMO)

## 2023-01-03 LAB — BASIC METABOLIC PANEL
Anion gap: 10 (ref 5–15)
BUN: 13 mg/dL (ref 8–23)
CO2: 26 mmol/L (ref 22–32)
Calcium: 8.5 mg/dL — ABNORMAL LOW (ref 8.9–10.3)
Chloride: 93 mmol/L — ABNORMAL LOW (ref 98–111)
Creatinine, Ser: 0.81 mg/dL (ref 0.61–1.24)
GFR, Estimated: >60 mL/min (ref >60)
Glucose, Bld: 119 mg/dL — ABNORMAL HIGH (ref 70–99)
Potassium: 3.8 mmol/L (ref 3.5–5.1)
Sodium: 129 mmol/L — ABNORMAL LOW (ref 135–145)

## 2023-01-03 LAB — CBC
HCT: 23.2 % — ABNORMAL LOW (ref 39.0–52.0)
Hemoglobin: 7.5 g/dL — ABNORMAL LOW (ref 13.0–17.0)
MCH: 30.7 pg (ref 26.0–34.0)
MCHC: 32.3 g/dL (ref 30.0–36.0)
MCV: 95.1 fL (ref 80.0–100.0)
Platelets: 146 10*3/uL — ABNORMAL LOW (ref 150–400)
RBC: 2.44 MIL/uL — ABNORMAL LOW (ref 4.22–5.81)
RDW: 13.3 % (ref 11.5–15.5)
WBC: 8.1 10*3/uL (ref 4.0–10.5)
nRBC: 0 % (ref 0.0–0.2)

## 2023-01-03 LAB — GLUCOSE, CAPILLARY
Glucose-Capillary: 116 mg/dL — ABNORMAL HIGH (ref 70–99)
Glucose-Capillary: 105 mg/dL — ABNORMAL HIGH (ref 70–99)
Glucose-Capillary: 107 mg/dL — ABNORMAL HIGH (ref 70–99)
Glucose-Capillary: 108 mg/dL — ABNORMAL HIGH (ref 70–99)

## 2023-01-03 MED ORDER — LACTULOSE 10 GM/15ML PO SOLN
20.0000 g | Freq: Once | ORAL | Status: AC
  Administered 2023-01-03: 20 g via ORAL
  Filled 2023-01-03: qty 30

## 2023-01-03 MED ORDER — METOCLOPRAMIDE HCL 5 MG PO TABS
10.0000 mg | ORAL_TABLET | Freq: Three times a day (TID) | ORAL | Status: DC
  Administered 2023-01-03 – 2023-01-04 (×5): 10 mg via ORAL
  Filled 2023-01-03: qty 2
  Filled 2023-01-03: qty 1
  Filled 2023-01-03: qty 2
  Filled 2023-01-03: qty 1
  Filled 2023-01-03: qty 2

## 2023-01-03 MED ORDER — POTASSIUM CHLORIDE CRYS ER 20 MEQ PO TBCR
20.0000 meq | EXTENDED_RELEASE_TABLET | Freq: Two times a day (BID) | ORAL | Status: AC
  Administered 2023-01-03 (×2): 20 meq via ORAL
  Filled 2023-01-03 (×3): qty 1

## 2023-01-03 MED ORDER — GUAIFENESIN ER 600 MG PO TB12
600.0000 mg | ORAL_TABLET | Freq: Two times a day (BID) | ORAL | Status: DC
  Administered 2023-01-03 – 2023-01-04 (×2): 600 mg via ORAL
  Filled 2023-01-03 (×2): qty 1

## 2023-01-03 NOTE — Progress Notes (Signed)
3 Days Post-Op Procedure(s) (LRB): CORONARY ARTERY BYPASS GRAFTING X 3, USING LEFT INTERNAL MAMMARY ARTERY AND OPEN HARVESTED LEFT SAPHENOUS VEIN GRAFT, ATTEMPTED ENDOSCOPIC RIGHT SAPHENOUS VEIN HARVEST (N/A) CLIPPING OF ATRIAL APPENDAGE WITH PULMONARY VEIN ISOLATION (N/A) TRANSESOPHAGEAL ECHOCARDIOGRAM (N/A) Subjective: Some nausea this am when getting up. Passing flatus but no BM yet.   Objective: Vital signs in last 24 hours: Temp:  [97.8 F (36.6 C)-98.6 F (37 C)] 97.8 F (36.6 C) (12/05 0400) Pulse Rate:  [74-86] 84 (12/05 0615) Cardiac Rhythm: Normal sinus rhythm (12/04 2000) Resp:  [11-29] 21 (12/05 0615) BP: (94-149)/(62-89) 119/74 (12/05 0615) SpO2:  [91 %-98 %] 93 % (12/05 0615) Weight:  [127.8 kg] 127.8 kg (12/05 0500)  Hemodynamic parameters for last 24 hours:    Intake/Output from previous day: 12/04 0701 - 12/05 0700 In: -  Out: 2960 [Urine:2910; Chest Tube:50] Intake/Output this shift: No intake/output data recorded.  General appearance: alert and cooperative Neurologic: intact Heart: regular rate and rhythm, S1, S2 normal, no murmur Lungs: clear to auscultation bilaterally Abdomen: soft, non-tender; bowel sounds normal Extremities: edema mild Wound: incisions ok  Lab Results: Recent Labs    01/02/23 0432 01/03/23 0243  WBC 7.7 8.1  HGB 8.0* 7.5*  HCT 24.7* 23.2*  PLT 136* 146*   BMET:  Recent Labs    01/02/23 0432 01/03/23 0243  NA 127* 129*  K 4.0 3.8  CL 98 93*  CO2 24 26  GLUCOSE 124* 119*  BUN 9 13  CREATININE 0.80 0.81  CALCIUM 8.1* 8.5*    PT/INR:  Recent Labs    12/31/22 1450  LABPROT 16.4*  INR 1.3*   ABG    Component Value Date/Time   PHART 7.314 (L) 12/31/2022 1906   HCO3 23.3 12/31/2022 1906   TCO2 25 12/31/2022 1906   ACIDBASEDEF 3.0 (H) 12/31/2022 1906   O2SAT 99 12/31/2022 1906   CBG (last 3)  Recent Labs    01/02/23 1558 01/02/23 2135 01/03/23 0644  GLUCAP 118* 114* 116*   CXR: mild interstitial  edema. Left base atelectasis.  Assessment/Plan: S/P Procedure(s) (LRB): CORONARY ARTERY BYPASS GRAFTING X 3, USING LEFT INTERNAL MAMMARY ARTERY AND OPEN HARVESTED LEFT SAPHENOUS VEIN GRAFT, ATTEMPTED ENDOSCOPIC RIGHT SAPHENOUS VEIN HARVEST (N/A) CLIPPING OF ATRIAL APPENDAGE WITH PULMONARY VEIN ISOLATION (N/A) TRANSESOPHAGEAL ECHOCARDIOGRAM (N/A)  POD 3 Hemodynamically stable in sinus rhythm. Continue amio and digoxin.  -3L yesterday and wt now 10-11 lbs over preop. Continue Demedex and spiro.  Glucose under good control.  Lactulose this am.  IS, ambulation. Hopefully to CIR when recovered.  Planning to resume Eliquis for PAF prior to discharge.  Expected postop anemia: on iron.  Will transfer to 4E.  LOS: 3 days    [name] 01/03/2023

## 2023-01-03 NOTE — H&P (Incomplete)
Physical Medicine and Rehabilitation Admission H&P   CC: Functional deficits secondary to cardiac debility  HPI: Jeremiah Dixon is a 62 year old male who is s/p CABG times 3 vessels, pulmonary vein isolation and LAA clipping by Dr. Laneta Simmers on 12/31/2022. He was recently admitted 11/07 secondary to PEA arrest, underwent cardiac catheterization and plan for CABG. Started long-term anti-coagulation for atrial fibrillation. He was discharged and admitted to CIR on 11/20. Discharge home on 11/26. He has done well post-operatively. Extubated, chest tubes removed and ambulating with PT. Diuresis continued. Constipation addressed. Plan resuming Eliquis at discharge. The patient requires inpatient medicine and rehabilitation evaluations and services for ongoing dysfunction secondary to cardiac disability.   ROS Past Medical History:  Diagnosis Date   Atrial fibrillation (HCC)    CHF (congestive heart failure) (HCC)    Coronary artery disease    Dysrhythmia    A. Fib   HLD (hyperlipidemia)    HTN (hypertension)    MI (myocardial infarction) [facility])    Past Surgical History:  Procedure Laterality Date   CARDIOVERSION N/A 12/14/2022   Procedure: CARDIOVERSION (CATH LAB);  Surgeon: Dolores Patty, MD;  Location: [facility] INVASIVE CV LAB;  Service: Cardiovascular;  Laterality: N/A;   CLIPPING OF ATRIAL APPENDAGE N/A 12/31/2022   Procedure: CLIPPING OF ATRIAL APPENDAGE WITH PULMONARY VEIN ISOLATION;  Surgeon: [name], MD;  Location: [facility] OR;  Service: Open Heart Surgery;  Laterality: N/A;   CORONARY ARTERY BYPASS GRAFT N/A 12/31/2022   Procedure: CORONARY ARTERY BYPASS GRAFTING X 3, USING LEFT INTERNAL MAMMARY ARTERY AND OPEN HARVESTED LEFT SAPHENOUS VEIN GRAFT, ATTEMPTED ENDOSCOPIC RIGHT SAPHENOUS VEIN HARVEST;  Surgeon: [name], MD;  Location: [facility] OR;  Service: Open Heart Surgery;  Laterality: N/A;   RIGHT HEART CATH N/A 12/06/2022   Procedure: RIGHT HEART CATH;  Surgeon: Laurey Morale, MD;   Location[facility] INVASIVE CV LAB;  Service: Cardiovascular;  Laterality: N/A;   RIGHT/LEFT HEART CATH AND CORONARY ANGIOGRAPHY N/A 12/10/2022   Procedure: RIGHT/LEFT HEART CATH AND CORONARY ANGIOGRAPHY;  Surgeon: Dolores Patty, MD;  Location: [facility] INVASIVE CV LAB;  Service: Cardiovascular;  Laterality: N/A;   TEE WITHOUT CARDIOVERSION N/A 12/31/2022   Procedure: TRANSESOPHAGEAL ECHOCARDIOGRAM;  Surgeon: [name], MD;  Location: [facility] OR;  Service: Open Heart Surgery;  Laterality: N/A;   TRANSESOPHAGEAL ECHOCARDIOGRAM (CATH LAB) N/A 12/14/2022   Procedure: TRANSESOPHAGEAL ECHOCARDIOGRAM;  Surgeon: Dolores Patty, MD;  Location: [facility] INVASIVE CV LAB;  Service: Cardiovascular;  Laterality: N/A;   History reviewed. No pertinent family history. Social History:  reports that he has quit smoking. His smoking use included cigarettes. He has never used smokeless tobacco. He reports that he does not currently use alcohol after a past usage of about 8.0 standard drinks of alcohol per week. He reports that he does not use drugs. Allergies:  Allergies  Allergen Reactions   Bee Venom Palpitations   Diclofenac Diarrhea   Amoxicillin Diarrhea and Nausea Only   Lisinopril Cough   Pantoprazole Nausea Only    Other Reaction(s): chest pain   Medications Prior to Admission  Medication Sig Dispense Refill   acetaminophen (TYLENOL) 325 MG tablet Take 1-2 tablets (325-650 mg total) by mouth every 4 (four) hours as needed for mild pain (pain score 1-3).     amiodarone (PACERONE) 200 MG tablet Take 1 tablet (200 mg total) by mouth 2 (two) times daily. 60 tablet 0   aspirin 81 MG chewable tablet Chew 1 tablet (81 mg  total) by mouth daily. 30 tablet 0   digoxin (LANOXIN) 0.125 MG tablet Take 1 tablet (0.125 mg total) by mouth daily. 30 tablet 0   esomeprazole (NEXIUM) 40 MG capsule Take 1 capsule (40 mg total) by mouth 2 (two) times daily before a meal. 60 capsule 5   guaiFENesin (MUCINEX) 600 MG 12 hr tablet  Take 1 tablet (600 mg total) by mouth 2 (two) times daily as needed 30 tablet 0   guaiFENesin (MUCINEX) 600 MG 12 hr tablet Take 1 tablet (600 mg total) by mouth 2 (two) times daily as needed. 30 tablet 0   metoprolol succinate (TOPROL-XL) 50 MG 24 hr tablet Take 1 tablet (50 mg total) by mouth daily. Take with or immediately following a meal. 30 tablet 0   nitroGLYCERIN (NITROSTAT) 0.4 MG SL tablet Place 0.4 mg under the tongue every 5 (five) minutes as needed.     rosuvastatin (CRESTOR) 20 MG tablet Take 1 tablet (20 mg total) by mouth daily. 30 tablet 0   spironolactone (ALDACTONE) 25 MG tablet Take 1 tablet (25 mg total) by mouth daily. 30 tablet 0   torsemide (DEMADEX) 20 MG tablet Take 2 tablets (40 mg total) by mouth daily. 30 tablet 0   traZODone (DESYREL) 100 MG tablet Take 1 tablet (100 mg total) by mouth at bedtime as needed for sleep. 30 tablet 0   enoxaparin (LOVENOX) 120 MG/0.8ML injection Inject 0.8 mLs (120 mg total) into the skin every 12 (twelve) hours for 10 doses. 8 mL 0   psyllium (HYDROCIL/METAMUCIL) 95 % PACK Take 1 packet by mouth daily.        Home: Home Living Family/patient expects to be discharged to:: Private residence Living Arrangements: Alone Available Help at Discharge: Available PRN/intermittently (brother nearby) Type of Home: Apartment Home Access: Stairs to enter [employer] of Steps: 10 + landing + 10 -  bilateral rails on first flight and landing but on 2nd flight there is only 1 rail on R side Entrance Stairs-Rails: Right, Left Home Layout: Other (Comment) (lives on second floor of apartment building with flight of steps to his unit, has bilateral railings on first set of steps then railing on right for second set of steps) Bathroom Shower/Tub: Tub/shower unit, Engineer, building services: Standard Bathroom Accessibility: No Home Equipment: None Additional Comments: pt reports "My brother is finding me a place to get rehab and then stay. Like  assisted living or something."  Lives With: Alone   Functional History: Prior Function Prior Level of Function : Independent/Modified Independent (works at [employer]) Mobility Comments: pt with recent hospital stay follow by short CIR stay and then returned home. Per PT with brother last tuesday 11/26 until monday 12/2 morning, but pt reports he was at his home PTA, unsure which is most accurate ADLs Comments: needed some assist post recent hospital stay  Functional Status:  Mobility: Bed Mobility Overal bed mobility: Needs Assistance Bed Mobility: Sit to Sidelying, Rolling Rolling: Min assist Sidelying to sit: Min assist Sit to sidelying: Mod assist General bed mobility comments: pt received in chair Transfers Overall transfer level: Needs assistance Equipment used: None Transfers: Sit to/from Stand Sit to Stand: Min assist General transfer comment: min A for maintaining fwd shift  to remain standing Ambulation/Gait Ambulation/Gait assistance: Min assist Gait Distance (Feet): 40 Feet Assistive device: Standup Rollator Gait Pattern/deviations: Step-through pattern, Decreased stride length General Gait Details: pt not feeling well with ambulation, increased nausea, pale skin, ambulated 20' and then instructed to turn  back to room due to symptoms. Pt had ambulated >200' earlier this AM when feeling better Gait velocity: decreased Gait velocity interpretation: 1.31 - 2.62 ft/sec, indicative of limited community ambulator    ADL: ADL Overall ADL's : Needs assistance/impaired Eating/Feeding: Modified independent Grooming: Minimal assistance, Standing Upper Body Bathing: Moderate assistance, Sitting Lower Body Bathing: Maximal assistance, Sit to/from stand Upper Body Dressing : Sitting, Maximal assistance Lower Body Dressing: Maximal assistance, Sit to/from stand Toilet Transfer: Ambulation, Minimal assistance (EVA) Functional mobility during ADLs: Minimal assistance, +2 for  safety/equipment  Cognition: Cognition Overall Cognitive Status: Impaired/Different from baseline Orientation Level: Oriented X4 Cognition Arousal: Alert Behavior During Therapy: WFL for tasks assessed/performed Overall Cognitive Status: Impaired/Different from baseline Area of Impairment: Safety/judgement, Memory, Awareness, Problem solving, Following commands Current Attention Level: Selective Memory: Decreased short-term memory Following Commands: Follows one step commands consistently, Follows multi-step commands inconsistently Safety/Judgement: Decreased awareness of deficits Awareness: Emergent Problem Solving: Slow processing General Comments: pt distracted by nausea and by inability to have bowel mvmt but able to selectively attend to mobility most of the time  Physical Exam: Blood pressure 105/84, pulse 87, temperature 98.1 F (36.7 C), temperature source Oral, resp. rate 16, height 6\' 2"  (1.88 m), weight 127.8 kg, SpO2 97%. Physical Exam  Results for orders placed or performed during the hospital encounter of 12/31/22 (from the past 48 hour(s))  Glucose, capillary     Status: Abnormal   Collection Time: 01/01/23  4:17 PM  Result Value Ref Range   Glucose-Capillary 149 (H) 70 - 99 mg/dL    Comment: Glucose reference range applies only to samples taken after fasting for at least 8 hours.  Basic metabolic panel     Status: Abnormal   Collection Time: 01/01/23  4:48 PM  Result Value Ref Range   Sodium 130 (L) 135 - 145 mmol/L   Potassium 4.2 3.5 - 5.1 mmol/L   Chloride 99 98 - 111 mmol/L   CO2 22 22 - 32 mmol/L   Glucose, Bld 178 (H) 70 - 99 mg/dL    Comment: Glucose reference range applies only to samples taken after fasting for at least 8 hours.   BUN 9 8 - 23 mg/dL   Creatinine, Ser 0.27 0.61 - 1.24 mg/dL   Calcium 8.4 (L) 8.9 - 10.3 mg/dL   GFR, Estimated >25 >36 mL/min    Comment: (NOTE) Calculated using the CKD-EPI Creatinine Equation (2021)    Anion gap 9 5  - 15    Comment: Performed at [facility], [address] [place], [place] [address]  Magnesium     Status: None   Collection Time: 01/01/23  4:48 PM  Result Value Ref Range   Magnesium 2.2 1.7 - 2.4 mg/dL    Comment: Performed at [facility], [address] [place], [place] [address]  CBC     Status: Abnormal   Collection Time: 01/01/23  4:48 PM  Result Value Ref Range   WBC 9.1 4.0 - 10.5 K/uL   RBC 2.77 (L) 4.22 - 5.81 MIL/uL   Hemoglobin 8.9 (L) 13.0 - 17.0 g/dL   HCT 95.6 (L) 38.7 - 56.4 %   MCV 98.6 80.0 - 100.0 fL   MCH 32.1 26.0 - 34.0 pg   MCHC 32.6 30.0 - 36.0 g/dL   RDW 33.2 95.1 - 88.4 %   Platelets 175 150 - 400 K/uL   nRBC 0.0 0.0 - 0.2 %    Comment: Performed at [facility], [address], [place], [place] [address]  Glucose, capillary     Status: Abnormal   Collection Time: 01/01/23  7:53 PM  Result Value Ref Range   Glucose-Capillary 118 (H) 70 - 99 mg/dL    Comment: Glucose reference range applies only to samples taken after fasting for at least 8 hours.  Glucose, capillary     Status: Abnormal   Collection Time: 01/01/23 11:14 PM  Result Value Ref Range   Glucose-Capillary 105 (H) 70 - 99 mg/dL    Comment: Glucose reference range applies only to samples taken after fasting for at least 8 hours.  Glucose, capillary     Status: Abnormal   Collection Time: 01/02/23  3:19 AM  Result Value Ref Range   Glucose-Capillary 121 (H) 70 - 99 mg/dL    Comment: Glucose reference range applies only to samples taken after fasting for at least 8 hours.  Basic metabolic panel     Status: Abnormal   Collection Time: 01/02/23  4:32 AM  Result Value Ref Range   Sodium 127 (L) 135 - 145 mmol/L   Potassium 4.0 3.5 - 5.1 mmol/L   Chloride 98 98 - 111 mmol/L   CO2 24 22 - 32 mmol/L   Glucose, Bld 124 (H) 70 - 99 mg/dL    Comment: Glucose reference range applies only to samples taken after fasting for at least 8 hours.   BUN 9 8 - 23 mg/dL   Creatinine,  Ser 1.91 0.61 - 1.24 mg/dL   Calcium 8.1 (L) 8.9 - 10.3 mg/dL   GFR, Estimated >47 >82 mL/min    Comment: (NOTE) Calculated using the CKD-EPI Creatinine Equation (2021)    Anion gap 5 5 - 15    Comment: Performed at [facility], [address], [place], [place] [address]  CBC     Status: Abnormal   Collection Time: 01/02/23  4:32 AM  Result Value Ref Range   WBC 7.7 4.0 - 10.5 K/uL   RBC 2.52 (L) 4.22 - 5.81 MIL/uL   Hemoglobin 8.0 (L) 13.0 - 17.0 g/dL   HCT 30.8 (L) 65.7 - 84.6 %   MCV 98.0 80.0 - 100.0 fL   MCH 31.7 26.0 - 34.0 pg   MCHC 32.4 30.0 - 36.0 g/dL   RDW 96.2 95.2 - 84.1 %   Platelets 136 (L) 150 - 400 K/uL    Comment: REPEATED TO VERIFY   nRBC 0.0 0.0 - 0.2 %    Comment: Performed at [facility], [address], [place], [place] [address]  Glucose, capillary     Status: Abnormal   Collection Time: 01/02/23  7:32 AM  Result Value Ref Range   Glucose-Capillary 188 (H) 70 - 99 mg/dL    Comment: Glucose reference range applies only to samples taken after fasting for at least 8 hours.  Glucose, capillary     Status: Abnormal   Collection Time: 01/02/23 11:18 AM  Result Value Ref Range   Glucose-Capillary 140 (H) 70 - 99 mg/dL    Comment: Glucose reference range applies only to samples taken after fasting for at least 8 hours.  Glucose, capillary     Status: Abnormal   Collection Time: 01/02/23  3:58 PM  Result Value Ref Range   Glucose-Capillary 118 (H) 70 - 99 mg/dL    Comment: Glucose reference range applies only to samples taken after fasting for at least 8 hours.  Glucose, capillary  Status: Abnormal   Collection Time: 01/02/23  9:35 PM  Result Value Ref Range   Glucose-Capillary 114 (H) 70 - 99 mg/dL    Comment: Glucose reference range applies only to samples taken after fasting for at least 8 hours.  Basic metabolic panel     Status: Abnormal   Collection Time: 01/03/23  2:43 AM  Result Value Ref Range   Sodium 129 (L) 135 - 145 mmol/L    Potassium 3.8 3.5 - 5.1 mmol/L    Comment: HEMOLYSIS AT THIS LEVEL MAY AFFECT RESULT   Chloride 93 (L) 98 - 111 mmol/L   CO2 26 22 - 32 mmol/L   Glucose, Bld 119 (H) 70 - 99 mg/dL    Comment: Glucose reference range applies only to samples taken after fasting for at least 8 hours.   BUN 13 8 - 23 mg/dL   Creatinine, Ser 1.61 0.61 - 1.24 mg/dL   Calcium 8.5 (L) 8.9 - 10.3 mg/dL   GFR, Estimated >09 >60 mL/min    Comment: (NOTE) Calculated using the CKD-EPI Creatinine Equation (2021)    Anion gap 10 5 - 15    Comment: Performed at [facility], [address] [place], [place] [address]  CBC     Status: Abnormal   Collection Time: 01/03/23  2:43 AM  Result Value Ref Range   WBC 8.1 4.0 - 10.5 K/uL   RBC 2.44 (L) 4.22 - 5.81 MIL/uL   Hemoglobin 7.5 (L) 13.0 - 17.0 g/dL   HCT 81.1 (L) 91.4 - 78.2 %   MCV 95.1 80.0 - 100.0 fL   MCH 30.7 26.0 - 34.0 pg   MCHC 32.3 30.0 - 36.0 g/dL   RDW 95.6 21.3 - 08.6 %   Platelets 146 (L) 150 - 400 K/uL   nRBC 0.0 0.0 - 0.2 %    Comment: Performed at [facility], [address], [place], [place] [address]  Glucose, capillary     Status: Abnormal   Collection Time: 01/03/23  6:44 AM  Result Value Ref Range   Glucose-Capillary 116 (H) 70 - 99 mg/dL    Comment: Glucose reference range applies only to samples taken after fasting for at least 8 hours.  Glucose, capillary     Status: Abnormal   Collection Time: 01/03/23 11:29 AM  Result Value Ref Range   Glucose-Capillary 105 (H) 70 - 99 mg/dL    Comment: Glucose reference range applies only to samples taken after fasting for at least 8 hours.   DG CHEST PORT 1 VIEW  Result Date: 01/03/2023 CLINICAL DATA:  Status post CABG . EXAM: PORTABLE CHEST 1 VIEW COMPARISON:  01/02/2023 FINDINGS: Left lung base is not been included on the film. Left chest tube seen previously has probably been removed in the interval. Mediastinal/pericardial drain seen at the midline on the prior study is not  evident today. No evidence for pneumothorax interstitial markings remain coarsened with probable atelectasis at the left base. Potential small left effusion. The cardio pericardial silhouette is enlarged. Bones are diffusely demineralized. Telemetry leads overlie the chest. IMPRESSION: 1. Left lung base has not been included on the film. 2. Interval removal of left chest tube and mediastinal/pericardial drain. No evidence for pneumothorax. 3. Persistent coarsening of interstitial markings with probable atelectasis at the left base and potential small left effusion. Electronically Signed   By: Kennith Center M.D.   On: 01/03/2023 10:47   DG Chest Port 1 View  Result Date: 01/02/2023  CLINICAL DATA:  Status post coronary artery bypass graft. EXAM: PORTABLE CHEST 1 VIEW COMPARISON:  January 01, 2023. FINDINGS: Stable cardiomegaly. No definite pneumothorax is noted. Minimal to mild bibasilar subsegmental atelectasis is noted with probable small pleural effusions. Bony thorax is unremarkable. Left-sided chest tube is unchanged. Swan-Ganz catheter has been removed. IMPRESSION: Stable left-sided chest tube without pneumothorax. Minimal to mild bibasilar subsegmental atelectasis with probable small pleural effusions. Electronically Signed   By: Lupita Raider M.D.   On: 01/02/2023 10:22      Blood pressure 105/84, pulse 87, temperature 98.1 F (36.7 C), temperature source Oral, resp. rate 16, height 6\' 2"  (1.88 m), weight 127.8 kg, SpO2 97%.  Medical Problem List and Plan: 1. Functional deficits secondary to ***  -patient may *** shower  -ELOS/Goals: ***  2.  Antithrombotics: -DVT/anticoagulation:  Pharmaceutical: Eliquis  -antiplatelet therapy: Aspirin 81 mg daily  3. Pain Management: Tylenol, tramadol as needed  4. Mood/Behavior/Sleep: LCSW to evaluate and provide emotional support  -trazodone 100 mg q HS prn  -antipsychotic agents: n/a  5. Neuropsych/cognition: This patient *** capable of making  decisions on *** own behalf.  6. Skin/Wound Care: Routine skin care checks   7. Fluids/Electrolytes/Nutrition: Strict Is and Os and follow-up chemistries  8: Hypertension: monitor TID and prn (see #10 meds below)   9: Hyperlipidemia: continue statin  10: Acute systolic CHF (cardiogenic shock>>resolved)             -daily weight             -continue digoxin 0.125 mg daily             -continue spironolactone 25 mg daily                         -continue torsemide 40 mg daily             -continue amiodarone 200 mg BID   11: CAD:             -s/p CABG 12/02             -follow-up with Dr. Laneta Simmers             -continue asa 325 mg daily   12: Atrial fibrillation:             -on Eliquis 5 mg BID             -continue digoxin 0.125 mg daily             -continue amiodarone 200 mg BID   13: Obesity: BMI = 36.17   14: ABLA: continue Trigels-F Forte daily  -follow-up CBC   15: Hyponatremia: trending up slowly -follow-up BMP   16: Thrombocytopenia: improving, no bleeding; follow-up CBC  17: Hypokalemia: Klor-Con 20 mEq BID  18: Nausea: Reglan 10 mg TID  19: Constipation: continue Colace 200 mg daily, Dulcolax tab daily; prn meds ordered  20: GERD?: Protonix 80 mg  (home on Nexium 40 mg BID)  21: Cough?: on Mucinex 600 mg BID  ***  Milinda Antis, PA-C 01/03/2023

## 2023-01-03 NOTE — Progress Notes (Signed)
Physical Therapy Treatment Patient Details Name: Jeremiah Dixon MRN: [id number] DOB: 1960/06/18 Today's Date: 01/03/2023   History of Present Illness Jeremiah Dixon is a 62 yo male admitted for elective CABGx3 on 12/2. PMH: CAD, morbid obesity, HTN, HLD,  Alcohol abuse, and Prediabetes.  Recent hospital stay on 12/06/22 for respiratory arrest.    PT Comments  Pt nauseous this AM, reports he has not had a BM in several days and is concerned about this. Pt ambulated 20' with upright rollator and felt dizzy and more nauseous and instructed to turn back to room. Pt with some gagging and getting up clear fluid. BP before ambulation 107/70, 103/70 after ambulation. SPO2 98% on RA. HR in 80's. Reviewed sternal precautions  and LE there ex. Pt reports soreness L tricep and noted to have bruise above elbow. Felt better after ROM. PT will continue to follow.     If plan is discharge home, recommend the following: Assistance with cooking/housework;Assist for transportation;A little help with walking and/or transfers;Help with stairs or ramp for entrance;A little help with bathing/dressing/bathroom   Can travel by private vehicle        Equipment Recommendations  Rolling walker (2 wheels)    Recommendations for Other Services Rehab consult     Precautions / Restrictions Precautions Precautions: Fall;Sternal Precaution Comments: review of sternal precautions Restrictions Weight Bearing Restrictions: Yes (Sternal) RUE Weight Bearing: Non weight bearing LUE Weight Bearing: Non weight bearing     Mobility  Bed Mobility               General bed mobility comments: pt received in chair    Transfers Overall transfer level: Needs assistance Equipment used: None Transfers: Sit to/from Stand Sit to Stand: Min assist           General transfer comment: min A for maintaining fwd shift  to remain standing    Ambulation/Gait Ambulation/Gait assistance: Min assist Gait Distance  (Feet): 40 Feet Assistive device: Standup Rollator Gait Pattern/deviations: Step-through pattern, Decreased stride length Gait velocity: decreased Gait velocity interpretation: 1.31 - 2.62 ft/sec, indicative of limited community ambulator   General Gait Details: pt not feeling well with ambulation, increased nausea, pale skin, ambulated 20' and then instructed to turn back to room due to symptoms. Pt had ambulated >200' earlier this AM when feeling better   Stairs             Wheelchair Mobility     Tilt Bed    Modified Rankin (Stroke Patients Only)       Balance Overall balance assessment: Needs assistance Sitting-balance support: No upper extremity supported, Feet supported Sitting balance-Leahy Scale: Good     Standing balance support: Bilateral upper extremity supported, During functional activity Standing balance-Leahy Scale: Fair Standing balance comment: needs UE support                            Cognition Arousal: Alert Behavior During Therapy: WFL for tasks assessed/performed Overall Cognitive Status: Impaired/Different from baseline Area of Impairment: Safety/judgement, Memory, Awareness, Problem solving, Following commands                   Current Attention Level: Selective Memory: Decreased short-term memory Following Commands: Follows one step commands consistently, Follows multi-step commands inconsistently Safety/Judgement: Decreased awareness of deficits Awareness: Emergent Problem Solving: Slow processing General Comments: pt distracted by nausea and by inability to have bowel mvmt but able to selectively attend to mobility  most of the time        Exercises General Exercises - Lower Extremity Ankle Circles/Pumps: AROM, Both, 10 reps, Seated Quad Sets: AROM, Both, 10 reps, Seated Heel Slides:  (instructed to perform LLE in bed)    General Comments General comments (skin integrity, edema, etc.): BP 107/70 before  ambulation, 103/70 after ambulation. SPO2 98% on RA. HR in 80's. RR mid 20's. Pt relays feeling dizzy with ambulation as well as nauseous. Given alcohol wipe      Pertinent Vitals/Pain Pain Assessment Pain Assessment: Faces Faces Pain Scale: Hurts little more Pain Location: L tricep, noted bruise above elbow Pain Descriptors / Indicators: Sore Pain Intervention(s): Monitored during session, Repositioned    Home Living                          Prior Function            PT Goals (current goals can now be found in the care plan section) Acute Rehab PT Goals Patient Stated Goal: Independent, back to work. PT Goal Formulation: With patient Time For Goal Achievement: 01/15/23 Potential to Achieve Goals: Good Progress towards PT goals: Progressing toward goals    Frequency    Min 1X/week      PT Plan      Co-evaluation              AM-PAC PT "6 Clicks" Mobility   Outcome Measure  Help needed turning from your back to your side while in a flat bed without using bedrails?: A Lot Help needed moving from lying on your back to sitting on the side of a flat bed without using bedrails?: A Lot Help needed moving to and from a bed to a chair (including a wheelchair)?: A Little Help needed standing up from a chair using your arms (e.g., wheelchair or bedside chair)?: A Little Help needed to walk in hospital room?: A Little Help needed climbing 3-5 steps with a railing? : A Lot 6 Click Score: 15    End of Session Equipment Utilized During Treatment: Gait belt Activity Tolerance: Treatment limited secondary to medical complications (Comment) (limited by nausea) Patient left: with call bell/phone within reach;in chair Nurse Communication: Mobility status PT Visit Diagnosis: Other abnormalities of gait and mobility (R26.89);Muscle weakness (generalized) (M62.81)     Time: 4696-2952 PT Time Calculation (min) (ACUTE ONLY): 19 min  Charges:    $Gait Training:  8-22 mins PT General Charges $$ ACUTE PT VISIT: 1 Visit                     Lyanne Co, PT  Acute Rehab Services Secure chat preferred Office [phone]    Lawana Chambers Jameria Bradway 01/03/2023, 9:18 AM

## 2023-01-03 NOTE — Progress Notes (Signed)
Inpatient Rehabilitation Admissions Coordinator   I have received insurance approval for Cir and patient made aware. I am hopeful for surgical clearance to admit Friday. I will follow up in the am.  Ottie Glazier, RN, MSN Rehab Admissions Coordinator [phone] 01/03/2023 4:25 PM

## 2023-01-03 NOTE — Progress Notes (Addendum)
  Inpatient Rehabilitation Admissions Coordinator   Case discussed with Dr Berline Chough, Rehab MD. Patient's brother and sister in law to arrive this am and I will meet with pt and family to discuss rehab venue goals and options moving forward. Patient to call me upon their arrival today.  Ottie Glazier, RN, MSN Rehab Admissions Coordinator [phone] 01/03/2023 10:44 AM  I met with patient , brother and sister in law at bedside. I disused brief CIR admit before returning home. They are not seeking placement after CIR, were looking for alternatives to CIR if not approved. I will begin Auth with [employer].  Ottie Glazier, RN, MSN Rehab Admissions Coordinator [phone] 01/03/2023 11:22 AM

## 2023-01-03 NOTE — PMR Pre-admission (Signed)
PMR Admission Coordinator Pre-Admission Assessment  Patient: Jeremiah Dixon is an 62 y.o., male MRN: [id number] DOB: 07-05-1960 Height: 6\' 3"  (190.5 cm) Weight: 127.1 kg  Insurance Information HMO: yes    PPO:      PCP:      IPA:      80/20:      OTHER:  PRIMARY: [employer]      Policy#: [id number]      Subscriber: pt CM Name: Tamala Bari      Phone#: [phone]     Fax#: [phone] Pre-Cert#: [id number] approved 12/6 until 12/12 with update due 12/13 f/u with Shelah Lewandowsky phone [phone] fax [phone]      Employer: Benefits:  Phone #: [phone]     Name: 12/5 Eff. Date: 06/04/22 until 12/31 24     Deduct: $4750      Out of Pocket Max: $7000      Life Max: none CIR: After deductible, 70%      SNF: 70% Outpatient: 70%     Co-Pay: 30% Home Health: 70%      Co-Pay: 30% 60 visits combined DME: 70%     Co-Pay: 30% Providers: in network  [employer] for [facility] and DME phone [phone] fax [phone]. Portal [email or web address]  SECONDARY: none  Financial Counselor:       Phone#:   The Engineer, materials Information Summary" for patients in Inpatient Rehabilitation Facilities with attached "Privacy Act Statement-Health Care Records" was provided and verbally reviewed with: N/A  Emergency Contact Information Contact Information     Name Relation Home Work Mobile   Dancy,Bill Brother   [phone]   Lynch,Erica Niece   [phone]      Other Contacts   None on File    Current Medical History  Patient Admitting Diagnosis: CABG, debility  History of Present Illness:  62 year old male with history of CAD, morbid obesity, HTN, HLD, alcohol abuse ( 1/2 gallon per day) and prediabetes.  [facility] for chest pain and SOB. Developed into respiratory arrest. Developed Afib with RVR, cardiogenic shock and acute systolic CHF. Cardioverted on 11/15. Cardiac catherization revealed severe three vessel CAD. CVTS  consulted with planned elective CABG 12/2. Underwent left thoracentesis for left pleural effusion. Admitted to CIR 11/21 until 11/26 and discharged home at independent level.  Presented on 12/31/22 for planned CABG X 3, pulmonary vein isolation ablation and clipping of left atrial appendage.Marland Kitchen Postoperative diuresing , removal of chest tubes, pacing wires. Constipation noted on 12/4 and nausea. Relieved with B< on 12/5. To resume Eliquis for PAF prior to discharge.  Patient's medical record from [facility] has been reviewed by the rehabilitation admission coordinator and physician.  Past Medical History  Past Medical History:  Diagnosis Date   Atrial fibrillation (HCC)    CHF (congestive heart failure) (HCC)    Coronary artery disease    Dysrhythmia    A. Fib   HLD (hyperlipidemia)    HTN (hypertension)    MI (myocardial infarction) (HCC)    Has the patient had major surgery during 100 days prior to admission? Yes  Family History   family history is not on file.  Current Medications  Current Facility-Administered Medications:    acetaminophen (TYLENOL) tablet 1,000 mg, 1,000 mg, Oral, Q6H, 1,000 mg at 01/04/23 0620 **OR** acetaminophen (TYLENOL) 160 MG/5ML solution 1,000 mg, 1,000 mg, Per Tube, Q6H, Barrett, Erin R, PA-C   amiodarone (PACERONE) tablet 200 mg, 200 mg,  Oral, BID, Barrett, Erin R, PA-C, 200 mg at 01/04/23 0830   apixaban (ELIQUIS) tablet 5 mg, 5 mg, Oral, BID, Silvana Newness, RPH, 5 mg at 01/04/23 0830   aspirin EC tablet 81 mg, 81 mg, Oral, Daily, 81 mg at 01/04/23 0830 **OR** aspirin chewable tablet 81 mg, 81 mg, Per Tube, Daily, Barrett, Erin R, PA-C   bisacodyl (DULCOLAX) EC tablet 10 mg, 10 mg, Oral, Daily, 10 mg at 01/03/23 1610 **OR** bisacodyl (DULCOLAX) suppository 10 mg, 10 mg, Rectal, Daily, Barrett, Erin R, PA-C   Chlorhexidine Gluconate Cloth 2 % PADS 6 each, 6 each, Topical, Daily, [name], MD, 6 each at 01/04/23 0834   digoxin (LANOXIN)  tablet 0.125 mg, 0.125 mg, Oral, Daily, Barrett, Erin R, PA-C, 0.125 mg at 01/04/23 0830   docusate sodium (COLACE) capsule 200 mg, 200 mg, Oral, Daily, Barrett, Erin R, PA-C, 200 mg at 01/04/23 0830   Fe Fum-Vit C-Vit B12-FA (TRIGELS-F FORTE) capsule 1 capsule, 1 capsule, Oral, Daily, Bartle, Payton Doughty, MD, 1 capsule at 01/04/23 0830   guaiFENesin (MUCINEX) 12 hr tablet 600 mg, 600 mg, Oral, BID, [name], MD, 600 mg at 01/04/23 0831   CBG monitoring, , , 4x Daily, AC & HS **AND** insulin aspart (novoLOG) injection 0-24 Units, 0-24 Units, Subcutaneous, TID WC, [name], MD, 2 Units at 01/02/23 1138   metoCLOPramide (REGLAN) tablet 10 mg, 10 mg, Oral, TID AC, [name], MD, 10 mg at 01/04/23 0829   metoprolol succinate (TOPROL-XL) 24 hr tablet 25 mg, 25 mg, Oral, Daily, Barrett, Erin R, PA-C, 25 mg at 01/04/23 0830   morphine (PF) 2 MG/ML injection 1-4 mg, 1-4 mg, Intravenous, Q1H PRN, Barrett, Erin R, PA-C, 2 mg at 01/02/23 0950   ondansetron (ZOFRAN) injection 4 mg, 4 mg, Intravenous, Q6H PRN, Barrett, Erin R, PA-C, 4 mg at 01/03/23 0545   Oral care mouth rinse, 15 mL, Mouth Rinse, PRN, [name], MD   oxyCODONE (Oxy IR/ROXICODONE) immediate release tablet 5-10 mg, 5-10 mg, Oral, Q3H PRN, Barrett, Erin R, PA-C, 10 mg at 01/02/23 0949   pantoprazole (PROTONIX) EC tablet 80 mg, 80 mg, Oral, Q1200, Barrett, Erin R, PA-C, 80 mg at 01/03/23 1304   rosuvastatin (CRESTOR) tablet 20 mg, 20 mg, Oral, Daily, Barrett, Erin R, PA-C, 20 mg at 01/04/23 0831   sodium chloride flush (NS) 0.9 % injection 3 mL, 3 mL, Intravenous, Q12H, Barrett, Erin R, PA-C, 3 mL at 01/04/23 0834   sodium chloride flush (NS) 0.9 % injection 3 mL, 3 mL, Intravenous, PRN, Barrett, Erin R, PA-C   spironolactone (ALDACTONE) tablet 25 mg, 25 mg, Oral, Daily, Bartle, Bryan K, MD, 25 mg at 01/04/23 0830   torsemide (DEMADEX) tablet 40 mg, 40 mg, Oral, Daily, [name], MD, 40 mg at 01/04/23 0831   traMADol  (ULTRAM) tablet 50-100 mg, 50-100 mg, Oral, Q4H PRN, Barrett, Erin R, PA-C, 50 mg at 01/02/23 1145   traZODone (DESYREL) tablet 100 mg, 100 mg, Oral, QHS PRN, Barrett, Erin R, PA-C, 100 mg at 01/03/23 2241  Patients Current Diet:  Diet Order             Diet Carb Modified Fluid consistency: Thin; Room service appropriate? Yes  Diet effective now                  Precautions / Restrictions Precautions Precautions: Fall, Sternal Precaution Booklet Issued: Yes (comment) Precaution Comments: review of sternal precautions Restrictions Weight  Bearing Restrictions: Yes (sternal prec) RUE Weight Bearing: Non weight bearing LUE Weight Bearing: Non weight bearing   Has the patient had 2 or more falls or a fall with injury in the past year? No  Prior Activity Level Community (5-7x/wk): prior to last admit indepnedent, working  and driving  Prior Functional Level Self Care: Did the patient need help bathing, dressing, using the toilet or eating? Independent  Indoor Mobility: Did the patient need assistance with walking from room to room (with or without device)? Independent  Stairs: Did the patient need assistance with internal or external stairs (with or without device)? Independent  Functional Cognition: Did the patient need help planning regular tasks such as shopping or remembering to take medications? Independent  Patient Information Are you of Hispanic, Latino/a,or Spanish origin?: A. No, not of Hispanic, Latino/a, or Spanish origin What is your race?: A. White Do you need or want an interpreter to communicate with a doctor or health care staff?: 0. No  Patient's Response To:  Health Literacy and Transportation Is the patient able to respond to health literacy and transportation needs?: Yes Health Literacy - How often do you need to have someone help you when you read instructions, pamphlets, or other written material from your doctor or pharmacy?: Never In the past 12  months, has lack of transportation kept you from medical appointments or from getting medications?: No In the past 12 months, has lack of transportation kept you from meetings, work, or from getting things needed for daily living?: No  Home Assistive Devices / Equipment Home Equipment: None  Prior Device Use: Indicate devices/aids used by the patient prior to current illness, exacerbation or injury? None of the above  Current Functional Level Cognition  Arousal/Alertness: Awake/alert Overall Cognitive Status: Impaired/Different from baseline Current Attention Level: Selective Orientation Level: Oriented X4 Following Commands: Follows one step commands consistently, Follows multi-step commands inconsistently Safety/Judgement: Decreased awareness of deficits General Comments: pt distracted by nausea and by inability to have bowel mvmt but able to selectively attend to mobility most of the time    Extremity Assessment (includes Sensation/Coordination)  Upper Extremity Assessment: Generalized weakness  Lower Extremity Assessment: Defer to PT evaluation    ADLs  Overall ADL's : Needs assistance/impaired Eating/Feeding: Modified independent Grooming: Minimal assistance, Standing Upper Body Bathing: Moderate assistance, Sitting Lower Body Bathing: Maximal assistance, Sit to/from stand Upper Body Dressing : Sitting, Maximal assistance Lower Body Dressing: Maximal assistance, Sit to/from stand Toilet Transfer: Ambulation, Minimal assistance (EVA) Functional mobility during ADLs: Minimal assistance, +2 for safety/equipment    Mobility  Overal bed mobility: Needs Assistance Bed Mobility: Sit to Sidelying, Rolling Rolling: Min assist Sidelying to sit: Min assist Sit to sidelying: Mod assist General bed mobility comments: pt received in chair    Transfers  Overall transfer level: Needs assistance Equipment used: None Transfers: Sit to/from Stand Sit to Stand: Min assist General  transfer comment: min A for maintaining fwd shift  to remain standing    Ambulation / Gait / Stairs / Wheelchair Mobility  Ambulation/Gait Ambulation/Gait assistance: Editor, commissioning (Feet): 40 Feet Assistive device: Standup Rollator Gait Pattern/deviations: Step-through pattern, Decreased stride length General Gait Details: pt not feeling well with ambulation, increased nausea, pale skin, ambulated 20' and then instructed to turn back to room due to symptoms. Pt had ambulated >200' earlier this AM when feeling better Gait velocity: decreased Gait velocity interpretation: 1.31 - 2.62 ft/sec, indicative of limited community ambulator    Posture / Balance  Balance Overall balance assessment: Needs assistance Sitting-balance support: No upper extremity supported, Feet supported Sitting balance-Leahy Scale: Good Standing balance support: Bilateral upper extremity supported, During functional activity Standing balance-Leahy Scale: Fair Standing balance comment: needs UE support    Special needs/care consideration    Previous Home Environment  Living Arrangements: Alone  Lives With: Alone Available Help at Discharge: Available PRN/intermittently Type of Home: Apartment Home Layout: Other (Comment) Home Access: Stairs to enter Entrance Stairs-Rails: Right, Left Entrance Stairs-Number of Steps: 10 + landing + 10 -  bilateral rails on first flight and landing but on 2nd flight there is only 1 rail on R side Bathroom Shower/Tub: Tub/shower unit, Engineer, building services: Standard Bathroom Accessibility: No Home Care Services: Yes Additional Comments: brother reports NO plan to move from his apartment, they were looking into [facility] if not able to get CIR  Discharge Living Setting Plans for Discharge Living Setting: Patient's home, Alone, Apartment Type of Home at Discharge: Apartment Discharge Home Layout: One level Discharge Home Access: Stairs to enter Entrance  Stairs-Rails: Right, Left Entrance Stairs-Number of Steps: 10 plus landing then 10 Discharge Bathroom Shower/Tub: Tub/shower unit Discharge Bathroom Toilet: Standard Discharge Bathroom Accessibility: No Does the patient have any problems obtaining your medications?: No  Social/Family/Support Systems Contact Information: brother, Optometrist Anticipated Caregiver: brother prn Anticipated Caregiver's Contact Information: see contacts Ability/Limitations of Caregiver: prn assist only Caregiver Availability: Intermittent Discharge Plan Discussed with Primary Caregiver: Yes Is Caregiver In Agreement with Plan?: Yes Does Caregiver/Family have Issues with Lodging/Transportation while Pt is in Rehab?: Yes  Goals Patient/Family Goal for Rehab: Mod I with PT and OT Expected length of stay: ELOS 7 to 10 days Pt/Family Agrees to Admission and willing to participate: Yes Program Orientation Provided & Reviewed with Pt/Caregiver Including Roles  & Responsibilities: Yes  Barriers to Discharge: Lack of/limited family support  Decrease burden of Care through IP rehab admission: n/a  Possible need for SNF placement upon discharge: not anticipated  Patient Condition: I have reviewed medical records from [facility], spoken with  patient and family member. I met with patient at the bedside for inpatient rehabilitation assessment.  Patient will benefit from ongoing PT and OT, can actively participate in 3 hours of therapy a day 5 days of the week, and can make measurable gains during the admission.  Patient will also benefit from the coordinated team approach during an Inpatient Acute Rehabilitation admission.  The patient will receive intensive therapy as well as Rehabilitation physician, nursing, social worker, and care management interventions.  Due to bladder management, bowel management, safety, skin/wound care, disease management, medication administration, pain management, and patient education the  patient requires 24 hour a day rehabilitation nursing.  The patient is currently Min to max assist with mobility and basic ADLs.  Discharge setting and therapy post discharge at home with home health is anticipated.  Patient has agreed to participate in the Acute Inpatient Rehabilitation Program and will admit today.  Preadmission Screen Completed By:  Clois Dupes, RN MSN 01/04/2023 11:00 AM ______________________________________________________________________   Discussed status with Dr. Shearon Stalls on 01/04/23 at 1100 and received approval for admission today.  Admission Coordinator:  Clois Dupes, RN MSN time 1100 Date 01/04/23   Assessment/Plan: Diagnosis: Debility secondary to PEA arrest s/p CABG Does the need for close, 24 hr/day Medical supervision in concert with the patient's rehab needs make it unreasonable for this patient to be served in a less intensive setting? Yes Co-Morbidities requiring supervision/potential  complications: Volume overload s/p CABG, pain management, multiple surgical wound management, A fib, thrombocytopenia, anemia, constipation and intermittent nausea Due to bowel management, safety, skin/wound care, disease management, medication administration, pain management, and patient education, does the patient require 24 hr/day rehab nursing? Yes Does the patient require coordinated care of a physician, rehab nurse, PT, OT to address physical and functional deficits in the context of the above medical diagnosis(es)? Yes Addressing deficits in the following areas: balance, endurance, locomotion, strength, transferring, bathing, dressing, feeding, grooming, and toileting Can the patient actively participate in an intensive therapy program of at least 3 hrs of therapy 5 days a week? Yes The potential for patient to make measurable gains while on inpatient rehab is excellent Anticipated functional outcomes upon discharge from inpatient rehab: modified  independent PT, modified independent OT Estimated rehab length of stay to reach the above functional goals is: 7-10 days Anticipated discharge destination: Home 10. Overall Rehab/Functional Prognosis: good   MD Signature:  Angelina Sheriff, DO 01/04/2023

## 2023-01-03 NOTE — Progress Notes (Signed)
Received pt from 2H  via w/c on room air alert and oriented no distress noted

## 2023-01-04 ENCOUNTER — Encounter (HOSPITAL_COMMUNITY): Payer: Self-pay | Admitting: Physical Medicine and Rehabilitation

## 2023-01-04 ENCOUNTER — Other Ambulatory Visit: Payer: Self-pay

## 2023-01-04 ENCOUNTER — Inpatient Hospital Stay (HOSPITAL_COMMUNITY)
Admission: AD | Admit: 2023-01-04 | Discharge: 2023-01-14 | DRG: 945 | Disposition: A | Discharge status: Home / Self Care | Payer: Managed Care, Other (non HMO) | Source: Intra-hospital | Attending: Physical Medicine and Rehabilitation | Admitting: Physical Medicine and Rehabilitation

## 2023-01-04 DIAGNOSIS — I252 Old myocardial infarction: Secondary | ICD-10-CM | POA: Diagnosis present

## 2023-01-04 DIAGNOSIS — Z951 Presence of aortocoronary bypass graft: Secondary | ICD-10-CM | POA: Diagnosis not present

## 2023-01-04 DIAGNOSIS — R4589 Other symptoms and signs involving emotional state: Secondary | ICD-10-CM

## 2023-01-04 DIAGNOSIS — D75838 Other thrombocytosis: Secondary | ICD-10-CM | POA: Diagnosis not present

## 2023-01-04 DIAGNOSIS — Z7901 Long term (current) use of anticoagulants: Secondary | ICD-10-CM

## 2023-01-04 DIAGNOSIS — G4700 Insomnia, unspecified: Secondary | ICD-10-CM | POA: Diagnosis present

## 2023-01-04 DIAGNOSIS — E871 Hypo-osmolality and hyponatremia: Secondary | ICD-10-CM | POA: Diagnosis present

## 2023-01-04 DIAGNOSIS — D62 Acute posthemorrhagic anemia: Secondary | ICD-10-CM | POA: Diagnosis present

## 2023-01-04 DIAGNOSIS — R252 Cramp and spasm: Secondary | ICD-10-CM | POA: Diagnosis present

## 2023-01-04 DIAGNOSIS — E612 Magnesium deficiency: Secondary | ICD-10-CM | POA: Diagnosis not present

## 2023-01-04 DIAGNOSIS — Z4802 Encounter for removal of sutures: Secondary | ICD-10-CM | POA: Diagnosis not present

## 2023-01-04 DIAGNOSIS — Z91030 Bee allergy status: Secondary | ICD-10-CM

## 2023-01-04 DIAGNOSIS — R570 Cardiogenic shock: Secondary | ICD-10-CM

## 2023-01-04 DIAGNOSIS — M7989 Other specified soft tissue disorders: Secondary | ICD-10-CM | POA: Diagnosis not present

## 2023-01-04 DIAGNOSIS — Z79899 Other long term (current) drug therapy: Secondary | ICD-10-CM

## 2023-01-04 DIAGNOSIS — E876 Hypokalemia: Secondary | ICD-10-CM | POA: Diagnosis present

## 2023-01-04 DIAGNOSIS — I5021 Acute systolic (congestive) heart failure: Secondary | ICD-10-CM | POA: Diagnosis present

## 2023-01-04 DIAGNOSIS — E669 Obesity, unspecified: Secondary | ICD-10-CM | POA: Diagnosis present

## 2023-01-04 DIAGNOSIS — E785 Hyperlipidemia, unspecified: Secondary | ICD-10-CM | POA: Diagnosis present

## 2023-01-04 DIAGNOSIS — Z880 Allergy status to penicillin: Secondary | ICD-10-CM

## 2023-01-04 DIAGNOSIS — R110 Nausea: Secondary | ICD-10-CM | POA: Diagnosis present

## 2023-01-04 DIAGNOSIS — R059 Cough, unspecified: Secondary | ICD-10-CM | POA: Diagnosis not present

## 2023-01-04 DIAGNOSIS — Z888 Allergy status to other drugs, medicaments and biological substances status: Secondary | ICD-10-CM

## 2023-01-04 DIAGNOSIS — I2510 Atherosclerotic heart disease of native coronary artery without angina pectoris: Secondary | ICD-10-CM | POA: Diagnosis present

## 2023-01-04 DIAGNOSIS — R5381 Other malaise: Secondary | ICD-10-CM | POA: Diagnosis present

## 2023-01-04 DIAGNOSIS — I110 Hypertensive heart disease with heart failure: Secondary | ICD-10-CM | POA: Diagnosis present

## 2023-01-04 DIAGNOSIS — K219 Gastro-esophageal reflux disease without esophagitis: Secondary | ICD-10-CM | POA: Diagnosis present

## 2023-01-04 DIAGNOSIS — Z87891 Personal history of nicotine dependence: Secondary | ICD-10-CM | POA: Diagnosis not present

## 2023-01-04 DIAGNOSIS — D696 Thrombocytopenia, unspecified: Secondary | ICD-10-CM | POA: Diagnosis present

## 2023-01-04 DIAGNOSIS — I951 Orthostatic hypotension: Secondary | ICD-10-CM | POA: Diagnosis present

## 2023-01-04 DIAGNOSIS — I10 Essential (primary) hypertension: Secondary | ICD-10-CM | POA: Diagnosis not present

## 2023-01-04 DIAGNOSIS — K5900 Constipation, unspecified: Secondary | ICD-10-CM | POA: Diagnosis present

## 2023-01-04 DIAGNOSIS — Z6836 Body mass index (BMI) 36.0-36.9, adult: Secondary | ICD-10-CM

## 2023-01-04 DIAGNOSIS — I4891 Unspecified atrial fibrillation: Secondary | ICD-10-CM | POA: Diagnosis present

## 2023-01-04 DIAGNOSIS — Z8674 Personal history of sudden cardiac arrest: Secondary | ICD-10-CM

## 2023-01-04 DIAGNOSIS — Z7982 Long term (current) use of aspirin: Secondary | ICD-10-CM

## 2023-01-04 LAB — GLUCOSE, CAPILLARY
Glucose-Capillary: 101 mg/dL — ABNORMAL HIGH (ref 70–99)
Glucose-Capillary: 92 mg/dL (ref 70–99)

## 2023-01-04 MED ORDER — TRAMADOL HCL 50 MG PO TABS
50.0000 mg | ORAL_TABLET | ORAL | Status: DC | PRN
  Administered 2023-01-04 – 2023-01-05 (×2): 50 mg via ORAL
  Administered 2023-01-05 – 2023-01-11 (×8): 100 mg via ORAL
  Administered 2023-01-11: 50 mg via ORAL
  Administered 2023-01-12: 100 mg via ORAL
  Filled 2023-01-04: qty 1
  Filled 2023-01-04 (×2): qty 2
  Filled 2023-01-04: qty 1
  Filled 2023-01-04 (×2): qty 2
  Filled 2023-01-04: qty 1
  Filled 2023-01-04 (×6): qty 2

## 2023-01-04 MED ORDER — METOPROLOL SUCCINATE ER 25 MG PO TB24
25.0000 mg | ORAL_TABLET | Freq: Every day | ORAL | Status: DC
Start: 2023-01-05 — End: 2023-01-09
  Administered 2023-01-05 – 2023-01-08 (×4): 25 mg via ORAL
  Filled 2023-01-04 (×4): qty 1

## 2023-01-04 MED ORDER — TORSEMIDE 20 MG PO TABS
40.0000 mg | ORAL_TABLET | Freq: Every day | ORAL | Status: DC
Start: 2023-01-05 — End: 2023-01-06
  Administered 2023-01-05 – 2023-01-06 (×2): 40 mg via ORAL
  Filled 2023-01-04 (×2): qty 2

## 2023-01-04 MED ORDER — APIXABAN 5 MG PO TABS: 5.0000 mg | ORAL_TABLET | Freq: Two times a day (BID) | ORAL | Status: DC

## 2023-01-04 MED ORDER — APIXABAN 5 MG PO TABS
5.0000 mg | ORAL_TABLET | Freq: Two times a day (BID) | ORAL | Status: DC
  Administered 2023-01-04: 5 mg via ORAL
  Filled 2023-01-04: qty 1

## 2023-01-04 MED ORDER — AMIODARONE HCL 200 MG PO TABS
200.0000 mg | ORAL_TABLET | Freq: Two times a day (BID) | ORAL | Status: DC
  Administered 2023-01-04 – 2023-01-14 (×20): 200 mg via ORAL
  Filled 2023-01-04 (×20): qty 1

## 2023-01-04 MED ORDER — APIXABAN 5 MG PO TABS
5.0000 mg | ORAL_TABLET | Freq: Two times a day (BID) | ORAL | Status: DC
  Administered 2023-01-04 – 2023-01-14 (×20): 5 mg via ORAL
  Filled 2023-01-04 (×20): qty 1

## 2023-01-04 MED ORDER — ONDANSETRON HCL 4 MG/2ML IJ SOLN
4.0000 mg | Freq: Four times a day (QID) | INTRAMUSCULAR | Status: DC | PRN
  Administered 2023-01-07: 4 mg via INTRAVENOUS
  Filled 2023-01-04: qty 2

## 2023-01-04 MED ORDER — DOCUSATE SODIUM 100 MG PO CAPS
200.0000 mg | ORAL_CAPSULE | Freq: Every day | ORAL | Status: DC
  Administered 2023-01-05 – 2023-01-14 (×9): 200 mg via ORAL
  Filled 2023-01-04 (×10): qty 2

## 2023-01-04 MED ORDER — ASPIRIN 81 MG PO CHEW: 81.0000 mg | CHEWABLE_TABLET | Freq: Every day | ORAL | Status: DC

## 2023-01-04 MED ORDER — FE FUM-VIT C-VIT B12-FA 460-60-0.01-1 MG PO CAPS
1.0000 | ORAL_CAPSULE | Freq: Every day | ORAL | Status: DC
  Administered 2023-01-05 – 2023-01-14 (×10): 1 via ORAL
  Filled 2023-01-04 (×11): qty 1

## 2023-01-04 MED ORDER — OXYCODONE HCL 5 MG PO TABS: 5.0000 mg | ORAL_TABLET | ORAL | Status: DC | PRN

## 2023-01-04 MED ORDER — METOPROLOL SUCCINATE ER 25 MG PO TB24
25.0000 mg | ORAL_TABLET | Freq: Every day | ORAL | Status: DC
  Administered 2023-01-04: 25 mg via ORAL
  Filled 2023-01-04: qty 1

## 2023-01-04 MED ORDER — SENNOSIDES-DOCUSATE SODIUM 8.6-50 MG PO TABS
1.0000 | ORAL_TABLET | Freq: Every day | ORAL | Status: DC
  Administered 2023-01-04: 1 via ORAL
  Filled 2023-01-04: qty 1

## 2023-01-04 MED ORDER — FLEET ENEMA RE ENEM
1.0000 | ENEMA | Freq: Once | RECTAL | Status: DC | PRN
Start: 2023-01-04 — End: 2023-01-14

## 2023-01-04 MED ORDER — GUAIFENESIN ER 600 MG PO TB12
600.0000 mg | ORAL_TABLET | Freq: Two times a day (BID) | ORAL | Status: DC
  Administered 2023-01-04 – 2023-01-14 (×20): 600 mg via ORAL
  Filled 2023-01-04 (×20): qty 1

## 2023-01-04 MED ORDER — TRAZODONE HCL 50 MG PO TABS
100.0000 mg | ORAL_TABLET | Freq: Every evening | ORAL | Status: DC | PRN
  Administered 2023-01-04 – 2023-01-06 (×3): 100 mg via ORAL
  Filled 2023-01-04 (×4): qty 2

## 2023-01-04 MED ORDER — ASPIRIN 81 MG PO TBEC
81.0000 mg | DELAYED_RELEASE_TABLET | Freq: Every day | ORAL | Status: DC
  Administered 2023-01-04: 81 mg via ORAL
  Filled 2023-01-04: qty 1

## 2023-01-04 MED ORDER — GUAIFENESIN ER 600 MG PO TB12: 600.0000 mg | ORAL_TABLET | Freq: Two times a day (BID) | ORAL | Status: AC

## 2023-01-04 MED ORDER — BISACODYL 10 MG RE SUPP
10.0000 mg | Freq: Every day | RECTAL | Status: DC
  Filled 2023-01-04 (×4): qty 1

## 2023-01-04 MED ORDER — MAGNESIUM HYDROXIDE 400 MG/5ML PO SUSP
30.0000 mL | Freq: Every day | ORAL | Status: DC | PRN
  Administered 2023-01-13: 30 mL via ORAL
  Filled 2023-01-04: qty 30

## 2023-01-04 MED ORDER — ROSUVASTATIN CALCIUM 20 MG PO TABS
20.0000 mg | ORAL_TABLET | Freq: Every day | ORAL | Status: DC
  Administered 2023-01-05 – 2023-01-14 (×10): 20 mg via ORAL
  Filled 2023-01-04 (×10): qty 1

## 2023-01-04 MED ORDER — BISACODYL 5 MG PO TBEC
10.0000 mg | DELAYED_RELEASE_TABLET | Freq: Every day | ORAL | Status: DC
  Administered 2023-01-05 – 2023-01-14 (×9): 10 mg via ORAL
  Filled 2023-01-04 (×10): qty 2

## 2023-01-04 MED ORDER — PANTOPRAZOLE SODIUM 40 MG PO TBEC
80.0000 mg | DELAYED_RELEASE_TABLET | Freq: Every day | ORAL | Status: DC
  Administered 2023-01-05 – 2023-01-14 (×10): 80 mg via ORAL
  Filled 2023-01-04 (×10): qty 2

## 2023-01-04 MED ORDER — DIGOXIN 125 MCG PO TABS
0.1250 mg | ORAL_TABLET | Freq: Every day | ORAL | Status: DC
  Administered 2023-01-05 – 2023-01-14 (×10): 0.125 mg via ORAL
  Filled 2023-01-04 (×10): qty 1

## 2023-01-04 MED ORDER — METOCLOPRAMIDE HCL 5 MG PO TABS
10.0000 mg | ORAL_TABLET | Freq: Three times a day (TID) | ORAL | Status: AC
  Administered 2023-01-04: 10 mg via ORAL
  Filled 2023-01-04: qty 2

## 2023-01-04 MED ORDER — ASPIRIN 81 MG PO TBEC
81.0000 mg | DELAYED_RELEASE_TABLET | Freq: Every day | ORAL | Status: DC
  Administered 2023-01-05 – 2023-01-14 (×10): 81 mg via ORAL
  Filled 2023-01-04 (×10): qty 1

## 2023-01-04 MED ORDER — ACETAMINOPHEN 325 MG PO TABS
325.0000 mg | ORAL_TABLET | ORAL | Status: DC | PRN
  Administered 2023-01-05 – 2023-01-14 (×12): 650 mg via ORAL
  Filled 2023-01-04 (×12): qty 2

## 2023-01-04 MED ORDER — ALUM & MAG HYDROXIDE-SIMETH 200-200-20 MG/5ML PO SUSP
30.0000 mL | ORAL | Status: DC | PRN
  Administered 2023-01-06: 30 mL via ORAL
  Filled 2023-01-04: qty 30

## 2023-01-04 MED ORDER — ONDANSETRON HCL 4 MG PO TABS: 4.0000 mg | ORAL_TABLET | Freq: Four times a day (QID) | ORAL | Status: DC | PRN

## 2023-01-04 MED ORDER — FE FUM-VIT C-VIT B12-FA 460-60-0.01-1 MG PO CAPS: 1.0000 | ORAL_CAPSULE | Freq: Every day | ORAL | Status: DC

## 2023-01-04 MED ORDER — SPIRONOLACTONE 25 MG PO TABS
25.0000 mg | ORAL_TABLET | Freq: Every day | ORAL | Status: DC
  Administered 2023-01-05 – 2023-01-08 (×4): 25 mg via ORAL
  Filled 2023-01-04 (×4): qty 1

## 2023-01-04 MED ORDER — SORBITOL 70 % SOLN
30.0000 mL | Freq: Every day | Status: DC | PRN
  Administered 2023-01-05 – 2023-01-13 (×2): 30 mL via ORAL
  Filled 2023-01-04 (×2): qty 30

## 2023-01-04 MED ORDER — METOPROLOL SUCCINATE ER 25 MG PO TB24: 25.0000 mg | ORAL_TABLET | Freq: Every day | ORAL | Status: DC

## 2023-01-04 NOTE — Progress Notes (Signed)
Inpatient Rehabilitation Admissions Coordinator   I have insurance approval and Cir bed available to admit him to today. Acute team and TOC made aware. I will make the arrangements.  Ottie Glazier, RN, MSN Rehab Admissions Coordinator [phone] 01/04/2023 8:42 AM

## 2023-01-04 NOTE — Progress Notes (Signed)
PHARMACY - ANTICOAGULATION CONSULT NOTE  Pharmacy Consult for apixaban Indication: atrial fibrillation  Allergies  Allergen Reactions   Bee Venom Palpitations   Diclofenac Diarrhea   Amoxicillin Diarrhea and Nausea Only   Lisinopril Cough   Pantoprazole Nausea Only    Other Reaction(s): chest pain    Patient Measurements: Height: 6\' 3"  (190.5 cm) Weight: 127.1 kg (280 lb 3.3 oz) IBW/kg (Calculated) : 84.5   Vital Signs: Temp: 98.4 F (36.9 C) (12/06 0322) Temp Source: Oral (12/06 0322) BP: 123/82 (12/06 0322) Pulse Rate: 86 (12/06 0619)  Labs: Recent Labs    01/01/23 1648 01/02/23 0432 01/03/23 0243  HGB 8.9* 8.0* 7.5*  HCT 27.3* 24.7* 23.2*  PLT 175 136* 146*  CREATININE 0.66 0.80 0.81    Estimated Creatinine Clearance: 135.8 mL/min (by C-G formula based on SCr of 0.81 mg/dL).   Medical History: Past Medical History:  Diagnosis Date   Atrial fibrillation (HCC)    CHF (congestive heart failure) (HCC)    Coronary artery disease    Dysrhythmia    A. Fib   HLD (hyperlipidemia)    HTN (hypertension)    MI (myocardial infarction) (HCC)     Medications:  Medications Prior to Admission  Medication Sig Dispense Refill Last Dose   acetaminophen (TYLENOL) 325 MG tablet Take 1-2 tablets (325-650 mg total) by mouth every 4 (four) hours as needed for mild pain (pain score 1-3).   12/30/2022   amiodarone (PACERONE) 200 MG tablet Take 1 tablet (200 mg total) by mouth 2 (two) times daily. 60 tablet 0 12/31/2022 at 0430   aspirin 81 MG chewable tablet Chew 1 tablet (81 mg total) by mouth daily. 30 tablet 0 12/30/2022   digoxin (LANOXIN) 0.125 MG tablet Take 1 tablet (0.125 mg total) by mouth daily. 30 tablet 0 12/31/2022 at 0430   esomeprazole (NEXIUM) 40 MG capsule Take 1 capsule (40 mg total) by mouth 2 (two) times daily before a meal. 60 capsule 5 12/31/2022 at 0430   guaiFENesin (MUCINEX) 600 MG 12 hr tablet Take 1 tablet (600 mg total) by mouth 2 (two) times daily as  needed 30 tablet 0 Past Month   guaiFENesin (MUCINEX) 600 MG 12 hr tablet Take 1 tablet (600 mg total) by mouth 2 (two) times daily as needed. 30 tablet 0 Past Month   metoprolol succinate (TOPROL-XL) 50 MG 24 hr tablet Take 1 tablet (50 mg total) by mouth daily. Take with or immediately following a meal. 30 tablet 0 12/31/2022 at 0430   nitroGLYCERIN (NITROSTAT) 0.4 MG SL tablet Place 0.4 mg under the tongue every 5 (five) minutes as needed.   Past Week   rosuvastatin (CRESTOR) 20 MG tablet Take 1 tablet (20 mg total) by mouth daily. 30 tablet 0 12/31/2022 at 0430   spironolactone (ALDACTONE) 25 MG tablet Take 1 tablet (25 mg total) by mouth daily. 30 tablet 0 12/31/2022 at 0430   torsemide (DEMADEX) 20 MG tablet Take 2 tablets (40 mg total) by mouth daily. 30 tablet 0 12/30/2022   traZODone (DESYREL) 100 MG tablet Take 1 tablet (100 mg total) by mouth at bedtime as needed for sleep. 30 tablet 0 12/30/2022   enoxaparin (LOVENOX) 120 MG/0.8ML injection Inject 0.8 mLs (120 mg total) into the skin every 12 (twelve) hours for 10 doses. 8 mL 0 11/30   psyllium (HYDROCIL/METAMUCIL) 95 % PACK Take 1 packet by mouth daily.      Scheduled:   acetaminophen  1,000 mg Oral Q6H  Or   acetaminophen (TYLENOL) oral liquid 160 mg/5 mL  1,000 mg Per Tube Q6H   amiodarone  200 mg Oral BID   apixaban  5 mg Oral BID   aspirin EC  81 mg Oral Daily   Or   aspirin  81 mg Per Tube Daily   bisacodyl  10 mg Oral Daily   Or   bisacodyl  10 mg Rectal Daily   Chlorhexidine Gluconate Cloth  6 each Topical Daily   digoxin  0.125 mg Oral Daily   docusate sodium  200 mg Oral Daily   Fe Fum-Vit C-Vit B12-FA  1 capsule Oral Daily   guaiFENesin  600 mg Oral BID   insulin aspart  0-24 Units Subcutaneous TID WC   metoCLOPramide  10 mg Oral TID AC   metoprolol succinate  25 mg Oral Daily   pantoprazole  80 mg Oral Q1200   rosuvastatin  20 mg Oral Daily   sodium chloride flush  3 mL Intravenous Q12H   spironolactone  25 mg  Oral Daily   torsemide  40 mg Oral Daily    Assessment: 62 yo male s/p elective CABG. He was started on apixaban in 11/2022 for afib and possible PE. Pharmacy consulted to resume apixaban today   -Hg= 7.5 on 12/5, plt= 146  Goal of Therapy:  Monitor platelets by anticoagulation protocol: Yes   Plan:  -resume apixaban 5mg  po bid -Will follow patient progress  Harland German, PharmD Clinical Pharmacist **Pharmacist phone directory can now be found on [email or web address] ([email or web address]).  Listed under [facility].

## 2023-01-04 NOTE — TOC Transition Note (Signed)
Transition of Care (TOC) - CM/SW Discharge Note Donn Pierini RN, BSN Transitions of Care Unit 4E- RN Case Manager See Treatment Team for direct phone #   Patient Details  Name: Jeremiah Dixon MRN: [id number] Date of Birth: 11-15-1960  Transition of Care [facility]) CM/SW Contact:  Darrold Span, RN Phone Number: 01/04/2023, 10:07 AM   Clinical Narrative:    TOC notified this am by CIR liaison that pt has been approved by insurance for CIR admit- bed available today.  Pt stable for transition to INPT rehab and will transition later today.   TCTS office had made referral to [facility] for [facility] needs- liaison to follow pt in rehab for [facility] needs post INPT rehab stay.   No further TOC needs at this time       Patient Goals and CMS Choice    To get stronger  Discharge Placement                 [facility] INPT rehab        Discharge Plan and Services Additional resources added to the After Visit Summary for                  DME Arranged: N/A DME Agency: NA         HH Agency: [facility] ([facility]     Representative spoke with at [facility] Agency: Clarise Cruz  Social Determinants of Health (SDOH) Interventions SDOH Screenings   Food Insecurity: No Food Insecurity (12/31/2022)  Housing: Low Risk  (12/31/2022)  Transportation Needs: Unmet Transportation Needs (12/31/2022)  Utilities: Not At Risk (12/31/2022)  Alcohol Screen: Medium Risk (12/06/2022)  Financial Resource Strain: Low Risk  (12/06/2022)  Tobacco Use: Medium Risk (12/31/2022)     Readmission Risk Interventions    01/04/2023   10:07 AM  Readmission Risk Prevention Plan  Transportation Screening Complete  HRI or Home Care Consult Complete  Social Work Consult for Recovery Care Planning/Counseling Complete  Palliative Care Screening Not Applicable  Medication Review Oceanographer) Complete

## 2023-01-04 NOTE — Progress Notes (Addendum)
      [address]       [place] [address]             [phone]      4 Days Post-Op Procedure(s) (LRB): CORONARY ARTERY BYPASS GRAFTING X 3, USING LEFT INTERNAL MAMMARY ARTERY AND OPEN HARVESTED LEFT SAPHENOUS VEIN GRAFT, ATTEMPTED ENDOSCOPIC RIGHT SAPHENOUS VEIN HARVEST (N/A) CLIPPING OF ATRIAL APPENDAGE WITH PULMONARY VEIN ISOLATION (N/A) TRANSESOPHAGEAL ECHOCARDIOGRAM (N/A)  Subjective:  Patient sitting up in chair.  Had some N/V yesterday, which has resolved now that he has moved his bowels.  He is hoping to go to CIR today.    Objective: Vital signs in last 24 hours: Temp:  [97.6 F (36.4 C)-98.7 F (37.1 C)] 98.4 F (36.9 C) (12/06 0322) Pulse Rate:  [78-93] 86 (12/06 0619) Cardiac Rhythm: Normal sinus rhythm;Heart block (12/05 2256) Resp:  [16-24] 20 (12/06 0322) BP: (105-149)/(70-89) 123/82 (12/06 0322) SpO2:  [95 %-100 %] 97 % (12/06 0619) Weight:  [127 kg-127.1 kg] 127.1 kg (12/06 0619)  Intake/Output from previous day: 12/05 0701 - 12/06 0700 In: 300 [P.O.:300] Out: 1150 [Urine:1150]  General appearance: alert, cooperative, and no distress Heart: regular rate and rhythm Lungs: clear to auscultation bilaterally Abdomen: soft, non-tender; bowel sounds normal; no masses,  no organomegaly Extremities: edema trace Wound: clean sternotomy, RLE incision clean, minor bloody oozing, multiple sutures in place.Marland Kitchen ecchymosis BLE  Lab Results: Recent Labs    01/02/23 0432 01/03/23 0243  WBC 7.7 8.1  HGB 8.0* 7.5*  HCT 24.7* 23.2*  PLT 136* 146*   BMET:  Recent Labs    01/02/23 0432 01/03/23 0243  NA 127* 129*  K 4.0 3.8  CL 98 93*  CO2 24 26  GLUCOSE 124* 119*  BUN 9 13  CREATININE 0.80 0.81  CALCIUM 8.1* 8.5*    PT/INR: No results for input(s): "LABPROT", "INR" in the last 72 hours. ABG    Component Value Date/Time   PHART 7.314 (L) 12/31/2022 1906   HCO3 23.3 12/31/2022 1906   TCO2 25 12/31/2022 1906   ACIDBASEDEF 3.0 (H)  12/31/2022 1906   O2SAT 99 12/31/2022 1906   CBG (last 3)  Recent Labs    01/03/23 1647 01/03/23 2111 01/04/23 0623  GLUCAP 107* 108* 101*    Assessment/Plan: S/P Procedure(s) (LRB): CORONARY ARTERY BYPASS GRAFTING X 3, USING LEFT INTERNAL MAMMARY ARTERY AND OPEN HARVESTED LEFT SAPHENOUS VEIN GRAFT, ATTEMPTED ENDOSCOPIC RIGHT SAPHENOUS VEIN HARVEST (N/A) CLIPPING OF ATRIAL APPENDAGE WITH PULMONARY VEIN ISOLATION (N/A) TRANSESOPHAGEAL ECHOCARDIOGRAM (N/A)  CV- S/P PV Isolation, maintaining NSR on Amiodarone, Digoxin, Toprol has not been resumed, however BP is started to be elevated at times, could possibly start 25 mg daily.. will consult pharmacy to resume Eliquis, reduce ASA to 81 Pulm- no acute issues, off oxygen, continue IS Renal- creatinine has been stable, K is at 3.8 in setting of aggressive diuretics, will give low dose today, ideally would like around 4 Post operative thrombocytopenia- improving up to 146 K yesterday Anemia, chronic present on admission in November, exacerbated by surgery, not clinically significant at this time.. patient is asymptomatic.. continue iron supplementation Deconditioning- PT recs CIR, insurance has approved Dispo-patient stable, discussed with Dr. Laneta Simmers okay for CIR today if bed is available   LOS: 4 days    Lowella Dandy, PA-C 01/04/2023

## 2023-01-04 NOTE — Progress Notes (Signed)
CARDIAC REHAB PHASE I   PRE:  Rate/Rhythm: 86 NSR  BP:  Sitting: 124/80      SpO2: 99 RA  MODE:  Ambulation: 60 ft    POST:  Rate/Rhythm: 96 NSR  BP:  Sitting: 135/86      SpO2: 96 RA  Pt ambulated with standby/contact guard assistance, pt able to stand after x2 attempts with min assistance. Pt dizzy after standing but able to continue with ambulation in hallway. Dizziness became worst (6/10) with continued ambulation so we returned back to room. Pt helped to chair with all needs met.   Pt received [facility] book and education on restrictions, heart healthy diet, ex guidelines, Move in the Tube sheet, incentive spirometer use when d/c and CRPII. Pt denies questions and was encouraged to look in the book for additional information. Referral placed to [facility].   Pt doesn't drive, reports that his brother can help transport him to appointments.   [name]  MS, ACSM-CEP 8:49 AM 01/04/2023    Service time is from 0805 to 0855.

## 2023-01-04 NOTE — Progress Notes (Signed)
Angelina Sheriff, DO  Physician Physical Medicine and Rehabilitation   PMR Pre-admission    Signed   Date of Service: 01/03/2023  4:11 PM  Related encounter: Admission (Discharged) from 12/31/2022 in [facility] 4E CV SURGICAL PROGRESSIVE CARE   Signed     Expand All Collapse All  Show:Clear all [x] Written[x] Templated[] Copied  Added by: [x] Standley Brooking, RN[x] Angelina Sheriff, DO  [] Hover for details PMR Admission Coordinator Pre-Admission Assessment   Patient: Jeremiah Dixon is an 62 y.o., male MRN: [id number] DOB: 11-04-1960 Height: 6\' 3"  (190.5 cm) Weight: 127.1 kg   Insurance Information HMO: yes    PPO:      PCP:      IPA:      80/20:      OTHER:  PRIMARY: [employer]      Policy#: [id number]      Subscriber: pt CM Name: Tamala Bari      Phone#: [phone] ext [phone]     Fax#: [phone] Pre-Cert#: [id number] approved 12/6 until 12/12 with update due 12/13 f/u with Shelah Lewandowsky phone [phone] fax [phone]      Employer: Benefits:  Phone #: [phone]     Name: 12/5 Eff. Date: 06/04/22 until 12/31 24     Deduct: $4750      Out of Pocket Max: $7000      Life Max: none CIR: After deductible, 70%      SNF: 70% Outpatient: 70%     Co-Pay: 30% Home Health: 70%      Co-Pay: 30% 60 visits combined DME: 70%     Co-Pay: 30% Providers: in network   [employer] for [facility] and DME phone [phone] fax [phone]. Portal [email or web address]  SECONDARY: none   Financial Counselor:       Phone#:    The Engineer, materials Information Summary" for patients in Inpatient Rehabilitation Facilities with attached "Privacy Act Statement-Health Care Records" was provided and verbally reviewed with: N/A   Emergency Contact Information Contact Information       Name Relation Home Work Mobile    Flud,Bill Brother     [phone]    Verrill,Erica Niece     [phone]         Other Contacts   None on File      Current Medical History  Patient  Admitting Diagnosis: CABG, debility   History of Present Illness:  62 year old male with history of CAD, morbid obesity, HTN, HLD, alcohol abuse ( 1/2 gallon per day) and prediabetes.  Recent admit to [facility] on 11/7 for chest pain and SOB. Developed into respiratory arrest. Developed Afib with RVR, cardiogenic shock and acute systolic CHF. Cardioverted on 11/15. Cardiac catherization revealed severe three vessel CAD. CVTS consulted with planned elective CABG 12/2. Underwent left thoracentesis for left pleural effusion. Admitted to CIR 11/21 until 11/26 and discharged home at independent level.   Presented on 12/31/22 for planned CABG X 3, pulmonary vein isolation ablation and clipping of left atrial appendage.Marland Kitchen Postoperative diuresing , removal of chest tubes, pacing wires. Constipation noted on 12/4 and nausea. Relieved with B< on 12/5. To resume Eliquis for PAF prior to discharge.   Patient's medical record from [facility] has been reviewed by the rehabilitation admission coordinator and physician.   Past Medical History      Past Medical History:  Diagnosis Date   Atrial fibrillation (HCC)     CHF (congestive heart failure) (HCC)     Coronary artery disease  Dysrhythmia      A. Fib   HLD (hyperlipidemia)     HTN (hypertension)     MI (myocardial infarction) (HCC)          Has the patient had major surgery during 100 days prior to admission? Yes   Family History   family history is not on file.   Current Medications  Current Medications    Current Facility-Administered Medications:    acetaminophen (TYLENOL) tablet 1,000 mg, 1,000 mg, Oral, Q6H, 1,000 mg at 01/04/23 0620 **OR** acetaminophen (TYLENOL) 160 MG/5ML solution 1,000 mg, 1,000 mg, Per Tube, Q6H, Barrett, Erin R, PA-C   amiodarone (PACERONE) tablet 200 mg, 200 mg, Oral, BID, Barrett, Erin R, PA-C, 200 mg at 01/04/23 0830   apixaban (ELIQUIS) tablet 5 mg, 5 mg, Oral, BID, Silvana Newness, RPH, 5 mg  at 01/04/23 0830   aspirin EC tablet 81 mg, 81 mg, Oral, Daily, 81 mg at 01/04/23 0830 **OR** aspirin chewable tablet 81 mg, 81 mg, Per Tube, Daily, Barrett, Erin R, PA-C   bisacodyl (DULCOLAX) EC tablet 10 mg, 10 mg, Oral, Daily, 10 mg at 01/03/23 4098 **OR** bisacodyl (DULCOLAX) suppository 10 mg, 10 mg, Rectal, Daily, Barrett, Erin R, PA-C   Chlorhexidine Gluconate Cloth 2 % PADS 6 each, 6 each, Topical, Daily, [name], MD, 6 each at 01/04/23 0834   digoxin (LANOXIN) tablet 0.125 mg, 0.125 mg, Oral, Daily, Barrett, Erin R, PA-C, 0.125 mg at 01/04/23 0830   docusate sodium (COLACE) capsule 200 mg, 200 mg, Oral, Daily, Barrett, Erin R, PA-C, 200 mg at 01/04/23 0830   Fe Fum-Vit C-Vit B12-FA (TRIGELS-F FORTE) capsule 1 capsule, 1 capsule, Oral, Daily, Bartle, Payton Doughty, MD, 1 capsule at 01/04/23 0830   guaiFENesin (MUCINEX) 12 hr tablet 600 mg, 600 mg, Oral, BID, [name], MD, 600 mg at 01/04/23 0831   CBG monitoring, , , 4x Daily, AC & HS **AND** insulin aspart (novoLOG) injection 0-24 Units, 0-24 Units, Subcutaneous, TID WC, [name], MD, 2 Units at 01/02/23 1138   metoCLOPramide (REGLAN) tablet 10 mg, 10 mg, Oral, TID AC, [name], MD, 10 mg at 01/04/23 0829   metoprolol succinate (TOPROL-XL) 24 hr tablet 25 mg, 25 mg, Oral, Daily, Barrett, Erin R, PA-C, 25 mg at 01/04/23 0830   morphine (PF) 2 MG/ML injection 1-4 mg, 1-4 mg, Intravenous, Q1H PRN, Barrett, Erin R, PA-C, 2 mg at 01/02/23 0950   ondansetron (ZOFRAN) injection 4 mg, 4 mg, Intravenous, Q6H PRN, Barrett, Erin R, PA-C, 4 mg at 01/03/23 0545   Oral care mouth rinse, 15 mL, Mouth Rinse, PRN, [name], MD   oxyCODONE (Oxy IR/ROXICODONE) immediate release tablet 5-10 mg, 5-10 mg, Oral, Q3H PRN, Barrett, Erin R, PA-C, 10 mg at 01/02/23 0949   pantoprazole (PROTONIX) EC tablet 80 mg, 80 mg, Oral, Q1200, Barrett, Erin R, PA-C, 80 mg at 01/03/23 1304   rosuvastatin (CRESTOR) tablet 20 mg, 20 mg, Oral, Daily,  Barrett, Erin R, PA-C, 20 mg at 01/04/23 0831   sodium chloride flush (NS) 0.9 % injection 3 mL, 3 mL, Intravenous, Q12H, Barrett, Erin R, PA-C, 3 mL at 01/04/23 0834   sodium chloride flush (NS) 0.9 % injection 3 mL, 3 mL, Intravenous, PRN, Barrett, Erin R, PA-C   spironolactone (ALDACTONE) tablet 25 mg, 25 mg, Oral, Daily, Bartle, Bryan K, MD, 25 mg at 01/04/23 0830   torsemide (DEMADEX) tablet 40 mg, 40 mg, Oral, Daily, Bartle, Payton Doughty, MD, 40  mg at 01/04/23 0831   traMADol (ULTRAM) tablet 50-100 mg, 50-100 mg, Oral, Q4H PRN, Barrett, Erin R, PA-C, 50 mg at 01/02/23 1145   traZODone (DESYREL) tablet 100 mg, 100 mg, Oral, QHS PRN, Barrett, Erin R, PA-C, 100 mg at 01/03/23 2241     Patients Current Diet:  Diet Order                  Diet Carb Modified Fluid consistency: Thin; Room service appropriate? Yes  Diet effective now                       Precautions / Restrictions Precautions Precautions: Fall, Sternal Precaution Booklet Issued: Yes (comment) Precaution Comments: review of sternal precautions Restrictions Weight Bearing Restrictions: Yes (sternal prec) RUE Weight Bearing: Non weight bearing LUE Weight Bearing: Non weight bearing    Has the patient had 2 or more falls or a fall with injury in the past year? No   Prior Activity Level Community (5-7x/wk): prior to last admit indepnedent, working  and driving   Prior Functional Level Self Care: Did the patient need help bathing, dressing, using the toilet or eating? Independent   Indoor Mobility: Did the patient need assistance with walking from room to room (with or without device)? Independent   Stairs: Did the patient need assistance with internal or external stairs (with or without device)? Independent   Functional Cognition: Did the patient need help planning regular tasks such as shopping or remembering to take medications? Independent   Patient Information Are you of Hispanic, Latino/a,or Spanish origin?: A.  No, not of Hispanic, Latino/a, or Spanish origin What is your race?: A. White Do you need or want an interpreter to communicate with a doctor or health care staff?: 0. No   Patient's Response To:  Health Literacy and Transportation Is the patient able to respond to health literacy and transportation needs?: Yes Health Literacy - How often do you need to have someone help you when you read instructions, pamphlets, or other written material from your doctor or pharmacy?: Never In the past 12 months, has lack of transportation kept you from medical appointments or from getting medications?: No In the past 12 months, has lack of transportation kept you from meetings, work, or from getting things needed for daily living?: No   Home Assistive Devices / Equipment Home Equipment: None   Prior Device Use: Indicate devices/aids used by the patient prior to current illness, exacerbation or injury? None of the above   Current Functional Level Cognition   Arousal/Alertness: Awake/alert Overall Cognitive Status: Impaired/Different from baseline Current Attention Level: Selective Orientation Level: Oriented X4 Following Commands: Follows one step commands consistently, Follows multi-step commands inconsistently Safety/Judgement: Decreased awareness of deficits General Comments: pt distracted by nausea and by inability to have bowel mvmt but able to selectively attend to mobility most of the time    Extremity Assessment (includes Sensation/Coordination)   Upper Extremity Assessment: Generalized weakness  Lower Extremity Assessment: Defer to PT evaluation     ADLs   Overall ADL's : Needs assistance/impaired Eating/Feeding: Modified independent Grooming: Minimal assistance, Standing Upper Body Bathing: Moderate assistance, Sitting Lower Body Bathing: Maximal assistance, Sit to/from stand Upper Body Dressing : Sitting, Maximal assistance Lower Body Dressing: Maximal assistance, Sit to/from  stand Toilet Transfer: Ambulation, Minimal assistance (EVA) Functional mobility during ADLs: Minimal assistance, +2 for safety/equipment     Mobility   Overal bed mobility: Needs Assistance Bed Mobility: Sit to Sidelying, Rolling  Rolling: Min assist Sidelying to sit: Min assist Sit to sidelying: Mod assist General bed mobility comments: pt received in chair     Transfers   Overall transfer level: Needs assistance Equipment used: None Transfers: Sit to/from Stand Sit to Stand: Min assist General transfer comment: min A for maintaining fwd shift  to remain standing     Ambulation / Gait / Stairs / Wheelchair Mobility   Ambulation/Gait Ambulation/Gait assistance: Editor, commissioning (Feet): 40 Feet Assistive device: Standup Rollator Gait Pattern/deviations: Step-through pattern, Decreased stride length General Gait Details: pt not feeling well with ambulation, increased nausea, pale skin, ambulated 20' and then instructed to turn back to room due to symptoms. Pt had ambulated >200' earlier this AM when feeling better Gait velocity: decreased Gait velocity interpretation: 1.31 - 2.62 ft/sec, indicative of limited community ambulator     Posture / Balance Balance Overall balance assessment: Needs assistance Sitting-balance support: No upper extremity supported, Feet supported Sitting balance-Leahy Scale: Good Standing balance support: Bilateral upper extremity supported, During functional activity Standing balance-Leahy Scale: Fair Standing balance comment: needs UE support     Special needs/care consideration      Previous Home Environment  Living Arrangements: Alone  Lives With: Alone Available Help at Discharge: Available PRN/intermittently Type of Home: Apartment Home Layout: Other (Comment) Home Access: Stairs to enter Entrance Stairs-Rails: Right, Left Entrance Stairs-Number of Steps: 10 + landing + 10 -  bilateral rails on first flight and landing but on 2nd  flight there is only 1 rail on R side Bathroom Shower/Tub: Tub/shower unit, Engineer, building services: Standard Bathroom Accessibility: No Home Care Services: Yes Additional Comments: brother reports NO plan to move from his apartment, they were looking into [facility] if not able to get CIR   Discharge Living Setting Plans for Discharge Living Setting: Patient's home, Alone, Apartment Type of Home at Discharge: Apartment Discharge Home Layout: One level Discharge Home Access: Stairs to enter Entrance Stairs-Rails: Right, Left Entrance Stairs-Number of Steps: 10 plus landing then 10 Discharge Bathroom Shower/Tub: Tub/shower unit Discharge Bathroom Toilet: Standard Discharge Bathroom Accessibility: No Does the patient have any problems obtaining your medications?: No   Social/Family/Support Systems Contact Information: brother, Optometrist Anticipated Caregiver: brother prn Anticipated Caregiver's Contact Information: see contacts Ability/Limitations of Caregiver: prn assist only Caregiver Availability: Intermittent Discharge Plan Discussed with Primary Caregiver: Yes Is Caregiver In Agreement with Plan?: Yes Does Caregiver/Family have Issues with Lodging/Transportation while Pt is in Rehab?: Yes   Goals Patient/Family Goal for Rehab: Mod I with PT and OT Expected length of stay: ELOS 7 to 10 days Pt/Family Agrees to Admission and willing to participate: Yes Program Orientation Provided & Reviewed with Pt/Caregiver Including Roles  & Responsibilities: Yes  Barriers to Discharge: Lack of/limited family support   Decrease burden of Care through IP rehab admission: n/a   Possible need for SNF placement upon discharge: not anticipated   Patient Condition: I have reviewed medical records from [facility], spoken with  patient and family member. I met with patient at the bedside for inpatient rehabilitation assessment.  Patient will benefit from ongoing PT and OT, can actively  participate in 3 hours of therapy a day 5 days of the week, and can make measurable gains during the admission.  Patient will also benefit from the coordinated team approach during an Inpatient Acute Rehabilitation admission.  The patient will receive intensive therapy as well as Rehabilitation physician, nursing, social worker, and care management interventions.  Due  to bladder management, bowel management, safety, skin/wound care, disease management, medication administration, pain management, and patient education the patient requires 24 hour a day rehabilitation nursing.  The patient is currently Min to max assist with mobility and basic ADLs.  Discharge setting and therapy post discharge at home with home health is anticipated.  Patient has agreed to participate in the Acute Inpatient Rehabilitation Program and will admit today.   Preadmission Screen Completed By:  Clois Dupes, RN MSN 01/04/2023 11:00 AM ______________________________________________________________________   Discussed status with Dr. Shearon Stalls on 01/04/23 at 1100 and received approval for admission today.   Admission Coordinator:  Clois Dupes, RN MSN time 1100 Date 01/04/23    Assessment/Plan: Diagnosis: Debility secondary to PEA arrest s/p CABG Does the need for close, 24 hr/day Medical supervision in concert with the patient's rehab needs make it unreasonable for this patient to be served in a less intensive setting? Yes Co-Morbidities requiring supervision/potential complications: Volume overload s/p CABG, pain management, multiple surgical wound management, A fib, thrombocytopenia, anemia, constipation and intermittent nausea Due to bowel management, safety, skin/wound care, disease management, medication administration, pain management, and patient education, does the patient require 24 hr/day rehab nursing? Yes Does the patient require coordinated care of a physician, rehab nurse, PT, OT to address physical  and functional deficits in the context of the above medical diagnosis(es)? Yes Addressing deficits in the following areas: balance, endurance, locomotion, strength, transferring, bathing, dressing, feeding, grooming, and toileting Can the patient actively participate in an intensive therapy program of at least 3 hrs of therapy 5 days a week? Yes The potential for patient to make measurable gains while on inpatient rehab is excellent Anticipated functional outcomes upon discharge from inpatient rehab: modified independent PT, modified independent OT Estimated rehab length of stay to reach the above functional goals is: 7-10 days Anticipated discharge destination: Home 10. Overall Rehab/Functional Prognosis: good     MD Signature:   Angelina Sheriff, DO 01/04/2023           Revision History

## 2023-01-04 NOTE — H&P (Addendum)
Physical Medicine and Rehabilitation Admission H&P     CC: Functional deficits secondary to cardiac debility   HPI: Jeremiah Dixon is a 62 year old male who is s/p CABG times 3 vessels, pulmonary vein isolation and LAA clipping by Dr. Laneta Simmers on 12/31/2022. He was recently admitted 11/07 secondary to PEA arrest, underwent cardiac catheterization and plan for CABG. Started long-term anti-coagulation for atrial fibrillation. He was discharged and admitted to CIR on 11/20. Discharge home on 11/26. He has done well post-operatively. Extubated, chest tubes removed and ambulating with PT. Diuresis continued. Constipation addressed. Plan resuming Eliquis at discharge. The patient requires inpatient medicine and rehabilitation evaluations and services for ongoing dysfunction secondary to cardiac disability.    ROS:  - CONSTITUTIONAL: Denies weight loss, fever and chills. +Orthostasis  - HEENT: Denies changes in vision and hearing.  - RESPIRATORY: Denies SOB and cough.  - CV: Denies palpitations and CP.  - GI: Denies abdominal pain, +nausea, no vomiting and diarrhea.  - GU: Denies dysuria and urinary frequency.  - MSK: Denies myalgia and joint pain.  - SKIN: Denies rash and pruritus.  - NEUROLOGICAL: Denies headache   - PSYCHIATRIC: Denies recent changes in mood. Denies anxiety and depression.       Past Medical History:  Diagnosis Date   Atrial fibrillation (HCC)     CHF (congestive heart failure) (HCC)     Coronary artery disease     Dysrhythmia      A. Fib   HLD (hyperlipidemia)     HTN (hypertension)     MI (myocardial infarction) [facility])               Past Surgical History:  Procedure Laterality Date   CARDIOVERSION N/A 12/14/2022    Procedure: CARDIOVERSION (CATH LAB);  Surgeon: Dolores Patty, MD;  Location: [facility] INVASIVE CV LAB;  Service: Cardiovascular;  Laterality: N/A;   CLIPPING OF ATRIAL APPENDAGE N/A 12/31/2022    Procedure: CLIPPING OF ATRIAL APPENDAGE WITH  PULMONARY VEIN ISOLATION;  Surgeon: [name], MD;  Location: [facility] OR;  Service: Open Heart Surgery;  Laterality: N/A;   CORONARY ARTERY BYPASS GRAFT N/A 12/31/2022    Procedure: CORONARY ARTERY BYPASS GRAFTING X 3, USING LEFT INTERNAL MAMMARY ARTERY AND OPEN HARVESTED LEFT SAPHENOUS VEIN GRAFT, ATTEMPTED ENDOSCOPIC RIGHT SAPHENOUS VEIN HARVEST;  Surgeon: [name], MD;  Location: [facility] OR;  Service: Open Heart Surgery;  Laterality: N/A;   RIGHT HEART CATH N/A 12/06/2022    Procedure: RIGHT HEART CATH;  Surgeon: Laurey Morale, MD;  Location: [facility] INVASIVE CV LAB;  Service: Cardiovascular;  Laterality: N/A;   RIGHT/LEFT HEART CATH AND CORONARY ANGIOGRAPHY N/A 12/10/2022    Procedure: RIGHT/LEFT HEART CATH AND CORONARY ANGIOGRAPHY;  Surgeon: Dolores Patty, MD;  Location: [facility] INVASIVE CV LAB;  Service: Cardiovascular;  Laterality: N/A;   TEE WITHOUT CARDIOVERSION N/A 12/31/2022    Procedure: TRANSESOPHAGEAL ECHOCARDIOGRAM;  Surgeon: [name], MD;  Location: [facility] OR;  Service: Open Heart Surgery;  Laterality: N/A;   TRANSESOPHAGEAL ECHOCARDIOGRAM (CATH LAB) N/A 12/14/2022    Procedure: TRANSESOPHAGEAL ECHOCARDIOGRAM;  Surgeon: Dolores Patty, MD;  Location: [facility] INVASIVE CV LAB;  Service: Cardiovascular;  Laterality: N/A;        History reviewed. No pertinent family history.     Social History:  reports that he has quit smoking. His smoking use included cigarettes. He has never used smokeless tobacco. He reports that he does not currently use alcohol after a past usage  of about 8.0 standard drinks of alcohol per week. He reports that he does not use drugs. Allergies:  Allergies       Allergies  Allergen Reactions   Bee Venom Palpitations   Diclofenac Diarrhea   Amoxicillin Diarrhea and Nausea Only   Lisinopril Cough   Pantoprazole Nausea Only      Other Reaction(s): chest pain            Medications Prior to Admission  Medication Sig Dispense Refill   acetaminophen  (TYLENOL) 325 MG tablet Take 1-2 tablets (325-650 mg total) by mouth every 4 (four) hours as needed for mild pain (pain score 1-3).       amiodarone (PACERONE) 200 MG tablet Take 1 tablet (200 mg total) by mouth 2 (two) times daily. 60 tablet 0   aspirin 81 MG chewable tablet Chew 1 tablet (81 mg total) by mouth daily. 30 tablet 0   digoxin (LANOXIN) 0.125 MG tablet Take 1 tablet (0.125 mg total) by mouth daily. 30 tablet 0   esomeprazole (NEXIUM) 40 MG capsule Take 1 capsule (40 mg total) by mouth 2 (two) times daily before a meal. 60 capsule 5   guaiFENesin (MUCINEX) 600 MG 12 hr tablet Take 1 tablet (600 mg total) by mouth 2 (two) times daily as needed 30 tablet 0   guaiFENesin (MUCINEX) 600 MG 12 hr tablet Take 1 tablet (600 mg total) by mouth 2 (two) times daily as needed. 30 tablet 0   metoprolol succinate (TOPROL-XL) 50 MG 24 hr tablet Take 1 tablet (50 mg total) by mouth daily. Take with or immediately following a meal. 30 tablet 0   nitroGLYCERIN (NITROSTAT) 0.4 MG SL tablet Place 0.4 mg under the tongue every 5 (five) minutes as needed.       rosuvastatin (CRESTOR) 20 MG tablet Take 1 tablet (20 mg total) by mouth daily. 30 tablet 0   spironolactone (ALDACTONE) 25 MG tablet Take 1 tablet (25 mg total) by mouth daily. 30 tablet 0   torsemide (DEMADEX) 20 MG tablet Take 2 tablets (40 mg total) by mouth daily. 30 tablet 0   traZODone (DESYREL) 100 MG tablet Take 1 tablet (100 mg total) by mouth at bedtime as needed for sleep. 30 tablet 0   enoxaparin (LOVENOX) 120 MG/0.8ML injection Inject 0.8 mLs (120 mg total) into the skin every 12 (twelve) hours for 10 doses. 8 mL 0   psyllium (HYDROCIL/METAMUCIL) 95 % PACK Take 1 packet by mouth daily.                  Home: Home Living Family/patient expects to be discharged to:: Private residence Living Arrangements: Alone Available Help at Discharge: Available PRN/intermittently (brother nearby) Type of Home: Apartment Home Access: Stairs to  enter [employer] of Steps: 10 + landing + 10 -  bilateral rails on first flight and landing but on 2nd flight there is only 1 rail on R side Entrance Stairs-Rails: Right, Left Home Layout: Other (Comment) (lives on second floor of apartment building with flight of steps to his unit, has bilateral railings on first set of steps then railing on right for second set of steps) Bathroom Shower/Tub: Tub/shower unit, Engineer, building services: Standard Bathroom Accessibility: No Home Equipment: None Additional Comments: pt reports "My brother is finding me a place to get rehab and then stay. Like assisted living or something."  Lives With: Alone   Functional History: Prior Function Prior Level of Function : Independent/Modified Independent (works at [employer])  Mobility Comments: pt with recent hospital stay follow by short CIR stay and then returned home. Per PT with brother last tuesday 11/26 until monday 12/2 morning, but pt reports he was at his home PTA, unsure which is most accurate ADLs Comments: needed some assist post recent hospital stay   Functional Status:  Mobility: Bed Mobility Overal bed mobility: Needs Assistance Bed Mobility: Sit to Sidelying, Rolling Rolling: Min assist Sidelying to sit: Min assist Sit to sidelying: Mod assist General bed mobility comments: pt received in chair Transfers Overall transfer level: Needs assistance Equipment used: None Transfers: Sit to/from Stand Sit to Stand: Min assist General transfer comment: min A for maintaining fwd shift  to remain standing Ambulation/Gait Ambulation/Gait assistance: Min assist Gait Distance (Feet): 40 Feet Assistive device: Standup Rollator Gait Pattern/deviations: Step-through pattern, Decreased stride length General Gait Details: pt not feeling well with ambulation, increased nausea, pale skin, ambulated 20' and then instructed to turn back to room due to symptoms. Pt had ambulated >200' earlier this AM  when feeling better Gait velocity: decreased Gait velocity interpretation: 1.31 - 2.62 ft/sec, indicative of limited community ambulator   ADL: ADL Overall ADL's : Needs assistance/impaired Eating/Feeding: Modified independent Grooming: Minimal assistance, Standing Upper Body Bathing: Moderate assistance, Sitting Lower Body Bathing: Maximal assistance, Sit to/from stand Upper Body Dressing : Sitting, Maximal assistance Lower Body Dressing: Maximal assistance, Sit to/from stand Toilet Transfer: Ambulation, Minimal assistance (EVA) Functional mobility during ADLs: Minimal assistance, +2 for safety/equipment   Cognition: Cognition Overall Cognitive Status: Impaired/Different from baseline Orientation Level: Oriented X4 Cognition Arousal: Alert Behavior During Therapy: WFL for tasks assessed/performed Overall Cognitive Status: Impaired/Different from baseline Area of Impairment: Safety/judgement, Memory, Awareness, Problem solving, Following commands Current Attention Level: Selective Memory: Decreased short-term memory Following Commands: Follows one step commands consistently, Follows multi-step commands inconsistently Safety/Judgement: Decreased awareness of deficits Awareness: Emergent Problem Solving: Slow processing General Comments: pt distracted by nausea and by inability to have bowel mvmt but able to selectively attend to mobility most of the time   Physical Exam: Blood pressure 105/84, pulse 87, temperature 98.1 F (36.7 C), temperature source Oral, resp. rate 16, height 6\' 2"  (1.88 m), weight 127.8 kg, SpO2 97%. Physical Exam   PE: Constitution: Appropriate appearance for age. No apparent distress +Obese Resp: No respiratory distress. No accessory muscle usage. on RA and CTAB Cardio: Well perfused appearance.  1+ right lower extremity edema, 2+ left lower extremity edema Abdomen: Mildly distended, nontender, normoactive bowel sounds. Psych: Appropriate mood and  affect. Skin: + Sternal incision glued, well-approximated, healing + Abdominal incisions with sutures intact, well-approximated, no apparent drainage.  Covered in Mepilex. + Left lower extremity incisions with sutures intact, well-approximated, mild warmth and edema surrounding but no erythema or active drainage. + Right back laceration as below   Neuro: AAOx4. No apparent cognitive deficits  DTRs: Reflexes were 2+ in bilateral achilles, patella, biceps, BR and triceps. Sensory exam: revealed normal sensation in all dermatomal regions in bilateral upper extremities and bilateral lower extremities Motor exam: strength 5/5 throughout bilateral upper extremities, bilateral lower extremities, and with exception of 5- out of 5 in bilateral hip flexors while maintaining upper body range of motion restrictions. Coordination: Fine motor coordination was normal.       Lab Results Last 48 Hours        Results for orders placed or performed during the hospital encounter of 12/31/22 (from the past 48 hour(s))  Glucose, capillary     Status: Abnormal  Collection Time: 01/01/23  4:17 PM  Result Value Ref Range    Glucose-Capillary 149 (H) 70 - 99 mg/dL      Comment: Glucose reference range applies only to samples taken after fasting for at least 8 hours.  Basic metabolic panel     Status: Abnormal    Collection Time: 01/01/23  4:48 PM  Result Value Ref Range    Sodium 130 (L) 135 - 145 mmol/L    Potassium 4.2 3.5 - 5.1 mmol/L    Chloride 99 98 - 111 mmol/L    CO2 22 22 - 32 mmol/L    Glucose, Bld 178 (H) 70 - 99 mg/dL      Comment: Glucose reference range applies only to samples taken after fasting for at least 8 hours.    BUN 9 8 - 23 mg/dL    Creatinine, Ser 4.09 0.61 - 1.24 mg/dL    Calcium 8.4 (L) 8.9 - 10.3 mg/dL    GFR, Estimated >81 >19 mL/min      Comment: (NOTE) Calculated using the CKD-EPI Creatinine Equation (2021)      Anion gap 9 5 - 15      Comment: Performed at [facility], [address] [place], [place] [address]  Magnesium     Status: None    Collection Time: 01/01/23  4:48 PM  Result Value Ref Range    Magnesium 2.2 1.7 - 2.4 mg/dL      Comment: Performed at [facility], [address], [place], [place] [address]  CBC     Status: Abnormal    Collection Time: 01/01/23  4:48 PM  Result Value Ref Range    WBC 9.1 4.0 - 10.5 K/uL    RBC 2.77 (L) 4.22 - 5.81 MIL/uL    Hemoglobin 8.9 (L) 13.0 - 17.0 g/dL    HCT 30.8 (L) 65.7 - 52.0 %    MCV 98.6 80.0 - 100.0 fL    MCH 32.1 26.0 - 34.0 pg    MCHC 32.6 30.0 - 36.0 g/dL    RDW 84.6 96.2 - 95.2 %    Platelets 175 150 - 400 K/uL    nRBC 0.0 0.0 - 0.2 %      Comment: Performed at [facility], [address], [place], [place] [address]  Glucose, capillary     Status: Abnormal    Collection Time: 01/01/23  7:53 PM  Result Value Ref Range    Glucose-Capillary 118 (H) 70 - 99 mg/dL      Comment: Glucose reference range applies only to samples taken after fasting for at least 8 hours.  Glucose, capillary     Status: Abnormal    Collection Time: 01/01/23 11:14 PM  Result Value Ref Range    Glucose-Capillary 105 (H) 70 - 99 mg/dL      Comment: Glucose reference range applies only to samples taken after fasting for at least 8 hours.  Glucose, capillary     Status: Abnormal    Collection Time: 01/02/23  3:19 AM  Result Value Ref Range    Glucose-Capillary 121 (H) 70 - 99 mg/dL      Comment: Glucose reference range applies only to samples taken after fasting for at least 8 hours.  Basic metabolic panel     Status: Abnormal    Collection Time: 01/02/23  4:32 AM  Result Value Ref Range    Sodium 127 (L) 135 - 145 mmol/L    Potassium  4.0 3.5 - 5.1 mmol/L    Chloride 98 98 - 111 mmol/L    CO2 24 22 - 32 mmol/L    Glucose, Bld 124 (H) 70 - 99 mg/dL      Comment: Glucose reference range applies only to samples taken after fasting for at least 8 hours.    BUN 9 8 - 23 mg/dL    Creatinine, Ser  1.61 0.61 - 1.24 mg/dL    Calcium 8.1 (L) 8.9 - 10.3 mg/dL    GFR, Estimated >09 >60 mL/min      Comment: (NOTE) Calculated using the CKD-EPI Creatinine Equation (2021)      Anion gap 5 5 - 15      Comment: Performed at [facility], [address] [place], [place] [address]  CBC     Status: Abnormal    Collection Time: 01/02/23  4:32 AM  Result Value Ref Range    WBC 7.7 4.0 - 10.5 K/uL    RBC 2.52 (L) 4.22 - 5.81 MIL/uL    Hemoglobin 8.0 (L) 13.0 - 17.0 g/dL    HCT 81.1 (L) 91.4 - 52.0 %    MCV 98.0 80.0 - 100.0 fL    MCH 31.7 26.0 - 34.0 pg    MCHC 32.4 30.0 - 36.0 g/dL    RDW 78.2 95.6 - 21.3 %    Platelets 136 (L) 150 - 400 K/uL      Comment: REPEATED TO VERIFY    nRBC 0.0 0.0 - 0.2 %      Comment: Performed at [facility], [address], [place], [place] [address]  Glucose, capillary     Status: Abnormal    Collection Time: 01/02/23  7:32 AM  Result Value Ref Range    Glucose-Capillary 188 (H) 70 - 99 mg/dL      Comment: Glucose reference range applies only to samples taken after fasting for at least 8 hours.  Glucose, capillary     Status: Abnormal    Collection Time: 01/02/23 11:18 AM  Result Value Ref Range    Glucose-Capillary 140 (H) 70 - 99 mg/dL      Comment: Glucose reference range applies only to samples taken after fasting for at least 8 hours.  Glucose, capillary     Status: Abnormal    Collection Time: 01/02/23  3:58 PM  Result Value Ref Range    Glucose-Capillary 118 (H) 70 - 99 mg/dL      Comment: Glucose reference range applies only to samples taken after fasting for at least 8 hours.  Glucose, capillary     Status: Abnormal    Collection Time: 01/02/23  9:35 PM  Result Value Ref Range    Glucose-Capillary 114 (H) 70 - 99 mg/dL      Comment: Glucose reference range applies only to samples taken after fasting for at least 8 hours.  Basic metabolic panel     Status: Abnormal    Collection Time: 01/03/23  2:43 AM  Result Value Ref Range     Sodium 129 (L) 135 - 145 mmol/L    Potassium 3.8 3.5 - 5.1 mmol/L      Comment: HEMOLYSIS AT THIS LEVEL MAY AFFECT RESULT    Chloride 93 (L) 98 - 111 mmol/L    CO2 26 22 - 32 mmol/L    Glucose, Bld 119 (H) 70 - 99 mg/dL      Comment: Glucose reference range applies only to samples taken after fasting for  at least 8 hours.    BUN 13 8 - 23 mg/dL    Creatinine, Ser 1.61 0.61 - 1.24 mg/dL    Calcium 8.5 (L) 8.9 - 10.3 mg/dL    GFR, Estimated >09 >60 mL/min      Comment: (NOTE) Calculated using the CKD-EPI Creatinine Equation (2021)      Anion gap 10 5 - 15      Comment: Performed at [facility], [address] [place], [place] [address]  CBC     Status: Abnormal    Collection Time: 01/03/23  2:43 AM  Result Value Ref Range    WBC 8.1 4.0 - 10.5 K/uL    RBC 2.44 (L) 4.22 - 5.81 MIL/uL    Hemoglobin 7.5 (L) 13.0 - 17.0 g/dL    HCT 81.1 (L) 91.4 - 52.0 %    MCV 95.1 80.0 - 100.0 fL    MCH 30.7 26.0 - 34.0 pg    MCHC 32.3 30.0 - 36.0 g/dL    RDW 78.2 95.6 - 21.3 %    Platelets 146 (L) 150 - 400 K/uL    nRBC 0.0 0.0 - 0.2 %      Comment: Performed at [facility], [address] [place], [place] [address]  Glucose, capillary     Status: Abnormal    Collection Time: 01/03/23  6:44 AM  Result Value Ref Range    Glucose-Capillary 116 (H) 70 - 99 mg/dL      Comment: Glucose reference range applies only to samples taken after fasting for at least 8 hours.  Glucose, capillary     Status: Abnormal    Collection Time: 01/03/23 11:29 AM  Result Value Ref Range    Glucose-Capillary 105 (H) 70 - 99 mg/dL      Comment: Glucose reference range applies only to samples taken after fasting for at least 8 hours.       Imaging Results (Last 48 hours)  DG CHEST PORT 1 VIEW   Result Date: 01/03/2023 CLINICAL DATA:  Status post CABG . EXAM: PORTABLE CHEST 1 VIEW COMPARISON:  01/02/2023 FINDINGS: Left lung base is not been included on the film. Left chest tube seen previously has  probably been removed in the interval. Mediastinal/pericardial drain seen at the midline on the prior study is not evident today. No evidence for pneumothorax interstitial markings remain coarsened with probable atelectasis at the left base. Potential small left effusion. The cardio pericardial silhouette is enlarged. Bones are diffusely demineralized. Telemetry leads overlie the chest. IMPRESSION: 1. Left lung base has not been included on the film. 2. Interval removal of left chest tube and mediastinal/pericardial drain. No evidence for pneumothorax. 3. Persistent coarsening of interstitial markings with probable atelectasis at the left base and potential small left effusion. Electronically Signed   By: Kennith Center M.D.   On: 01/03/2023 10:47    DG Chest Port 1 View   Result Date: 01/02/2023 CLINICAL DATA:  Status post coronary artery bypass graft. EXAM: PORTABLE CHEST 1 VIEW COMPARISON:  January 01, 2023. FINDINGS: Stable cardiomegaly. No definite pneumothorax is noted. Minimal to mild bibasilar subsegmental atelectasis is noted with probable small pleural effusions. Bony thorax is unremarkable. Left-sided chest tube is unchanged. Swan-Ganz catheter has been removed. IMPRESSION: Stable left-sided chest tube without pneumothorax. Minimal to mild bibasilar subsegmental atelectasis with probable small pleural effusions. Electronically Signed   By: Lupita Raider M.D.   On: 01/02/2023 10:22  Blood pressure 105/84, pulse 87, temperature 98.1 F (36.7 C), temperature source Oral, resp. rate 16, height 6\' 2"  (1.88 m), weight 127.8 kg, SpO2 97%.   Medical Problem List and Plan: 1. Functional deficits secondary to cardiac debility status post CABG             -patient may not shower unless abdominal and LLE incisions covered             -ELOS/Goals: 7 to 10 days, modified independent PT/OT   2.  Antithrombotics: -DVT/anticoagulation:  Pharmaceutical: Eliquis             -antiplatelet  therapy: Aspirin 81 mg daily             - LLE duplex on admission for increasing edema   3. Pain Management: Tylenol, tramadol as needed   4. Mood/Behavior/Sleep: LCSW to evaluate and provide emotional support             -trazodone 100 mg q HS prn             -antipsychotic agents: n/a   5. Neuropsych/cognition: This patient IS capable of making decisions on HIS own behalf.   6. Skin/Wound Care: Routine skin care checks   7. Fluids/Electrolytes/Nutrition: Strict Is and Os and follow-up chemistries   8: Hypertension: monitor TID and prn (see #10 meds below)    9: Hyperlipidemia: continue statin   10: Acute systolic CHF (cardiogenic shock>>resolved)             -daily weight             -continue digoxin 0.125 mg daily             -continue spironolactone 25 mg daily                         -continue torsemide 40 mg daily             -continue amiodarone 200 mg BID   11: CAD:             -s/p CABG 12/02             -follow-up with Dr. Laneta Simmers             -continue asa 325 mg daily   12: Atrial fibrillation:             -on Eliquis 5 mg BID             -continue digoxin 0.125 mg daily             -continue amiodarone 200 mg BID   13: Obesity: BMI = 36.17   14: ABLA: continue Trigels-F Forte daily  -follow-up CBC   15: Hyponatremia: trending up slowly -follow-up BMP   16: Thrombocytopenia: improving, no bleeding; follow-up CBC   17: Hypokalemia: Klor-Con 20 mEq BID   18: Nausea: Reglan 10 mg TID   19: Constipation: continue Colace 200 mg daily, Dulcolax tab daily; prn meds ordered   20: GERD?: Protonix 80 mg  (home on Nexium 40 mg BID)   21: Cough?: on Mucinex 600 mg BID   22. Constipation/nausea. LBM 2 days ago. Add sennakot s 1 tab at bedtime. Has PRN sorbitol.    Milinda Antis, PA-C 01/03/2023  I have examined the patient independently and edited the note for HPI, ROS, exam, assessment, and plan as appropriate. I am in agreement with the above  recommendations.   Angelina Sheriff,  DO 01/04/2023

## 2023-01-05 LAB — CBC WITH DIFFERENTIAL/PLATELET
Abs Immature Granulocytes: 0.01 10*3/uL (ref 0.00–0.07)
Basophils Absolute: 0 10*3/uL (ref 0.0–0.1)
Basophils Relative: 1 %
Eosinophils Absolute: 0.2 10*3/uL (ref 0.0–0.5)
Eosinophils Relative: 4 %
HCT: 21.6 % — ABNORMAL LOW (ref 39.0–52.0)
Hemoglobin: 7.3 g/dL — ABNORMAL LOW (ref 13.0–17.0)
Immature Granulocytes: 0 %
Lymphocytes Relative: 20 %
Lymphs Abs: 1.3 10*3/uL (ref 0.7–4.0)
MCH: 31.6 pg (ref 26.0–34.0)
MCHC: 33.8 g/dL (ref 30.0–36.0)
MCV: 93.5 fL (ref 80.0–100.0)
Monocytes Absolute: 0.9 10*3/uL (ref 0.1–1.0)
Monocytes Relative: 15 %
Neutro Abs: 3.7 10*3/uL (ref 1.7–7.7)
Neutrophils Relative %: 60 %
Platelets: 209 10*3/uL (ref 150–400)
RBC: 2.31 MIL/uL — ABNORMAL LOW (ref 4.22–5.81)
RDW: 13.5 % (ref 11.5–15.5)
WBC: 6.2 10*3/uL (ref 4.0–10.5)
nRBC: 0 % (ref 0.0–0.2)

## 2023-01-05 LAB — COMPREHENSIVE METABOLIC PANEL
ALT: 18 U/L (ref 0–44)
AST: 17 U/L (ref 15–41)
Albumin: 2.6 g/dL — ABNORMAL LOW (ref 3.5–5.0)
Alkaline Phosphatase: 57 U/L (ref 38–126)
Anion gap: 8 (ref 5–15)
BUN: 9 mg/dL (ref 8–23)
CO2: 29 mmol/L (ref 22–32)
Calcium: 8.2 mg/dL — ABNORMAL LOW (ref 8.9–10.3)
Chloride: 92 mmol/L — ABNORMAL LOW (ref 98–111)
Creatinine, Ser: 0.73 mg/dL (ref 0.61–1.24)
GFR, Estimated: >60 mL/min (ref >60)
Glucose, Bld: 99 mg/dL (ref 70–99)
Potassium: 3 mmol/L — ABNORMAL LOW (ref 3.5–5.1)
Sodium: 129 mmol/L — ABNORMAL LOW (ref 135–145)
Total Bilirubin: 1.1 mg/dL (ref <1.2)
Total Protein: 5.7 g/dL — ABNORMAL LOW (ref 6.5–8.1)

## 2023-01-05 MED ORDER — POTASSIUM CHLORIDE CRYS ER 20 MEQ PO TBCR
40.0000 meq | EXTENDED_RELEASE_TABLET | Freq: Two times a day (BID) | ORAL | Status: AC
  Administered 2023-01-05 (×2): 40 meq via ORAL
  Filled 2023-01-05 (×2): qty 2

## 2023-01-05 MED ORDER — POLYETHYLENE GLYCOL 3350 17 G PO PACK
17.0000 g | PACK | Freq: Two times a day (BID) | ORAL | Status: DC
  Administered 2023-01-05 – 2023-01-14 (×19): 17 g via ORAL
  Filled 2023-01-05 (×19): qty 1

## 2023-01-05 NOTE — Progress Notes (Signed)
Inpatient Rehabilitation Admission Medication Review by a Pharmacist  A complete drug regimen review was completed for this patient to identify any potential clinically significant medication issues.  High Risk Drug Classes Is patient taking? Indication by Medication  Antipsychotic No   Anticoagulant Yes Apixaban - a fib and possible PE  Antibiotic No   Opioid No   Antiplatelet Yes ASA - CAD  Hypoglycemics/insulin No   Vasoactive Medication Yes Amiodarone - a fib Digoxin - a fib Metoprolol - afib Spironolactone - CHF Torsemide - CHF  Chemotherapy No   Other Yes Bisacodyl - constipation Trigels Forte - supplementation Mucinex - congestion Pantoprazole - GERD Crestor - hyper lipidemia Ondansetron - nausea/vomiting Tramadol - pain Trazadone - sleep     Type of Medication Issue Identified Description of Issue Recommendation(s)  Drug Interaction(s) (clinically significant)     Duplicate Therapy     Allergy     No Medication Administration End Date     Incorrect Dose     Additional Drug Therapy Needed     Significant med changes from prior encounter (inform family/care partners about these prior to discharge).    Other       Clinically significant medication issues were identified that warrant physician communication and completion of prescribed/recommended actions by midnight of the next day:  No  Name of provider notified for urgent issues identified:   Provider Method of Notification:     Pharmacist comments:   Time spent performing this drug regimen review (minutes):  10   Jeanella Cara, PharmD, [place] Clinical Pharmacist Please see AMION for all Pharmacists' Contact Phone Numbers 01/05/2023, 8:07 AM

## 2023-01-05 NOTE — Evaluation (Signed)
Physical Therapy Assessment and Plan  Patient Details  Name: Jeremiah Dixon MRN: [id number] Date of Birth: [birth year]  PT Diagnosis: Difficulty walking and Edema Rehab Potential: Good ELOS: 7-10 days   Today's Date: 01/05/2023 PT Individual Time: 1045-1200 + [id number] PT Individual Time Calculation (min): 75 min  + 55 min  Hospital Problem: Principal Problem:   Debility   Past Medical History:  Past Medical History:  Diagnosis Date   Atrial fibrillation (HCC)    CHF (congestive heart failure) (HCC)    Coronary artery disease    Dysrhythmia    A. Fib   HLD (hyperlipidemia)    HTN (hypertension)    MI (myocardial infarction) [facility])    Past Surgical History:  Past Surgical History:  Procedure Laterality Date   CARDIOVERSION N/A 12/14/2022   Procedure: CARDIOVERSION (CATH LAB);  Surgeon: Dolores Patty, MD;  Location: [facility] INVASIVE CV LAB;  Service: Cardiovascular;  Laterality: N/A;   CLIPPING OF ATRIAL APPENDAGE N/A 12/31/2022   Procedure: CLIPPING OF ATRIAL APPENDAGE WITH PULMONARY VEIN ISOLATION;  Surgeon: [name], MD;  Location: [facility] OR;  Service: Open Heart Surgery;  Laterality: N/A;   CORONARY ARTERY BYPASS GRAFT N/A 12/31/2022   Procedure: CORONARY ARTERY BYPASS GRAFTING X 3, USING LEFT INTERNAL MAMMARY ARTERY AND OPEN HARVESTED LEFT SAPHENOUS VEIN GRAFT, ATTEMPTED ENDOSCOPIC RIGHT SAPHENOUS VEIN HARVEST;  Surgeon: [name], MD;  Location: [facility] OR;  Service: Open Heart Surgery;  Laterality: N/A;   RIGHT HEART CATH N/A 12/06/2022   Procedure: RIGHT HEART CATH;  Surgeon: Laurey Morale, MD;  Location: [facility] INVASIVE CV LAB;  Service: Cardiovascular;  Laterality: N/A;   RIGHT/LEFT HEART CATH AND CORONARY ANGIOGRAPHY N/A 12/10/2022   Procedure: RIGHT/LEFT HEART CATH AND CORONARY ANGIOGRAPHY;  Surgeon: Dolores Patty, MD;  Location: [facility] INVASIVE CV LAB;  Service: Cardiovascular;  Laterality: N/A;   TEE WITHOUT CARDIOVERSION N/A 12/31/2022   Procedure:  TRANSESOPHAGEAL ECHOCARDIOGRAM;  Surgeon: [name], MD;  Location: [facility] OR;  Service: Open Heart Surgery;  Laterality: N/A;   TRANSESOPHAGEAL ECHOCARDIOGRAM (CATH LAB) N/A 12/14/2022   Procedure: TRANSESOPHAGEAL ECHOCARDIOGRAM;  Surgeon: Dolores Patty, MD;  Location: [facility] INVASIVE CV LAB;  Service: Cardiovascular;  Laterality: N/A;    Assessment & Plan Clinical Impression: Patient is a 62 year old male who is s/p CABG times 3 vessels, pulmonary vein isolation and LAA clipping by Dr. Laneta Simmers on 12/31/2022. He was recently admitted 11/07 secondary to PEA arrest, underwent cardiac catheterization and plan for CABG. Started long-term anti-coagulation for atrial fibrillation. He was discharged and admitted to CIR on 11/20. Discharge home on 11/26. He has done well post-operatively. Extubated, chest tubes removed and ambulating with PT. Diuresis continued. Constipation addressed. Plan resuming Eliquis at discharge. The patient requires inpatient medicine and rehabilitation evaluations and services for ongoing dysfunction secondary to cardiac disability.   Patient transferred to CIR on 01/04/2023 .   Patient currently requires min with mobility secondary to  decreased ambulation endurance .  Prior to hospitalization, patient was independent  with mobility and lived with Alone in a Apartment home.  Home access is 7+ landing, then 7+; bilateral rails on first flight and landing but on 2nd flight there is only 1 rail on R sideStairs to enter.  Patient will benefit from skilled PT intervention to maximize safe functional mobility, minimize fall risk, and decrease caregiver burden for planned discharge home with intermittent assist.  Anticipate patient will benefit from follow up OP at discharge.  PT -  End of Session Activity Tolerance: Tolerates 30+ min activity with multiple rests Endurance Deficit: Yes Endurance Deficit Description: Fluctuating orthostatic symptoms with positional changes. PT  Assessment Rehab Potential (ACUTE/IP ONLY): Good PT Barriers to Discharge: Inaccessible home environment;Decreased caregiver support;Home environment access/layout PT Barriers to Discharge Comments: 2 flights of steps to enter apartment and lives alone PT Patient demonstrates impairments in the following area(s): Balance;Edema;Endurance;Pain;Safety;Skin Integrity PT Transfers Functional Problem(s): Bed Mobility;Bed to Chair;Car PT Locomotion Functional Problem(s): Ambulation;Stairs PT Plan PT Intensity: Minimum of 1-2 x/day ,45 to 90 minutes PT Frequency: 5 out of 7 days PT Duration Estimated Length of Stay: 7-10 days PT Treatment/Interventions: Ambulation/gait training;Discharge planning;Functional mobility training;Psychosocial support;Therapeutic Activities;Balance/vestibular training;Disease management/prevention;Neuromuscular re-education;Skin care/wound management;Therapeutic Exercise;DME/adaptive equipment instruction;Pain management;UE/LE Strength taining/ROM;Community reintegration;Patient/family education;Stair training;UE/LE Coordination activities PT Transfers Anticipated Outcome(s): modI PT Locomotion Anticipated Outcome(s): modI PT Recommendation Follow Up Recommendations: Outpatient PT Patient destination: Home Equipment Recommended: To be determined Equipment Details: may need RW for short term   PT Evaluation Precautions/Restrictions Precautions Precautions: Fall;Sternal Precaution Comments: Verbal review of sternal precautions Restrictions Weight Bearing Restrictions: No RUE Weight Bearing: Non weight bearing LUE Weight Bearing: Non weight bearing Other Position/Activity Restrictions: Sternal Precautions General Chart Reviewed: Yes Family/Caregiver Present: No Vital SignsTherapy Vitals Patient Position (if appropriate): Orthostatic Vitals Oxygen Therapy SpO2: 97 % O2 Device: Room Air Pain Pain Assessment Pain Scale: 0-10 Pain Score: 0-No pain Pain Type:  Acute pain;Surgical pain Pain Location: Sternum Pain Interference Pain Interference Pain Effect on Sleep: 1. Rarely or not at all Pain Interference with Therapy Activities: 1. Rarely or not at all Pain Interference with Day-to-Day Activities: 1. Rarely or not at all Home Living/Prior Functioning Home Living Available Help at Discharge: Available PRN/intermittently Type of Home: Apartment Home Access: Stairs to enter Entrance Stairs-Number of Steps: 7+ landing, then 7+; bilateral rails on first flight and landing but on 2nd flight there is only 1 rail on R side Entrance Stairs-Rails: Right;Left Home Layout: One level Bathroom Shower/Tub: Tub/shower unit;Curtain Firefighter: Standard Bathroom Accessibility: No  Lives With: Alone Prior Function Level of Independence: Independent with basic ADLs;Independent with transfers;Independent with gait;Independent with homemaking with ambulation  Able to Take Stairs?: Yes Driving: No Vocation: Part time employment Vocation Requirements: Part-time at [employer] Leisure: Hobbies-yes (Comment) Vision/Perception  Vision - History Ability to See in Adequate Light: 0 Adequate Perception Perception: Within Functional Limits Praxis Praxis: WFL  Cognition Overall Cognitive Status: Within Functional Limits for tasks assessed Arousal/Alertness: Awake/alert Orientation Level: Oriented X4 Memory: Appears intact Safety/Judgment: Appears intact Sensation Sensation Light Touch: Appears Intact Hot/Cold: Appears Intact Proprioception: Appears Intact Stereognosis: Appears Intact Coordination Gross Motor Movements are Fluid and Coordinated: No Fine Motor Movements are Fluid and Coordinated: Yes Coordination and Movement Description: Decreased due to post-op debility. Motor  Motor Motor: Within Functional Limits   Trunk/Postural Assessment  Cervical Assessment Cervical Assessment: Within Functional Limits Thoracic Assessment Thoracic  Assessment: Within Functional Limits Lumbar Assessment Lumbar Assessment: Within Functional Limits Postural Control Postural Control: Within Functional Limits  Balance Balance Balance Assessed: Yes Static Sitting Balance Static Sitting - Balance Support: Feet unsupported Static Sitting - Level of Assistance: 7: Independent Dynamic Sitting Balance Dynamic Sitting - Balance Support: Feet unsupported;During functional activity Dynamic Sitting - Level of Assistance: 7: Independent Dynamic Sitting - Balance Activities: Reaching for objects;Forward lean/weight shifting Static Standing Balance Static Standing - Balance Support: No upper extremity supported;During functional activity Static Standing - Level of Assistance: 5: Stand by assistance Dynamic Standing Balance Dynamic Standing - Balance Support:  No upper extremity supported;During functional activity Dynamic Standing - Level of Assistance: 5: Stand by assistance Extremity Assessment  RUE Assessment RUE Assessment: Exceptions to [facility] General Strength Comments: Limited by sternal precautions LUE Assessment LUE Assessment: Exceptions to [facility] General Strength Comments: Limited by sternal precautions RLE Assessment RLE Assessment: Within Functional Limits LLE Assessment LLE Assessment: Within Functional Limits  Care Tool Care Tool Bed Mobility Roll left and right activity   Roll left and right assist level: Minimal Assistance - Patient > 75%    Sit to lying activity   Sit to lying assist level: Minimal Assistance - Patient > 75%    Lying to sitting on side of bed activity   Lying to sitting on side of bed assist level: the ability to move from lying on the back to sitting on the side of the bed with no back support.: Minimal Assistance - Patient > 75%     Care Tool Transfers Sit to stand transfer   Sit to stand assist level: Minimal Assistance - Patient > 75%    Chair/bed transfer   Chair/bed transfer assist level: Minimal  Assistance - Patient > 75%    Car transfer   Car transfer assist level: Minimal Assistance - Patient > 75%      Care Tool Locomotion Ambulation   Assist level: Minimal Assistance - Patient > 75% Assistive device: Walker-rolling Max distance: 200  Walk 10 feet activity   Assist level: Minimal Assistance - Patient > 75% Assistive device: Walker-rolling   Walk 50 feet with 2 turns activity   Assist level: Minimal Assistance - Patient > 75% Assistive device: Walker-rolling  Walk 150 feet activity   Assist level: Minimal Assistance - Patient > 75% Assistive device: Walker-rolling  Walk 10 feet on uneven surfaces activity   Assist level: Minimal Assistance - Patient > 75% Assistive device: Optician, dispensing Stair activity did not occur: Safety/medical concerns (limited 2/2 endurance deficit)        Walk up/down 1 step activity Walk up/down 1 step or curb (drop down) activity did not occur: Safety/medical concerns (limited 2/2 endurance deficit)      Walk up/down 4 steps activity Walk up/down 4 steps activity did not occur: Safety/medical concerns (limited 2/2 endurance deficit)      Walk up/down 12 steps activity Walk up/down 12 steps activity did not occur: Safety/medical concerns (limited 2/2 endurance deficit)      Pick up small objects from floor Pick up small object from the floor (from standing position) activity did not occur: Safety/medical concerns (limited 2/2 endurance deficit)      Wheelchair Is the patient using a wheelchair?: Yes     Wheelchair assist level: Dependent - Patient 0% Max wheelchair distance: 0  Wheel 50 feet with 2 turns activity   Assist Level: Dependent - Patient 0%  Wheel 150 feet activity Wheelchair 150 feet activity did not occur:  (limited 2/2 endurance deficit) Assist Level: Dependent - Patient 0%    Refer to Care Plan for Long Term Goals  SHORT TERM GOAL WEEK 1 PT Short Term Goal 1 (Week 1): STG=LTG due to LOS  Recommendations  for other services: None   Skilled Therapeutic Intervention Mobility Bed Mobility Bed Mobility: Rolling Right;Rolling Left;Supine to Sit;Sit to Supine Rolling Right: Independent Rolling Left: Independent Supine to Sit: Minimal Assistance - Patient > 75% Sit to Supine: Minimal Assistance - Patient > 75% Transfers Transfers: Sit to Stand;Stand to Sit Sit to Stand: Minimal Assistance - Patient > 75%  Stand to Sit: Contact Guard/Touching assist Stand Pivot Transfers: Contact Guard/Touching assist Transfer (Assistive device): None Locomotion  Gait Ambulation: Yes Gait Assistance: Minimal Assistance - Patient > 75% Gait Distance (Feet): 100 Feet Assistive device: Rolling walker Gait Gait: Yes Gait velocity: decreased  Session 1: Chart reviewed and pt agreeable to therapy. Pt received seated in recliner with no c/o pain. Also of note, pt reported previous lightheadedness and dizziness. Symptoms monitored t/o session. Pt experienced periodic symptoms with positional change that consistently resolved under 1 min. Session focused on evaluation and initiation of amb training to promote safe home access. Pt initiated session with evaluation as described above. Pt then completed amb of 275ft using CGA + RW and amb to toilet/recliner with same assist level. Session education emphasized therapy goals, role of therapy, care continuum, and CIR fall prevention. At end of session, pt was left seated in recliner with alarm engaged, nurse call bell and all needs in reach.  Session 2: Chart reviewed and pt agreeable to therapy. Pt received seated in recliner with no c/o pain. Session focused on initiation to stair training and practice of functional transfers to promote safe home access. Pt initiated session with stand step transfer to WC using MinA + RW. Pt then waken to therapy gym for time management. In gym, pt completed 4 steps using R hand rail and side step pattern with MinA progressing to CGA. Pt then  completed blocked practice of 4 step up/downs using R rail then L rail and side step pattern for conditioning practice. Pt noted to fatigue after 4 steps each set. Pt then completed car transfer using close sup + RW to sit in car and MinA for LLE management. Pt then returned to room for toileting. Pt amb WC>recliner>toilet using CGA + no AD. Pt displayed good safety judgement to manage any OH symptoms before locomotion. Session education emphasized sternal precautions. At end of session, pt was left seated in recliner with alarm engaged, nurse call bell and all needs in reach.        Discharge Criteria: Patient will be discharged from PT if patient refuses treatment 3 consecutive times without medical reason, if treatment goals not met, if there is a change in medical status, if patient makes no progress towards goals or if patient is discharged from hospital.  The above assessment, treatment plan, treatment alternatives and goals were discussed and mutually agreed upon: by patient  Dionne Milo 01/05/2023, 12:18 PM

## 2023-01-05 NOTE — Evaluation (Signed)
Occupational Therapy Assessment and Plan  Patient Details  Name: Jeremiah Dixon MRN: [id number] Date of Birth: 05/21/1960  OT Diagnosis: acute pain, muscle weakness (generalized), and decreased activity tolerance Rehab Potential: Rehab Potential (ACUTE ONLY): Good ELOS: 7-10 days   Today's Date: 01/05/2023 OT Individual Time: 7829-5621 OT Individual Time Calculation (min): 70 min     Hospital Problem: Principal Problem:   Debility  Past Medical History:  Past Medical History:  Diagnosis Date   Atrial fibrillation (HCC)    CHF (congestive heart failure) (HCC)    Coronary artery disease    Dysrhythmia    A. Fib   HLD (hyperlipidemia)    HTN (hypertension)    MI (myocardial infarction) [facility])    Past Surgical History:  Past Surgical History:  Procedure Laterality Date   CARDIOVERSION N/A 12/14/2022   Procedure: CARDIOVERSION (CATH LAB);  Surgeon: Dolores Patty, MD;  Location: [facility] INVASIVE CV LAB;  Service: Cardiovascular;  Laterality: N/A;   CLIPPING OF ATRIAL APPENDAGE N/A 12/31/2022   Procedure: CLIPPING OF ATRIAL APPENDAGE WITH PULMONARY VEIN ISOLATION;  Surgeon: [name], MD;  Location: [facility] OR;  Service: Open Heart Surgery;  Laterality: N/A;   CORONARY ARTERY BYPASS GRAFT N/A 12/31/2022   Procedure: CORONARY ARTERY BYPASS GRAFTING X 3, USING LEFT INTERNAL MAMMARY ARTERY AND OPEN HARVESTED LEFT SAPHENOUS VEIN GRAFT, ATTEMPTED ENDOSCOPIC RIGHT SAPHENOUS VEIN HARVEST;  Surgeon: [name], MD;  Location: [facility] OR;  Service: Open Heart Surgery;  Laterality: N/A;   RIGHT HEART CATH N/A 12/06/2022   Procedure: RIGHT HEART CATH;  Surgeon: Laurey Morale, MD;  Location: [facility] INVASIVE CV LAB;  Service: Cardiovascular;  Laterality: N/A;   RIGHT/LEFT HEART CATH AND CORONARY ANGIOGRAPHY N/A 12/10/2022   Procedure: RIGHT/LEFT HEART CATH AND CORONARY ANGIOGRAPHY;  Surgeon: Dolores Patty, MD;  Location: [facility] INVASIVE CV LAB;  Service: Cardiovascular;  Laterality: N/A;    TEE WITHOUT CARDIOVERSION N/A 12/31/2022   Procedure: TRANSESOPHAGEAL ECHOCARDIOGRAM;  Surgeon: [name], MD;  Location: [facility] OR;  Service: Open Heart Surgery;  Laterality: N/A;   TRANSESOPHAGEAL ECHOCARDIOGRAM (CATH LAB) N/A 12/14/2022   Procedure: TRANSESOPHAGEAL ECHOCARDIOGRAM;  Surgeon: Dolores Patty, MD;  Location: [facility] INVASIVE CV LAB;  Service: Cardiovascular;  Laterality: N/A;    Assessment & Plan Clinical Impression: Patient is a 62 year old male who is s/p CABG times 3 vessels, pulmonary vein isolation and LAA clipping by Dr. Laneta Simmers on 12/31/2022. He was recently admitted 11/07 secondary to PEA arrest, underwent cardiac catheterization and plan for CABG. Started long-term anti-coagulation for atrial fibrillation. He was discharged and admitted to CIR on 11/20. Discharge home on 11/26. He has done well post-operatively. Extubated, chest tubes removed and ambulating with PT. Diuresis continued. Constipation addressed. Plan resuming Eliquis at discharge.  Patient transferred to CIR on 01/04/2023 .    Patient currently requires min with basic self-care skills secondary to muscle weakness, decreased cardiorespiratoy endurance, and decreased standing balance and decreased balance strategies.  Prior to hospitalization, patient could complete BADLs/IADLs independently.   Patient will benefit from skilled intervention to increase independence with basic self-care skills and increase level of independence with iADL prior to discharge home independently.  Anticipate patient will require intermittent supervision and follow up home health.  OT - End of Session Activity Tolerance: Tolerates < 10 min activity with changes in vital signs Endurance Deficit: Yes Endurance Deficit Description: Fluctuating orthostatic symptoms with positional changes. OT Assessment Rehab Potential (ACUTE ONLY): Good OT Barriers to Discharge: Decreased  caregiver support;Wound Care;Lack of/limited family  support;Weight;Weight bearing restrictions OT Patient demonstrates impairments in the following area(s): Balance;Endurance;Pain;Safety;Skin Integrity OT Basic ADL's Functional Problem(s): Bathing;Dressing;Toileting OT Advanced ADL's Functional Problem(s): Simple Meal Preparation;Light Housekeeping OT Transfers Functional Problem(s): Toilet;Tub/Shower OT Plan OT Intensity: Minimum of 1-2 x/day, 45 to 90 minutes OT Frequency: 5 out of 7 days OT Duration/Estimated Length of Stay: 7-10 days OT Treatment/Interventions: Balance/vestibular training;Discharge planning;Pain management;Self Care/advanced ADL retraining;Therapeutic Activities;Disease mangement/prevention;Functional mobility training;Patient/family education;Skin care/wound managment;Therapeutic Exercise;Community reintegration;DME/adaptive equipment instruction;Neuromuscular re-education;Psychosocial support;UE/LE Strength taining/ROM;Splinting/orthotics OT Basic Self-Care Anticipated Outcome(s): Mod I OT Toileting Anticipated Outcome(s): Mod I OT Bathroom Transfers Anticipated Outcome(s): Mod I OT Recommendation Therapeutic Recreation Interventions: Pet therapy;Kitchen group Patient destination: Home Follow Up Recommendations: Cardiac Rehab Equipment Recommended: To be determined   OT Evaluation Precautions/Restrictions  Precautions Precautions: Fall;Sternal Precaution Comments: Verbal review of sternal precautions Restrictions Weight Bearing Restrictions: No Other Position/Activity Restrictions: Sternal Precautions General Chart Reviewed: No Family/Caregiver Present: No Vital Signs Therapy Vitals Patient Position (if appropriate): Orthostatic Vitals Oxygen Therapy SpO2: 97 % O2 Device: Room Air Pain Pain Assessment Pain Scale: 0-10 Pain Score: 0-No pain Pain Type: Acute pain;Surgical pain Pain Location: Sternum Home Living/Prior Functioning Home Living Family/patient expects to be discharged to:: Private  residence Living Arrangements: Alone Available Help at Discharge: Available PRN/intermittently Type of Home: Apartment Home Access: Stairs to enter [employer] of Steps: 7+ landing, then 7+; bilateral rails on first flight and landing but on 2nd flight there is only 1 rail on R side Entrance Stairs-Rails: Right, Left Home Layout: One level Bathroom Shower/Tub: Tub/shower unit, Curtain (Suction-cup grab bar & non-slick mat) Bathroom Toilet: Standard Bathroom Accessibility: No  Lives With: Alone IADL History Homemaking Responsibilities: Yes Meal Prep Responsibility: Primary Laundry Responsibility: No Cleaning Responsibility: Primary Bill Paying/Finance Responsibility: Primary Shopping Responsibility: No Current License: No Mode of Transportation: Bus (~ 1/2 mile from apartment to bus-stop) Occupation: Part time employment Type of Occupation: Part time at [employer] and Hobbies: Playing darts/pool, reading, cooking, going out to eat with brother. Prior Function Level of Independence: Independent with basic ADLs, Independent with transfers, Independent with gait, Independent with homemaking with ambulation Driving: No Vocation: Part time employment Vocation Requirements: Part-time at [employer] Leisure: Hobbies-yes (Comment) Vision Baseline Vision/History: 1 Wears glasses Ability to See in Adequate Light: 1 Impaired Patient Visual Report: No change from baseline Vision Assessment?: No apparent visual deficits Perception  Perception: Within Functional Limits Praxis Praxis: WFL Cognition Cognition Overall Cognitive Status: Within Functional Limits for tasks assessed Arousal/Alertness: Awake/alert Orientation Level: Person;Place;Situation Memory: Appears intact Safety/Judgment: Appears intact Brief Interview for Mental Status (BIMS) Repetition of Three Words (First Attempt): 3 Temporal Orientation: Year: Correct Temporal Orientation: Month: Accurate within 5  days Temporal Orientation: Day: Correct Recall: "Sock": Yes, no cue required Recall: "Blue": Yes, no cue required Recall: "Bed": Yes, no cue required BIMS Summary Score: 15 Sensation Sensation Light Touch: Appears Intact Hot/Cold: Appears Intact Proprioception: Appears Intact Stereognosis: Appears Intact Coordination Gross Motor Movements are Fluid and Coordinated: No Fine Motor Movements are Fluid and Coordinated: Yes Coordination and Movement Description: Decreased due to post-op debility. Motor  Motor Motor: Within Functional Limits  Trunk/Postural Assessment  Cervical Assessment Cervical Assessment: Within Functional Limits Thoracic Assessment Thoracic Assessment: Within Functional Limits Lumbar Assessment Lumbar Assessment: Within Functional Limits Postural Control Postural Control: Within Functional Limits  Balance Balance Balance Assessed: Yes Static Sitting Balance Static Sitting - Balance Support: Feet unsupported Static Sitting - Level of Assistance: 7: Independent Dynamic Sitting Balance Dynamic Sitting -  Balance Support: Feet unsupported;During functional activity Dynamic Sitting - Level of Assistance: 7: Independent Dynamic Sitting - Balance Activities: Reaching for objects;Forward lean/weight shifting Static Standing Balance Static Standing - Balance Support: No upper extremity supported;During functional activity Static Standing - Level of Assistance: 5: Stand by assistance (SUP) Dynamic Standing Balance Dynamic Standing - Balance Support: No upper extremity supported;During functional activity Dynamic Standing - Level of Assistance: 5: Stand by assistance (CGA) Dynamic Standing - Balance Activities: Lateral lean/weight shifting;Forward lean/weight shifting Extremity/Trunk Assessment RUE Assessment RUE Assessment: Exceptions to [facility] General Strength Comments: Limited by sternal precautions LUE Assessment LUE Assessment: Exceptions to [facility] General  Strength Comments: Limited by sternal precautions  Care Tool Care Tool Self Care Eating   Eating Assist Level: Independent    Oral Care    Oral Care Assist Level: Set up assist    Bathing   Body parts bathed by patient: Right arm;Left arm;Chest;Abdomen;Front perineal area;Buttocks;Right upper leg;Left upper leg;Face;Right lower leg;Left lower leg     Assist Level: Contact Guard/Touching assist    Upper Body Dressing(including orthotics)   What is the patient wearing?: Pull over shirt   Assist Level: Set up assist    Lower Body Dressing (excluding footwear)   What is the patient wearing?: Underwear/pull up Assist for lower body dressing: Contact Guard/Touching assist    Putting on/Taking off footwear   What is the patient wearing?: Socks Assist for footwear: Maximal Assistance - Patient 25 - 49%       Care Tool Toileting Toileting activity   Assist for toileting: Set up assist (Bladder void with use of urinal)     Care Tool Bed Mobility Roll left and right activity        Sit to lying activity        Lying to sitting on side of bed activity         Care Tool Transfers Sit to stand transfer   Sit to stand assist level: Minimal Assistance - Patient > 75%    Chair/bed transfer   Chair/bed transfer assist level: Minimal Assistance - Patient > 75%     Toilet transfer   Assist Level: Minimal Assistance - Patient > 75%     Care Tool Cognition  Expression of Ideas and Wants Expression of Ideas and Wants: 4. Without difficulty (complex and basic) - expresses complex messages without difficulty and with speech that is clear and easy to understand  Understanding Verbal and Non-Verbal Content Understanding Verbal and Non-Verbal Content: 4. Understands (complex and basic) - clear comprehension without cues or repetitions   Memory/Recall Ability Memory/Recall Ability : That he or she is in a hospital/hospital unit   Refer to Care Plan for Long Term Goals  SHORT TERM  GOAL WEEK 1 OT Short Term Goal 1 (Week 1): STGs=LTGs due to patient's estimated length of stay.  Recommendations for other services: None    Skilled Therapeutic Intervention Pt greeted resting in bed for skilled OT session with focus on comprehensive OT evaluation.   Pain: Pt pain reported above, OT offering intermediate rest breaks and positioning suggestions throughout session to address pain/fatigue and maximize participation/safety in session.   Session began with introduction to OT role, OT POC, and general orientation to rehab unit/schedule. Pt completes full-body sponge-bathing with levels of assistance noted below. Pt with reports of increased dizziness/nausea/emesis production prior to eval, orthostatic vitals taken and noted in flowsheets. Minimal dizziness present during this session, improving with upright position.   Pt remained sitting in recliner  with 4Ps assessed and immediate needs met. Pt continues to be appropriate for skilled OT intervention to promote further functional independence in ADLs/IADLs.   ADL ADL Eating: Independent Where Assessed-Eating: Bed level Grooming: Supervision/safety;Setup Where Assessed-Grooming: Chair Upper Body Bathing: Supervision/safety;Setup Where Assessed-Upper Body Bathing: Chair Lower Body Bathing: Contact guard Where Assessed-Lower Body Bathing: Chair Upper Body Dressing: Supervision/safety;Setup Where Assessed-Upper Body Dressing: Chair Lower Body Dressing: Contact guard Where Assessed-Lower Body Dressing: Chair Toileting: Contact guard;Supervision/safety;Setup Where Assessed-Toileting: Toilet;Other (Comment) (Urinal) Toilet Transfer: Minimal assistance Toilet Transfer Method: Proofreader: Bedside commode Tub/Shower Transfer: Unable to assess Psychologist, counselling Transfer: Unable to assess Mobility  Bed Mobility Bed Mobility: Rolling Right;Rolling Left;Supine to Sit;Sit to Supine Rolling Right:  Independent Rolling Left: Independent Supine to Sit: Minimal Assistance - Patient > 75% Sit to Supine: Minimal Assistance - Patient > 75% Transfers Sit to Stand: Minimal Assistance - Patient > 75% Stand to Sit: Contact Guard/Touching assist   Discharge Criteria: Patient will be discharged from OT if patient refuses treatment 3 consecutive times without medical reason, if treatment goals not met, if there is a change in medical status, if patient makes no progress towards goals or if patient is discharged from hospital.  The above assessment, treatment plan, treatment alternatives and goals were discussed and mutually agreed upon: by patient  Lou Cal, OTR/L, MSOT  01/05/2023, 9:35 AM

## 2023-01-05 NOTE — Plan of Care (Signed)
  Problem: Consults Goal: RH GENERAL PATIENT EDUCATION Description: See Patient Education module for education specifics. Outcome: Progressing   Problem: RH BOWEL ELIMINATION Goal: RH STG MANAGE BOWEL WITH ASSISTANCE Description: STG Manage Bowel with mod I Assistance. Outcome: Progressing Goal: RH STG MANAGE BOWEL W/MEDICATION W/ASSISTANCE Description: STG Manage Bowel with Medication with mod I Assistance. Outcome: Progressing   Problem: RH SKIN INTEGRITY Goal: RH STG SKIN FREE OF INFECTION/BREAKDOWN Description: Manage w min assist Outcome: Progressing Goal: RH STG MAINTAIN SKIN INTEGRITY WITH ASSISTANCE Description: STG Maintain Skin Integrity With min Assistance. Outcome: Progressing   Problem: RH SAFETY Goal: RH STG ADHERE TO SAFETY PRECAUTIONS W/ASSISTANCE/DEVICE Description: STG Adhere to Safety Precautions With cues Assistance/Device. Outcome: Progressing   Problem: RH PAIN MANAGEMENT Goal: RH STG PAIN MANAGED AT OR BELOW PT'S PAIN GOAL Description: < 4 with prns Outcome: Progressing   Problem: RH KNOWLEDGE DEFICIT GENERAL Goal: RH STG INCREASE KNOWLEDGE OF SELF CARE AFTER HOSPITALIZATION Description: Patient will be able to manage care at discharge with medications and dietary modifications using educational resources independently Outcome: Progressing

## 2023-01-05 NOTE — Plan of Care (Signed)
  Problem: RH Balance Goal: LTG Patient will maintain dynamic standing with ADLs (OT) Description: LTG:  Patient will maintain dynamic standing balance with assist during activities of daily living (OT)  Flowsheets (Taken 01/05/2023 1248) LTG: Pt will maintain dynamic standing balance during ADLs with: Independent with assistive device   Problem: Sit to Stand Goal: LTG:  Patient will perform sit to stand in prep for activites of daily living with assistance level (OT) Description: LTG:  Patient will perform sit to stand in prep for activites of daily living with assistance level (OT) Flowsheets (Taken 01/05/2023 1248) LTG: PT will perform sit to stand in prep for activites of daily living with assistance level: Independent with assistive device   Problem: RH Bathing Goal: LTG Patient will bathe all body parts with assist levels (OT) Description: LTG: Patient will bathe all body parts with assist levels (OT) Flowsheets (Taken 01/05/2023 1248) LTG: Pt will perform bathing with assistance level/cueing: Independent with assistive device    Problem: RH Dressing Goal: LTG Patient will perform upper body dressing (OT) Description: LTG Patient will perform upper body dressing with assist, with/without cues (OT). Flowsheets (Taken 01/05/2023 1248) LTG: Pt will perform upper body dressing with assistance level of: Independent with assistive device Goal: LTG Patient will perform lower body dressing w/assist (OT) Description: LTG: Patient will perform lower body dressing with assist, with/without cues in positioning using equipment (OT) Flowsheets (Taken 01/05/2023 1248) LTG: Pt will perform lower body dressing with assistance level of: Independent with assistive device   Problem: RH Toileting Goal: LTG Patient will perform toileting task (3/3 steps) with assistance level (OT) Description: LTG: Patient will perform toileting task (3/3 steps) with assistance level (OT)  Flowsheets (Taken 01/05/2023  1248) LTG: Pt will perform toileting task (3/3 steps) with assistance level: Independent with assistive device   Problem: RH Simple Meal Prep Goal: LTG Patient will perform simple meal prep w/assist (OT) Description: LTG: Patient will perform simple meal prep with assistance, with/without cues (OT). Flowsheets (Taken 01/05/2023 1248) LTG: Pt will perform simple meal prep with assistance level of: Independent with assistive device   Problem: RH Light Housekeeping Goal: LTG Patient will perform light housekeeping w/assist (OT) Description: LTG: Patient will perform light housekeeping with assistance, with/without cues (OT). Flowsheets (Taken 01/05/2023 1248) LTG: Pt will perform light housekeeping with assistance level of: Independent with assistive device   Problem: RH Toilet Transfers Goal: LTG Patient will perform toilet transfers w/assist (OT) Description: LTG: Patient will perform toilet transfers with assist, with/without cues using equipment (OT) Flowsheets (Taken 01/05/2023 1248) LTG: Pt will perform toilet transfers with assistance level of: Independent with assistive device   Problem: RH Tub/Shower Transfers Goal: LTG Patient will perform tub/shower transfers w/assist (OT) Description: LTG: Patient will perform tub/shower transfers with assist, with/without cues using equipment (OT) Flowsheets (Taken 01/05/2023 1248) LTG: Pt will perform tub/shower stall transfers with assistance level of: Independent with assistive device

## 2023-01-05 NOTE — Progress Notes (Signed)
PROGRESS NOTE   Subjective/Complaints: C/o dizziness, discussed that Na is low, liberalized diet to regular as he is currently on low salt diet Discussed potassium is low  ROS: +orthostasis   Objective:   No results found. Recent Labs    01/03/23 0243 01/05/23 0531  WBC 8.1 6.2  HGB 7.5* 7.3*  HCT 23.2* 21.6*  PLT 146* 209   Recent Labs    01/03/23 0243 01/05/23 0531  NA 129* 129*  K 3.8 3.0*  CL 93* 92*  CO2 26 29  GLUCOSE 119* 99  BUN 13 9  CREATININE 0.81 0.73  CALCIUM 8.5* 8.2*    Intake/Output Summary (Last 24 hours) at 01/05/2023 1528 Last data filed at 01/05/2023 1252 Gross per 24 hour  Intake 476 ml  Output 500 ml  Net -24 ml        Physical Exam: Vital Signs Blood pressure 118/66, pulse 80, temperature 98.5 F (36.9 C), temperature source Oral, resp. rate 18, height 6\' 3"  (1.905 m), weight 127.3 kg, SpO2 97%. Gen: no distress, normal appearing HEENT: oral mucosa pink and moist, NCAT Cardio: Well perfused appearance.  1+ right lower extremity edema, 2+ left lower extremity edema Abdomen: Mildly distended, nontender, normoactive bowel sounds. Psych: Appropriate mood and affect. Skin: + Sternal incision glued, well-approximated, healing + Abdominal incisions with sutures intact, well-approximated, no apparent drainage.  Covered in Mepilex. + Left lower extremity incisions with sutures intact, well-approximated, mild warmth and edema surrounding but no erythema or active drainage. + Right back laceration as below    Neuro: AAOx4. No apparent cognitive deficits  DTRs: Reflexes were 2+ in bilateral achilles, patella, biceps, BR and triceps. Sensory exam: revealed normal sensation in all dermatomal regions in bilateral upper extremities and bilateral lower extremities Motor exam: strength 5/5 throughout bilateral upper extremities, bilateral lower extremities, and with exception of 5- out of 5 in  bilateral hip flexors while maintaining upper body range of motion restrictions. Coordination: Fine motor coordination was normal.       Assessment/Plan: 1. Functional deficits which require 3+ hours per day of interdisciplinary therapy in a comprehensive inpatient rehab setting. Physiatrist is providing close team supervision and 24 hour management of active medical problems listed below. Physiatrist and rehab team continue to assess barriers to discharge/monitor patient progress toward functional and medical goals  Care Tool:  Bathing    Body parts bathed by patient: Right arm, Left arm, Chest, Abdomen, Front perineal area, Buttocks, Right upper leg, Left upper leg, Face, Right lower leg, Left lower leg         Bathing assist Assist Level: Contact Guard/Touching assist     Upper Body Dressing/Undressing Upper body dressing   What is the patient wearing?: Pull over shirt    Upper body assist Assist Level: Set up assist    Lower Body Dressing/Undressing Lower body dressing      What is the patient wearing?: Underwear/pull up     Lower body assist Assist for lower body dressing: Contact Guard/Touching assist     Toileting Toileting    Toileting assist Assist for toileting: Set up assist (Bladder void with use of urinal)     Transfers Chair/bed  transfer  Transfers assist     Chair/bed transfer assist level: Minimal Assistance - Patient > 75%     Locomotion Ambulation   Ambulation assist      Assist level: Minimal Assistance - Patient > 75% Assistive device: Walker-rolling Max distance: 200   Walk 10 feet activity   Assist     Assist level: Minimal Assistance - Patient > 75% Assistive device: Walker-rolling   Walk 50 feet activity   Assist    Assist level: Minimal Assistance - Patient > 75% Assistive device: Walker-rolling    Walk 150 feet activity   Assist    Assist level: Minimal Assistance - Patient > 75% Assistive device:  Walker-rolling    Walk 10 feet on uneven surface  activity   Assist     Assist level: Minimal Assistance - Patient > 75% Assistive device: Development worker, international aid     Assist Is the patient using a wheelchair?: Yes      Wheelchair assist level: Dependent - Patient 0% Max wheelchair distance: 0    Wheelchair 50 feet with 2 turns activity    Assist        Assist Level: Dependent - Patient 0%   Wheelchair 150 feet activity     Assist  Wheelchair 150 feet activity did not occur:  (limited 2/2 endurance deficit)   Assist Level: Dependent - Patient 0%   Blood pressure 118/66, pulse 80, temperature 98.5 F (36.9 C), temperature source Oral, resp. rate 18, height 6\' 3"  (1.905 m), weight 127.3 kg, SpO2 97%.    Medical Problem List and Plan: 1. Functional deficits secondary to cardiac debility status post CABG             -patient may not shower unless abdominal and LLE incisions covered             -ELOS/Goals: 7 to 10 days, modified independent PT/OT   2.  Antithrombotics: -DVT/anticoagulation:  Pharmaceutical: Eliquis             -antiplatelet therapy: Aspirin 81 mg daily             - LLE duplex on admission for increasing edema   3. Pain Management: Tylenol, tramadol as needed   4. Mood/Behavior/Sleep: LCSW to evaluate and provide emotional support             -trazodone 100 mg q HS prn             -antipsychotic agents: n/a   5. Neuropsych/cognition: This patient IS capable of making decisions on HIS own behalf.   6. Skin/Wound Care: Routine skin care checks   7. Fluids/Electrolytes/Nutrition: Strict Is and Os and follow-up chemistries   8: Hypertension: monitor TID and prn (see #10 meds below)    9: Hyperlipidemia: continue statin   10: Acute systolic CHF (cardiogenic shock>>resolved)             -daily weight             -continue digoxin 0.125 mg daily             -continue spironolactone 25 mg daily                         -continue  torsemide 40 mg daily             -continue amiodarone 200 mg BID   11: CAD:             -s/p  CABG 12/02             -follow-up with Dr. Laneta Simmers             -continue asa 325 mg daily   12: Atrial fibrillation:             -on Eliquis 5 mg BID             -continue digoxin 0.125 mg daily             -continue amiodarone 200 mg BID   13: Obesity: BMI = 36.17, provided dietary education   14: ABLA: continue Trigels-F Forte daily  -follow-up CBC   15: Hyponatremia: trending up slowly -follow-up BMP   16: Thrombocytopenia: improving, no bleeding; follow-up CBC   17: Hypokalemia: Klor-Con 20 mEq BID   18: Nausea: Reglan 10 mg TID   19: Constipation: continue Colace 200 mg daily, Dulcolax tab daily; prn meds ordered   20: GERD?: Protonix 80 mg  (home on Nexium 40 mg BID)   21: Cough?: on Mucinex 600 mg BID, incentive spirometer ordered   22. Constipation/nausea. LBM 2 days ago. Add sennakot s 1 tab at bedtime. Has PRN sorbitol. Las BM 12/5, added miralax BID  23. Orthostasis: discussed hyponatremia could contribute, liberalized diet to regular  LOS: 1 days A FACE TO FACE EVALUATION WAS PERFORMED  Clint Bolder P Danielly Ackerley 01/05/2023, 3:28 PM

## 2023-01-06 ENCOUNTER — Inpatient Hospital Stay (HOSPITAL_COMMUNITY): Payer: Managed Care, Other (non HMO)

## 2023-01-06 DIAGNOSIS — M7989 Other specified soft tissue disorders: Secondary | ICD-10-CM | POA: Diagnosis not present

## 2023-01-06 LAB — BASIC METABOLIC PANEL
Anion gap: 9 (ref 5–15)
BUN: 8 mg/dL (ref 8–23)
CO2: 29 mmol/L (ref 22–32)
Calcium: 8.5 mg/dL — ABNORMAL LOW (ref 8.9–10.3)
Chloride: 94 mmol/L — ABNORMAL LOW (ref 98–111)
Creatinine, Ser: 0.87 mg/dL (ref 0.61–1.24)
GFR, Estimated: >60 mL/min (ref >60)
Glucose, Bld: 116 mg/dL — ABNORMAL HIGH (ref 70–99)
Potassium: 3.1 mmol/L — ABNORMAL LOW (ref 3.5–5.1)
Sodium: 132 mmol/L — ABNORMAL LOW (ref 135–145)

## 2023-01-06 LAB — MAGNESIUM: Magnesium: 1.6 mg/dL — ABNORMAL LOW (ref 1.7–2.4)

## 2023-01-06 MED ORDER — METHOCARBAMOL 500 MG PO TABS
500.0000 mg | ORAL_TABLET | Freq: Four times a day (QID) | ORAL | Status: DC | PRN
  Administered 2023-01-06 – 2023-01-12 (×3): 500 mg via ORAL
  Filled 2023-01-06 (×3): qty 1

## 2023-01-06 MED ORDER — MAGNESIUM SULFATE 4 GM/100ML IV SOLN
4.0000 g | Freq: Once | INTRAVENOUS | Status: AC
  Administered 2023-01-06: 4 g via INTRAVENOUS
  Filled 2023-01-06: qty 100

## 2023-01-06 MED ORDER — POTASSIUM CHLORIDE 20 MEQ PO PACK
40.0000 meq | PACK | Freq: Two times a day (BID) | ORAL | Status: AC
  Administered 2023-01-06 – 2023-01-07 (×2): 40 meq via ORAL
  Filled 2023-01-06 (×2): qty 2

## 2023-01-06 MED ORDER — TORSEMIDE 20 MG PO TABS
30.0000 mg | ORAL_TABLET | Freq: Every day | ORAL | Status: DC
  Administered 2023-01-07 – 2023-01-14 (×8): 30 mg via ORAL
  Filled 2023-01-06 (×8): qty 2

## 2023-01-06 NOTE — Progress Notes (Signed)
VASCULAR LAB    Left lower extremity venous duplex has been performed.  See CV proc for preliminary results.   Shallon Yaklin, RVT 01/06/2023, 5:10 PM

## 2023-01-06 NOTE — Progress Notes (Signed)
PROGRESS NOTE   Subjective/Complaints: No new complaints this morning Feels swelling is much better, weight is stable, discussed will decrease torsemide to 30 given dizziness  ROS: +orthostasis, +dizziness   Objective:   No results found. Recent Labs    01/05/23 0531  WBC 6.2  HGB 7.3*  HCT 21.6*  PLT 209   Recent Labs    01/05/23 0531 01/06/23 0618  NA 129* 132*  K 3.0* 3.1*  CL 92* 94*  CO2 29 29  GLUCOSE 99 116*  BUN 9 8  CREATININE 0.73 0.87  CALCIUM 8.2* 8.5*    Intake/Output Summary (Last 24 hours) at 01/06/2023 1743 Last data filed at 01/06/2023 1200 Gross per 24 hour  Intake 637 ml  Output 1025 ml  Net -388 ml        Physical Exam: Vital Signs Blood pressure 122/72, pulse 85, temperature 98.3 F (36.8 C), temperature source Oral, resp. rate 19, height 6\' 3"  (1.905 m), weight 127.3 kg, SpO2 97%. Gen: no distress, normal appearing HEENT: oral mucosa pink and moist, NCAT Cardio: Well perfused appearance.  1+ right lower extremity edema, 2+ left lower extremity edema Abdomen: Mildly distended, nontender, normoactive bowel sounds. Psych: Appropriate mood and affect. Skin: + Sternal incision glued, well-approximated, healing + Abdominal incisions with sutures intact, well-approximated, no apparent drainage.  Covered in Mepilex. + Left lower extremity incisions with sutures intact, well-approximated, mild warmth and edema surrounding but no erythema or active drainage. + Right back laceration as below    Neuro: AAOx4. No apparent cognitive deficits  DTRs: Reflexes were 2+ in bilateral achilles, patella, biceps, BR and triceps. Sensory exam: revealed normal sensation in all dermatomal regions in bilateral upper extremities and bilateral lower extremities Motor exam: strength 5/5 throughout bilateral upper extremities, bilateral lower extremities, and with exception of 5- out of 5 in bilateral hip  flexors while maintaining upper body range of motion restrictions. Coordination: Fine motor coordination was normal.  Stable 12/8     Assessment/Plan: 1. Functional deficits which require 3+ hours per day of interdisciplinary therapy in a comprehensive inpatient rehab setting. Physiatrist is providing close team supervision and 24 hour management of active medical problems listed below. Physiatrist and rehab team continue to assess barriers to discharge/monitor patient progress toward functional and medical goals  Care Tool:  Bathing    Body parts bathed by patient: Right arm, Left arm, Chest, Abdomen, Front perineal area, Buttocks, Right upper leg, Left upper leg, Face, Right lower leg, Left lower leg         Bathing assist Assist Level: Contact Guard/Touching assist     Upper Body Dressing/Undressing Upper body dressing   What is the patient wearing?: Pull over shirt    Upper body assist Assist Level: Set up assist    Lower Body Dressing/Undressing Lower body dressing      What is the patient wearing?: Pants     Lower body assist Assist for lower body dressing: Contact Guard/Touching assist     Toileting Toileting    Toileting assist Assist for toileting: Contact Guard/Touching assist     Transfers Chair/bed transfer  Transfers assist     Chair/bed transfer assist level: Contact  Guard/Touching assist     Locomotion Ambulation   Ambulation assist      Assist level: Minimal Assistance - Patient > 75% Assistive device: Walker-rolling Max distance: 200   Walk 10 feet activity   Assist     Assist level: Minimal Assistance - Patient > 75% Assistive device: Walker-rolling   Walk 50 feet activity   Assist    Assist level: Minimal Assistance - Patient > 75% Assistive device: Walker-rolling    Walk 150 feet activity   Assist    Assist level: Minimal Assistance - Patient > 75% Assistive device: Walker-rolling    Walk 10 feet on uneven  surface  activity   Assist     Assist level: Minimal Assistance - Patient > 75% Assistive device: Development worker, international aid     Assist Is the patient using a wheelchair?: Yes      Wheelchair assist level: Dependent - Patient 0% Max wheelchair distance: 0    Wheelchair 50 feet with 2 turns activity    Assist        Assist Level: Dependent - Patient 0%   Wheelchair 150 feet activity     Assist  Wheelchair 150 feet activity did not occur:  (limited 2/2 endurance deficit)   Assist Level: Dependent - Patient 0%   Blood pressure 122/72, pulse 85, temperature 98.3 F (36.8 C), temperature source Oral, resp. rate 19, height 6\' 3"  (1.905 m), weight 127.3 kg, SpO2 97%.    Medical Problem List and Plan: 1. Functional deficits secondary to cardiac debility status post CABG             -patient may not shower unless abdominal and LLE incisions covered             -ELOS/Goals: 7 to 10 days, modified independent PT/OT   2.  Antithrombotics: -DVT/anticoagulation:  Pharmaceutical: Eliquis             -antiplatelet therapy: Aspirin 81 mg daily             - LLE duplex on admission for increasing edema   3. Pain Management: Tylenol, tramadol as needed   4. Mood/Behavior/Sleep: LCSW to evaluate and provide emotional support             -trazodone 100 mg q HS prn             -antipsychotic agents: n/a   5. Neuropsych/cognition: This patient IS capable of making decisions on HIS own behalf.   6. Skin/Wound Care: Routine skin care checks   7. Fluids/Electrolytes/Nutrition: Strict Is and Os and follow-up chemistries   8: Hypertension: monitor TID and prn (see #10 meds below)    9: Hyperlipidemia: continue statin   10: Acute systolic CHF (cardiogenic shock>>resolved)             -daily weight             -continue digoxin 0.125 mg daily             -continue spironolactone 25 mg daily                         -continue torsemide 40 mg daily              -continue amiodarone 200 mg BID   11: CAD:             -s/p CABG 12/02             -follow-up with  Dr. Laneta Simmers             -continue asa 325 mg daily   12: Atrial fibrillation:             -on Eliquis 5 mg BID             -continue digoxin 0.125 mg daily             -continue amiodarone 200 mg BID   13: Obesity: BMI = 36.17, provided dietary education   14: ABLA: continue Trigels-F Forte daily  -follow-up CBC   15: Hyponatremia: trending up slowly -follow-up BMP   16: Thrombocytopenia: improving, no bleeding; follow-up CBC   17: Hypokalemia: Klor-Con 20 mEq BID   18: Nausea: Reglan 10 mg TID   19: Constipation: continue Colace 200 mg daily, Dulcolax tab daily; prn meds ordered   20: GERD?: Protonix 80 mg  (home on Nexium 40 mg BID)   21: Cough?: on Mucinex 600 mg BID, incentive spirometer ordered   22. Constipation/nausea. LBM 2 days ago. Add sennakot s 1 tab at bedtime. Has PRN sorbitol. Las BM 12/5, added miralax BID  23. Orthostasis: discussed hyponatremia could contribute, liberalized diet to regular, decrease torsemide to 30mg   24. Edema: improving, decreased torsemide to 30mg   25. Cramps: supplement mag and potassium  26. Magnesium deficiency: supplement IV mag 4 grams  LOS: 2 days A FACE TO FACE EVALUATION WAS PERFORMED  Drema Pry Autumne Kallio 01/06/2023, 5:43 PM

## 2023-01-06 NOTE — Progress Notes (Signed)
Occupational Therapy Session Note  Patient Details  Name: Jeremiah Dixon MRN: [id number] Date of Birth: May 30, 1960  Today's Date: 01/06/2023 OT Individual Time: 1191-4782 OT Individual Time Calculation (min): 61 min    Short Term Goals: Week 1:  OT Short Term Goal 1 (Week 1): STGs=LTGs due to patient's estimated length of stay.  Skilled Therapeutic Interventions/Progress Updates:  Pt greeted supine in bed, pt agreeable to OT intervention.      Transfers/bed mobility: pt completed supine>sit with supervision. Pt does endorse dizziness from EOB but subsides with + time and pursed lip breathing. Pt completed sit>stand from EOB with no AD and cGA, pt able to step up on scale for weight with CGA. Pt completed stand pivot to w/c with RW and CGA. Great adherence to sternal precautions; pt completed ambulatory transfer into bathroom with RW and CGA. Pt completed functional ambulation ~ 30 ft with Rw and CGA. Pt completed all sit>stands with CGA, pt does seem to stand up quickly and does report dizziness upon initial stand however dizziness always improved with time.   Therapeutic activity:  Pt completed dynamic balance + endurance task with pt instructed to complete alternating stepping up/down onto 3 inch step to facilitate improved activity tolerance and simulate shower transfer body mechanics. Pt completed task with CGA however pt greatly fatigued from task. Monitored O2 /HR during task with VSS.   ADLs:  Grooming: pt completed seated grooming at sink MODI UB dressing:pt donned OH shirt at sink with set- up assist.  LB dressing: pt donned pants via sit>stand with no Ad and CGA Footwear: pt donned shoes/socks via figure 4 with set- up assist.   Bathing: pt completed bathing at sink with CGA, pt able to stand as needed with CGA for LB bathing Toileting: pt with continent urine void completing 3/3 toileting tasks with CGA.    Ended session with pt seated in recliner with all needs within  reach and chair alarm activated.                    Therapy Documentation Precautions:  Precautions Precautions: Fall, Sternal Precaution Comments: Verbal review of sternal precautions Restrictions Weight Bearing Restrictions: No RUE Weight Bearing: Non weight bearing LUE Weight Bearing: Non weight bearing Other Position/Activity Restrictions: Sternal Precautions    Pain: No pain    Therapy/Group: Individual Therapy  Barron Schmid 01/06/2023, 12:18 PM

## 2023-01-06 NOTE — Plan of Care (Signed)
  Problem: Consults Goal: RH GENERAL PATIENT EDUCATION Description: See Patient Education module for education specifics. Outcome: Progressing   Problem: RH BOWEL ELIMINATION Goal: RH STG MANAGE BOWEL WITH ASSISTANCE Description: STG Manage Bowel with mod I Assistance. Outcome: Progressing Goal: RH STG MANAGE BOWEL W/MEDICATION W/ASSISTANCE Description: STG Manage Bowel with Medication with mod I Assistance. Outcome: Progressing   Problem: RH SKIN INTEGRITY Goal: RH STG SKIN FREE OF INFECTION/BREAKDOWN Description: Manage w min assist Outcome: Progressing Goal: RH STG MAINTAIN SKIN INTEGRITY WITH ASSISTANCE Description: STG Maintain Skin Integrity With min Assistance. Outcome: Progressing   Problem: RH SAFETY Goal: RH STG ADHERE TO SAFETY PRECAUTIONS W/ASSISTANCE/DEVICE Description: STG Adhere to Safety Precautions With cues Assistance/Device. Outcome: Progressing   Problem: RH PAIN MANAGEMENT Goal: RH STG PAIN MANAGED AT OR BELOW PT'S PAIN GOAL Description: < 4 with prns Outcome: Progressing   Problem: RH KNOWLEDGE DEFICIT GENERAL Goal: RH STG INCREASE KNOWLEDGE OF SELF CARE AFTER HOSPITALIZATION Description: Patient will be able to manage care at discharge with medications and dietary modifications using educational resources independently Outcome: Progressing

## 2023-01-06 NOTE — Progress Notes (Signed)
Occupational Therapy Session Note  Patient Details  Name: Jeremiah Dixon MRN: [id number] Date of Birth: April 12, 1960  Today's Date: 01/06/2023 OT Individual Time:1300-1345 &  8119-1478 OT Individual Time Calculation (min): 45 min & 47 min    Short Term Goals: Week 1:  OT Short Term Goal 1 (Week 1): STGs=LTGs due to patient's estimated length of stay.  Skilled Therapeutic Interventions/Progress Updates:      Therapy Documentation Precautions:  Precautions Precautions: Fall, Sternal Precaution Comments: Verbal review of sternal precautions Restrictions Weight Bearing Restrictions: No RUE Weight Bearing: Non weight bearing LUE Weight Bearing: Non weight bearing Other Position/Activity Restrictions: Sternal Precautions Session 1 General: "That made my day!" Pt seated in recliner upon OT arrival, agreeable to OT.  Vital Signs: Pt reporting slight OH symptoms when standing up too quick, resolves after less than 1 min with standing break. Pt educated on completing ACE wrapping in next session d/t pt hesitation with compression socks d/t incisions on leg.   Pain: no pain reported  ADL: Pt completed shaving face at sink standing with no AD and CGA for stability. Pt stood for >5 min with no OH symptoms.   Balance: mat ><W/C CGA with no AD and increased time with small steps  Exercises: Pt completing sit to stands with no AD with focus on slow mindful movements, correct form and breathing with no reported OH symptoms with learned techniques even from various heights. CGA with increased height, Min A from decreased height, was Mod A at beginning of session and decreased assistance with good carryover of techniques. Pt with good carryover of sternal precautions throughout session. Pt educated on OH symptoms and what to do when occur.    Pt seated in recliner at end of session with W/C alarm donned, call light within reach and 4Ps assessed.    Session 2 General: "I am excited!" Pt  seated in recliner upon OT arrival, agreeable to OT.  Pain: no pain reported  ADL: UB dressing: SBA doffing/donning shirt and hoodie in seated position LB dressing: CGA, able to thread pants in figure 4 method, educated on dressing weaker leg first with good carryover Footwear: SBA using figure 4 method to doff/don seated in chair Shower transfer: CGA ambulating from recliner><shower Bathing: OT covered all incisions and IV before bathing. Pt completed seated on TTB with CGA for standing for peri area. Pt would benefit from long handled sponge for lower legs. Pt educated on washing back with sternal precautions. Transfers: CGA without AD ambulating throughout room. Min A from recliner>stand d/t decreased height. One occurrence of nausea during session with recovery from deep breathing in less than 1 min.   Other Treatments: Pt educated on energy conservation techniques during bathing and other ADLs. Pt with good carryover of sternal precautions during ADL.   Pt seated in recliner at end of session with W/C alarm donned, call light within reach and 4Ps assessed.   Therapy/Group: Individual Therapy  Velia Meyer, OTD, OTR/L 01/06/2023, 4:42 PM

## 2023-01-06 NOTE — Progress Notes (Signed)
Dr. Dalene Carrow informed that patient is having muscle cramps potassium is 3.0 and Sodium 129 and in need of muscle relaxer

## 2023-01-06 NOTE — Progress Notes (Signed)
Physical Therapy Session Note  Patient Details  Name: Jeremiah Dixon MRN: [id number] Date of Birth: [birth year]  Today's Date: 01/06/2023 PT Individual Time: 1100-1157 PT Individual Time Calculation (min): 57 min   Short Term Goals: Week 1:  PT Short Term Goal 1 (Week 1): STG=LTG due to LOS  Skilled Therapeutic Interventions/Progress Updates:      Pt sitting upright in recliner to start - agreeable to PT tx. Has no pain but has reported feeling noxious and c/o dizziness/lightheadedness when standing.   Pt assisted to standing from recliner with minA for powering to rise - folds his arm across his chest to comply with sternal precautions. Short distance ambulatory transfer with CGA and RW to w/c.  Transported to main rehab gym.   BP checked in sitting vs standing: Sitting: 119/72 Standing: 126/80 *pt symptomatic despite rise in BP while standing.  *Frequent seated rest breaks needed during session to accommodate for the dizziness.   Pt reporting urgent need to void. Returned to his room at w/c level and completed stand pivot transfer with CGA to the toilet - pt continent of bladder void in standing.  Returned to rehab gym space.  Short distance ~31ft ambulation with RW and CGA with slow and cautious gait speed. Cues only for upright posture and forward gaze.   Standing there-ex in // bars: -2x20 heel raises -2x20 alternating marching in place -1x10 hip abd/add bilaterally *seated rest break b/w sets.   Returned to his room and assisted back to the recliner with ambulatory transfer. Left sitting with all needs met, call bell in reach.  Therapy Documentation Precautions:  Precautions Precautions: Fall, Sternal Precaution Comments: Verbal review of sternal precautions Restrictions Weight Bearing Restrictions: No RUE Weight Bearing: Non weight bearing LUE Weight Bearing: Non weight bearing Other Position/Activity Restrictions: Sternal Precautions    Therapy/Group:  Individual Therapy  Orrin Brigham 01/06/2023, 7:55 AM

## 2023-01-07 LAB — CBC
HCT: 23 % — ABNORMAL LOW (ref 39.0–52.0)
Hemoglobin: 7.5 g/dL — ABNORMAL LOW (ref 13.0–17.0)
MCH: 30.9 pg (ref 26.0–34.0)
MCHC: 32.6 g/dL (ref 30.0–36.0)
MCV: 94.7 fL (ref 80.0–100.0)
Platelets: 272 10*3/uL (ref 150–400)
RBC: 2.43 MIL/uL — ABNORMAL LOW (ref 4.22–5.81)
RDW: 13.7 % (ref 11.5–15.5)
WBC: 5 10*3/uL (ref 4.0–10.5)
nRBC: 0 % (ref 0.0–0.2)

## 2023-01-07 LAB — BASIC METABOLIC PANEL
Anion gap: 9 (ref 5–15)
BUN: 8 mg/dL (ref 8–23)
CO2: 28 mmol/L (ref 22–32)
Calcium: 8.2 mg/dL — ABNORMAL LOW (ref 8.9–10.3)
Chloride: 93 mmol/L — ABNORMAL LOW (ref 98–111)
Creatinine, Ser: 0.73 mg/dL (ref 0.61–1.24)
GFR, Estimated: >60 mL/min (ref >60)
Glucose, Bld: 93 mg/dL (ref 70–99)
Potassium: 3.2 mmol/L — ABNORMAL LOW (ref 3.5–5.1)
Sodium: 130 mmol/L — ABNORMAL LOW (ref 135–145)

## 2023-01-07 MED ORDER — SORBITOL 70 % SOLN
30.0000 mL | Freq: Once | Status: DC
  Filled 2023-01-07: qty 30

## 2023-01-07 MED ORDER — POTASSIUM CHLORIDE CRYS ER 20 MEQ PO TBCR
20.0000 meq | EXTENDED_RELEASE_TABLET | Freq: Every day | ORAL | Status: DC
  Administered 2023-01-08 – 2023-01-14 (×7): 20 meq via ORAL
  Filled 2023-01-07 (×8): qty 1

## 2023-01-07 MED ORDER — SENNA 8.6 MG PO TABS
1.0000 | ORAL_TABLET | Freq: Every day | ORAL | Status: DC
Start: 2023-01-07 — End: 2023-01-14
  Administered 2023-01-07 – 2023-01-14 (×8): 8.6 mg via ORAL
  Filled 2023-01-07 (×8): qty 1

## 2023-01-07 MED ORDER — TRAZODONE HCL 50 MG PO TABS
150.0000 mg | ORAL_TABLET | Freq: Every evening | ORAL | Status: DC | PRN
  Administered 2023-01-07 – 2023-01-13 (×7): 150 mg via ORAL
  Filled 2023-01-07 (×7): qty 3

## 2023-01-07 MED ORDER — ZOLPIDEM TARTRATE 5 MG PO TABS
5.0000 mg | ORAL_TABLET | Freq: Every evening | ORAL | Status: DC | PRN
  Administered 2023-01-07: 5 mg via ORAL
  Filled 2023-01-07: qty 1

## 2023-01-07 MED ORDER — POTASSIUM CHLORIDE CRYS ER 20 MEQ PO TBCR
40.0000 meq | EXTENDED_RELEASE_TABLET | Freq: Two times a day (BID) | ORAL | Status: AC
  Administered 2023-01-07: 40 meq via ORAL
  Filled 2023-01-07: qty 2

## 2023-01-07 NOTE — Progress Notes (Signed)
Occupational Therapy Session Note  Patient Details  Name: Jeremiah Dixon MRN: [id number] Date of Birth: [birth year]  Today's Date: 01/07/2023 OT Individual Time: 7829-5621 & 1330-1445 OT Individual Time Calculation (min): 60 min & 75 min   Short Term Goals: Week 1:  OT Short Term Goal 1 (Week 1): STGs=LTGs due to patient's estimated length of stay.  Skilled Therapeutic Interventions/Progress Updates:      Therapy Documentation Precautions:  Precautions Precautions: Fall, Sternal Precaution Comments: Verbal review of sternal precautions Restrictions Weight Bearing Restrictions: No RUE Weight Bearing: Non weight bearing LUE Weight Bearing: Non weight bearing Other Position/Activity Restrictions: Sternal Precautions Session 1 General: "Hello there!" Pt seated in recliner upon OT arrival, agreeable to OT.  Pain: no pain reported  ADL: Pt asking to shower again this date. OT suggested increasing challenge of shower in order for increased therapeutic outcome and pt was agreeable.  Grooming/oral hygiene: Pt standing at sink for shaving and brushing teeth SBA with no AD, intermittent UE support on sink  no LOB/SOB Toileting: pt standing with urinal to urinate with grab bar with SBA  UB dressing: SBA doffing/donning overhead shirt in seated position LB dressing: standing to doff underwear, able to take step out of underwear with no AD, seated in figure 4 to don pants, standing SBA to manage over waist Footwear: seated using figure 4 method to don/doff socks and shoes, able to tie shoes in figure 4 Shower transfer: CGA with no AD ambulating from recliner in room Bathing: SBA standing in shower with no AD with intermittent support of grab bars, no LOB/SOB, pt reported "feeling good" while completing task, all incisions covered Transfers: CGA with no AD throughout room, CGA sit to stands from lower surfaces, SBA from higher surfaces  Pt standing to shower then immediately after  standing to groom, standing in total for ~15 minutes without AD.   Pt seated in recliner at end of session with W/C alarm donned, call light within reach and 4Ps assessed.    Session 2 General: "Wow I am enjoying this." Pt seated in recliner upon OT arrival, agreeable to OT.  Pain:no pain reported  ADL: Pt and OT attempted to ambulate to therapy gym. Pt reported legs feeling weak from prior therapy session. Pt dependently transferred in W/C to gym. Pt did sit to stand SBA and transfers SBA. Pt completed toileting at SBA in standing to urinate with no LOB/SOB  Exercises: Pt completed the following exercise circuit in order to improve functional activity, strength and endurance to prepare for ADLs such as bathing. Pt completed the following exercises in seated position with no noted LOB/SOB and 3x10 repetitions with red theraband on each exercise: -bicep curls -triceps extensions -shoulder abduction -forward punches -modified sit ups -shoulder raises below 90 to adhere to sternal precautions 1x5 reps  -Pt completed exercises with emphasis on mind/muscle connection with slow and controlled movements. PRN rest breaks during exercises.   Pt seated in W/C at end of session with W/C alarm donned, call light within reach and 4Ps assessed.    Therapy/Group: Individual Therapy  Velia Meyer, OTD, OTR/L 01/07/2023, 4:15 PM

## 2023-01-07 NOTE — Progress Notes (Signed)
PROGRESS NOTE   Subjective/Complaints:  Pt reports less dizziness and less nausea.   LBM 36 hours ago- feels constipated.  Slept only 3 hours last night.    ROS: less orthostasis and dizziness  Pt denies SOB, abd pain, CP, N/V/C/D, and vision changes   Objective:   VAS [place] LOWER EXTREMITY VENOUS (DVT)  Result Date: 01/06/2023  Lower Venous DVT Study Patient Name:  Jeremiah Dixon  Date of Exam:   01/06/2023 Medical Rec #: [id number]             Accession #:    [id number] Date of Birth: Dec 30, 1960             Patient Gender: M Patient Age:   62 years Exam Location:  [facility] Procedure:      VAS [place] LOWER EXTREMITY VENOUS (DVT) Referring Phys: Elijah Birk --------------------------------------------------------------------------------  Indications: Edema. Other Indications: Recent open saphenous vein harvest for CABG. Limitations: Edema and bandages. Comparison Study: Prior negative bilateral LEV done 12/06/22 Performing Technologist: Sherren Kerns RVS  Examination Guidelines: A complete evaluation includes B-mode imaging, spectral Doppler, color Doppler, and power Doppler as needed of all accessible portions of each vessel. Bilateral testing is considered an integral part of a complete examination. Limited examinations for reoccurring indications may be performed as noted. The reflux portion of the exam is performed with the patient in reverse Trendelenburg.  +-----+---------------+---------+-----------+----------+--------------+ RIGHTCompressibilityPhasicitySpontaneityPropertiesThrombus Aging +-----+---------------+---------+-----------+----------+--------------+ CFV  Full           Yes      Yes                                 +-----+---------------+---------+-----------+----------+--------------+   +---------+---------------+---------+-----------+----------+--------------+ LEFT      CompressibilityPhasicitySpontaneityPropertiesThrombus Aging +---------+---------------+---------+-----------+----------+--------------+ CFV      Full           Yes      No                                  +---------+---------------+---------+-----------+----------+--------------+ SFJ      Full           Yes      No                                  +---------+---------------+---------+-----------+----------+--------------+ FV Prox  Full           Yes      Yes                                 +---------+---------------+---------+-----------+----------+--------------+ FV Mid   Full                                                        +---------+---------------+---------+-----------+----------+--------------+ FV DistalFull  Yes      No                                  +---------+---------------+---------+-----------+----------+--------------+ PFV      Full                                                        +---------+---------------+---------+-----------+----------+--------------+ POP      Full           Yes                                          +---------+---------------+---------+-----------+----------+--------------+ PTV      Full                                                        +---------+---------------+---------+-----------+----------+--------------+ PERO     Full                                                        +---------+---------------+---------+-----------+----------+--------------+ GSV      Full                                                        +---------+---------------+---------+-----------+----------+--------------+ Hematoma noted at GSV harvest sites   Summary: RIGHT: - No evidence of common femoral vein obstruction.   LEFT: - There is no evidence of deep vein thrombosis in the lower extremity.  - No cystic structure found in the popliteal fossa. - Hematoma noted at harvest sites  *See table(s)  above for measurements and observations.    Preliminary    Recent Labs    01/05/23 0531 01/07/23 0551  WBC 6.2 5.0  HGB 7.3* 7.5*  HCT 21.6* 23.0*  PLT 209 272   Recent Labs    01/06/23 0618 01/07/23 0551  NA 132* 130*  K 3.1* 3.2*  CL 94* 93*  CO2 29 28  GLUCOSE 116* 93  BUN 8 8  CREATININE 0.87 0.73  CALCIUM 8.5* 8.2*    Intake/Output Summary (Last 24 hours) at 01/07/2023 0947 Last data filed at 01/07/2023 0837 Gross per 24 hour  Intake 356 ml  Output 1175 ml  Net -819 ml        Physical Exam: Vital Signs Blood pressure 112/68, pulse 79, temperature 98 F (36.7 C), resp. rate 17, height 6\' 3"  (1.905 m), weight 127.3 kg, SpO2 99%.    General: awake, alert, appropriate, sitting up in bedside chair; NAD HENT: conjugate gaze; oropharynx moist CV: regular rate, rate controlled; no JVD Pulmonary: CTA B/L; no W/R/R- decreased at bases GI: soft, NT, ND, (+)BS- normoactive Psychiatric: appropriate- sitting up in bed Neurological: Ox3  Fair to poor STM Skin- Abdominal incisions with thick black sutures in place- chest incision healing well + Left lower extremity incisions with sutures intact, well-approximated, mild warmth and edema surrounding but no erythema or active drainage. + Right back laceration as below    Neuro: AAOx4. No apparent cognitive deficits  DTRs: Reflexes were 2+ in bilateral achilles, patella, biceps, BR and triceps. Sensory exam: revealed normal sensation in all dermatomal regions in bilateral upper extremities and bilateral lower extremities Motor exam: strength 5/5 throughout bilateral upper extremities, bilateral lower extremities, and with exception of 5- out of 5 in bilateral hip flexors while maintaining upper body range of motion restrictions. Coordination: Fine motor coordination was normal.  Stable 12/8     Assessment/Plan: 1. Functional deficits which require 3+ hours per day of interdisciplinary therapy in a comprehensive inpatient  rehab setting. Physiatrist is providing close team supervision and 24 hour management of active medical problems listed below. Physiatrist and rehab team continue to assess barriers to discharge/monitor patient progress toward functional and medical goals  Care Tool:  Bathing    Body parts bathed by patient: Right arm, Left arm, Chest, Abdomen, Front perineal area, Buttocks, Right upper leg, Left upper leg, Face, Right lower leg, Left lower leg         Bathing assist Assist Level: Contact Guard/Touching assist     Upper Body Dressing/Undressing Upper body dressing   What is the patient wearing?: Pull over shirt    Upper body assist Assist Level: Set up assist    Lower Body Dressing/Undressing Lower body dressing      What is the patient wearing?: Pants     Lower body assist Assist for lower body dressing: Contact Guard/Touching assist     Toileting Toileting    Toileting assist Assist for toileting: Contact Guard/Touching assist     Transfers Chair/bed transfer  Transfers assist     Chair/bed transfer assist level: Contact Guard/Touching assist     Locomotion Ambulation   Ambulation assist      Assist level: Minimal Assistance - Patient > 75% Assistive device: Walker-rolling Max distance: 200   Walk 10 feet activity   Assist     Assist level: Minimal Assistance - Patient > 75% Assistive device: Walker-rolling   Walk 50 feet activity   Assist    Assist level: Minimal Assistance - Patient > 75% Assistive device: Walker-rolling    Walk 150 feet activity   Assist    Assist level: Minimal Assistance - Patient > 75% Assistive device: Walker-rolling    Walk 10 feet on uneven surface  activity   Assist     Assist level: Minimal Assistance - Patient > 75% Assistive device: Development worker, international aid     Assist Is the patient using a wheelchair?: Yes      Wheelchair assist level: Dependent - Patient 0% Max wheelchair  distance: 0    Wheelchair 50 feet with 2 turns activity    Assist        Assist Level: Dependent - Patient 0%   Wheelchair 150 feet activity     Assist  Wheelchair 150 feet activity did not occur:  (limited 2/2 endurance deficit)   Assist Level: Dependent - Patient 0%   Blood pressure 112/68, pulse 79, temperature 98 F (36.7 C), resp. rate 17, height 6\' 3"  (1.905 m), weight 127.3 kg, SpO2 99%.    Medical Problem List and Plan: 1. Functional deficits secondary to cardiac debility status post CABG             -  patient may not shower unless abdominal and LLE incisions covered             -ELOS/Goals: 7 to 10 days, modified independent PT/OT   Con't CIR PT and OT  IPOC done today 2.  Antithrombotics: -DVT/anticoagulation:  Pharmaceutical: Eliquis             -antiplatelet therapy: Aspirin 81 mg daily             - LLE duplex on admission for increasing edema   3. Pain Management: Tylenol, tramadol as needed   12/9- denied significant pain- just soreness  4. Mood/Behavior/Sleep: LCSW to evaluate and provide emotional support             -trazodone 100 mg q HS prn             -antipsychotic agents: n/a   5. Neuropsych/cognition: This patient IS capable of making decisions on HIS own behalf.   6. Skin/Wound Care: Routine skin care checks   7. Fluids/Electrolytes/Nutrition: Strict Is and Os and follow-up chemistries   8: Hypertension: monitor TID and prn (see #10 meds below)    9: Hyperlipidemia: continue statin   10: Acute systolic CHF (cardiogenic shock>>resolved)             -daily weight             -continue digoxin 0.125 mg daily             -continue spironolactone 25 mg daily                         -continue torsemide 40 mg daily             -continue amiodarone 200 mg BID   12/9- torsemide was reduced over the weekend- weight not done this AM-  11: CAD:             -s/p CABG 12/02             -follow-up with Dr. Laneta Simmers             -continue asa 325  mg daily   12: Atrial fibrillation:             -on Eliquis 5 mg BID             -continue digoxin 0.125 mg daily             -continue amiodarone 200 mg BID   13: morbid  Obesity: BMI = 36.17, provided dietary education  12/9- weight down to 35.07-    14: ABLA: continue Trigels-F Forte daily  -follow-up CBC   12/9- HB up to 7.5 from 7.3- so doing better- con't to monitor 2x/week  15: Hyponatremia: trending up slowly -follow-up BMP   12/9- Na 130- is down from 132, but was 129 the day prior- will monitor-  16: Thrombocytopenia: improving, no bleeding; follow-up CBC   17: Hypokalemia: Klor-Con 20 mEq BID   12/9- will replete 40 meq x2 and start KCL 20 meq tomorrow/daily.  18: Nausea: Reglan 10 mg TID   20: GERD?: Protonix 80 mg  (home on Nexium 40 mg BID)   21: Cough?: on Mucinex 600 mg BID, incentive spirometer ordered   22. Constipation/nausea. LBM 2 days ago. Add sennakot s 1 tab at bedtime. Has PRN sorbitol. Las BM 12/5, added miralax BID  12/9- will give Sorbitol 30 ml x 1 at 1500- and increase Senna  to 1 tab/day.   -  nausea is better- and dizizness Better 23. Orthostasis: discussed hyponatremia could contribute, liberalized diet to regular, decrease torsemide to 30mg   12/9- BP a little soft in 100s- no weight today- weight 127.3 kg- will monitor trend, esp with reduction in Torsemide that was done over weekend.   - Dizziness much improved- per pt  24. Edema: improving, decreased torsemide to 30mg   12/9- LE edema 1-2= - is improving per pt.  25. Cramps: supplement mag and potassium   26. Magnesium deficiency: supplement IV mag 4 grams  12/9- Mg 1.6 yesterday- s/p IV mg- will recheck in AM  27. Insomnia  12/9- increased trazodone to 150 mg at bedtime and added Ambien 5 mg at bedtime prn if needed.    I spent a total of 55   minutes on total care today- >50% coordination of care- due to  Prolonged time writing note; - IPOC- seeing patient - and d/w pharmacy; PA and  therapy- complex medical decision making today.    LOS: 3 days A FACE TO FACE EVALUATION WAS PERFORMED  Etana Beets 01/07/2023, 9:47 AM

## 2023-01-07 NOTE — IPOC Note (Signed)
Overall Plan of Care [facility]) Patient Details Name: Jeremiah Dixon MRN: [id number] DOB: September 12, 1960  Admitting Diagnosis: Debility  Hospital Problems: Principal Problem:   Debility     Functional Problem List: Nursing Bowel, Safety, Endurance, Medication Management, Pain, Skin Integrity  PT Balance, Edema, Endurance, Pain, Safety, Skin Integrity  OT Balance, Endurance, Pain, Safety, Skin Integrity  SLP    TR         Basic ADL's: OT Bathing, Dressing, Toileting     Advanced  ADL's: OT Simple Meal Preparation, Light Housekeeping     Transfers: PT Bed Mobility, Bed to Chair, Customer service manager, Tub/Shower     Locomotion: PT Ambulation, Stairs     Additional Impairments: OT    SLP        TR      Anticipated Outcomes Item Anticipated Outcome  Self Feeding    Swallowing      Basic self-care  Mod I  Toileting  Mod I   Bathroom Transfers Mod I  Bowel/Bladder  manage bowel w mod I assist  Transfers  modI  Locomotion  modI  Communication     Cognition     Pain  < 4 with prns  Safety/Judgment  manage safety w cues   Therapy Plan: PT Intensity: Minimum of 1-2 x/day ,45 to 90 minutes PT Frequency: 5 out of 7 days PT Duration Estimated Length of Stay: 7-10 days OT Intensity: Minimum of 1-2 x/day, 45 to 90 minutes OT Frequency: 5 out of 7 days OT Duration/Estimated Length of Stay: 7-10 days     Team Interventions: Nursing Interventions Patient/Family Education, Disease Management/Prevention, Discharge Planning, Skin Care/Wound Management, Pain Management, Bowel Management, Medication Management  PT interventions Ambulation/gait training, Discharge planning, Functional mobility training, Psychosocial support, Therapeutic Activities, Balance/vestibular training, Disease management/prevention, Neuromuscular re-education, Skin care/wound management, Therapeutic Exercise, DME/adaptive equipment instruction, Pain management, UE/LE Strength taining/ROM, Community  reintegration, Equities trader education, Museum/gallery curator, UE/LE Coordination activities  OT Interventions Warden/ranger, Discharge planning, Pain management, Self Care/advanced ADL retraining, Therapeutic Activities, Disease mangement/prevention, Functional mobility training, Patient/family education, Skin care/wound managment, Therapeutic Exercise, Community reintegration, Fish farm manager, Neuromuscular re-education, Psychosocial support, UE/LE Strength taining/ROM, Splinting/orthotics  SLP Interventions    TR Interventions    SW/CM Interventions     Barriers to Discharge MD  Medical stability, Home enviroment access/loayout, Wound care, Lack of/limited family support, Weight, and Weight bearing restrictions  Nursing Home environment access/layout, Lack of/limited family support home alone w 10/10 2nd story apt bil rails first flight and 1 rail second flight  PT Inaccessible home environment, Decreased caregiver support, Home environment access/layout 2 flights of steps to enter apartment and lives alone  OT Decreased caregiver support, Wound Care, Lack of/limited family support, Weight, Weight bearing restrictions    SLP      SW       Team Discharge Planning: Destination: PT-Home ,OT- Home , SLP-  Projected Follow-up: PT-Outpatient PT, OT-  Other (comment) (Cardiac rehab), SLP-  Projected Equipment Needs: PT-To be determined, OT- To be determined, SLP-  Equipment Details: PT-may need RW for short term, OT-  Patient/family involved in discharge planning: PT- Patient,  OT-Patient, SLP-   MD ELOS: 7-10 dasy Medical Rehab Prognosis:  Good Assessment: The patient has been admitted for CIR therapies with the diagnosis of CABG/cardiac debility. The team will be addressing functional mobility, strength, stamina, balance, safety, adaptive techniques and equipment, self-care, bowel and bladder mgt, patient and caregiver education, . Goals have been set at [facility]  I .  Anticipated discharge destination is home alone with brother intermittent support.        See Team Conference Notes for weekly updates to the plan of care

## 2023-01-07 NOTE — Progress Notes (Signed)
Inpatient Rehabilitation  Patient information reviewed and entered into eRehab system by Oyuki Hogan M. Eri Mcevers, M.A., CCC/SLP, PPS Coordinator.  Information including medical coding, functional ability and quality indicators will be reviewed and updated through discharge.    

## 2023-01-07 NOTE — Progress Notes (Signed)
Patient ID: Jeremiah Dixon, male   DOB: 1961/01/17, 62 y.o.   MRN: [id number]  Inpatient Rehabilitation Care Coordinator Assessment and Plan Patient Details  Name: Jeremiah Dixon MRN: [id number] Date of Birth: [birth year]   Today's Date: 12/20/2022   Hospital Problems: Principal Problem:   Debility   Past Medical History:      Past Medical History:  Diagnosis Date   HLD (hyperlipidemia)     HTN (hypertension)     MI (myocardial infarction) [facility])          Past Surgical History:       Past Surgical History:  Procedure Laterality Date   CARDIOVERSION N/A 12/14/2022    Procedure: CARDIOVERSION (CATH LAB);  Surgeon: Dolores Patty, MD;  Location: [facility] INVASIVE CV LAB;  Service: Cardiovascular;  Laterality: N/A;   RIGHT HEART CATH N/A 12/06/2022    Procedure: RIGHT HEART CATH;  Surgeon: Laurey Morale, MD;  Location: [facility] INVASIVE CV LAB;  Service: Cardiovascular;  Laterality: N/A;   RIGHT/LEFT HEART CATH AND CORONARY ANGIOGRAPHY N/A 12/10/2022    Procedure: RIGHT/LEFT HEART CATH AND CORONARY ANGIOGRAPHY;  Surgeon: Dolores Patty, MD;  Location: [facility] INVASIVE CV LAB;  Service: Cardiovascular;  Laterality: N/A;   TRANSESOPHAGEAL ECHOCARDIOGRAM (CATH LAB) N/A 12/14/2022    Procedure: TRANSESOPHAGEAL ECHOCARDIOGRAM;  Surgeon: Dolores Patty, MD;  Location: [facility] INVASIVE CV LAB;  Service: Cardiovascular;  Laterality: N/A;        Social History:  reports that he has quit smoking. His smoking use included cigarettes. He has never used smokeless tobacco. He reports current alcohol use of about 8.0 standard drinks of alcohol per week. He reports that he does not use drugs.   Family / Support Systems Marital Status: Single Patient Roles: Other (Comment) (brother and uncle) Other Supports: Bill-brother [phone]  Sharilyn Sites [phone] Anticipated Caregiver: Self Ability/Limitations of Caregiver: B[place]her can check on him but not assist with his care. He will need to be mod/i  to be able to go home Caregiver Availability: Intermittent Family Dynamics: Close with brother who is local and he has two nieces but both out of town. 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 He is high level and will not be here long    Abuse/Neglect Abuse/Neglect Assessment Can Be Completed: Yes Physical Abuse: Denies Verbal Abuse: Denies Sexual Abuse: Denies Exploitation of patient/patient's resources: Denies Self-Neglect: Denies   Patient response to: Social Isolation - How often do you feel lonely or isolated from those around you?: Never   Emotional Status Pt's affect, behavior and adjustment status: Pt is still processing all that has happened to him since his admission. 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 He does talk with the MD involved and feels has a good understanding of his treatment plan moving forward. Recent Psychosocial Issues: Has some health issues but felt were managable and has never had anything such as this-SOB Psychiatric History: No history may benefit from seeing neuro-psych while here due to  prepare mentally for his surgery Substance Abuse History: No issues   Patient / Family Perceptions, Expectations & Goals Pt/Family understanding of illness & functional limitations: Pt can explain his heart issues and they all came as a surprise to him and felt at one point he would not make it. He is doing well now and feels more  confident in his abilities now Premorbid pt/family roles/activities: employee, brother uncle, neighbor, friend etc Anticipated changes in roles/activities/participation: resume Pt/family expectations/goals: Pt states; " I hope to do well I have too. I have to rely on myself."   [employer]: None Premorbid Home Care/DME Agencies: None Transportation available at discharge: uses the bus Is the patient able to respond to transportation needs?: Yes In the past 12 months, has lack of transportation kept you from medical appointments or from getting medications?: No In the past 12 months, has lack of transportation kept you from meetings, work, or from getting things needed for daily living?: No Resource referrals recommended: Neuropsychology   Discharge Planning Living Arrangements: Alone Support Systems: Other relatives, Friends/neighbors Type of Residence: Private residence Insurance Resources: Media planner (specify) Counselling psychologist) Financial Resources: Employment, Restaurant manager, fast food Screen Referred: No Living Expenses: Psychologist, sport and exercise Management: Patient Does the patient have any problems obtaining your medications?: No Home Management: self Patient/Family Preliminary Plans: Return home with his brother checking on him, he will need to be mod/i level and be able to walk to the bus stop and up two flights of stairs to his apartment. Aware being evaluated to day and goals being set for stay here. Pt is high levle and will be short LOS Care Coordinator Barriers to Discharge: Decreased caregiver support, Lack of/limited family support Care Coordinator Anticipated Follow Up Needs: HH/OP   Clinical Impression Pleasant gentleman who is motivated to get stronger so he will be prepared for is surgery 12/2. Has limited supports via brother he can noly check on him at home. Will await team's evaluations and work on discharge needs. Have placed on neuro-psych list to be seen  while here   Lalaine Overstreet, Lemar Livings 12/20/2022, 11:09 AM  Addendhum: Pt back on CIR after CABG and plan to return home with intermittent assist from brother. Work on discharge needs. He will need to be mod/I to go home and has two flights of stairs to get into his apartment

## 2023-01-07 NOTE — Progress Notes (Signed)
Physical Therapy Session Note  Patient Details  Name: Jeremiah Dixon MRN: [id number] Date of Birth: 24-Feb-1960  Today's Date: 01/07/2023 PT Individual Time: 1045-1200 PT Individual Time Calculation (min): 75 min   Short Term Goals: Week 1:  PT Short Term Goal 1 (Week 1): STG=LTG due to LOS  Skilled Therapeutic Interventions/Progress Updates:      Therapy Documentation Precautions:  Precautions Precautions: Fall, Sternal Precaution Comments: Verbal review of sternal precautions Restrictions Weight Bearing Restrictions: No RUE Weight Bearing: Non weight bearing LUE Weight Bearing: Non weight bearing Other Position/Activity Restrictions: Sternal Precautions  Pt agreeable to PT session and without reports of pain, limited due to nausea. Nurse notified and adminstered medication for relief.   Pt mod A with sit<>stand from low recliner, attempted to ambulate and deferred due to nausea therefore PT session with emphasis on global LE strength training and PT issued HEP for patient to complete 3 x/daily.   Pt performed following LE in seated position to address strength deficits:   -hip flexion combined with LAQ x 5 bilaterally   -lateral marches over 4 inch yoga block   -lateral marches over 2 inch yoga block   -resisted blue theraband hip abduction   Pt supervision with sit<>stand from elevated bed and CGA/SBA with ambulation to and from restroom with and without RW. Pt left seated at bedside in recliner with all needs in reach.    Therapy/Group: Individual Therapy  [name] [name] PT, DPT  01/07/2023, 7:57 AM

## 2023-01-07 NOTE — Progress Notes (Signed)
Inpatient Rehabilitation Center Individual Statement of Services  Patient Name:  Jeremiah Dixon  Date:  01/07/2023  Welcome to the Inpatient Rehabilitation Center.  Our goal is to provide you with an individualized program based on your diagnosis and situation, designed to meet your specific needs.  With this comprehensive rehabilitation program, you will be expected to participate in at least 3 hours of rehabilitation therapies Monday-Friday, with modified therapy programming on the weekends.  Your rehabilitation program will include the following services:  Physical Therapy (PT), Occupational Therapy (OT), 24 hour per day rehabilitation nursing, Therapeutic Recreaction (TR), Neuropsychology, Care Coordinator, Rehabilitation Medicine, Nutrition Services, and Pharmacy Services  Weekly team conferences will be held on Tuesday to discuss your progress.  Your Inpatient Rehabilitation Care Coordinator will talk with you frequently to get your input and to update you on team discussions.  Team conferences with you and your family in attendance may also be held.  Expected length of stay: 7-10 days  Overall anticipated outcome: Independent with device  Depending on your progress and recovery, your program may change. Your Inpatient Rehabilitation Care Coordinator will coordinate services and will keep you informed of any changes. Your Inpatient Rehabilitation Care Coordinator's name and contact numbers are listed  below.  The following services may also be recommended but are not provided by the Inpatient Rehabilitation Center:   Home Health Rehabiltiation Services Outpatient Rehabilitation Services Vocational Rehabilitation   Arrangements will be made to provide these services after discharge if needed.  Arrangements include referral to agencies that provide these services.  Your insurance has been verified to be:  [place] Your primary doctor is:  Barry Brunner  Pertinent information will be  shared with your doctor and your insurance company.  Inpatient Rehabilitation Care Coordinator:  Dossie Der, [place] [phone] or Luna Glasgow  Information discussed with and copy given to patient by: Lucy Chris, 01/07/2023, 8:26 AM

## 2023-01-08 LAB — GLUCOSE, CAPILLARY: Glucose-Capillary: 98 mg/dL (ref 70–99)

## 2023-01-08 LAB — BASIC METABOLIC PANEL
Anion gap: 9 (ref 5–15)
BUN: 7 mg/dL — ABNORMAL LOW (ref 8–23)
CO2: 28 mmol/L (ref 22–32)
Calcium: 8.7 mg/dL — ABNORMAL LOW (ref 8.9–10.3)
Chloride: 94 mmol/L — ABNORMAL LOW (ref 98–111)
Creatinine, Ser: 0.73 mg/dL (ref 0.61–1.24)
GFR, Estimated: >60 mL/min (ref >60)
Glucose, Bld: 100 mg/dL — ABNORMAL HIGH (ref 70–99)
Potassium: 4.1 mmol/L (ref 3.5–5.1)
Sodium: 131 mmol/L — ABNORMAL LOW (ref 135–145)

## 2023-01-08 LAB — MAGNESIUM: Magnesium: 2.1 mg/dL (ref 1.7–2.4)

## 2023-01-08 MED ORDER — ZOLPIDEM TARTRATE 5 MG PO TABS
10.0000 mg | ORAL_TABLET | Freq: Every evening | ORAL | Status: DC | PRN
  Administered 2023-01-08 – 2023-01-13 (×4): 10 mg via ORAL
  Filled 2023-01-08 (×4): qty 2

## 2023-01-08 NOTE — Progress Notes (Signed)
Physical Therapy Session Note  Patient Details  Name: Jeremiah Dixon MRN: [id number] Date of Birth: 1960/05/20  Today's Date: 01/08/2023 PT Individual Time: 0800-0830, 1300-1400  PT Individual Time Calculation (min): 30 min , 60 min   Short Term Goals: Week 1:  PT Short Term Goal 1 (Week 1): STG=LTG due to LOS  Skilled Therapeutic Interventions/Progress Updates:      Therapy Documentation Precautions:  Precautions Precautions: Fall, Sternal Precaution Comments: Verbal review of sternal precautions Restrictions Weight Bearing Restrictions: No RUE Weight Bearing: Non weight bearing LUE Weight Bearing: Non weight bearing Other Position/Activity Restrictions: Sternal Precautions  Treatment Session 1:   Pt received semi-reclined in bed and without reports of pain. Pt reports he received 3 hrs of sleep but otherwise feels good. Pt supervision with supine>sit from flat mattress without use of bed rails to simulate home set-up. Pt reports bed height elevated at home therefore PT simulated and pt CGA with sit<>stand and gait >300 ft with RW with brief standing rest breaks with cues for energy conservation and pacing throughout the day. Pt returned to room and left seated in recliner at bedside with all needs in reach, declines chair alarm.   Treatment Session 2:   Pt received seated in recliner at bedside, agreeable to PT session with emphasis on transfer training and cardiovascular activity tolerance training. Pt mod A with sit<>stand from recliner and w/c due to fatigue from AM sessions. Pt dependently transported by w/c from room <>dayroom for energy conservation. Pt propelled Nu Step at level three, 3 x 3 minutes with 1 minute rest between for energy conservation. Pt without reports of pain and left seated in recliner at bedside with all needs in reach.    Therapy/Group: Individual Therapy  [name] [name] PT, DPT  01/08/2023, 12:14 PM

## 2023-01-08 NOTE — Progress Notes (Signed)
PROGRESS NOTE   Subjective/Complaints:  Only slept 3 hours again last night and then drowsed from 2am to 5am- woke up at 1:30- increased Ambien to 10 mg at bedtime prn as well.  Maybe related to mood?  Was on no meds at home for sleep.  Used sorbitol and had good BM No nausea right now- thinks was related ot constipation.  Still dizzy/lightheaded when stands up- moderately limiting.  Doing ACE wraps.     ROS:  Still having orthostatic hypotension  Pt denies SOB, abd pain, CP, N/V/C/D, and vision changes    Objective:   VAS [place] LOWER EXTREMITY VENOUS (DVT)  Result Date: 01/07/2023  Lower Venous DVT Study Patient Name:  ARCANGELO ALLUMS  Date of Exam:   01/06/2023 Medical Rec #: [id number]             Accession #:    [id number] Date of Birth: 1961-01-18             Patient Gender: M Patient Age:   62 years Exam Location:  [facility] Procedure:      VAS [place] LOWER EXTREMITY VENOUS (DVT) Referring Phys: Elijah Birk --------------------------------------------------------------------------------  Indications: Edema. Other Indications: Recent open saphenous vein harvest for CABG. Limitations: Edema and bandages. Comparison Study: Prior negative bilateral LEV done 12/06/22 Performing Technologist: Sherren Kerns RVS  Examination Guidelines: A complete evaluation includes B-mode imaging, spectral Doppler, color Doppler, and power Doppler as needed of all accessible portions of each vessel. Bilateral testing is considered an integral part of a complete examination. Limited examinations for reoccurring indications may be performed as noted. The reflux portion of the exam is performed with the patient in reverse Trendelenburg.  +-----+---------------+---------+-----------+----------+--------------+ RIGHTCompressibilityPhasicitySpontaneityPropertiesThrombus Aging  +-----+---------------+---------+-----------+----------+--------------+ CFV  Full           Yes      Yes                                 +-----+---------------+---------+-----------+----------+--------------+   +---------+---------------+---------+-----------+----------+--------------+ LEFT     CompressibilityPhasicitySpontaneityPropertiesThrombus Aging +---------+---------------+---------+-----------+----------+--------------+ CFV      Full           Yes      No                                  +---------+---------------+---------+-----------+----------+--------------+ SFJ      Full           Yes      No                                  +---------+---------------+---------+-----------+----------+--------------+ FV Prox  Full           Yes      Yes                                 +---------+---------------+---------+-----------+----------+--------------+ FV Mid   Full                                                        +---------+---------------+---------+-----------+----------+--------------+  FV DistalFull           Yes      No                                  +---------+---------------+---------+-----------+----------+--------------+ PFV      Full                                                        +---------+---------------+---------+-----------+----------+--------------+ POP      Full           Yes                                          +---------+---------------+---------+-----------+----------+--------------+ PTV      Full                                                        +---------+---------------+---------+-----------+----------+--------------+ PERO     Full                                                        +---------+---------------+---------+-----------+----------+--------------+ GSV      Full                                                         +---------+---------------+---------+-----------+----------+--------------+ Hematoma noted at GSV harvest sites   Summary: RIGHT: - No evidence of common femoral vein obstruction.   LEFT: - There is no evidence of deep vein thrombosis in the lower extremity.  - No cystic structure found in the popliteal fossa. - Hematoma noted at harvest sites  *See table(s) above for measurements and observations. Electronically signed by Coral Else MD on 01/07/2023 at 12:15:36 PM.    Final    Recent Labs    01/07/23 0551  WBC 5.0  HGB 7.5*  HCT 23.0*  PLT 272   Recent Labs    01/07/23 0551 01/08/23 0622  NA 130* 131*  K 3.2* 4.1  CL 93* 94*  CO2 28 28  GLUCOSE 93 100*  BUN 8 7*  CREATININE 0.73 0.73  CALCIUM 8.2* 8.7*    Intake/Output Summary (Last 24 hours) at 01/08/2023 0951 Last data filed at 01/07/2023 1800 Gross per 24 hour  Intake 716 ml  Output --  Net 716 ml        Physical Exam: Vital Signs Blood pressure 107/72, pulse 77, temperature 97.9 F (36.6 C), temperature source Oral, resp. rate 17, height 6\' 3"  (1.905 m), weight 127.8 kg, SpO2 94%.     General: awake, alert, appropriate, sitting up in bed today; NAD HENT: conjugate gaze; oropharynx moist CV: regular rate in afib- but rate controlled; no  JVD Pulmonary: CTA B/L; no W/R/R- good air movement GI: soft, NT, ND, (+)BS- normoactive BS Psychiatric: appropriate Neurological: fair STM- getting better Skin- Abdominal incisions with thick black sutures in place- chest incision healing well + Left lower extremity incisions with sutures intact, well-approximated, mild warmth and edema surrounding but no erythema or active drainage. + Right back laceration as below    Neuro: AAOx4. No apparent cognitive deficits  DTRs: Reflexes were 2+ in bilateral achilles, patella, biceps, BR and triceps. Sensory exam: revealed normal sensation in all dermatomal regions in bilateral upper extremities and bilateral lower  extremities Motor exam: strength 5/5 throughout bilateral upper extremities, bilateral lower extremities, and with exception of 5- out of 5 in bilateral hip flexors while maintaining upper body range of motion restrictions. Coordination: Fine motor coordination was normal.  Stable 12/8     Assessment/Plan: 1. Functional deficits which require 3+ hours per day of interdisciplinary therapy in a comprehensive inpatient rehab setting. Physiatrist is providing close team supervision and 24 hour management of active medical problems listed below. Physiatrist and rehab team continue to assess barriers to discharge/monitor patient progress toward functional and medical goals  Care Tool:  Bathing    Body parts bathed by patient: Right arm, Left arm, Chest, Abdomen, Front perineal area, Buttocks, Right upper leg, Left upper leg, Face, Right lower leg, Left lower leg         Bathing assist Assist Level: Contact Guard/Touching assist     Upper Body Dressing/Undressing Upper body dressing   What is the patient wearing?: Pull over shirt    Upper body assist Assist Level: Set up assist    Lower Body Dressing/Undressing Lower body dressing      What is the patient wearing?: Pants     Lower body assist Assist for lower body dressing: Contact Guard/Touching assist     Toileting Toileting    Toileting assist Assist for toileting: Contact Guard/Touching assist     Transfers Chair/bed transfer  Transfers assist     Chair/bed transfer assist level: Contact Guard/Touching assist     Locomotion Ambulation   Ambulation assist      Assist level: Minimal Assistance - Patient > 75% Assistive device: Walker-rolling Max distance: 200   Walk 10 feet activity   Assist     Assist level: Minimal Assistance - Patient > 75% Assistive device: Walker-rolling   Walk 50 feet activity   Assist    Assist level: Minimal Assistance - Patient > 75% Assistive device: Walker-rolling     Walk 150 feet activity   Assist    Assist level: Minimal Assistance - Patient > 75% Assistive device: Walker-rolling    Walk 10 feet on uneven surface  activity   Assist     Assist level: Minimal Assistance - Patient > 75% Assistive device: Development worker, international aid     Assist Is the patient using a wheelchair?: Yes      Wheelchair assist level: Dependent - Patient 0% Max wheelchair distance: 0    Wheelchair 50 feet with 2 turns activity    Assist        Assist Level: Dependent - Patient 0%   Wheelchair 150 feet activity     Assist  Wheelchair 150 feet activity did not occur:  (limited 2/2 endurance deficit)   Assist Level: Dependent - Patient 0%   Blood pressure 107/72, pulse 77, temperature 97.9 F (36.6 C), temperature source Oral, resp. rate 17, height 6\' 3"  (1.905 m), weight 127.8 kg, SpO2  94%.    Medical Problem List and Plan: 1. Functional deficits secondary to cardiac debility status post CABG             -patient may not shower unless abdominal and LLE incisions covered             -ELOS/Goals: 7 to 10 days, modified independent PT/OT   Con't CIR PT and OT  Team conference today to determine length of stay 2.  Antithrombotics: -DVT/anticoagulation:  Pharmaceutical: Eliquis             -antiplatelet therapy: Aspirin 81 mg daily             - LLE duplex on admission for increasing edema   3. Pain Management: Tylenol, tramadol as needed   12/9- denied significant pain- just soreness  4. Mood/Behavior/Sleep: LCSW to evaluate and provide emotional support             -trazodone 100 mg q HS prn             -antipsychotic agents: n/a   5. Neuropsych/cognition: This patient IS capable of making decisions on HIS own behalf.   6. Skin/Wound Care: Routine skin care checks   7. Fluids/Electrolytes/Nutrition: Strict Is and Os and follow-up chemistries   8: Hypertension- with hypotension: monitor TID and prn (see #10 meds below)     12/10- Hypotension- have called Cards to see if can get help on whether or not to add Midodrine, or reduce meds to help with orthostatic hypotension- occurring when stands, which is limiting therapy 9: Hyperlipidemia: continue statin   10: Acute systolic CHF (cardiogenic shock>>resolved)             -daily weight             -continue digoxin 0.125 mg daily             -continue spironolactone 25 mg daily                         -continue torsemide 40 mg daily             -continue amiodarone 200 mg BID   12/9- torsemide was reduced over the weekend- weight not done this AM-   12/10-- weight not done- was 127.8 yesterday- will ask nursing to weigh daily.  11: CAD:             -s/p CABG 12/02             -follow-up with Dr. Laneta Simmers             -continue asa 325 mg daily   12: Atrial fibrillation:             -on Eliquis 5 mg BID             -continue digoxin 0.125 mg daily             -continue amiodarone 200 mg BID   13: morbid  Obesity: BMI = 36.17, provided dietary education  12/9- weight down to 35.07-    14: ABLA: continue Trigels-F Forte daily  -follow-up CBC   12/9- HB up to 7.5 from 7.3- so doing better- con't to monitor 2x/week  15: Hyponatremia: trending up slowly -follow-up BMP   12/9- Na 130- is down from 132, but was 129 the day prior- will monitor-   12/10- Na 131 16: Thrombocytopenia: improving, no bleeding; follow-up CBC    17: Hypokalemia: Klor-Con 20  mEq BID   12/9- will replete 40 meq x2 and start KCL 20 meq tomorrow/daily.   12/10- K+ 4.1-  18: Nausea: Reglan 10 mg TID   20: GERD?: Protonix 80 mg  (home on Nexium 40 mg BID)   21: Cough?: on Mucinex 600 mg BID, incentive spirometer ordered   22. Constipation/nausea. LBM 2 days ago. Add sennakot s 1 tab at bedtime. Has PRN sorbitol. Las BM 12/5, added miralax BID  12/9- will give Sorbitol 30 ml x 1 at 1500- and increase Senna  to 1 tab/day.   -nausea is better- and dizizness Better  12/10- thinks nausea  was due to constipation, which has resolved 23. Orthostasis: discussed hyponatremia could contribute, liberalized diet to regular, decrease torsemide to 30mg   12/9- BP a little soft in 100s- no weight today- weight 127.3 kg- will monitor trend, esp with reduction in Torsemide that was done over weekend.   - Dizziness much improved- per pt  24. Edema: improving, decreased torsemide to 30mg   12/9- LE edema 1-2= - is improving per pt.   12/10- stable- 2-3+ on LLE- but 1-2+ on RLE-  25. Cramps: supplement mag and potassium   26. Magnesium deficiency: supplement IV mag 4 grams  12/9- Mg 1.6 yesterday- s/p IV mg- will recheck in AM  12/10- Mg 2.1 27. Insomnia  12/9- increased trazodone to 150 mg at bedtime and added Ambien 5 mg at bedtime prn if needed.   12/10- changed Ambien to 10 mg at bedtime prn- and con't Trazodone- waking up ~ 1:30 am nightly for past few nights- didn't take sleep meds at home.    I spent a total of  53  minutes on total care today- >50% coordination of care- due to  D/w cards x2- also d/w nursing about weights and team conference to determine length of stay.   LOS: 4 days A FACE TO FACE EVALUATION WAS PERFORMED  Emmarae Cowdery 01/08/2023, 9:51 AM

## 2023-01-08 NOTE — Progress Notes (Signed)
Occupational Therapy Session Note  Patient Details  Name: Jeremiah Dixon MRN: [id number] Date of Birth: [birth year]  Today's Date: 01/08/2023 OT Individual Time: 0900-1015 & 1445-1530 OT Individual Time Calculation (min): 75 min & 45 min   Short Term Goals: Week 1:  OT Short Term Goal 1 (Week 1): STGs=LTGs due to patient's estimated length of stay.  Skilled Therapeutic Interventions/Progress Updates:      Therapy Documentation Precautions:  Precautions Precautions: Fall, Sternal Precaution Comments: Verbal review of sternal precautions Restrictions Weight Bearing Restrictions: No RUE Weight Bearing: Non weight bearing LUE Weight Bearing: Non weight bearing Other Position/Activity Restrictions: Sternal Precautions Session 1 General: "Hello Malcolm Hetz!" Pt seated in recliner upon OT arrival, agreeable to OT.  Pain: no pain reported  ADL: Pt donned pants and socks SBA seated in chair before completing mobility.  Exercises: Pt demonstrated ambulating with RW at SBA in order to prepare for community integration at D/C. Pt requiring several extended rest breaks d/t low activity tolerance. Pt with increased ability to complete sit to stands from low surfaces with Min A occasional, mostly CGA. OT completed task with pt focus on energy conservation strategies while ambulating.    Pt seated in recliner at end of session with W/C alarm donned, call light within reach and 4Ps assessed.    Session 2 General: "Cathlean Sauer!" Pt seated in W/C upon OT arrival, agreeable to OT.  Pain: no pain reported  ADL: Pt completed ADL with SBA overall whil standing in shower. Pt with intermittent use of grab bars with all incisions covered. No LOB/SOB. Pt able to reach out of BOS to ground in order to retrieve soap from floor at CGA no LOB. Pt sitting on TTB in order to complete figure 4 for managing pants one legs. Noted pt still able to complete shower in standing after full day of therapy. Pt reports  having grab bars in shower.    Pt seated in recliner at end of session with W/C alarm donned, call light within reach and 4Ps assessed.   Therapy/Group: Individual Therapy  Velia Meyer, OTD, OTR/L 01/08/2023, 3:59 PM

## 2023-01-08 NOTE — Progress Notes (Signed)
Patient ID: Jeremiah Dixon, male   DOB: [birth year], 62 y.o.   MRN: [id number]  Met with pt and brother and sister in-law who are present in his room. He gave permission to speak in front of family. Gave team conference update regarding goals of mod/I level and target discharge date of 12/16. Pt feels he is doing well but is concerned about his weakness and lack of strength. His brother will be assisting at discharge with transport, groceries, laundry, etc. Pt will be home alone. Discussed rolling walker and OP therapies. Will continue to work on discharge needs. Pt hopes to not need a walker by discharge.

## 2023-01-08 NOTE — Patient Care Conference (Signed)
Inpatient RehabilitationTeam Conference and Plan of Care Update Date: 01/08/2023   Time: 11:39 AM    Patient Name: Jeremiah Dixon      Medical Record Number: [id number]  Date of Birth: [birth year] Sex: Male         Room/Bed: 4W26C/4W26C-01 Payor Info: Payor: [employer] / Plan: CIG[employer] MANAGED / Product Type: *No Product type* /    Admit Date/Time:  01/04/2023  3:42 PM  Primary Diagnosis:  Debility  Hospital Problems: Principal Problem:   Debility    Expected Discharge Date: Expected Discharge Date: 01/14/23  Team Members Present: Physician leading conference: Dr. Genice Rouge Social Worker Present: Dossie Der, LCSW Nurse Present: Chana Bode, RN PT Present: [name], PT OT Present: Velia Meyer, OT     Current Status/Progress Goal Weekly Team Focus  Bowel/Bladder   Continent of B/B, LBM 01/05/23   Remain continent while on rehab.   Assist with bathroom privileges.    Swallow/Nutrition/ Hydration               ADL's   SBA all ADLs, CGA for transfers with no AD   mod I   increase activity tolerance/endurance, energy conservation strategies    Mobility   CGA/min bed, SBA-mod sit<>stand, CGA gait >200 ft   Mod I  energy conservation with gait    Communication                Safety/Cognition/ Behavioral Observations               Pain   Generalized pain. Intervention per pain regimen, with good effect.   Keep pain level <1/10   assess pain q 4 and prn    Skin   CDI   Maintain skin integrity  Assess skin q shift and prn      Discharge Planning:  Home alone with intermittent assist from brother-he will do grocery shopping, laundry and meals. Pt will need to be mod/i level prior to DC   Team Discussion: Patient with debility; limited by insomnia, poor memory, (anoxic brain injury) and orthostasis/soft BP with poor endurance and decreased activity tolerance.   Patient on target to meet rehab goals: yes, currently needs supervision for  ADLs and min-mod assist for sit - stand but able to ambulate up to 200' with CGA using a RW.   *See Care Plan and progress notes for long and short-term goals.   Revisions to Treatment Plan:  Medication adjustment for insomnia   Teaching Needs: Safety, medications, energy conservation, transfers, toileting, etc.   Current Barriers to Discharge: Decreased caregiver support and Home enviroment access/layout  Possible Resolutions to Barriers: Family education DME: RW     Medical Summary Current Status: incision looksOK- conitnent gauze for LLE-  Barriers to Discharge: Weight bearing restrictions;Cardiac Complications;Behavior/Mood;Complicated Wound;Electrolyte abnormality;Hypotension;Medical stability;Self-care education;Volume Overload  Barriers to Discharge Comments: brother to help with laundry, groceries, etc limited by: endurance - has 2 flights of stairs p-insomnia is severe- and orthostaitc hypteosnsion Possible Resolutions to [employer] Focus: pt - increase Ambien and trazodsodne- will call Cards for Orthostatatic hypotension; and d/c 12/16- outpatient and RW- might need tub transfer bench   Continued Need for Acute Rehabilitation Level of Care: The patient requires daily medical management by a physician with specialized training in physical medicine and rehabilitation for the following reasons: Direction of a multidisciplinary physical rehabilitation program to maximize functional independence : Yes Medical management of patient stability for increased activity during participation in an intensive rehabilitation regime.: Yes Analysis of  laboratory values and/or radiology reports with any subsequent need for medication adjustment and/or medical intervention. : Yes   I attest that I was present, lead the team conference, and concur with the assessment and plan of the team.   Chana Bode B 01/08/2023, 2:26 PM

## 2023-01-09 DIAGNOSIS — R4589 Other symptoms and signs involving emotional state: Secondary | ICD-10-CM

## 2023-01-09 DIAGNOSIS — R5381 Other malaise: Principal | ICD-10-CM

## 2023-01-09 MED ORDER — METOPROLOL SUCCINATE ER 25 MG PO TB24
12.5000 mg | ORAL_TABLET | Freq: Every day | ORAL | Status: DC
  Administered 2023-01-09 – 2023-01-14 (×6): 12.5 mg via ORAL
  Filled 2023-01-09 (×6): qty 1

## 2023-01-09 MED ORDER — SPIRONOLACTONE 12.5 MG HALF TABLET
12.5000 mg | ORAL_TABLET | Freq: Every day | ORAL | Status: DC
  Administered 2023-01-09 – 2023-01-14 (×6): 12.5 mg via ORAL
  Filled 2023-01-09 (×6): qty 1

## 2023-01-09 MED FILL — Lidocaine HCl Local Soln Prefilled Syringe 100 MG/5ML (2%): INTRAMUSCULAR | Qty: 5 | Status: AC

## 2023-01-09 MED FILL — Sodium Bicarbonate IV Soln 8.4%: INTRAVENOUS | Qty: 50 | Status: AC

## 2023-01-09 MED FILL — Mannitol IV Soln 20%: INTRAVENOUS | Qty: 500 | Status: AC

## 2023-01-09 MED FILL — Sodium Chloride IV Soln 0.9%: INTRAVENOUS | Qty: 2000 | Status: AC

## 2023-01-09 MED FILL — Electrolyte-R (PH 7.4) Solution: INTRAVENOUS | Qty: 5000 | Status: AC

## 2023-01-09 MED FILL — Heparin Sodium (Porcine) Inj 1000 Unit/ML: INTRAMUSCULAR | Qty: 20 | Status: AC

## 2023-01-09 NOTE — Progress Notes (Signed)
Occupational Therapy Session Note  Patient Details  Name: Jeremiah Dixon MRN: [id number] Date of Birth: 25-Jul-1960  Today's Date: 01/09/2023 OT Individual Time: 1100-1200 & 4098-1191 OT Individual Time Calculation (min): 60 min & 72 min   Short Term Goals: Week 1:  OT Short Term Goal 1 (Week 1): STGs=LTGs due to patient's estimated length of stay.  Skilled Therapeutic Interventions/Progress Updates:      Therapy Documentation Precautions:  Precautions Precautions: Fall, Sternal Precaution Comments: Verbal review of sternal precautions Restrictions Weight Bearing Restrictions: No RUE Weight Bearing: Non weight bearing LUE Weight Bearing: Non weight bearing Other Position/Activity Restrictions: Sternal Precautions Session 1 General: "Hey there!" Pt seated in recliner upon OT arrival, agreeable to OT.  Pain: no pain reported  Exercises: Pt practiced sit to stands from low surfaces with SBA and increased leg strength to complete task with increased form. Pt completed 10 reps altogether,  before leaving the room, then ambulated with RW from room>therapy gym then completed 5 more reps from standard chair for increase of endurance and activity tolerance.  Other Treatments: OT applied ACE wrapping to BLE for edema management. OT also set up pt at end of session with BLE elevation for edema management with placement of W/C at end of recliner d/t long legs to put feet on with pillows for elevation.  Pt seated in recliner at end of session with W/C alarm donned, call light within reach and 4Ps assessed.    Session 2 General: "I have a joke for you!" Pt seated in recliner upon OT arrival, agreeable to OT.  Pain: no pain reported  ADL: Pt practicing stair navigation in stairwell, completing 5 ascending and descending steps with sideways navigation with BUE on hand rail. Pt then required rest break, then completed 2 sets of 2 steps ascending and descending with rest break after  activity. Pt completed at [facility] with slow movements.  Exercises: Pt completed 10 minutes of nu step bike backward in order to increase BUE/BLEstrength and endurance in preparation for increased independence in ADLs such as step navigation. No Rest break on level 5 resistance.  Other Treatments: Pt had therapeutic discussion about healing d/t pt frustration on energy levels. OT discussed that pt had major surgery and body is using energy to heal from surgery and gain strength.   Pt seated in W/C at end of session with W/C alarm donned, call light within reach and 4Ps assessed.    Therapy/Group: Individual Therapy  Velia Meyer, OTD, OTR/L 01/09/2023, 12:18 PM

## 2023-01-09 NOTE — Progress Notes (Signed)
PROGRESS NOTE   Subjective/Complaints:  Pt reports slept a little better- we discussed how cannot increase sleeping meds.  Also asking questions about afib- how to detect, etc.  Did very well in therap-y notes his poor endurance, however- wants ot walk every therapy, but explained we don't want ot wear him out.  Still having orthostatic hypotension- explained spoke with Cards and reduction of meds ROS:  Still having orthostatic hypotension   Pt denies SOB, abd pain, CP, N/V/C/D, and vision changes     Objective:   No results found. Recent Labs    01/07/23 0551  WBC 5.0  HGB 7.5*  HCT 23.0*  PLT 272   Recent Labs    01/07/23 0551 01/08/23 0622  NA 130* 131*  K 3.2* 4.1  CL 93* 94*  CO2 28 28  GLUCOSE 93 100*  BUN 8 7*  CREATININE 0.73 0.73  CALCIUM 8.2* 8.7*    Intake/Output Summary (Last 24 hours) at 01/09/2023 0836 Last data filed at 01/08/2023 1700 Gross per 24 hour  Intake 472 ml  Output --  Net 472 ml        Physical Exam: Vital Signs Blood pressure 110/70, pulse 75, temperature 97.6 F (36.4 C), resp. rate 17, height 6\' 3"  (1.905 m), weight 126.3 kg, SpO2 95%.      General: awake, alert, appropriate, sitting up in bed; eating breakfast; NAD HENT: conjugate gaze; oropharynx moist CV: regular rate and rhythm; no JVD Pulmonary: CTA B/L; no W/R/R- good air movement GI: soft, NT, ND, (+)BS- slightly hypoactive Psychiatric: appropriate Neurological: fait STM- needed a lot of repetition to understand A fib issues Skin- Abdominal incisions with thick black sutures in place- chest incision healing well + Left lower extremity incisions with sutures intact, well-approximated, mild warmth and edema surrounding but no erythema or active drainage. + Right back laceration as below    Neuro: AAOx4. No apparent cognitive deficits  DTRs: Reflexes were 2+ in bilateral achilles, patella, biceps, BR  and triceps. Sensory exam: revealed normal sensation in all dermatomal regions in bilateral upper extremities and bilateral lower extremities Motor exam: strength 5/5 throughout bilateral upper extremities, bilateral lower extremities, and with exception of 5- out of 5 in bilateral hip flexors while maintaining upper body range of motion restrictions. Coordination: Fine motor coordination was normal.  Stable 12/8     Assessment/Plan: 1. Functional deficits which require 3+ hours per day of interdisciplinary therapy in a comprehensive inpatient rehab setting. Physiatrist is providing close team supervision and 24 hour management of active medical problems listed below. Physiatrist and rehab team continue to assess barriers to discharge/monitor patient progress toward functional and medical goals  Care Tool:  Bathing    Body parts bathed by patient: Right arm, Left arm, Chest, Abdomen, Front perineal area, Buttocks, Right upper leg, Left upper leg, Face, Right lower leg, Left lower leg         Bathing assist Assist Level: Contact Guard/Touching assist     Upper Body Dressing/Undressing Upper body dressing   What is the patient wearing?: Pull over shirt    Upper body assist Assist Level: Set up assist    Lower Body Dressing/Undressing Lower  body dressing      What is the patient wearing?: Pants     Lower body assist Assist for lower body dressing: Contact Guard/Touching assist     Toileting Toileting    Toileting assist Assist for toileting: Contact Guard/Touching assist     Transfers Chair/bed transfer  Transfers assist     Chair/bed transfer assist level: Contact Guard/Touching assist     Locomotion Ambulation   Ambulation assist      Assist level: Minimal Assistance - Patient > 75% Assistive device: Walker-rolling Max distance: 200   Walk 10 feet activity   Assist     Assist level: Minimal Assistance - Patient > 75% Assistive device:  Walker-rolling   Walk 50 feet activity   Assist    Assist level: Minimal Assistance - Patient > 75% Assistive device: Walker-rolling    Walk 150 feet activity   Assist    Assist level: Minimal Assistance - Patient > 75% Assistive device: Walker-rolling    Walk 10 feet on uneven surface  activity   Assist     Assist level: Minimal Assistance - Patient > 75% Assistive device: Development worker, international aid     Assist Is the patient using a wheelchair?: Yes      Wheelchair assist level: Dependent - Patient 0% Max wheelchair distance: 0    Wheelchair 50 feet with 2 turns activity    Assist        Assist Level: Dependent - Patient 0%   Wheelchair 150 feet activity     Assist  Wheelchair 150 feet activity did not occur:  (limited 2/2 endurance deficit)   Assist Level: Dependent - Patient 0%   Blood pressure 110/70, pulse 75, temperature 97.6 F (36.4 C), resp. rate 17, height 6\' 3"  (1.905 m), weight 126.3 kg, SpO2 95%.    Medical Problem List and Plan: 1. Functional deficits secondary to cardiac debility status post CABG             -patient may not shower unless abdominal and LLE incisions covered             -ELOS/Goals: 7 to 10 days, modified independent PT/OT   D/c  12/16 Con't CIR PT and OT Educated pt at length about Afib and how to feel having it; how to diagnose it himself and how to address it  my concern is if gets >110 heart rate- d/w pt.  2.  Antithrombotics: -DVT/anticoagulation:  Pharmaceutical: Eliquis             -antiplatelet therapy: Aspirin 81 mg daily             - LLE duplex on admission for increasing edema   3. Pain Management: Tylenol, tramadol as needed   12/9- denied significant pain- just soreness  4. Mood/Behavior/Sleep: LCSW to evaluate and provide emotional support             -trazodone 100 mg q HS prn             -antipsychotic agents: n/a   5. Neuropsych/cognition: This patient IS capable of making  decisions on HIS own behalf.   6. Skin/Wound Care: Routine skin care checks   7. Fluids/Electrolytes/Nutrition: Strict Is and Os and follow-up chemistries   8: Hypertension- with hypotension: monitor TID and prn (see #10 meds below)    12/10- Hypotension- have called Cards to see if can get help on whether or not to add Midodrine, or reduce meds to help with orthostatic hypotension- occurring  when stands, which is limiting therapy  12/11- Reduced Aldactone to 12.5 mg daily and Toprol to 12. 5mg  daily- will wait to  reduce Torsemide but can reduce to 20 mg daily, per Cards.  9: Hyperlipidemia: continue statin   10: Acute systolic CHF (cardiogenic shock>>resolved)             -daily weight             -continue digoxin 0.125 mg daily             -continue spironolactone 25 mg daily                         -continue torsemide 40 mg daily             -continue amiodarone 200 mg BID   12/9- torsemide was reduced over the weekend- weight not done this AM-   12/10-- weight not done- was 127.8 yesterday- will ask nursing to weigh daily.   12/11- weight 126/3 kg- doing better 11: CAD:             -s/p CABG 12/02             -follow-up with Dr. Laneta Simmers             -continue asa 325 mg daily   12: Atrial fibrillation:             -on Eliquis 5 mg BID             -continue digoxin 0.125 mg daily             -continue amiodarone 200 mg BID   13: morbid  Obesity: BMI = 36.17, provided dietary education  12/9- weight down to 35.07-    14: ABLA: continue Trigels-F Forte daily  -follow-up CBC   12/9- HB up to 7.5 from 7.3- so doing better- con't to monitor 2x/week  15: Hyponatremia: trending up slowly -follow-up BMP   12/9- Na 130- is down from 132, but was 129 the day prior- will monitor-   12/10- Na 131 16: Thrombocytopenia: improving, no bleeding; follow-up CBC   1211- Plts up to 272k 17: Hypokalemia: Klor-Con 20 mEq BID   12/9- will replete 40 meq x2 and start KCL 20 meq tomorrow/daily.    12/10- K+ 4.1-  18: Nausea: Reglan 10 mg TID   12/11- resolved- will look into reducing reglan- will d/w pt 20: GERD?: Protonix 80 mg  (home on Nexium 40 mg BID)   21: Cough?: on Mucinex 600 mg BID, incentive spirometer ordered   22. Constipation/nausea. LBM 2 days ago. Add sennakot s 1 tab at bedtime. Has PRN sorbitol. Las BM 12/5, added miralax BID  12/9- will give Sorbitol 30 ml x 1 at 1500- and increase Senna  to 1 tab/day.   -nausea is better- and dizizness Better  12/10- thinks nausea was due to constipation, which has resolved 23. Orthostasis: discussed hyponatremia could contribute, liberalized diet to regular, decrease torsemide to 30mg   12/9- BP a little soft in 100s- no weight today- weight 127.3 kg- will monitor trend, esp with reduction in Torsemide that was done over weekend.   -12/11- Will decrease Aldactone to 12.5 mg dail yand Toprol to 12.5 mg daily- per d/w Cards-  24. Edema: improving, decreased torsemide to 30mg   12/9- LE edema 1-2= - is improving per pt.   12/10- stable- 2-3+ on LLE- but 1-2+ on RLE-  25. Cramps: supplement mag and  potassium   26. Magnesium deficiency: supplement IV mag 4 grams  12/9- Mg 1.6 yesterday- s/p IV mg- will recheck in AM  12/10- Mg 2.1 27. Insomnia  12/9- increased trazodone to 150 mg at bedtime and added Ambien 5 mg at bedtime prn if needed.   12/10- changed Ambien to 10 mg at bedtime prn- and con't Trazodone- waking up ~ 1:30 am nightly for past few nights- didn't take sleep meds at home. 12/11- slept "slightly better'- explained don't feel comfortable increasing sleeping meds due to high dose of them - he voiced understanding  I spent a total of 52    minutes on total care today- >50% coordination of care- due to  D/w Cards; extended d/w pt about Afib; and sleeping meds- and d/w nursing about plan.   LOS: 5 days A FACE TO FACE EVALUATION WAS PERFORMED  Janani Chamber 01/09/2023, 8:36 AM

## 2023-01-09 NOTE — Progress Notes (Signed)
Physical Therapy Session Note  Patient Details  Name: Jeremiah Dixon MRN: [id number] Date of Birth: 05-10-1960  Today's Date: 01/09/2023 PT Individual Time: 0900-1015 PT Individual Time Calculation (min): 75 min   Short Term Goals: Week 1:  PT Short Term Goal 1 (Week 1): STG=LTG due to LOS  Skilled Therapeutic Interventions/Progress Updates:      Therapy Documentation Precautions:  Precautions Precautions: Fall, Sternal Precaution Comments: Verbal review of sternal precautions Restrictions Weight Bearing Restrictions: No RUE Weight Bearing: Non weight bearing LUE Weight Bearing: Non weight bearing Other Position/Activity Restrictions: Sternal Precautions  Pt received semi-reclined in bed and reports improved sleep and requests shower. PT received instruction from OT on how to appropriately cover wounds and incisions and PT applied in recommended manner.   Pt supervision with bed mobility, sit<>stand from elevated bed, ambulatory transfer no AD to shower. Pt performed total body bathing with supervision in standing to address global activity tolerance and strength deficits.   Pt performed self-grooming tasks at sink in standing position along with full body dressing (supervision-min A)  and able to tolerate standing >30 minutes demonstrating improved endurance.   Pt dependently transported by w/c for energy conservation and performed kinetron at 60 cm/second 5 x 45 seconds with 45 second rest break in between each bout to address LE strength and activity tolerance deficits.   Pt left seated in recliner at bedside with all needs in reach. Pt without reports of pain.    Therapy/Group: Individual Therapy  [name] [name] PT, DPT  01/09/2023, 9:30 AM

## 2023-01-10 DIAGNOSIS — K5900 Constipation, unspecified: Secondary | ICD-10-CM

## 2023-01-10 DIAGNOSIS — I10 Essential (primary) hypertension: Secondary | ICD-10-CM

## 2023-01-10 DIAGNOSIS — I4891 Unspecified atrial fibrillation: Secondary | ICD-10-CM

## 2023-01-10 DIAGNOSIS — E876 Hypokalemia: Secondary | ICD-10-CM

## 2023-01-10 LAB — CBC WITH DIFFERENTIAL/PLATELET
Abs Immature Granulocytes: 0.03 10*3/uL (ref 0.00–0.07)
Basophils Absolute: 0 10*3/uL (ref 0.0–0.1)
Basophils Relative: 0 %
Eosinophils Absolute: 0.3 10*3/uL (ref 0.0–0.5)
Eosinophils Relative: 5 %
HCT: 23.9 % — ABNORMAL LOW (ref 39.0–52.0)
Hemoglobin: 7.9 g/dL — ABNORMAL LOW (ref 13.0–17.0)
Immature Granulocytes: 1 %
Lymphocytes Relative: 20 %
Lymphs Abs: 1.1 10*3/uL (ref 0.7–4.0)
MCH: 31.3 pg (ref 26.0–34.0)
MCHC: 33.1 g/dL (ref 30.0–36.0)
MCV: 94.8 fL (ref 80.0–100.0)
Monocytes Absolute: 0.6 10*3/uL (ref 0.1–1.0)
Monocytes Relative: 11 %
Neutro Abs: 3.6 10*3/uL (ref 1.7–7.7)
Neutrophils Relative %: 63 %
Platelets: 365 10*3/uL (ref 150–400)
RBC: 2.52 MIL/uL — ABNORMAL LOW (ref 4.22–5.81)
RDW: 14.1 % (ref 11.5–15.5)
WBC: 5.6 10*3/uL (ref 4.0–10.5)
nRBC: 0 % (ref 0.0–0.2)

## 2023-01-10 LAB — BASIC METABOLIC PANEL
Anion gap: 9 (ref 5–15)
BUN: 7 mg/dL — ABNORMAL LOW (ref 8–23)
CO2: 27 mmol/L (ref 22–32)
Calcium: 8.5 mg/dL — ABNORMAL LOW (ref 8.9–10.3)
Chloride: 95 mmol/L — ABNORMAL LOW (ref 98–111)
Creatinine, Ser: 0.81 mg/dL (ref 0.61–1.24)
GFR, Estimated: >60 mL/min (ref >60)
Glucose, Bld: 93 mg/dL (ref 70–99)
Potassium: 3.8 mmol/L (ref 3.5–5.1)
Sodium: 131 mmol/L — ABNORMAL LOW (ref 135–145)

## 2023-01-10 LAB — MAGNESIUM: Magnesium: 1.9 mg/dL (ref 1.7–2.4)

## 2023-01-10 LAB — DIGOXIN LEVEL: Digoxin Level: 0.8 ng/mL (ref 0.8–2.0)

## 2023-01-10 MED ORDER — BENZONATATE 100 MG PO CAPS
100.0000 mg | ORAL_CAPSULE | Freq: Two times a day (BID) | ORAL | Status: AC
  Administered 2023-01-10 – 2023-01-12 (×6): 100 mg via ORAL
  Filled 2023-01-10 (×6): qty 1

## 2023-01-10 MED ORDER — POTASSIUM CHLORIDE CRYS ER 20 MEQ PO TBCR
20.0000 meq | EXTENDED_RELEASE_TABLET | Freq: Once | ORAL | Status: AC
  Administered 2023-01-10: 20 meq via ORAL
  Filled 2023-01-10: qty 1

## 2023-01-10 NOTE — Plan of Care (Signed)
  Problem: Consults Goal: RH GENERAL PATIENT EDUCATION Description: See Patient Education module for education specifics. Outcome: Progressing   Problem: RH BOWEL ELIMINATION Goal: RH STG MANAGE BOWEL WITH ASSISTANCE Description: STG Manage Bowel with mod I Assistance. Outcome: Progressing Goal: RH STG MANAGE BOWEL W/MEDICATION W/ASSISTANCE Description: STG Manage Bowel with Medication with mod I Assistance. Outcome: Progressing   Problem: RH SKIN INTEGRITY Goal: RH STG SKIN FREE OF INFECTION/BREAKDOWN Description: Manage w min assist Outcome: Progressing Goal: RH STG MAINTAIN SKIN INTEGRITY WITH ASSISTANCE Description: STG Maintain Skin Integrity With min Assistance. Outcome: Progressing   Problem: RH SAFETY Goal: RH STG ADHERE TO SAFETY PRECAUTIONS W/ASSISTANCE/DEVICE Description: STG Adhere to Safety Precautions With cues Assistance/Device. Outcome: Progressing   Problem: RH PAIN MANAGEMENT Goal: RH STG PAIN MANAGED AT OR BELOW PT'S PAIN GOAL Description: < 4 with prns Outcome: Progressing   Problem: RH KNOWLEDGE DEFICIT GENERAL Goal: RH STG INCREASE KNOWLEDGE OF SELF CARE AFTER HOSPITALIZATION Description: Patient will be able to manage care at discharge with medications and dietary modifications using educational resources independently Outcome: Progressing

## 2023-01-10 NOTE — Progress Notes (Signed)
Physical Therapy Session Note  Patient Details  Name: Jeremiah Dixon MRN: [id number] Date of Birth: 06-16-1960  Today's Date: 01/10/2023 PT Individual Time: 0800-0915 PT Individual Time Calculation (min): 75 min   Short Term Goals: Week 1:  PT Short Term Goal 1 (Week 1): STG=LTG due to LOS  Skilled Therapeutic Interventions/Progress Updates:      Therapy Documentation Precautions:  Precautions Precautions: Fall, Sternal Precaution Comments: Verbal review of sternal precautions Restrictions Weight Bearing Restrictions Per Provider Order: No RUE Weight Bearing Per Provider Order: Non weight bearing LUE Weight Bearing Per Provider Order: Non weight bearing Other Position/Activity Restrictions: Sternal Precautions  Pt received semi-reclined in bed, agreeable to PT session with emphasis on stair and transfer training for global strength training and to prepare for discharge. Pt without reports of pain in session and requested AM medications, PT notified nurse who adminstered medications.   Pt supervision within room with bed mobility and ambulatory transfer to toilet. Pt dependently transported for time management to stairwell and navigated 7.5 inch steps x 4 (3 trials). PT re-educated on importance of energy conservation and pacing.   Pt required 23 seconds to complete five time sit<>stand and demonstrates increased fall risk and would benefit from RW.   Pt left seated in recliner at bedside with all needs in reach.     Therapy/Group: Individual Therapy  [name] [name] PT, DPT  01/10/2023, 10:11 AM

## 2023-01-10 NOTE — Progress Notes (Signed)
Occupational Therapy Session Note  Patient Details  Name: Jeremiah Dixon MRN: [id number] Date of Birth: 01-17-1961  Today's Date: 01/10/2023 OT Individual Time:1005-1102 & [id number] OT Individual Time Calculation (min): 57 min & 73 min    Short Term Goals: Week 1:  OT Short Term Goal 1 (Week 1): STGs=LTGs due to patient's estimated length of stay.  Skilled Therapeutic Interventions/Progress Updates:      Therapy Documentation Precautions:  Precautions Precautions: Fall, Sternal Precaution Comments: Verbal review of sternal precautions Restrictions Weight Bearing Restrictions Per Provider Order: No RUE Weight Bearing Per Provider Order: Non weight bearing LUE Weight Bearing Per Provider Order: Non weight bearing Other Position/Activity Restrictions: Sternal Precautions Session 1 General: "Hello!" Pt seated in W/C upon OT arrival, agreeable to OT.  Pain: no pain reported   ADL: Pt completed full ADL/shower/functional mobility throughout room at SBA. Pt completed shower in standing with all incisions covered. Pt able to doff pants in standing at grab bar with SBA then able to don Lt leg in standing with similar technique but seated using figure 4 for RLE . OT emphasizing energy conservation during ADL tasks for preparation for D/C.   Exercises: Pt completed 2x5 sets of squats with SBA with emphasis on eccentric control back down to chair with good carryover and safe technique.   Other Treatments: OT ACE wrapped pt's legs and educated about elevating LE d/t edema in legs/feet.    Pt seated in W/C at end of session with W/C alarm donned, call light within reach and 4Ps assessed.    Session 2 General: "This was the best session yet." Pt seated in W/C upon OT arrival, agreeable to OT.  Pain: no pain reported  ADL: Pt ambulated from room>stairwell on MW unit with RW at SBA. Pt completed stair navigation of 7.5 inch steps x 5 (5 trials) ascending and descending in  sideways manner with BUE on hand rail.  Pt with no complaints of LE weakness during trials. Pt required Min A with STS from W/C with no cushion d/t low height.   Exercises: Pt completed 15 minutes of nu step bike in order to increase BUE/BLEstrength and endurance in preparation for increased independence in ADLs such as functional mobility. No rest break on level 4 resistance.   Pt dependently transferred to room after stairs for energy conservation.Pt seated in W/C at end of session with W/C alarm donned, call light within reach and 4Ps assessed.    Therapy/Group: Individual Therapy  Velia Meyer, OTD, OTR/L 01/10/2023, 3:32 PM

## 2023-01-10 NOTE — Plan of Care (Signed)
Patient alert/oriented X4. Patient compliant with medication administration and was up in chair the majority of the shift. Patient states this was the best he has felt since his admission. Patient VSS, no complaints at this time,  Problem: Consults Goal: RH GENERAL PATIENT EDUCATION Description: See Patient Education module for education specifics. Outcome: Progressing   Problem: RH BOWEL ELIMINATION Goal: RH STG MANAGE BOWEL WITH ASSISTANCE Description: STG Manage Bowel with mod I Assistance. Outcome: Progressing   Problem: RH BOWEL ELIMINATION Goal: RH STG MANAGE BOWEL W/MEDICATION W/ASSISTANCE Description: STG Manage Bowel with Medication with mod I Assistance. Outcome: Progressing   Problem: RH SKIN INTEGRITY Goal: RH STG SKIN FREE OF INFECTION/BREAKDOWN Description: Manage w min assist Outcome: Progressing   Problem: RH SKIN INTEGRITY Goal: RH STG MAINTAIN SKIN INTEGRITY WITH ASSISTANCE Description: STG Maintain Skin Integrity With min Assistance. Outcome: Progressing   Problem: RH SAFETY Goal: RH STG ADHERE TO SAFETY PRECAUTIONS W/ASSISTANCE/DEVICE Description: STG Adhere to Safety Precautions With cues Assistance/Device. Outcome: Progressing   Problem: RH PAIN MANAGEMENT Goal: RH STG PAIN MANAGED AT OR BELOW PT'S PAIN GOAL Description: < 4 with prns Outcome: Progressing   Problem: RH KNOWLEDGE DEFICIT GENERAL Goal: RH STG INCREASE KNOWLEDGE OF SELF CARE AFTER HOSPITALIZATION Description: Patient will be able to manage care at discharge with medications and dietary modifications using educational resources independently Outcome: Progressing

## 2023-01-10 NOTE — Progress Notes (Signed)
PROGRESS NOTE   Subjective/Complaints: No new concerns this morning.  He is happy with progression of his strength.  He feels optimistic.  Reports his pain is under control.  LBM 12/11  ROS:  Denies fever, chills, shortness of breath, chest pain, abdominal pain, nausea, vomiting + orthostatic hypotension   Pt denies SOB, abd pain, CP, N/V/C/D, and vision changes     Objective:   No results found. Recent Labs    01/10/23 0636  WBC 5.6  HGB 7.9*  HCT 23.9*  PLT 365   Recent Labs    01/08/23 0622 01/10/23 0636  NA 131* 131*  K 4.1 3.8  CL 94* 95*  CO2 28 27  GLUCOSE 100* 93  BUN 7* 7*  CREATININE 0.73 0.81  CALCIUM 8.7* 8.5*    Intake/Output Summary (Last 24 hours) at 01/10/2023 1546 Last data filed at 01/10/2023 1257 Gross per 24 hour  Intake 600 ml  Output 2100 ml  Net -1500 ml        Physical Exam: Vital Signs Blood pressure 108/64, pulse 79, temperature 97.9 F (36.6 C), temperature source Oral, resp. rate 16, height 6\' 3"  (1.905 m), weight 126.3 kg, SpO2 96%.      General: awake, alert, appropriate, sitting in wheelchair, NAD HENT: conjugate gaze; oropharynx moist CV: regular rate and rhythm; no JVD Pulmonary: CTA B/L; no W/R/R- good air movement GI: soft, NT, ND, (+)BS- slightly hypoactive Psychiatric: appropriate Neurological: fait STM- needed a lot of repetition to understand A fib issues Skin- Abdominal incisions with thick black sutures in place- chest incision healing well + Left lower extremity incisions with sutures intact, well-approximated, mild warmth and edema surrounding but no erythema or active drainage. + Right back laceration as below   Neuro: Alert and awake, follows commands, no cognitive deficits noted.  Moving all 4 extremities to gravity and resistance  Prior Exam:  Neuro: AAOx4. No apparent cognitive deficits   DTRs: Reflexes were 2+ in bilateral achilles,  patella, biceps, BR and triceps. Sensory exam: revealed normal sensation in all dermatomal regions in bilateral upper extremities and bilateral lower extremities Motor exam: strength 5/5 throughout bilateral upper extremities, bilateral lower extremities, and with exception of 5- out of 5 in bilateral hip flexors while maintaining upper body range of motion restrictions. Coordination: Fine motor coordination was normal.  Stable 12/8     Assessment/Plan: 1. Functional deficits which require 3+ hours per day of interdisciplinary therapy in a comprehensive inpatient rehab setting. Physiatrist is providing close team supervision and 24 hour management of active medical problems listed below. Physiatrist and rehab team continue to assess barriers to discharge/monitor patient progress toward functional and medical goals  Care Tool:  Bathing    Body parts bathed by patient: Right arm, Left arm, Chest, Abdomen, Front perineal area, Buttocks, Right upper leg, Left upper leg, Face, Right lower leg, Left lower leg         Bathing assist Assist Level: Contact Guard/Touching assist     Upper Body Dressing/Undressing Upper body dressing   What is the patient wearing?: Pull over shirt    Upper body assist Assist Level: Set up assist    Lower Body  Dressing/Undressing Lower body dressing      What is the patient wearing?: Pants     Lower body assist Assist for lower body dressing: Contact Guard/Touching assist     Toileting Toileting    Toileting assist Assist for toileting: Contact Guard/Touching assist     Transfers Chair/bed transfer  Transfers assist     Chair/bed transfer assist level: Contact Guard/Touching assist     Locomotion Ambulation   Ambulation assist      Assist level: Minimal Assistance - Patient > 75% Assistive device: Walker-rolling Max distance: 200   Walk 10 feet activity   Assist     Assist level: Minimal Assistance - Patient >  75% Assistive device: Walker-rolling   Walk 50 feet activity   Assist    Assist level: Minimal Assistance - Patient > 75% Assistive device: Walker-rolling    Walk 150 feet activity   Assist    Assist level: Minimal Assistance - Patient > 75% Assistive device: Walker-rolling    Walk 10 feet on uneven surface  activity   Assist     Assist level: Minimal Assistance - Patient > 75% Assistive device: Development worker, international aid     Assist Is the patient using a wheelchair?: Yes      Wheelchair assist level: Dependent - Patient 0% Max wheelchair distance: 0    Wheelchair 50 feet with 2 turns activity    Assist        Assist Level: Dependent - Patient 0%   Wheelchair 150 feet activity     Assist  Wheelchair 150 feet activity did not occur:  (limited 2/2 endurance deficit)   Assist Level: Dependent - Patient 0%   Blood pressure 108/64, pulse 79, temperature 97.9 F (36.6 C), temperature source Oral, resp. rate 16, height 6\' 3"  (1.905 m), weight 126.3 kg, SpO2 96%.    Medical Problem List and Plan: 1. Functional deficits secondary to cardiac debility status post CABG             -patient may not shower unless abdominal and LLE incisions covered             -ELOS/Goals: 7 to 10 days, modified independent PT/OT   D/c  12/16 Con't CIR PT and OT Educated pt at length about Afib and how to feel having it; how to diagnose it himself and how to address it  my concern is if gets >110 heart rate- d/w pt.   2.  Antithrombotics: -DVT/anticoagulation:  Pharmaceutical: Eliquis             -antiplatelet therapy: Aspirin 81 mg daily             - LLE duplex on admission for increasing edema   3. Pain Management: Tylenol, tramadol as needed   12/9- denied significant pain- just soreness  4. Mood/Behavior/Sleep: LCSW to evaluate and provide emotional support             -trazodone 100 mg q HS prn             -antipsychotic agents: n/a   5.  Neuropsych/cognition: This patient IS capable of making decisions on HIS own behalf.   6. Skin/Wound Care: Routine skin care checks   7. Fluids/Electrolytes/Nutrition: Strict Is and Os and follow-up chemistries   8: Hypertension- with hypotension: monitor TID and prn (see #10 meds below)    12/10- Hypotension- have called Cards to see if can get help on whether or not to add Midodrine, or reduce meds  to help with orthostatic hypotension- occurring when stands, which is limiting therapy  12/11- Reduced Aldactone to 12.5 mg daily and Toprol to 12. 5mg  daily- will wait to  reduce Torsemide but can reduce to 20 mg daily, per Cards.   12/12 BP controlled, monitor response to medication change     01/10/2023    4:51 AM 01/09/2023    7:41 PM 01/09/2023    1:26 PM  Vitals with BMI  Systolic 108 100 295  Diastolic 64 67 81  Pulse 79 78 83     9: Hyperlipidemia: continue statin   10: Acute systolic CHF (cardiogenic shock>>resolved)             -daily weight             -continue digoxin 0.125 mg daily             -continue spironolactone 25 mg daily                         -continue torsemide 40 mg daily             -continue amiodarone 200 mg BID   12/9- torsemide was reduced over the weekend- weight not done this AM-   12/10-- weight not done- was 127.8 yesterday- will ask nursing to weigh daily.   12/11- weight 126/3 kg- doing better  Filed Weights   01/07/23 1000 01/08/23 0718 01/09/23 0500  Weight: 127.8 kg 130.3 kg 126.3 kg    11: CAD:             -s/p CABG 12/02             -follow-up with Dr. Laneta Simmers             -continue asa 325 mg daily   12: Atrial fibrillation:             -on Eliquis 5 mg BID             -continue digoxin 0.125 mg daily             -continue amiodarone 200 mg BID  -Potassium repletion as below   13: morbid  Obesity: BMI = 36.17, provided dietary education  12/9- weight down to 35.07-    14: ABLA: continue Trigels-F Forte daily  -follow-up  CBC   12/9- HB up to 7.5 from 7.3- so doing better- con't to monitor 2x/week  15: Hyponatremia: trending up slowly -follow-up BMP   12/9- Na 130- is down from 132, but was 129 the day prior- will monitor-   12/10- Na 131  12/12 stable at 131 16: Thrombocytopenia: improving, no bleeding; follow-up CBC   1211- Plts up to 272k 17: Hypokalemia: Klor-Con 20 mEq BID   12/9- will replete 40 meq x2 and start KCL 20 meq tomorrow/daily.   12/10- K+ 4.1-   12/12 potassium 3.8, will give additional 20 mEq today due to her history of A-fib for goal greater than 4 18: Nausea: Reglan 10 mg TID   12/11- resolved- will look into reducing reglan- will d/w pt 20: GERD?: Protonix 80 mg  (home on Nexium 40 mg BID)   21: Cough?: on Mucinex 600 mg BID, incentive spirometer ordered   22. Constipation/nausea. LBM 2 days ago. Add sennakot s 1 tab at bedtime. Has PRN sorbitol. Las BM 12/5, added miralax BID  12/9- will give Sorbitol 30 ml x 1 at 1500- and increase Senna  to 1 tab/day.   -  nausea is better- and dizizness Better  12/10- thinks nausea was due to constipation, which has resolved  12/12 LBM yesterday 23. Orthostasis: discussed hyponatremia could contribute, liberalized diet to regular, decrease torsemide to 30mg   12/9- BP a little soft in 100s- no weight today- weight 127.3 kg- will monitor trend, esp with reduction in Torsemide that was done over weekend.   -12/11- Will decrease Aldactone to 12.5 mg dail yand Toprol to 12.5 mg daily- per d/w Cards-  24. Edema: improving, decreased torsemide to 30mg   12/9- LE edema 1-2= - is improving per pt.   12/10- stable- 2-3+ on LLE- but 1-2+ on RLE-  25. Cramps: supplement mag and potassium   26. Magnesium deficiency: supplement IV mag 4 grams  12/9- Mg 1.6 yesterday- s/p IV mg- will recheck in AM  12/10- Mg 2.1  12/12 MG 1.9 stable 27. Insomnia  12/9- increased trazodone to 150 mg at bedtime and added Ambien 5 mg at bedtime prn if needed.   12/10-  changed Ambien to 10 mg at bedtime prn- and con't Trazodone- waking up ~ 1:30 am nightly for past few nights- didn't take sleep meds at home. 12/11- slept "slightly better'- explained don't feel comfortable increasing sleeping meds due to high dose of them - he voiced understanding    LOS: 6 days A FACE TO FACE EVALUATION WAS PERFORMED  Fanny Dance 01/10/2023, 3:46 PM

## 2023-01-11 MED ORDER — MENTHOL 3 MG MT LOZG: 1.0000 | LOZENGE | OROMUCOSAL | Status: DC | PRN

## 2023-01-11 NOTE — Plan of Care (Signed)
Patient alert/oriented X4. Patient compliant with medication administration and was up in chair for majority of shift. ACE wrap bandages applied to both b/l lower extremities with heel elevated in chair. VSS, no complaints at this time.   Problem: Consults Goal: RH GENERAL PATIENT EDUCATION Description: See Patient Education module for education specifics. Outcome: Progressing   Problem: RH BOWEL ELIMINATION Goal: RH STG MANAGE BOWEL WITH ASSISTANCE Description: STG Manage Bowel with mod I Assistance. Outcome: Progressing   Problem: RH BOWEL ELIMINATION Goal: RH STG MANAGE BOWEL W/MEDICATION W/ASSISTANCE Description: STG Manage Bowel with Medication with mod I Assistance. Outcome: Progressing   Problem: RH SKIN INTEGRITY Goal: RH STG SKIN FREE OF INFECTION/BREAKDOWN Description: Manage w min assist Outcome: Progressing   Problem: RH SKIN INTEGRITY Goal: RH STG MAINTAIN SKIN INTEGRITY WITH ASSISTANCE Description: STG Maintain Skin Integrity With min Assistance. Outcome: Progressing   Problem: RH SAFETY Goal: RH STG ADHERE TO SAFETY PRECAUTIONS W/ASSISTANCE/DEVICE Description: STG Adhere to Safety Precautions With cues Assistance/Device. Outcome: Progressing   Problem: RH PAIN MANAGEMENT Goal: RH STG PAIN MANAGED AT OR BELOW PT'S PAIN GOAL Description: < 4 with prns Outcome: Progressing   Problem: RH KNOWLEDGE DEFICIT GENERAL Goal: RH STG INCREASE KNOWLEDGE OF SELF CARE AFTER HOSPITALIZATION Description: Patient will be able to manage care at discharge with medications and dietary modifications using educational resources independently Outcome: Progressing

## 2023-01-11 NOTE — Discharge Instructions (Signed)
Inpatient Rehab Discharge Instructions  Jeremiah Dixon Discharge date and time: 01/14/2023  Activities/Precautions/ Functional Status: Activity: no lifting, driving, or strenuous exercise until cleared by MD. Continue sternal precautions. Diet: regular diet Wound Care: keep wound clean and dry Functional status:  ___ No restrictions     ___ Walk up steps independently ___ 24/7 supervision/assistance   ___ Walk up steps with assistance _x__ Intermittent supervision/assistance  ___ Bathe/dress independently ___ Walk with walker     ___ Bathe/dress with assistance ___ Walk Independently    ___ Shower independently ___ Walk with assistance    ___ Shower with assistance _x__ No alcohol     ___ Return to work/school ________  Special Instructions: No driving, alcohol consumption or tobacco use.  Recommend daily BP measurement in same arm and record time of day. Bring this information with you to follow-up appointment with PCP.  COMMUNITY REFERRALS UPON DISCHARGE:        OUTPATIENT REHAB: PT &  OT             Agency:[facility]  [address]  [address] [place] [address] Phone:[phone]              Appointment Date/Time:WILL CALL TO SET UP FOLLOW UP APPOINTMENTS  Medical Equipment/Items Ordered:  Calton Golds                                                 Agency/Supplier:[facility]  [phone]   My questions have been answered and I understand these instructions. I will adhere to these goals and the provided educational materials after my discharge from the hospital.  Patient/Caregiver Signature _______________________________ Date __________  Clinician Signature _______________________________________ Date __________  Please bring this form and your medication list with you to all your follow-up doctor's appointments.

## 2023-01-11 NOTE — Progress Notes (Signed)
PROGRESS NOTE   Subjective/Complaints:  Pt reports had multiple Bms- LBM yesterday evening.   Denies SOB at rest, but does note, when walking, to gym yesterday and back, had to stop multiple times due to SOB.  Lightheadedness/dizziness much better- very mild now.  Outstanding OT yesterday.  Stamina better and did stairs better.    Per d/w OT, pt doesn't have therapy today- has met therapy for the week.   Doesn't want to reduce Torsemide (which I agree) due to LE edema.   Asking for cough drops- since worked so well when last here.   ROS:     Pt denies SOB but has DOE, abd pain, CP, N/V/C/D, and vision changes  Orthostatic hypotension improved   Objective:   No results found. Recent Labs    01/10/23 0636  WBC 5.6  HGB 7.9*  HCT 23.9*  PLT 365   Recent Labs    01/10/23 0636  NA 131*  K 3.8  CL 95*  CO2 27  GLUCOSE 93  BUN 7*  CREATININE 0.81  CALCIUM 8.5*    Intake/Output Summary (Last 24 hours) at 01/11/2023 1610 Last data filed at 01/11/2023 0726 Gross per 24 hour  Intake 720 ml  Output 2000 ml  Net -1280 ml        Physical Exam: Vital Signs Blood pressure 121/68, pulse 79, temperature 97.9 F (36.6 C), temperature source Oral, resp. rate 18, height 6\' 3"  (1.905 m), weight 125.5 kg, SpO2 96%.       General: awake, alert, appropriate, NAD HENT: conjugate gaze; oropharynx a little dry CV: regular rate and rhythm ; no JVD Pulmonary: CTA B/L; no W/R/R- good air movement GI: soft, NT, ND, (+)BS- more normoactive Psychiatric: appropriate- interactive Extremities: RLE 2+ pitting edema and LLE 3+ due to vein taken from that leg.  Neurological: more alert, less STM issues noted Skin- Abdominal incisions with thick black sutures in place- chest incision healing well + Left lower extremity incisions with sutures intact, well-approximated, mild warmth and edema surrounding but no erythema or  active drainage.    Neuro: Alert and awake, follows commands, no cognitive deficits noted.  Moving all 4 extremities to gravity and resistance  Prior Exam:  Neuro: AAOx4. No apparent cognitive deficits   DTRs: Reflexes were 2+ in bilateral achilles, patella, biceps, BR and triceps. Sensory exam: revealed normal sensation in all dermatomal regions in bilateral upper extremities and bilateral lower extremities Motor exam: strength 5/5 throughout bilateral upper extremities, bilateral lower extremities, and with exception of 5- out of 5 in bilateral hip flexors while maintaining upper body range of motion restrictions. Coordination: Fine motor coordination was normal.  Stable 12/8     Assessment/Plan: 1. Functional deficits which require 3+ hours per day of interdisciplinary therapy in a comprehensive inpatient rehab setting. Physiatrist is providing close team supervision and 24 hour management of active medical problems listed below. Physiatrist and rehab team continue to assess barriers to discharge/monitor patient progress toward functional and medical goals  Care Tool:  Bathing    Body parts bathed by patient: Right arm, Left arm, Chest, Abdomen, Front perineal area, Buttocks, Right upper leg, Left upper leg, Face, Right  lower leg, Left lower leg         Bathing assist Assist Level: Contact Guard/Touching assist     Upper Body Dressing/Undressing Upper body dressing   What is the patient wearing?: Pull over shirt    Upper body assist Assist Level: Set up assist    Lower Body Dressing/Undressing Lower body dressing      What is the patient wearing?: Pants     Lower body assist Assist for lower body dressing: Contact Guard/Touching assist     Toileting Toileting    Toileting assist Assist for toileting: Contact Guard/Touching assist     Transfers Chair/bed transfer  Transfers assist     Chair/bed transfer assist level: Contact Guard/Touching assist      Locomotion Ambulation   Ambulation assist      Assist level: Minimal Assistance - Patient > 75% Assistive device: Walker-rolling Max distance: 200   Walk 10 feet activity   Assist     Assist level: Minimal Assistance - Patient > 75% Assistive device: Walker-rolling   Walk 50 feet activity   Assist    Assist level: Minimal Assistance - Patient > 75% Assistive device: Walker-rolling    Walk 150 feet activity   Assist    Assist level: Minimal Assistance - Patient > 75% Assistive device: Walker-rolling    Walk 10 feet on uneven surface  activity   Assist     Assist level: Minimal Assistance - Patient > 75% Assistive device: Development worker, international aid     Assist Is the patient using a wheelchair?: Yes      Wheelchair assist level: Dependent - Patient 0% Max wheelchair distance: 0    Wheelchair 50 feet with 2 turns activity    Assist        Assist Level: Dependent - Patient 0%   Wheelchair 150 feet activity     Assist  Wheelchair 150 feet activity did not occur:  (limited 2/2 endurance deficit)   Assist Level: Dependent - Patient 0%   Blood pressure 121/68, pulse 79, temperature 97.9 F (36.6 C), temperature source Oral, resp. rate 18, height 6\' 3"  (1.905 m), weight 125.5 kg, SpO2 96%.    Medical Problem List and Plan: 1. Functional deficits secondary to cardiac debility status post CABG             -patient may not shower unless abdominal and LLE incisions covered             -ELOS/Goals: 7 to 10 days, modified independent PT/OT   D/c  12/16 Con't CIR PT, and OT Educated pt at length about Afib and how to feel having it; how to diagnose it himself and how to address it  my concern is if gets >110 heart rate- d/w pt.   2.  Antithrombotics: -DVT/anticoagulation:  Pharmaceutical: Eliquis             -antiplatelet therapy: Aspirin 81 mg daily             - LLE duplex on admission for increasing edema   3. Pain Management:  Tylenol, tramadol as needed   12/9- denied significant pain- just soreness   12/13- not having a lot of pain 4. Mood/Behavior/Sleep: LCSW to evaluate and provide emotional support             -trazodone 100 mg q HS prn             -antipsychotic agents: n/a   5. Neuropsych/cognition: This patient IS capable of  making decisions on HIS own behalf.   6. Skin/Wound Care: Routine skin care checks   7. Fluids/Electrolytes/Nutrition: Strict Is and Os and follow-up chemistries   8: Hypertension- with hypotension: monitor TID and prn (see #10 meds below)    12/10- Hypotension- have called Cards to see if can get help on whether or not to add Midodrine, or reduce meds to help with orthostatic hypotension- occurring when stands, which is limiting therapy  12/11- Reduced Aldactone to 12.5 mg daily and Toprol to 12. 5mg  daily- will wait to  reduce Torsemide but can reduce to 20 mg daily, per Cards.   12/12 BP controlled, monitor response to medication change  12/13- BP controlled- and less hypotension per pt and therapy notes    01/11/2023    5:00 AM 01/11/2023    3:44 AM 01/10/2023    7:26 PM  Vitals with BMI  Weight 276 lbs 11 oz    BMI 34.58    Systolic  121 100  Diastolic  68 68  Pulse  79 81     9: Hyperlipidemia: continue statin   10: Acute systolic CHF (cardiogenic shock>>resolved)             -daily weight             -continue digoxin 0.125 mg daily             -continue spironolactone 25 mg daily                         -continue torsemide 40 mg daily             -continue amiodarone 200 mg BID   12/9- torsemide was reduced over the weekend- weight not done this AM-   12/10-- weight not done- was 127.8 yesterday- will ask nursing to weigh daily.   12/11- weight 126/3 kg- doing better  12/13- Weight 125.5 kg- has been losing weight somewhat- won't decrease Torsemide more due to LE edema- using ACE wraps in therapy Filed Weights   01/08/23 0718 01/09/23 0500 01/11/23 0500   Weight: 130.3 kg 126.3 kg 125.5 kg    11: CAD:             -s/p CABG 12/02             -follow-up with Dr. Laneta Simmers             -continue asa 325 mg daily   12: Atrial fibrillation:             -on Eliquis 5 mg BID             -continue digoxin 0.125 mg daily             -continue amiodarone 200 mg BID  -Potassium repletion as below   13: morbid  Obesity: BMI = 36.17, provided dietary education  12/9- weight down to 35.07-    14: ABLA: continue Trigels-F Forte daily  -follow-up CBC   12/9- HB up to 7.5 from 7.3- so doing better- con't to monitor 2x/week  15: Hyponatremia: trending up slowly -follow-up BMP   12/9- Na 130- is down from 132, but was 129 the day prior- will monitor-   12/10- Na 131  12/12 stable at 131 16: Thrombocytopenia: improving, no bleeding; follow-up CBC   1211- Plts up to 272k 17: Hypokalemia: Klor-Con 20 mEq BID   12/9- will replete 40 meq x2 and start KCL 20 meq tomorrow/daily.  12/10- K+ 4.1-   12/12 potassium 3.8, will give additional 20 mEq today due to her history of A-fib for goal greater than 4 18: Nausea: Reglan 10 mg TID   12/11- resolved- will look into reducing reglan- will d/w pt 20: GERD?: Protonix 80 mg  (home on Nexium 40 mg BID)   21: Cough?: on Mucinex 600 mg BID, incentive spirometer ordered   12/13- Has tessalon pearls, but didn't feel as helpful as could be- asking for cough drops- will order 22. Constipation/nausea. LBM 2 days ago. Add sennakot s 1 tab at bedtime. Has PRN sorbitol. Las BM 12/5, added miralax BID  12/9- will give Sorbitol 30 ml x 1 at 1500- and increase Senna  to 1 tab/day.   -nausea is better- and dizizness Better  12/10- thinks nausea was due to constipation, which has resolved  12/12 LBM yesterday  12/13- LBM yesterday evening 23. Orthostasis: discussed hyponatremia could contribute, liberalized diet to regular, decrease torsemide to 30mg   12/9- BP a little soft in 100s- no weight today- weight 127.3 kg- will  monitor trend, esp with reduction in Torsemide that was done over weekend.   -12/11- Will decrease Aldactone to 12.5 mg dail yand Toprol to 12.5 mg daily- per d/w Cards-   12/13- doing much better- con't regimen 24. Edema: improving, decreased torsemide to 30mg   12/9- LE edema 1-2= - is improving per pt.   12/10- stable- 2-3+ on LLE- but 1-2+ on RLE  12/13- RLE 2+ edema today- will not decrease Torsemide more- and might need to increase? We will monitor 25. Cramps: supplement mag and potassium   26. Magnesium deficiency: supplement IV mag 4 grams  12/9- Mg 1.6 yesterday- s/p IV mg- will recheck in AM  12/10- Mg 2.1  12/12 MG 1.9 stable 27. Insomnia  12/9- increased trazodone to 150 mg at bedtime and added Ambien 5 mg at bedtime prn if needed.   12/10- changed Ambien to 10 mg at bedtime prn- and con't Trazodone- waking up ~ 1:30 am nightly for past few nights- didn't take sleep meds at home. 12/11- slept "slightly better'- explained don't feel comfortable increasing sleeping meds due to high dose of them - he voiced understanding  I spent a total of 39   minutes on total care today- >50% coordination of care- due to  D/w OT about therapies- and why didn't get schedule- also d/w nursing about coughing- and cough drops- and told pt to elevate legs between therapies as much as possible-   LOS: 7 days A FACE TO FACE EVALUATION WAS PERFORMED  Drayden Lukas 01/11/2023, 8:23 AM

## 2023-01-11 NOTE — Progress Notes (Signed)
Patient ID: Jeremiah Dixon, male   DOB: 1960-06-14, 62 y.o.   MRN: [id number] Have ordered tall rolling walker for pt via [employer]. No other equipment needs. Preparing for discharge Monday.

## 2023-01-12 LAB — BPAM RBC
Blood Product Expiration Date: 202412182359
Blood Product Expiration Date: 202412192359
ISSUE DATE / TIME: 202412020811
ISSUE DATE / TIME: 202412020811
Unit Type and Rh: 6200
Unit Type and Rh: 6200

## 2023-01-12 LAB — TYPE AND SCREEN
ABO/RH(D): A POS
Antibody Screen: NEGATIVE
Unit division: 0
Unit division: 0

## 2023-01-12 NOTE — Progress Notes (Signed)
Walked patient in hallway 2x today 1300 and 1600; patient tolerated.

## 2023-01-12 NOTE — Progress Notes (Signed)
PROGRESS NOTE   Subjective/Complaints:   Pt reports LBM yesterday.  Feels good today.  Pain is moniro.   Asking about sutures and when to be removed- and can it be done here.   ROS:    Pt denies SOB less DOE, abd pain, CP, N/V/C/D, and vision changes  Orthostatic hypotension improved   Objective:   No results found. Recent Labs    01/10/23 0636  WBC 5.6  HGB 7.9*  HCT 23.9*  PLT 365   Recent Labs    01/10/23 0636  NA 131*  K 3.8  CL 95*  CO2 27  GLUCOSE 93  BUN 7*  CREATININE 0.81  CALCIUM 8.5*    Intake/Output Summary (Last 24 hours) at 01/12/2023 0955 Last data filed at 01/12/2023 0900 Gross per 24 hour  Intake 834 ml  Output 1800 ml  Net -966 ml        Physical Exam: Vital Signs Blood pressure 104/68, pulse 77, temperature 97.6 F (36.4 C), temperature source Oral, resp. rate 18, height 6\' 3"  (1.905 m), weight 125.4 kg, SpO2 96%.        General: awake, alert, appropriate, sitting up in bedside chair; nursing in room;  NAD HENT: conjugate gaze; oropharynx moist CV: regular rate and rhythm; no JVD Pulmonary: CTA B/L; no W/R/R- good air movement GI: soft, NT, ND, (+)BS Psychiatric: appropriate- interactive Neurological: Ox3- more memory- less memory issues Skin- abdominal black sutures look good; and LLE sutures in place-  RLE 1-2+ LE edema and LLE 2-3+- doing better   Neuro: Alert and awake, follows commands, no cognitive deficits noted.  Moving all 4 extremities to gravity and resistance  Prior Exam:  Neuro: AAOx4. No apparent cognitive deficits   DTRs: Reflexes were 2+ in bilateral achilles, patella, biceps, BR and triceps. Sensory exam: revealed normal sensation in all dermatomal regions in bilateral upper extremities and bilateral lower extremities Motor exam: strength 5/5 throughout bilateral upper extremities, bilateral lower extremities, and with exception of 5- out of  5 in bilateral hip flexors while maintaining upper body range of motion restrictions. Coordination: Fine motor coordination was normal.  Stable 12/8     Assessment/Plan: 1. Functional deficits which require 3+ hours per day of interdisciplinary therapy in a comprehensive inpatient rehab setting. Physiatrist is providing close team supervision and 24 hour management of active medical problems listed below. Physiatrist and rehab team continue to assess barriers to discharge/monitor patient progress toward functional and medical goals  Care Tool:  Bathing    Body parts bathed by patient: Right arm, Left arm, Chest, Abdomen, Front perineal area, Buttocks, Right upper leg, Left upper leg, Face, Right lower leg, Left lower leg         Bathing assist Assist Level: Contact Guard/Touching assist     Upper Body Dressing/Undressing Upper body dressing   What is the patient wearing?: Pull over shirt    Upper body assist Assist Level: Set up assist    Lower Body Dressing/Undressing Lower body dressing      What is the patient wearing?: Pants     Lower body assist Assist for lower body dressing: Contact Guard/Touching assist     Toileting  Toileting    Toileting assist Assist for toileting: Contact Guard/Touching assist     Transfers Chair/bed transfer  Transfers assist     Chair/bed transfer assist level: Contact Guard/Touching assist     Locomotion Ambulation   Ambulation assist      Assist level: Minimal Assistance - Patient > 75% Assistive device: Walker-rolling Max distance: 200   Walk 10 feet activity   Assist     Assist level: Minimal Assistance - Patient > 75% Assistive device: Walker-rolling   Walk 50 feet activity   Assist    Assist level: Minimal Assistance - Patient > 75% Assistive device: Walker-rolling    Walk 150 feet activity   Assist    Assist level: Minimal Assistance - Patient > 75% Assistive device: Walker-rolling     Walk 10 feet on uneven surface  activity   Assist     Assist level: Minimal Assistance - Patient > 75% Assistive device: Development worker, international aid     Assist Is the patient using a wheelchair?: Yes      Wheelchair assist level: Dependent - Patient 0% Max wheelchair distance: 0    Wheelchair 50 feet with 2 turns activity    Assist        Assist Level: Dependent - Patient 0%   Wheelchair 150 feet activity     Assist  Wheelchair 150 feet activity did not occur:  (limited 2/2 endurance deficit)   Assist Level: Dependent - Patient 0%   Blood pressure 104/68, pulse 77, temperature 97.6 F (36.4 C), temperature source Oral, resp. rate 18, height 6\' 3"  (1.905 m), weight 125.4 kg, SpO2 96%.    Medical Problem List and Plan: 1. Functional deficits secondary to cardiac debility status post CABG             -patient may not shower unless abdominal and LLE incisions covered             -ELOS/Goals: 7 to 10 days, modified independent PT/OT   D/c  12/16 Con't CIR PT and OT  Will remove Sutures Monday before goes home on upper abdomen and LLE every other suture- will write order Sunday  Educated pt at length about Afib and how to feel having it; how to diagnose it himself and how to address it  my concern is if gets >110 heart rate- d/w pt.   2.  Antithrombotics: -DVT/anticoagulation:  Pharmaceutical: Eliquis             -antiplatelet therapy: Aspirin 81 mg daily             - LLE duplex on admission for increasing edema   3. Pain Management: Tylenol, tramadol as needed   12/9- denied significant pain- just soreness   12/13- not having a lot of pain 4. Mood/Behavior/Sleep: LCSW to evaluate and provide emotional support             -trazodone 100 mg q HS prn             -antipsychotic agents: n/a   5. Neuropsych/cognition: This patient IS capable of making decisions on HIS own behalf.   6. Skin/Wound Care: Routine skin care checks   7.  Fluids/Electrolytes/Nutrition: Strict Is and Os and follow-up chemistries   8: Hypertension- with hypotension: monitor TID and prn (see #10 meds below)    12/10- Hypotension- have called Cards to see if can get help on whether or not to add Midodrine, or reduce meds to help with orthostatic hypotension- occurring  when stands, which is limiting therapy  12/11- Reduced Aldactone to 12.5 mg daily and Toprol to 12. 5mg  daily- will wait to  reduce Torsemide but can reduce to 20 mg daily, per Cards.   12/12 BP controlled, monitor response to medication change  12/13-12/14 BP controlled- and less hypotension per pt and therapy notes    01/12/2023    5:43 AM 01/12/2023    3:33 AM 01/11/2023    7:49 PM  Vitals with BMI  Weight 276 lbs 7 oz    BMI 34.55    Systolic  104 102  Diastolic  68 73  Pulse  77 78     9: Hyperlipidemia: continue statin   10: Acute systolic CHF (cardiogenic shock>>resolved)             -daily weight             -continue digoxin 0.125 mg daily             -continue spironolactone 25 mg daily                         -continue torsemide 40 mg daily             -continue amiodarone 200 mg BID   12/9- torsemide was reduced over the weekend- weight not done this AM-   12/10-- weight not done- was 127.8 yesterday- will ask nursing to weigh daily.   12/11- weight 126/3 kg- doing better  12/13- Weight 125.5 kg- has been losing weight somewhat- won't decrease Torsemide more due to LE edema- using ACE wraps in therapy  12/14- Weight stable last 2 days- it appears losing edema/fluid to me on exam- con't ACE wraps Filed Weights   01/09/23 0500 01/11/23 0500 01/12/23 0543  Weight: 126.3 kg 125.5 kg 125.4 kg    11: CAD:             -s/p CABG 12/02             -follow-up with Dr. Laneta Simmers             -continue asa 325 mg daily   12: Atrial fibrillation:             -on Eliquis 5 mg BID             -continue digoxin 0.125 mg daily             -continue amiodarone 200 mg  BID  -Potassium repletion as below   13: morbid  Obesity: BMI = 36.17, provided dietary education  12/9- weight down to 35.07-    14: ABLA: continue Trigels-F Forte daily  -follow-up CBC   12/9- HB up to 7.5 from 7.3- so doing better- con't to monitor 2x/week  15: Hyponatremia: trending up slowly -follow-up BMP   12/9- Na 130- is down from 132, but was 129 the day prior- will monitor-   12/10- Na 131  12/12 stable at 131 16: Thrombocytopenia: improving, no bleeding; follow-up CBC   1211- Plts up to 272k 17: Hypokalemia: Klor-Con 20 mEq BID   12/9- will replete 40 meq x2 and start KCL 20 meq tomorrow/daily.   12/10- K+ 4.1-   12/12 potassium 3.8, will give additional 20 mEq today due to his history of A-fib for goal greater than 4  12/14- labs Monday  18: Nausea: Reglan 10 mg TID   12/11- resolved- will look into reducing reglan- will d/w pt 20: GERD?: Protonix 80  mg  (home on Nexium 40 mg BID)   21: Cough?: on Mucinex 600 mg BID, incentive spirometer ordered   12/13- Has tessalon pearls, but didn't feel as helpful as could be- asking for cough drops- will order 22. Constipation/nausea. LBM 2 days ago. Add sennakot s 1 tab at bedtime. Has PRN sorbitol. Las BM 12/5, added miralax BID  12/9- will give Sorbitol 30 ml x 1 at 1500- and increase Senna  to 1 tab/day.   -nausea is better- and dizizness Better  12/14- LBM yesterday 23. Orthostasis: discussed hyponatremia could contribute, liberalized diet to regular, decrease torsemide to 30mg   12/9- BP a little soft in 100s- no weight today- weight 127.3 kg- will monitor trend, esp with reduction in Torsemide that was done over weekend.   -12/11- Will decrease Aldactone to 12.5 mg dail yand Toprol to 12.5 mg daily- per d/w Cards-   12/13- doing much better- con't regimen  12/14- continues to have better OH-  24. Edema: improving, decreased torsemide to 30mg   12/9- LE edema 1-2= - is improving per pt.   12/10- stable- 2-3+ on LLE- but  1-2+ on RLE  12/13- RLE 2+ edema today- will not decrease Torsemide more- and might need to increase? We will monitor  12/14- RLE is better today 1-2+, so better -will con't to wrap with ACE wraps during day.  25. Cramps: supplement mag and potassium   26. Magnesium deficiency: supplement IV mag 4 grams  12/9- Mg 1.6 yesterday- s/p IV mg- will recheck in AM  12/10- Mg 2.1  12/12 MG 1.9 stable 27. Insomnia  12/9- increased trazodone to 150 mg at bedtime and added Ambien 5 mg at bedtime prn if needed.   12/10- changed Ambien to 10 mg at bedtime prn- and con't Trazodone- waking up ~ 1:30 am nightly for past few nights- didn't take sleep meds at home. 12/11- slept "slightly better'- explained don't feel comfortable increasing sleeping meds due to high dose of them - he voiced understanding  12/14- sleeping a little better  I spent a total of 37   minutes on total care today- >50% coordination of care- due to  D/w pt about therapies and LE edema-  and sutures- also d/w nursing x2.   LOS: 8 days A FACE TO FACE EVALUATION WAS PERFORMED  Krisy Dix 01/12/2023, 9:55 AM

## 2023-01-13 NOTE — Progress Notes (Signed)
Occupational Therapy Discharge Summary  Patient Details  Name: Jeremiah Dixon MRN: [id number] Date of Birth: 01-26-1961  Date of Discharge from OT service:January 13, 2023  Today's Date: 01/13/2023 OT Individual Time: 0800-0905 OT Individual Time Calculation (min): 65 min    Patient has met 10 of 10 long term goals due to improved activity tolerance, improved balance, postural control, ability to compensate for deficits, and improved awareness.  Patient to discharge at overall Modified Independent level.  Patient's care partner is independent to provide the necessary  IADL  assistance at discharge and provide transportation to doctors appointments/therapy.    Reasons goals not met: All goals met  Recommendation:  Patient will benefit from ongoing skilled OT services in outpatient setting to continue to advance functional skills in the area of BADL and Reduce care partner burden.  Equipment: No equipment provided  Reasons for discharge: treatment goals met and discharge from hospital  Patient/family agrees with progress made and goals achieved: Yes  OT Discharge Precautions/Restrictions  Precautions Precautions: Fall;Sternal Restrictions Weight Bearing Restrictions Per Provider Order: No Pain Pain Assessment Pain Scale: 0-10 Pain Score: 0-No pain ADL ADL Eating: Independent Where Assessed-Eating: Bed level Grooming: Modified independent Where Assessed-Grooming: Chair Upper Body Bathing: Modified independent Where Assessed-Upper Body Bathing: Shower (standing) Lower Body Bathing: Modified independent (standing) Where Assessed-Lower Body Bathing: Shower Upper Body Dressing: Modified independent (Device) Where Assessed-Upper Body Dressing: Edge of bed Lower Body Dressing: Modified independent Where Assessed-Lower Body Dressing: Chair Toileting: Modified independent Where Assessed-Toileting: Teacher, adult education: Engineer, agricultural Method:  Proofreader: Gaffer: Modified independent Web designer Method: Ship broker: Acupuncturist: Modified independent Film/video editor Method: Designer, industrial/product: Grab bars ADL Comments: Ptable to complete bathing in standing with no safety concerns and following sternal precautions with no LOB/SOB, pt then able to stand to complete grooming immediately following shower. Pt able to gather all clother prior to shower, reaching out of BOS within sternal precautions to retrieve clother from drawers then clean up post shower, reaching to floor Mod I to gather towels and put them in dirty linen bag. Vision Baseline Vision/History: 1 Wears glasses Patient Visual Report: No change from baseline Vision Assessment?: No apparent visual deficits Perception  Perception: Within Functional Limits Praxis Praxis: WFL Cognition Cognition Overall Cognitive Status: Within Functional Limits for tasks assessed Arousal/Alertness: Awake/alert Orientation Level: Person;Place;Situation Person: Oriented Place: Oriented Situation: Oriented Memory: Appears intact Focused Attention: Appears intact Sustained Attention: Appears intact Awareness: Appears intact Problem Solving: Appears intact Reasoning: Appears intact Safety/Judgment: Appears intact Brief Interview for Mental Status (BIMS) Repetition of Three Words (First Attempt): 3 Temporal Orientation: Year: Correct Temporal Orientation: Month: Accurate within 5 days Temporal Orientation: Day: Correct Recall: "Sock": Yes, no cue required Recall: "Blue": Yes, no cue required Recall: "Bed": Yes, no cue required BIMS Summary Score: 15 Sensation Sensation Light Touch: Appears Intact Hot/Cold: Appears Intact Proprioception: Appears Intact Stereognosis: Not tested Coordination Gross Motor Movements are Fluid and Coordinated: Yes Fine  Motor Movements are Fluid and Coordinated: Yes Motor  Motor Motor: Within Functional Limits Motor - Discharge Observations: [facility] Mobility  Bed Mobility Bed Mobility: Rolling Right;Rolling Left;Supine to Sit;Sit to Supine Rolling Right: Independent Rolling Left: Independent Supine to Sit: Independent with assistive device Sit to Supine: Independent with assistive device Transfers Sit to Stand: Independent with assistive device Stand to Sit: Independent with assistive device  Trunk/Postural Assessment  Cervical Assessment Cervical Assessment: Within Functional Limits Thoracic  Assessment Thoracic Assessment: Within Functional Limits Lumbar Assessment Lumbar Assessment: Within Functional Limits Postural Control Postural Control: Within Functional Limits  Balance Balance Balance Assessed: Yes Static Sitting Balance Static Sitting - Balance Support: Feet unsupported Static Sitting - Level of Assistance: 7: Independent Dynamic Sitting Balance Dynamic Sitting - Balance Support: Feet unsupported;During functional activity Dynamic Sitting - Level of Assistance: 7: Independent Dynamic Sitting - Balance Activities: Reaching for objects;Forward lean/weight shifting Static Standing Balance Static Standing - Balance Support: No upper extremity supported;During functional activity Static Standing - Level of Assistance: 6: Modified independent (Device/Increase time) Dynamic Standing Balance Dynamic Standing - Balance Support: No upper extremity supported;During functional activity Dynamic Standing - Level of Assistance: 6: Modified independent (Device/Increase time) Dynamic Standing - Balance Activities: Reaching for objects Extremity/Trunk Assessment RUE Assessment RUE Assessment: Within Functional Limits Active Range of Motion (AROM) Comments: WFL General Strength Comments: WFL within sternal precautions LUE Assessment LUE Assessment: Within Functional Limits Active Range of Motion  (AROM) Comments: WFL General Strength Comments: WFL within sternal precautions   General: " Thank you for everything." Pt seated in recliner upon OT arrival, agreeable to OT.  Pain: see above  ADL: Pt able to complete full ADL in standing with no LOB/SOB, able to doff/don pants in standing safely with no LOB. See levels of assistance above for ADLs. Pt demonstrating increased endurance and ability to complete sit to stand from low surface at Mod I this date with ADLs.  Other Treatments: Pt educated on ACE wrapping for edema management for completing at home.   Pt seated in recliner at end of session with W/C alarm donned, call light within reach and 4Ps assessed.    Velia Meyer, OTD, OTR/L 01/13/2023, 4:58 PM

## 2023-01-13 NOTE — Progress Notes (Signed)
PROGRESS NOTE   Subjective/Complaints:  Pt reports no SOB at rest- just with walking further distances Walked to 2nd entrance of nursing station yesterday x2 and back to room (2 separate times x1)-  Also did theraband UB exercises and some squats.   Reports LBM 2 days ago- took Sorbitol already this AM.  Waiting for results.   No pain- feeling good.  ROS:    Pt denies SOB, abd pain, CP, N/V/C/D, and vision changes  Orthostatic hypotension improved- just a few seconds   Objective:   No results found. No results for input(s): "WBC", "HGB", "HCT", "PLT" in the last 72 hours.  No results for input(s): "NA", "K", "CL", "CO2", "GLUCOSE", "BUN", "CREATININE", "CALCIUM" in the last 72 hours.   Intake/Output Summary (Last 24 hours) at 01/13/2023 0847 Last data filed at 01/13/2023 0500 Gross per 24 hour  Intake 480 ml  Output 1900 ml  Net -1420 ml        Physical Exam: Vital Signs Blood pressure 103/69, pulse 76, temperature (!) 97.5 F (36.4 C), temperature source Oral, resp. rate 18, height 6\' 3"  (1.905 m), weight 125.3 kg, SpO2 97%.         General: awake, alert, appropriate, sitting up in bedside chair; OT at bedside;  NAD HENT: conjugate gaze; oropharynx moist CV: regular rate and rhythm; no JVD Pulmonary: CTA B/L; no W/R/R- good air movement GI: soft, NT, ND, (+)BS- a little hypoactive Psychiatric: appropriate Neurological: Ox3 Skin: Sutures look great on LLE and abdomen- no drainage or erythema Extremities: RLE 1-2+ and LLE 2-3+ but stable  Neuro: Alert and awake, follows commands, no cognitive deficits noted.  Moving all 4 extremities to gravity and resistance  Prior Exam:  Neuro: AAOx4. No apparent cognitive deficits   DTRs: Reflexes were 2+ in bilateral achilles, patella, biceps, BR and triceps. Sensory exam: revealed normal sensation in all dermatomal regions in bilateral upper extremities  and bilateral lower extremities Motor exam: strength 5/5 throughout bilateral upper extremities, bilateral lower extremities, and with exception of 5- out of 5 in bilateral hip flexors while maintaining upper body range of motion restrictions. Coordination: Fine motor coordination was normal.  Stable 12/8     Assessment/Plan: 1. Functional deficits which require 3+ hours per day of interdisciplinary therapy in a comprehensive inpatient rehab setting. Physiatrist is providing close team supervision and 24 hour management of active medical problems listed below. Physiatrist and rehab team continue to assess barriers to discharge/monitor patient progress toward functional and medical goals  Care Tool:  Bathing    Body parts bathed by patient: Right arm, Left arm, Chest, Abdomen, Front perineal area, Buttocks, Right upper leg, Left upper leg, Face, Right lower leg, Left lower leg         Bathing assist Assist Level: Contact Guard/Touching assist     Upper Body Dressing/Undressing Upper body dressing   What is the patient wearing?: Pull over shirt    Upper body assist Assist Level: Set up assist    Lower Body Dressing/Undressing Lower body dressing      What is the patient wearing?: Pants     Lower body assist Assist for lower body dressing: Contact Guard/Touching  assist     Toileting Toileting    Toileting assist Assist for toileting: Contact Guard/Touching assist     Transfers Chair/bed transfer  Transfers assist     Chair/bed transfer assist level: Contact Guard/Touching assist     Locomotion Ambulation   Ambulation assist      Assist level: Minimal Assistance - Patient > 75% Assistive device: Walker-rolling Max distance: 200   Walk 10 feet activity   Assist     Assist level: Minimal Assistance - Patient > 75% Assistive device: Walker-rolling   Walk 50 feet activity   Assist    Assist level: Minimal Assistance - Patient > 75% Assistive  device: Walker-rolling    Walk 150 feet activity   Assist    Assist level: Minimal Assistance - Patient > 75% Assistive device: Walker-rolling    Walk 10 feet on uneven surface  activity   Assist     Assist level: Minimal Assistance - Patient > 75% Assistive device: Development worker, international aid     Assist Is the patient using a wheelchair?: Yes      Wheelchair assist level: Dependent - Patient 0% Max wheelchair distance: 0    Wheelchair 50 feet with 2 turns activity    Assist        Assist Level: Dependent - Patient 0%   Wheelchair 150 feet activity     Assist  Wheelchair 150 feet activity did not occur:  (limited 2/2 endurance deficit)   Assist Level: Dependent - Patient 0%   Blood pressure 103/69, pulse 76, temperature (!) 97.5 F (36.4 C), temperature source Oral, resp. rate 18, height 6\' 3"  (1.905 m), weight 125.3 kg, SpO2 97%.    Medical Problem List and Plan: 1. Functional deficits secondary to cardiac debility status post CABG             -patient may not shower unless abdominal and LLE incisions covered             -ELOS/Goals: 7 to 10 days, modified independent PT/OT   D/c  12/16 Con't CIR PT and OT Will remove Sutures Monday before goes home on upper abdomen and LLE every other suture- will write order Sunday- ordered today for tomorrow  Educated pt at length about Afib and how to feel having it; how to diagnose it himself and how to address it  my concern is if gets >110 heart rate- d/w pt.   2.  Antithrombotics: -DVT/anticoagulation:  Pharmaceutical: Eliquis             -antiplatelet therapy: Aspirin 81 mg daily             - LLE duplex on admission for increasing edema   3. Pain Management: Tylenol, tramadol as needed   12/9- denied significant pain- just soreness   12/13- not having a lot of pain 4. Mood/Behavior/Sleep: LCSW to evaluate and provide emotional support             -trazodone 100 mg q HS prn              -antipsychotic agents: n/a   5. Neuropsych/cognition: This patient IS capable of making decisions on HIS own behalf.   6. Skin/Wound Care: Routine skin care checks   7. Fluids/Electrolytes/Nutrition: Strict Is and Os and follow-up chemistries   8: Hypertension- with hypotension: monitor TID and prn (see #10 meds below)    12/10- Hypotension- have called Cards to see if can get help on whether or not to add  Midodrine, or reduce meds to help with orthostatic hypotension- occurring when stands, which is limiting therapy  12/11- Reduced Aldactone to 12.5 mg daily and Toprol to 12. 5mg  daily- will wait to  reduce Torsemide but can reduce to 20 mg daily, per Cards.   12/12 BP controlled, monitor response to medication change  12/13-12/14 BP controlled- and less hypotension per pt and therapy notes  12/15- BP a little low this Am 99 systolic, however says light headedness just a  few seconds when stands up -no actual presyncope Sx's.con't regimen for now- might need Cards to stop Aldactone in f/u.    01/13/2023    5:32 AM 01/13/2023    4:38 AM 01/12/2023    7:42 PM  Vitals with BMI  Weight 276 lbs 4 oz    BMI 34.53    Systolic  103 99  Diastolic  69 75  Pulse  76 79     9: Hyperlipidemia: continue statin   10: Acute systolic CHF (cardiogenic shock>>resolved)             -daily weight             -continue digoxin 0.125 mg daily             -continue spironolactone 25 mg daily                         -continue torsemide 40 mg daily             -continue amiodarone 200 mg BID   12/9- torsemide was reduced over the weekend- weight not done this AM-   12/10-- weight not done- was 127.8 yesterday- will ask nursing to weigh daily.   12/11- weight 126/3 kg- doing better  12/13- Weight 125.5 kg- has been losing weight somewhat- won't decrease Torsemide more due to LE edema- using ACE wraps in therapy  12/14- Weight stable last 2 days- it appears losing edema/fluid to me on exam- con't ACE  wraps  12/15- Weight 125.3 kg- stable- con't regimen Filed Weights   01/11/23 0500 01/12/23 0543 01/13/23 0532  Weight: 125.5 kg 125.4 kg 125.3 kg    11: CAD:             -s/p CABG 12/02             -follow-up with Dr. Laneta Simmers             -continue asa 325 mg daily   12: Atrial fibrillation:             -on Eliquis 5 mg BID             -continue digoxin 0.125 mg daily             -continue amiodarone 200 mg BID  -Potassium repletion as below   12/15- Dig level was 0.8- stable 13: morbid  Obesity: BMI = 36.17, provided dietary education  12/9- weight down to 35.07-    14: ABLA: continue Trigels-F Forte daily  -follow-up CBC   12/9- HB up to 7.5 from 7.3- so doing better- con't to monitor 2x/week  15: Hyponatremia: trending up slowly -follow-up BMP   12/9- Na 130- is down from 132, but was 129 the day prior- will monitor-   12/10- Na 131  12/12 stable at 131 16: Thrombocytopenia: improving, no bleeding; follow-up CBC   1211- Plts up to 272k 17: Hypokalemia: Klor-Con 20 mEq BID   12/9- will replete 40 meq  x2 and start KCL 20 meq tomorrow/daily.   12/10- K+ 4.1-   12/12 potassium 3.8, will give additional 20 mEq today due to his history of A-fib for goal greater than 4  12/14- labs Monday -   12/15- will also check Mg level just in case 18: Nausea: Reglan 10 mg TID   12/11- resolved- will look into reducing reglan- will d/w pt 20: GERD?: Protonix 80 mg  (home on Nexium 40 mg BID)   21: Cough?: on Mucinex 600 mg BID, incentive spirometer ordered   12/13- Has tessalon pearls, but didn't feel as helpful as could be- asking for cough drops- will order 22. Constipation/nausea. LBM 2 days ago. Add sennakot s 1 tab at bedtime. Has PRN sorbitol. Las BM 12/5, added miralax BID  12/9- will give Sorbitol 30 ml x 1 at 1500- and increase Senna  to 1 tab/day.   -nausea is better- and dizizness Better  12/14- LBM yesterday  12/15- took Sorbitol this Am since LBM 2 days ago-  23.  Orthostasis: discussed hyponatremia could contribute, liberalized diet to regular, decrease torsemide to 30mg   12/9- BP a little soft in 100s- no weight today- weight 127.3 kg- will monitor trend, esp with reduction in Torsemide that was done over weekend.   -12/11- Will decrease Aldactone to 12.5 mg dail yand Toprol to 12.5 mg daily- per d/w Cards-   12/13- doing much better- con't regimen  12/14- continues to have better OH-  24. Edema: improving, decreased torsemide to 30mg   12/9- LE edema 1-2= - is improving per pt.   12/10- stable- 2-3+ on LLE- but 1-2+ on RLE  12/13- RLE 2+ edema today- will not decrease Torsemide more- and might need to increase? We will monitor  12/14- RLE is better today 1-2+, so better -will con't to wrap with ACE wraps during day.   12/15- asked OT to teach pt how to wrap legs with ACE wraps so can reduce swelling here and at home- had to cut off a TED at home in past.  25. Cramps: supplement mag and potassium   26. Magnesium deficiency: supplement IV mag 4 grams  12/9- Mg 1.6 yesterday- s/p IV mg- will recheck in AM  12/10- Mg 2.1  12/12 MG 1.9 stable 27. Insomnia  12/9- increased trazodone to 150 mg at bedtime and added Ambien 5 mg at bedtime prn if needed.   12/10- changed Ambien to 10 mg at bedtime prn- and con't Trazodone- waking up ~ 1:30 am nightly for past few nights- didn't take sleep meds at home. 12/11- slept "slightly better'- explained don't feel comfortable increasing sleeping meds due to high dose of them - he voiced understanding  12/14- sleeping a little better  I spent a total of 35   minutes on total care today- >50% coordination of care- due to  D/w OT about ACE wraps; and review of labs; vitals and notes since yesterday- will get labs in AM   LOS: 9 days A FACE TO FACE EVALUATION WAS PERFORMED  Tarica Harl 01/13/2023, 8:47 AM

## 2023-01-13 NOTE — Progress Notes (Signed)
Physical Therapy Discharge Summary  Patient Details  Name: Jeremiah Dixon MRN: [id number] Date of Birth: 1960-12-04  Date of Discharge from PT service:January 13, 2023  Today's Date: 01/13/2023 PT Individual Time: 1000-1045 PT Individual Time Calculation (min): 45 min    Patient has met 6 of 6 long term goals due to improved activity tolerance, improved balance, improved postural control, increased strength, increased range of motion, ability to compensate for deficits, and improved coordination.  Patient to discharge at an ambulatory level Modified Independent.     Reasons goals not met: N/A  Recommendation:  Patient will benefit from ongoing skilled PT services in outpatient setting to continue to advance safe functional mobility, address ongoing impairments in activity tolerance, and minimize fall risk.  Equipment: Agricultural consultant   Reasons for discharge: treatment goals met and discharge from hospital  Patient/family agrees with progress made and goals achieved: Yes  PT Discharge Pain Interference Pain Interference Pain Effect on Sleep: 1. Rarely or not at all Pain Interference with Therapy Activities: 1. Rarely or not at all Pain Interference with Day-to-Day Activities: 1. Rarely or not at all Cognition Overall Cognitive Status: Within Functional Limits for tasks assessed Arousal/Alertness: Awake/alert Orientation Level: Oriented X4 Focused Attention: Appears intact Sustained Attention: Appears intact Memory: Appears intact Awareness: Appears intact Problem Solving: Appears intact Reasoning: Appears intact Safety/Judgment: Appears intact Sensation Sensation Light Touch: Appears Intact Hot/Cold: Appears Intact Proprioception: Appears Intact Stereognosis: Not tested Coordination Gross Motor Movements are Fluid and Coordinated: Yes Fine Motor Movements are Fluid and Coordinated: Yes Coordination and Movement Description: [facility] Finger Nose Finger Test:  [facility] Heel Shin Test: [facility] Motor  Motor Motor: Within Functional Limits Motor - Skilled Clinical Observations: WFL Motor - Discharge Observations: WFL  Mobility Bed Mobility Bed Mobility: Rolling Right;Rolling Left;Supine to Sit;Sit to Supine Rolling Right: Independent Rolling Left: Independent Supine to Sit: Independent Sit to Supine: Independent Transfers Transfers: Sit to Stand;Stand to Sit Sit to Stand: Independent with assistive device Stand to Sit: Independent with assistive device Stand Pivot Transfers: Independent with assistive device Transfer (Assistive device): Rolling walker Locomotion  Gait Ambulation: Yes Gait Assistance: Independent with assistive device Gait Distance (Feet): 300 Feet Assistive device: Rolling walker Gait Gait: Yes Gait Pattern: Within Functional Limits Gait Pattern: Within Functional Limits Gait velocity: WFL Stairs / Additional Locomotion Stairs: Yes Stairs Assistance: Independent with assistive device Stair Management Technique: One rail Right Number of Stairs: 14 Height of Stairs: 7.5 Ramp: Independent with assistive device Curb: Independent with assistive device Pick up small object from the floor assist level: Independent with assistive device Wheelchair Mobility Wheelchair Mobility: No  Trunk/Postural Assessment  Cervical Assessment Cervical Assessment: Within Functional Limits Thoracic Assessment Thoracic Assessment: Within Functional Limits Lumbar Assessment Lumbar Assessment: Within Functional Limits Postural Control Postural Control: Within Functional Limits  Balance Balance Balance Assessed: Yes Static Sitting Balance Static Sitting - Balance Support: Feet unsupported Static Sitting - Level of Assistance: 7: Independent Dynamic Sitting Balance Dynamic Sitting - Balance Support: Feet unsupported;During functional activity Dynamic Sitting - Level of Assistance: 7: Independent Dynamic Sitting - Balance Activities:  Reaching for objects;Forward lean/weight shifting Static Standing Balance Static Standing - Balance Support: No upper extremity supported;During functional activity Static Standing - Level of Assistance: 6: Modified independent (Device/Increase time) Dynamic Standing Balance Dynamic Standing - Balance Support: No upper extremity supported;During functional activity Dynamic Standing - Level of Assistance: 6: Modified independent (Device/Increase time) Dynamic Standing - Balance Activities: Reaching for objects Extremity Assessment  RUE Assessment RUE Assessment:  Within Functional Limits Active Range of Motion (AROM) Comments: WFL General Strength Comments: WFL within sternal precautions LUE Assessment LUE Assessment: Within Functional Limits Active Range of Motion (AROM) Comments: WFL General Strength Comments: WFL within sternal precautions RLE Assessment RLE Assessment: Within Functional Limits RLE Strength RLE Overall Strength: Within Functional Limits for tasks assessed LLE Assessment LLE Assessment: Within Functional Limits LLE Strength LLE Overall Strength: Within Functional Limits for tasks assessed   PT session with emphasis on discharge planning and stair training. Pt declines pain and mod I with all mobility in session including transfers from low surfaces, ambulation with and without RW and steps x 12 (7.5 inches) to simulate entry into home environment. Pt educated on HEP, walking program and energy conservation. Pt returned to room via ambulation and cleared Mod I in room, communicated with nursing.    [name] [name] PT, DPT  01/13/2023, 12:46 PM

## 2023-01-13 NOTE — Plan of Care (Signed)
  Problem: RH Balance Goal: LTG Patient will maintain dynamic standing with ADLs (OT) Description: LTG:  Patient will maintain dynamic standing balance with assist during activities of daily living (OT)  Outcome: Completed/Met Flowsheets (Taken 01/05/2023 1248 by Isabella Stalling, OT) LTG: Pt will maintain dynamic standing balance during ADLs with: Independent with assistive device   Problem: Sit to Stand Goal: LTG:  Patient will perform sit to stand in prep for activites of daily living with assistance level (OT) Description: LTG:  Patient will perform sit to stand in prep for activites of daily living with assistance level (OT) Outcome: Completed/Met Flowsheets (Taken 01/05/2023 1248 by Isabella Stalling, OT) LTG: PT will perform sit to stand in prep for activites of daily living with assistance level: Independent with assistive device   Problem: RH Bathing Goal: LTG Patient will bathe all body parts with assist levels (OT) Description: LTG: Patient will bathe all body parts with assist levels (OT) Outcome: Completed/Met Flowsheets (Taken 01/05/2023 1248 by Isabella Stalling, OT) LTG: Pt will perform bathing with assistance level/cueing: Independent with assistive device    Problem: RH Dressing Goal: LTG Patient will perform upper body dressing (OT) Description: LTG Patient will perform upper body dressing with assist, with/without cues (OT). Outcome: Completed/Met Flowsheets (Taken 01/05/2023 1248 by Isabella Stalling, OT) LTG: Pt will perform upper body dressing with assistance level of: Independent with assistive device Goal: LTG Patient will perform lower body dressing w/assist (OT) Description: LTG: Patient will perform lower body dressing with assist, with/without cues in positioning using equipment (OT) Outcome: Completed/Met Flowsheets (Taken 01/05/2023 1248 by Isabella Stalling, OT) LTG: Pt will perform lower body dressing with assistance level of:  Independent with assistive device   Problem: RH Toileting Goal: LTG Patient will perform toileting task (3/3 steps) with assistance level (OT) Description: LTG: Patient will perform toileting task (3/3 steps) with assistance level (OT)  Outcome: Completed/Met Flowsheets (Taken 01/05/2023 1248 by Isabella Stalling, OT) LTG: Pt will perform toileting task (3/3 steps) with assistance level: Independent with assistive device   Problem: RH Simple Meal Prep Goal: LTG Patient will perform simple meal prep w/assist (OT) Description: LTG: Patient will perform simple meal prep with assistance, with/without cues (OT). Outcome: Completed/Met Flowsheets (Taken 01/05/2023 1248 by Isabella Stalling, OT) LTG: Pt will perform simple meal prep with assistance level of: Independent with assistive device   Problem: RH Light Housekeeping Goal: LTG Patient will perform light housekeeping w/assist (OT) Description: LTG: Patient will perform light housekeeping with assistance, with/without cues (OT). Outcome: Completed/Met Flowsheets (Taken 01/05/2023 1248 by Isabella Stalling, OT) LTG: Pt will perform light housekeeping with assistance level of: Independent with assistive device   Problem: RH Toilet Transfers Goal: LTG Patient will perform toilet transfers w/assist (OT) Description: LTG: Patient will perform toilet transfers with assist, with/without cues using equipment (OT) Outcome: Completed/Met Flowsheets (Taken 01/05/2023 1248 by Isabella Stalling, OT) LTG: Pt will perform toilet transfers with assistance level of: Independent with assistive device   Problem: RH Tub/Shower Transfers Goal: LTG Patient will perform tub/shower transfers w/assist (OT) Description: LTG: Patient will perform tub/shower transfers with assist, with/without cues using equipment (OT) Outcome: Completed/Met Flowsheets (Taken 01/05/2023 1248 by Isabella Stalling, OT) LTG: Pt will perform tub/shower stall  transfers with assistance level of: Independent with assistive device

## 2023-01-14 ENCOUNTER — Other Ambulatory Visit (HOSPITAL_COMMUNITY): Payer: Self-pay

## 2023-01-14 DIAGNOSIS — E871 Hypo-osmolality and hyponatremia: Secondary | ICD-10-CM

## 2023-01-14 LAB — BASIC METABOLIC PANEL
Anion gap: 9 (ref 5–15)
BUN: 6 mg/dL — ABNORMAL LOW (ref 8–23)
CO2: 26 mmol/L (ref 22–32)
Calcium: 8.7 mg/dL — ABNORMAL LOW (ref 8.9–10.3)
Chloride: 97 mmol/L — ABNORMAL LOW (ref 98–111)
Creatinine, Ser: 0.87 mg/dL (ref 0.61–1.24)
GFR, Estimated: >60 mL/min (ref >60)
Glucose, Bld: 102 mg/dL — ABNORMAL HIGH (ref 70–99)
Potassium: 3.3 mmol/L — ABNORMAL LOW (ref 3.5–5.1)
Sodium: 132 mmol/L — ABNORMAL LOW (ref 135–145)

## 2023-01-14 LAB — CBC WITH DIFFERENTIAL/PLATELET
Abs Immature Granulocytes: 0.02 10*3/uL (ref 0.00–0.07)
Basophils Absolute: 0 10*3/uL (ref 0.0–0.1)
Basophils Relative: 1 %
Eosinophils Absolute: 0.3 10*3/uL (ref 0.0–0.5)
Eosinophils Relative: 4 %
HCT: 27.8 % — ABNORMAL LOW (ref 39.0–52.0)
Hemoglobin: 8.8 g/dL — ABNORMAL LOW (ref 13.0–17.0)
Immature Granulocytes: 0 %
Lymphocytes Relative: 21 %
Lymphs Abs: 1.3 10*3/uL (ref 0.7–4.0)
MCH: 29.9 pg (ref 26.0–34.0)
MCHC: 31.7 g/dL (ref 30.0–36.0)
MCV: 94.6 fL (ref 80.0–100.0)
Monocytes Absolute: 0.8 10*3/uL (ref 0.1–1.0)
Monocytes Relative: 13 %
Neutro Abs: 3.7 10*3/uL (ref 1.7–7.7)
Neutrophils Relative %: 61 %
Platelets: 402 10*3/uL — ABNORMAL HIGH (ref 150–400)
RBC: 2.94 MIL/uL — ABNORMAL LOW (ref 4.22–5.81)
RDW: 13.9 % (ref 11.5–15.5)
WBC: 6.1 10*3/uL (ref 4.0–10.5)
nRBC: 0 % (ref 0.0–0.2)

## 2023-01-14 LAB — MAGNESIUM: Magnesium: 2.2 mg/dL (ref 1.7–2.4)

## 2023-01-14 MED ORDER — APIXABAN 5 MG PO TABS
5.0000 mg | ORAL_TABLET | Freq: Two times a day (BID) | ORAL | 0 refills | Status: DC
  Filled 2023-01-14: qty 60, 30d supply, fill #0

## 2023-01-14 MED ORDER — POTASSIUM CHLORIDE CRYS ER 20 MEQ PO TBCR
20.0000 meq | EXTENDED_RELEASE_TABLET | Freq: Every day | ORAL | 0 refills | Status: DC
  Filled 2023-01-14: qty 30, 30d supply, fill #0

## 2023-01-14 MED ORDER — TORSEMIDE 10 MG PO TABS
30.0000 mg | ORAL_TABLET | Freq: Every day | ORAL | 0 refills | Status: DC
  Filled 2023-01-14: qty 90, 30d supply, fill #0

## 2023-01-14 MED ORDER — POTASSIUM CHLORIDE CRYS ER 20 MEQ PO TBCR: 30.0000 meq | EXTENDED_RELEASE_TABLET | Freq: Once | ORAL | Status: DC

## 2023-01-14 MED ORDER — FE FUM-VIT C-VIT B12-FA 460-60-0.01-1 MG PO CAPS
1.0000 | ORAL_CAPSULE | Freq: Every day | ORAL | 0 refills | Status: AC
  Filled 2023-01-14: qty 30, 30d supply, fill #0

## 2023-01-14 MED ORDER — METOPROLOL SUCCINATE ER 25 MG PO TB24
12.5000 mg | ORAL_TABLET | Freq: Every day | ORAL | 0 refills | Status: DC
  Filled 2023-01-14: qty 15, 30d supply, fill #0

## 2023-01-14 MED ORDER — ZOLPIDEM TARTRATE 5 MG PO TABS
5.0000 mg | ORAL_TABLET | Freq: Every evening | ORAL | 0 refills | Status: AC | PRN
  Filled 2023-01-14: qty 28, 14d supply, fill #0
  Filled 2023-01-14: qty 14, 14d supply, fill #0

## 2023-01-14 MED ORDER — SPIRONOLACTONE 25 MG PO TABS
12.5000 mg | ORAL_TABLET | Freq: Every day | ORAL | 0 refills | Status: DC
  Filled 2023-01-14: qty 15, 30d supply, fill #0

## 2023-01-14 MED ORDER — TRAZODONE HCL 150 MG PO TABS
150.0000 mg | ORAL_TABLET | Freq: Every evening | ORAL | 0 refills | Status: AC | PRN
  Filled 2023-01-14: qty 30, 30d supply, fill #0

## 2023-01-14 NOTE — Progress Notes (Signed)
PROGRESS NOTE   Subjective/Complaints:  Looking forward to going home today.  Patient reports nursing coming back to remove sutures, every other on left lower extremity.  Pain is under control.  LBM yesterday 12/15  ROS:    Pt denies fever, chills, SOB, abd pain, CP, N/V/C/D, and vision changes    Objective:   No results found. Recent Labs    01/14/23 0518  WBC 6.1  HGB 8.8*  HCT 27.8*  PLT 402*    Recent Labs    01/14/23 0518  NA 132*  K 3.3*  CL 97*  CO2 26  GLUCOSE 102*  BUN 6*  CREATININE 0.87  CALCIUM 8.7*     Intake/Output Summary (Last 24 hours) at 01/14/2023 1311 Last data filed at 01/14/2023 0756 Gross per 24 hour  Intake 240 ml  Output 500 ml  Net -260 ml        Physical Exam: Vital Signs Blood pressure 124/70, pulse 75, temperature 97.6 F (36.4 C), temperature source Oral, resp. rate 18, height 6\' 3"  (1.905 m), weight 125.2 kg, SpO2 97%.         General: awake, alert, appropriate, sitting up in bedside chair; NAD HENT: conjugate gaze; oropharynx moist CV: regular rate and rhythm; no JVD Pulmonary: CTAB, no RRW GI: soft, NT, ND, (+)BS Psychiatric: appropriate, a little anxious  Neurological: Ox3 Skin: Sutures look great on LLE and abdomen- no drainage or erythema Extremities: RLE 1-2+ and LLE 2-3+ but stable  Neuro: Alert and awake, follows commands, no cognitive deficits noted.  Moving all 4 extremities to gravity and resistance  Prior Exam:  Neuro: AAOx4. No apparent cognitive deficits   DTRs: Reflexes were 2+ in bilateral achilles, patella, biceps, BR and triceps. Sensory exam: revealed normal sensation in all dermatomal regions in bilateral upper extremities and bilateral lower extremities Motor exam: strength 5/5 throughout bilateral upper extremities, bilateral lower extremities, and with exception of 5- out of 5 in bilateral hip flexors while maintaining upper  body range of motion restrictions. Coordination: Fine motor coordination was normal.  Stable 12/16     Assessment/Plan: 1. Functional deficits which require 3+ hours per day of interdisciplinary therapy in a comprehensive inpatient rehab setting. Physiatrist is providing close team supervision and 24 hour management of active medical problems listed below. Physiatrist and rehab team continue to assess barriers to discharge/monitor patient progress toward functional and medical goals  Care Tool:  Bathing    Body parts bathed by patient: Right arm, Left arm, Chest, Abdomen, Front perineal area, Buttocks, Right upper leg, Left upper leg, Face, Right lower leg, Left lower leg         Bathing assist Assist Level: Independent with assistive device     Upper Body Dressing/Undressing Upper body dressing   What is the patient wearing?: Pull over shirt    Upper body assist Assist Level: Independent with assistive device    Lower Body Dressing/Undressing Lower body dressing      What is the patient wearing?: Underwear/pull up, Pants     Lower body assist Assist for lower body dressing: Independent with assitive device     Toileting Toileting    Toileting assist Assist  for toileting: Independent     Transfers Chair/bed transfer  Transfers assist     Chair/bed transfer assist level: Independent with assistive device     Locomotion Ambulation   Ambulation assist      Assist level: Independent with assistive device Assistive device: Walker-rolling Max distance: >300 ft   Walk 10 feet activity   Assist     Assist level: Independent with assistive device Assistive device: Walker-rolling   Walk 50 feet activity   Assist    Assist level: Independent with assistive device Assistive device: Walker-rolling    Walk 150 feet activity   Assist    Assist level: Independent with assistive device Assistive device: Walker-rolling    Walk 10 feet on  uneven surface  activity   Assist     Assist level: Independent with assistive device Assistive device: Walker-rolling   Wheelchair     Assist Is the patient using a wheelchair?: No      Wheelchair assist level: Dependent - Patient 0% Max wheelchair distance: 0    Wheelchair 50 feet with 2 turns activity    Assist        Assist Level: Dependent - Patient 0%   Wheelchair 150 feet activity     Assist  Wheelchair 150 feet activity did not occur:  (limited 2/2 endurance deficit)   Assist Level: Dependent - Patient 0%   Blood pressure 124/70, pulse 75, temperature 97.6 F (36.4 C), temperature source Oral, resp. rate 18, height 6\' 3"  (1.905 m), weight 125.2 kg, SpO2 97%.    Medical Problem List and Plan: 1. Functional deficits secondary to cardiac debility status post CABG             -patient may not shower unless abdominal and LLE incisions covered             -ELOS/Goals: 7 to 10 days, modified independent PT/OT   D/c  12/16 Con't CIR PT and OT Will remove Sutures Monday before goes home on upper abdomen and LLE every other suture- will write order Sunday- ordered today for tomorrow  Educated pt at length about Afib and how to feel having it; how to diagnose it himself and how to address it  my concern is if gets >110 heart rate- d/w pt.  Provided further education about afib, f/u with cardiology  2.  Antithrombotics: -DVT/anticoagulation:  Pharmaceutical: Eliquis             -antiplatelet therapy: Aspirin 81 mg daily             - LLE duplex on admission for increasing edema   3. Pain Management: Tylenol, tramadol as needed   12/9- denied significant pain- just soreness   12/13- not having a lot of pain 4. Mood/Behavior/Sleep: LCSW to evaluate and provide emotional support             -trazodone 100 mg q HS prn             -antipsychotic agents: n/a   5. Neuropsych/cognition: This patient IS capable of making decisions on HIS own behalf.   6.  Skin/Wound Care: Routine skin care checks   7. Fluids/Electrolytes/Nutrition: Strict Is and Os and follow-up chemistries   8: Hypertension- with hypotension: monitor TID and prn (see #10 meds below)    12/10- Hypotension- have called Cards to see if can get help on whether or not to add Midodrine, or reduce meds to help with orthostatic hypotension- occurring when stands, which is limiting therapy  12/11- Reduced Aldactone to 12.5 mg daily and Toprol to 12. 5mg  daily- will wait to  reduce Torsemide but can reduce to 20 mg daily, per Cards.   12/12 BP controlled, monitor response to medication change  12/13-12/14 BP controlled- and less hypotension per pt and therapy notes  12/15- BP a little low this Am 99 systolic, however says light headedness just a  few seconds when stands up -no actual presyncope Sx's.con't regimen for now- might need Cards to stop Aldactone in f/u.  12/16 BP stable, continue current regimen. F/U cardiology    01/14/2023    8:39 AM 01/14/2023    6:26 AM 01/14/2023    5:23 AM  Vitals with BMI  Weight  276 lbs   BMI  34.5   Systolic 124  121  Diastolic 70  72  Pulse 75  78     9: Hyperlipidemia: continue statin   10: Acute systolic CHF (cardiogenic shock>>resolved)             -daily weight             -continue digoxin 0.125 mg daily             -continue spironolactone 25 mg daily                         -continue torsemide 40 mg daily             -continue amiodarone 200 mg BID   12/9- torsemide was reduced over the weekend- weight not done this AM-   12/10-- weight not done- was 127.8 yesterday- will ask nursing to weigh daily.   12/11- weight 126/3 kg- doing better  12/13- Weight 125.5 kg- has been losing weight somewhat- won't decrease Torsemide more due to LE edema- using ACE wraps in therapy  12/14- Weight stable last 2 days- it appears losing edema/fluid to me on exam- con't ACE wraps  12/15- Weight 125.3 kg- stable- con't regimen  -12/16 wt stable,  continue current Filed Weights   01/12/23 0543 01/13/23 0532 01/14/23 0626  Weight: 125.4 kg 125.3 kg 125.2 kg    11: CAD:             -s/p CABG 12/02             -follow-up with Dr. Laneta Simmers             -continue asa 325 mg daily   12: Atrial fibrillation:             -on Eliquis 5 mg BID             -continue digoxin 0.125 mg daily             -continue amiodarone 200 mg BID  -Potassium repletion as below   12/15- Dig level was 0.8- stable 13: morbid  Obesity: BMI = 36.17, provided dietary education  12/9- weight down to 35.07-    14: ABLA: continue Trigels-F Forte daily  -follow-up CBC   12/9- HB up to 7.5 from 7.3- so doing better- con't to monitor 2x/week  15: Hyponatremia: trending up slowly -follow-up BMP   12/9- Na 130- is down from 132, but was 129 the day prior- will monitor-   12/10- Na 131  12/16 NA stable 132 16: Thrombocytopenia: improving, no bleeding; follow-up CBC   1211- Plts up to 272k  12/16 Thrombocytosis now, likely reactive, f/u with PCP 17: Hypokalemia: Klor-Con 20 mEq BID  12/9- will replete 40 meq x2 and start KCL 20 meq tomorrow/daily.   12/10- K+ 4.1-   12/12 potassium 3.8, will give additional 20 mEq today due to his history of A-fib for goal greater than 4  12/14- labs Monday -   12/15- will also check Mg level just in case  12/16 K+ a little low at 3.3, give extra , Recommend recheck levels with PCP or cardiology after DC 18: Nausea: Reglan 10 mg TID   12/11- resolved- will look into reducing reglan- will d/w pt 20: GERD?: Protonix 80 mg  (home on Nexium 40 mg BID)   21: Cough?: on Mucinex 600 mg BID, incentive spirometer ordered   12/13- Has tessalon pearls, but didn't feel as helpful as could be- asking for cough drops- will order 22. Constipation/nausea. LBM 2 days ago. Add sennakot s 1 tab at bedtime. Has PRN sorbitol. Las BM 12/5, added miralax BID  12/9- will give Sorbitol 30 ml x 1 at 1500- and increase Senna  to 1 tab/day.    -nausea is better- and dizizness Better  12/14- LBM yesterday  12/15- took Sorbitol this Am since LBM 2 days ago-   12/16 LBM yesterday 23. Orthostasis: discussed hyponatremia could contribute, liberalized diet to regular, decrease torsemide to 30mg   12/9- BP a little soft in 100s- no weight today- weight 127.3 kg- will monitor trend, esp with reduction in Torsemide that was done over weekend.   -12/11- Will decrease Aldactone to 12.5 mg dail yand Toprol to 12.5 mg daily- per d/w Cards-   12/13- doing much better- con't regimen  12/14- continues to have better OH-  24. Edema: improving, decreased torsemide to 30mg   12/9- LE edema 1-2= - is improving per pt.   12/10- stable- 2-3+ on LLE- but 1-2+ on RLE  12/13- RLE 2+ edema today- will not decrease Torsemide more- and might need to increase? We will monitor  12/14- RLE is better today 1-2+, so better -will con't to wrap with ACE wraps during day.   12/15- asked OT to teach pt how to wrap legs with ACE wraps so can reduce swelling here and at home- had to cut off a TED at home in past.  25. Cramps: supplement mag and potassium   26. Magnesium deficiency: supplement IV mag 4 grams  12/9- Mg 1.6 yesterday- s/p IV mg- will recheck in AM  12/10- Mg 2.1  12/12 MG 1.9 stable  Mg 2.2 stable 27. Insomnia  12/9- increased trazodone to 150 mg at bedtime and added Ambien 5 mg at bedtime prn if needed.   12/10- changed Ambien to 10 mg at bedtime prn- and con't Trazodone- waking up ~ 1:30 am nightly for past few nights- didn't take sleep meds at home. 12/11- slept "slightly better'- explained don't feel comfortable increasing sleeping meds due to high dose of them - he voiced understanding  12/14- sleeping a little better     LOS: 10 days A FACE TO FACE EVALUATION WAS PERFORMED  Fanny Dance 01/14/2023, 1:11 PM

## 2023-01-14 NOTE — Progress Notes (Signed)
Inpatient Rehabilitation Discharge Medication Review by a Pharmacist  A complete drug regimen review was completed for this patient to identify any potential clinically significant medication issues.  High Risk Drug Classes Is patient taking? Indication by Medication  Antipsychotic No   Anticoagulant Yes Apixaban - Afib  Antibiotic No   Opioid No   Antiplatelet No   Hypoglycemics/insulin No   Vasoactive Medication Yes Metoprolol - BP Amiodarone, digoxin - Afib Spironolactone, torsemide - diuresis   Chemotherapy No   Other Yes Potassium - supplement Zolpidem prn sleep Trazodone - prn sleep Esomeprazole - Reflux Nitroglycerin prn CP Rosuvastatin - HLD Trigels - supplement     Type of Medication Issue Identified Description of Issue Recommendation(s)  Drug Interaction(s) (clinically significant)     Duplicate Therapy     Allergy     No Medication Administration End Date     Incorrect Dose     Additional Drug Therapy Needed     Significant med changes from prior encounter (inform family/care partners about these prior to discharge).    Other       Clinically significant medication issues were identified that warrant physician communication and completion of prescribed/recommended actions by midnight of the next day:  No  Pharmacist comments: None  Time spent performing this drug regimen review (minutes):  20 minutes   Thank you Okey Regal, PharmD

## 2023-01-14 NOTE — Progress Notes (Signed)
Inpatient Rehabilitation Care Coordinator Discharge Note   Patient Details  Name: Jeremiah Dixon MRN: [id number] Date of Birth: 1960-06-19   Discharge location: HOME WITH INTERMITTENT ASSIST FROM BROTHER  Length of Stay: 10 DAYS  Discharge activity level: MOD/I LEVEL  Home/community participation: ACTIVE  Patient response WU:[id number] Literacy - How often do you need to have someone help you when you read instructions, pamphlets, or other written material from your doctor or pharmacy?: Never  Patient response [id number] Isolation - How often do you feel lonely or isolated from those around you?: Rarely  Services provided included: MD, RD, PT, OT, SLP, RN, CM, TR, Pharmacy, Neuropsych, SW  Financial Services:  Field seismologist Utilized: Chartered loss adjuster  Choices offered to/list presented to: PT AND BROTHER  Follow-up services arranged:  Outpatient, DME, Patient/Family has no preference for HH/DME agencies    Outpatient Servicies: [facility] WILL CALL TO SET UP FOLLOW UP APPOINTMENTS DME : [facility]    Patient response to transportation need: Is the patient able to respond to transportation needs?: Yes In the past 12 months, has lack of transportation kept you from medical appointments or from getting medications?: No In the past 12 months, has lack of transportation kept you from meetings, work, or from getting things needed for daily living?: No   Patient/Family verbalized understanding of follow-up arrangements:  Yes  Individual responsible for coordination of the follow-up plan: BILL-BROTHER [phone]  Confirmed correct DME delivered: Lucy Chris 01/14/2023    Comments (or additional information):PT DID WELL AND REACHED GOALS TO BE SAFE HOME ALONE WITH BROTHER ASSISTING WITH GROCERIES, TRANSPORT AND LAUNDRY  Summary of Stay    Date/Time Discharge Planning CSW  01/08/23 1015 Home alone with intermittent  assist from brother-he will do grocery shopping, laundry and meals. Pt will need to be mod/i level prior to DC RGD       Sophee Mckimmy, Lemar Livings

## 2023-01-14 NOTE — Discharge Summary (Signed)
Physician Discharge Summary  Patient ID: Lional Nicklaus MRN: [id number] DOB/AGE: 1960-12-04 62 y.o.  Admit date: 01/04/2023 Discharge date: 01/14/2023  Discharge Diagnoses:  Principal Problem:   Debility Active Problems:   Difficulty coping Debility secondary to coronary artery disease Hypertension Hyperlipidemia Acute systolic congestive heart failure Atrial fibrillation Obesity Acute blood loss anemia Hyponatremia Hypokalemia Thrombocytopenia Constipation GERD Cough Orthostasis Insomnia  Discharged Condition: stable  Significant Diagnostic Studies: VAS [place] LOWER EXTREMITY VENOUS (DVT) Result Date: 01/07/2023  Lower Venous DVT Study Patient Name:  YAN RICHARDSON  Date of Exam:   01/06/2023 Medical Rec #: [id number]             Accession #:    [id number] Date of Birth: 03/29/1960             Patient Gender: M Patient Age:   48 years Exam Location:  [facility] Procedure:      VAS [place] LOWER EXTREMITY VENOUS (DVT) Referring Phys: Elijah Birk --------------------------------------------------------------------------------  Indications: Edema. Other Indications: Recent open saphenous vein harvest for CABG. Limitations: Edema and bandages. Comparison Study: Prior negative bilateral LEV done 12/06/22 Performing Technologist: Sherren Kerns RVS  Examination Guidelines: A complete evaluation includes B-mode imaging, spectral Doppler, color Doppler, and power Doppler as needed of all accessible portions of each vessel. Bilateral testing is considered an integral part of a complete examination. Limited examinations for reoccurring indications may be performed as noted. The reflux portion of the exam is performed with the patient in reverse Trendelenburg.  +-----+---------------+---------+-----------+----------+--------------+ RIGHTCompressibilityPhasicitySpontaneityPropertiesThrombus Aging +-----+---------------+---------+-----------+----------+--------------+ CFV  Full            Yes      Yes                                 +-----+---------------+---------+-----------+----------+--------------+   +---------+---------------+---------+-----------+----------+--------------+ LEFT     CompressibilityPhasicitySpontaneityPropertiesThrombus Aging +---------+---------------+---------+-----------+----------+--------------+ CFV      Full           Yes      No                                  +---------+---------------+---------+-----------+----------+--------------+ SFJ      Full           Yes      No                                  +---------+---------------+---------+-----------+----------+--------------+ FV Prox  Full           Yes      Yes                                 +---------+---------------+---------+-----------+----------+--------------+ FV Mid   Full                                                        +---------+---------------+---------+-----------+----------+--------------+ FV DistalFull           Yes      No                                  +---------+---------------+---------+-----------+----------+--------------+  PFV      Full                                                        +---------+---------------+---------+-----------+----------+--------------+ POP      Full           Yes                                          +---------+---------------+---------+-----------+----------+--------------+ PTV      Full                                                        +---------+---------------+---------+-----------+----------+--------------+ PERO     Full                                                        +---------+---------------+---------+-----------+----------+--------------+ GSV      Full                                                        +---------+---------------+---------+-----------+----------+--------------+ Hematoma noted at GSV harvest sites   Summary: RIGHT: - No evidence  of common femoral vein obstruction.   LEFT: - There is no evidence of deep vein thrombosis in the lower extremity.  - No cystic structure found in the popliteal fossa. - Hematoma noted at harvest sites  *See table(s) above for measurements and observations. Electronically signed by Coral Else MD on 01/07/2023 at 12:15:36 PM.    Final    DG CHEST PORT 1 VIEW Result Date: 01/03/2023 CLINICAL DATA:  Status post CABG . EXAM: PORTABLE CHEST 1 VIEW COMPARISON:  01/02/2023 FINDINGS: Left lung base is not been included on the film. Left chest tube seen previously has probably been removed in the interval. Mediastinal/pericardial drain seen at the midline on the prior study is not evident today. No evidence for pneumothorax interstitial markings remain coarsened with probable atelectasis at the left base. Potential small left effusion. The cardio pericardial silhouette is enlarged. Bones are diffusely demineralized. Telemetry leads overlie the chest. IMPRESSION: 1. Left lung base has not been included on the film. 2. Interval removal of left chest tube and mediastinal/pericardial drain. No evidence for pneumothorax. 3. Persistent coarsening of interstitial markings with probable atelectasis at the left base and potential small left effusion. Electronically Signed   By: Kennith Center M.D.   On: 01/03/2023 10:47     Labs:  Basic Metabolic Panel: Recent Labs  Lab 01/08/23 0622 01/10/23 0636 01/14/23 0518  NA 131* 131* 132*  K 4.1 3.8 3.3*  CL 94* 95* 97*  CO2 28 27 26   GLUCOSE 100* 93 102*  BUN 7* 7* 6*  CREATININE 0.73 0.81 0.87  CALCIUM 8.7* 8.5* 8.7*  MG 2.1 1.9 2.2    CBC: Recent Labs  Lab 01/10/23 0636 01/14/23 0518  WBC 5.6 6.1  NEUTROABS 3.6 3.7  HGB 7.9* 8.8*  HCT 23.9* 27.8*  MCV 94.8 94.6  PLT 365 402*    CBG: Recent Labs  Lab 01/08/23 1636  GLUCAP 98    Brief HPI:   Jeremiah Dixon is a 62 y.o. male s/p CABG times 3 vessels, pulmonary vein isolation and LAA clipping  by Dr. Laneta Simmers on 12/31/2022. He was recently admitted 11/07 secondary to PEA arrest, underwent cardiac catheterization and plan for CABG. Started long-term anti-coagulation for atrial fibrillation. He was discharged and admitted to CIR on 11/20. Discharge home on 11/26. He has done well post-operatively. Extubated, chest tubes removed and ambulating with PT. Diuresis continued. Constipation addressed. Plan resuming Eliquis at discharge.    Hospital Course: Moath Lum was admitted to rehab 01/04/2023 for inpatient therapies to consist of PT, ST and OT at least three hours five days a week. Past admission physiatrist, therapy team and rehab RN have worked together to provide customized collaborative inpatient rehab. Follow-up labs sodium 132 and liberalized diet to regular. Hypokalemia corrected.  Complained of dizziness and orthostasis and torsemide was decreased to 30 mg.  H and H continued upward trend. Hgb 8.8 at discharge.  Thrombocytopenia resolved.   Complained of muscular cramps and magnesium supplement 4 g IV given.Trazodone increased to 150 mg at bedtime and Ambien 5 mg as needed for insomnia.  Increased Ambien to 10 mg at bedtime as needed for poor sleep.  Cardiology contacted 12/10 due to persistent orthostasis.  Aldactone reduced to 12.5 mg daily and Toprol to 12.5 mg daily and monitored. Chest tube sutures removed and every other suture from LLE. Continued ACE wraps to BLE for edema.   Blood pressures were monitored on TID basis and digoxin 0.125 mg daily, spironolactone 25 mg daily, torsemide 40 mg daily, amiodarone 200 mg BID continued at admission.  Torsemide decreased to 30 mg daily on 12/08 due to orthostasis.   Rehab course: During patient's stay in rehab weekly team conferences were held to monitor patient's progress, set goals and discuss barriers to discharge. At admission, patient required  He has had improvement in activity tolerance, balance, postural control as well as  ability to compensate for deficits. He has had improvement in functional use RUE/LUE  and RLE/LLE as well as improvement in awareness.Patient has met 10 of 10 long term goals due to improved activity tolerance, improved balance, postural control, ability to compensate for deficits, and improved awareness.  Patient to discharge at overall Modified Independent level.  Patient's care partner is independent to provide the necessary  IADL  assistance at discharge and provide transportation to doctors appointments/therapy.     Outpatient PT and OT arranged through [facility].  Discharge disposition: 01-Home or Self Care     Diet: Regular  Special Instructions: No driving, alcohol consumption or tobacco use.  Discharge Instructions     Discharge patient   Complete by: As directed    Discharge disposition: 01-Home or Self Care   Discharge patient date: 01/14/2023      Allergies as of 01/14/2023       Reactions   Bee Venom Palpitations   Diclofenac Diarrhea   Amoxicillin Diarrhea, Nausea Only   Lisinopril Cough   Pantoprazole Nausea Only   Other Reaction(s): chest pain        Medication List     STOP taking these medications  oxyCODONE 5 MG immediate release tablet Commonly known as: Oxy IR/ROXICODONE       TAKE these medications    acetaminophen 325 MG tablet Commonly known as: TYLENOL Take 1-2 tablets (325-650 mg total) by mouth every 4 (four) hours as needed for mild pain (pain score 1-3).   amiodarone 200 MG tablet Commonly known as: PACERONE Take 1 tablet (200 mg total) by mouth 2 (two) times daily.   apixaban 5 MG Tabs tablet Commonly known as: ELIQUIS Take 1 tablet (5 mg total) by mouth 2 (two) times daily.   Aspirin Low Dose 81 MG chewable tablet Generic drug: aspirin Chew 1 tablet (81 mg total) by mouth daily.   digoxin 0.125 MG tablet Commonly known as: LANOXIN Take 1 tablet (0.125 mg total) by mouth daily.   esomeprazole 40 MG  capsule Commonly known as: NEXIUM Take 1 capsule (40 mg total) by mouth 2 (two) times daily before a meal.   guaiFENesin 600 MG 12 hr tablet Commonly known as: MUCINEX Take 1 tablet (600 mg total) by mouth 2 (two) times daily.   metoprolol succinate 25 MG 24 hr tablet Commonly known as: TOPROL-XL Take 0.5 tablets (12.5 mg total) by mouth daily. Start taking on: January 15, 2023 What changed: how much to take   nitroGLYCERIN 0.4 MG SL tablet Commonly known as: NITROSTAT Place 0.4 mg under the tongue every 5 (five) minutes as needed.   potassium chloride SA 20 MEQ tablet Commonly known as: KLOR-CON M Take 1 tablet (20 mEq total) by mouth daily. Start taking on: January 15, 2023   psyllium 95 % Pack Commonly known as: HYDROCIL/METAMUCIL Take 1 packet by mouth daily.   rosuvastatin 20 MG tablet Commonly known as: CRESTOR Take 1 tablet (20 mg total) by mouth daily.   spironolactone 25 MG tablet Commonly known as: ALDACTONE Take 0.5 tablets (12.5 mg total) by mouth daily. Start taking on: January 15, 2023 What changed: how much to take   torsemide 10 MG tablet Commonly known as: DEMADEX Take 3 tablets (30 mg total) by mouth daily. Start taking on: January 15, 2023 What changed:  medication strength how much to take   traZODone 150 MG tablet Commonly known as: DESYREL Take 1 tablet (150 mg total) by mouth at bedtime as needed for sleep. What changed:  medication strength how much to take   Trigels-F Forte Caps capsule Generic drug: Fe Fum-Vit C-Vit B12-FA Take 1 capsule by mouth daily.   zolpidem 5 MG tablet Commonly known as: AMBIEN Take 1 tablet (5 mg total) by mouth at bedtime as needed for sleep (if trazodone doesn't work).        Follow-up Information     Perlie Gold, PA-C. Go to.   Specialty: Cardiology Contact information: [address] [place] [address] [phone]         [name], MD. Go to.   Specialty:  Cardiothoracic Surgery Contact information: [address] [place] [address] [phone]         Sandre Kitty, PA-C. Schedule an appointment as soon as possible for a visit.   Specialty: Physician Assistant Why: Call the office in 1-2 days to make arrangments for hospital follow-up appointment. Contact information: [address] [place] [address] [phone]         Genice Rouge, MD Follow up.   Specialty: Physical Medicine and Rehabilitation Why: As needed Contact information: [address] [place] [address]  [phone]                 Signed: Milinda Antis 01/14/2023, 11:35 AM

## 2023-01-14 NOTE — Progress Notes (Signed)
Patient discharged to home. Accompanied by his family.

## 2023-01-16 LAB — ECHO TEE

## 2023-01-21 NOTE — Progress Notes (Signed)
Cardiology Office Note:  .   Date:  01/25/2023  ID:  Shirlean Mylar, DOB March 17, 1960, MRN [id number] PCP: Marya Landry  [facility] Providers Cardiologist: Dr. Gala Romney  }   History of Present Illness: .   Mayjor Dixon is a 62 y.o. male with history of CABG x 3, pulmonary vein isolation and LAA clipping by Dr. Laneta Simmers on 12/31/2022, after being admitted for PEA arrest on 12/06/2022. ROSC after 25 minutes.  He was also found to have have A-fib with RVR on admission and converted pharmacologically to normal sinus rhythm on amiodarone drip with conversion to p.o. amiodarone.  He did undergo TEE DCCV by Dr. Gala Romney converting him to normal sinus rhythm.  He remains on amiodarone and Eliquis.   He was followed closely by Dr. Gala Romney due to cardiogenic shock with EF of 35%.  He had a right and left heart cath on 12/10/2022 showing severe three-vessel disease, mild to moderate PAH and normal output.  Follow-up echocardiogram revealed an EF of 40 to 45% with normal RV.  He was initially placed on Entresto, spironolactone digoxin until CABG.   Other history includes morbid obesity, hypertension, hyperlipidemia, alcohol abuse and prediabetes.  The patient was admitted to rehab on 01/04/2023 for inpatient therapies to consist of PT ST and OT at least 3 to 5 hours 5 days a week.  He is here for posthospitalization follow-up.  He states that he is feeling much better after leaving rehabilitation center on 01/14/2023.  He lives in a the second floor apartment and is able to climb stairs without having to stop that he continues to have some mild shortness of breath.  He stops on the landing in between flights to rest.  There is also a breezeway in between the floors that he walks about 10 to 15 minutes daily and is able to do so without chest pain or excessive shortness of breath or fatigue.  He also uses hand weights as instructed by physical therapy team during inpatient  evaluation.  He is interested in moving forward with cardiac rehab.  He is concerned about his insurance changing to [employer] after January 30, 2023 and if he will be able to be followed by Dr. Sampson Goon in the [facility] health heart team.  He is understanding that his insurance does not cover this.  He will be connected to our billing department to see for sure what is covered so that he may continue [facility] cardiology treatment for consistency of care.  He is due to see CVTS on Monday, January 28, 2023.  He is currently not working or driving.  In fact he is unemployed.  His brother takes him to different doctors appointments.    ROS: As above otherwise negative  Studies Reviewed: .   Significant Diagnostic Studies: angiography:      Prox RCA to Mid RCA lesion is 100% stenosed.   Prox RCA lesion is 60% stenosed.   Ost LAD lesion is 70% stenosed.   Mid LM to Dist LM lesion is 70% stenosed.   Ost Cx lesion is 100% stenosed.   Mid LAD lesion is 95% stenosed.   Prox LAD to Mid LAD lesion is 50% stenosed.   2nd Diag lesion is 40% stenosed.   Findings:   Ao = 101/66 (79)  LV = 99/12 RA = 8 RV = 45/10 PA = 53/15 (29) PCW = 12 Fick cardiac output/index = 8.1/3.2 PVR = 2.1 WU  Ao sat = 89% PA sat = 55%, 54% PAPi = 4.6   Assessment: 1. Severe 3v heavily calcified CAD with CTO of Ostial LCx and midRCA. Mid LAD 95% 2. Unable to assess LVEF by V-gram  3. Mild to moderate PAH with normal PCWP and output  Procedure:   Median Sternotomy Extracorporeal circulation 3.   Coronary artery bypass grafting x 3   Left internal mammary artery graft to the LAD SVG to diagonal SVG to OM   4.   Open vein harvest from left lower leg. 5.   Pulmonary vein isolation ablation using Encompass clamp 6.   Clipping of left atrial appendage.   EKG Interpretation Date/Time:  Friday January 25 2023 15:17:34 EST Ventricular Rate:  77 PR Interval:  272 QRS Duration:  110 QT  Interval:  426 QTC Calculation: 482 R Axis:   72  Text Interpretation: Sinus rhythm with 1st degree A-V block T wave abnormality, consider inferior ischemia Prolonged QT When compared with ECG of 01-Jan-2023 06:37, Non-specific change in ST segment in Anterior leads T wave inversion less evident in Inferior leads Nonspecific T wave abnormality now evident in Anterior leads T wave inversion no longer evident in Lateral leads Confirmed by Joni Reining [phone]) on 01/25/2023 4:27:09 PM    Physical Exam:   VS:  BP 132/86 (BP Location: Left Arm, Patient Position: Sitting, Cuff Size: Large)   Pulse 77   Ht 6\' 2"  (1.88 m)   Wt 272 lb (123.4 kg)   SpO2 100%   BMI 34.92 kg/m    Wt Readings from Last 3 Encounters:  01/25/23 272 lb (123.4 kg)  01/14/23 276 lb 0.3 oz (125.2 kg)  01/04/23 280 lb 3.3 oz (127.1 kg)    GEN: Well nourished, well developed in no acute distress NECK: No JVD; No carotid bruits CARDIAC: RRR, no murmurs, rubs, gallops sternotomy site is well-healed without evidence of infection. RESPIRATORY:  Clear to auscultation without rales, wheezing or rhonchi  ABDOMEN: Soft, non-tender, non-distended EXTREMITIES:  No edema; No deformity left lower extremities SVG site continues to have sutures and is weeping at the incision line.  There is mild edema noted.  Still very sore to touch.  Right knee with walking brace.  ASSESSMENT AND PLAN: .   Status post PEA arrest: Patient was down for 28 minutes and revived.  He was in A-fib RVR on resuscitation.  He went into heart failure with an EF of 35% initially.  Patient had left and right heart cath with severe three-vessel disease.  He was subsequently seen by CVTS for consultation for CABG.  2.  Coronary artery disease: Per left heart and right heart cath per Dr. Gala Romney.  Patient had severe three-vessel heavily calcified CAD CAD with complete total occlusion of the ostial circumflex, and mid RCA, the mid LAD 95% stenosis.  He was found  to have moderate to mild PAH.  3.  Atrial fib with RVR: Initially on amiodarone drip, had TEE/DCCV cardioversion per Dr. Gala Romney on 12/14/2022.  Converting him to normal sinus rhythm.  Today EKG reveals normal sinus rhythm with first-degree AV block.  He remains on Eliquis 5 mg twice a day, digoxin 0.125 mg daily and amiodarone 200 mg twice a day.   4. Status post CABG x 3.:  Completed by Dr. Laneta Simmers with LIMA to LAD, SVG to diagonal and SVG to OM with clipping of the atrial appendage on 01/04/2023..  The patient did well post CABG.  He is feeling better has  not yet been seen by CVTS is due to see them on January 28, 2023.  From a cardiac standpoint I believe he is okay to move forward with cardiac rehab at the discretion of Dr. Laneta Simmers.  5.  Severe deconditioning: Status post 2 weeks in physical rehab center.  He continues to walk 10 to 15 minutes a day, is able to climb a flight of stairs but rest in between, so that he can get to his second floor apartment.  Hopefully he will be able to move forward with cardiac rehab.  6.  Unknown lipid status: I do not see where lipid follow-up was completed during hospitalization.  I would like to get some labs on him today but he believes will be getting labs when he sees his surgeon.  Of these are not completed would like to get fasting lipids so that we can assess his status.  It is noted that he is not on any statin therapy at this time.  With known history of CAD with CABG would be prudent to have that him on this for cardioprotection.  On review of allergies he does not appear to be in tolerant to statins.  7.  Chronic systolic CHF: The patient has an EF of 40%.  He will be followed by Dr. Gala Romney  consider adding SG LT. labs on 01/14/2023, creatinine 0.67, potassium 3.3, sodium 132.  These will need to be repeated on follow-up.    Cardiac Rehabilitation Eligibility Assessment           Signed, Bettey Mare. Liborio Nixon, ANP, AACC

## 2023-01-25 ENCOUNTER — Other Ambulatory Visit: Payer: Self-pay | Admitting: Surgery

## 2023-01-25 ENCOUNTER — Encounter: Payer: Self-pay | Admitting: Adult Health

## 2023-01-25 ENCOUNTER — Ambulatory Visit: Payer: Managed Care, Other (non HMO) | Attending: Adult Health | Admitting: Adult Health

## 2023-01-25 VITALS — BP 132/86 | HR 77 | Ht 74.0 in | Wt 272.0 lb

## 2023-01-25 DIAGNOSIS — I4891 Unspecified atrial fibrillation: Secondary | ICD-10-CM | POA: Diagnosis not present

## 2023-01-25 DIAGNOSIS — I2510 Atherosclerotic heart disease of native coronary artery without angina pectoris: Secondary | ICD-10-CM | POA: Diagnosis not present

## 2023-01-25 DIAGNOSIS — J9601 Acute respiratory failure with hypoxia: Secondary | ICD-10-CM

## 2023-01-25 DIAGNOSIS — R570 Cardiogenic shock: Secondary | ICD-10-CM

## 2023-01-25 DIAGNOSIS — I10 Essential (primary) hypertension: Secondary | ICD-10-CM

## 2023-01-25 DIAGNOSIS — I469 Cardiac arrest, cause unspecified: Secondary | ICD-10-CM

## 2023-01-25 DIAGNOSIS — I5022 Chronic systolic (congestive) heart failure: Secondary | ICD-10-CM

## 2023-01-25 DIAGNOSIS — Z951 Presence of aortocoronary bypass graft: Secondary | ICD-10-CM

## 2023-01-25 DIAGNOSIS — I2583 Coronary atherosclerosis due to lipid rich plaque: Secondary | ICD-10-CM

## 2023-01-25 DIAGNOSIS — R609 Edema, unspecified: Secondary | ICD-10-CM

## 2023-01-25 MED ORDER — METOPROLOL SUCCINATE ER 25 MG PO TB24: 12.5000 mg | ORAL_TABLET | Freq: Every day | ORAL | 3 refills | Status: DC

## 2023-01-25 MED ORDER — TORSEMIDE 10 MG PO TABS: 30.0000 mg | ORAL_TABLET | Freq: Three times a day (TID) | ORAL | 3 refills | Status: DC

## 2023-01-25 MED ORDER — ROSUVASTATIN CALCIUM 20 MG PO TABS: 20.0000 mg | ORAL_TABLET | Freq: Every day | ORAL | 3 refills | Status: AC

## 2023-01-25 MED ORDER — APIXABAN 5 MG PO TABS: 5.0000 mg | ORAL_TABLET | Freq: Two times a day (BID) | ORAL | 3 refills | Status: DC

## 2023-01-25 MED ORDER — SPIRONOLACTONE 25 MG PO TABS: 12.5000 mg | ORAL_TABLET | Freq: Every day | ORAL | 3 refills | Status: AC

## 2023-01-25 NOTE — Patient Instructions (Signed)
Medication Instructions:  NO CHANGES    Lab Work: NONE    Testing/Procedures: NONE   Follow-Up: At [facility], you and your health needs are our priority.  As part of our continuing mission to provide you with exceptional heart care, we have created designated Provider Care Teams.  These Care Teams include your primary Cardiologist (physician) and Advanced Practice Providers (APPs -  Physician Assistants and Nurse Practitioners) who all work together to provide you with the care you need, when you need it.    Your next appointment:    FIRST AVAILABLE   Provider:   DR. Dolores Patty, MD

## 2023-01-28 ENCOUNTER — Ambulatory Visit
Admission: RE | Admit: 2023-01-28 | Discharge: 2023-01-28 | Disposition: A | Discharge status: Home / Self Care | Payer: Managed Care, Other (non HMO) | Source: Ambulatory Visit | Attending: Surgery | Admitting: Surgery

## 2023-01-28 ENCOUNTER — Ambulatory Visit (INDEPENDENT_AMBULATORY_CARE_PROVIDER_SITE_OTHER): Payer: Self-pay | Admitting: Surgery

## 2023-01-28 ENCOUNTER — Encounter: Payer: Self-pay | Admitting: Surgery

## 2023-01-28 VITALS — BP 125/77 | HR 90 | Resp 20 | Ht 74.0 in | Wt 272.0 lb

## 2023-01-28 DIAGNOSIS — I2510 Atherosclerotic heart disease of native coronary artery without angina pectoris: Secondary | ICD-10-CM

## 2023-01-28 DIAGNOSIS — Z951 Presence of aortocoronary bypass graft: Secondary | ICD-10-CM

## 2023-01-29 ENCOUNTER — Other Ambulatory Visit: Payer: Self-pay | Admitting: *Deleted

## 2023-01-29 ENCOUNTER — Telehealth: Payer: Self-pay | Admitting: *Deleted

## 2023-01-29 MED ORDER — NITROGLYCERIN 0.4 MG SL SUBL: 0.4000 mg | SUBLINGUAL_TABLET | SUBLINGUAL | 3 refills | Status: AC | PRN

## 2023-01-29 MED ORDER — POTASSIUM CHLORIDE CRYS ER 20 MEQ PO TBCR: 20.0000 meq | EXTENDED_RELEASE_TABLET | Freq: Every day | ORAL | 3 refills | Status: DC

## 2023-01-29 MED ORDER — DIGOXIN 125 MCG PO TABS: 0.1250 mg | ORAL_TABLET | Freq: Every day | ORAL | 3 refills | Status: AC

## 2023-01-29 MED ORDER — AMIODARONE HCL 200 MG PO TABS: 200.0000 mg | ORAL_TABLET | Freq: Two times a day (BID) | ORAL | 3 refills | Status: AC

## 2023-01-29 NOTE — Telephone Encounter (Signed)
 Patient called about medications that were supposed to be refilled at his visit on January 25, 2023 in our office with Jeremiah Dixon. He was wondering why all 17 of his medications had not been refilled. I went through all of his medications with him, making sure that all of his cardiac medications had been refilled. I also had to stress to the patient that any medications that are not cardiac medications cannot be refilled by our office but has to be refilled by his Primary care Provider. He verbalized that he understood before the call was ended.

## 2023-01-31 ENCOUNTER — Other Ambulatory Visit: Payer: Self-pay

## 2023-01-31 ENCOUNTER — Telehealth: Payer: Self-pay | Admitting: Adult Health

## 2023-01-31 ENCOUNTER — Other Ambulatory Visit: Payer: Self-pay | Admitting: Surgery

## 2023-01-31 ENCOUNTER — Ambulatory Visit: Payer: Managed Care, Other (non HMO) | Admitting: Cardiology

## 2023-01-31 DIAGNOSIS — Z951 Presence of aortocoronary bypass graft: Secondary | ICD-10-CM

## 2023-01-31 MED ORDER — APIXABAN 5 MG PO TABS: 5.0000 mg | ORAL_TABLET | Freq: Two times a day (BID) | ORAL | 3 refills | Status: AC

## 2023-01-31 MED ORDER — METOPROLOL SUCCINATE ER 25 MG PO TB24: 12.5000 mg | ORAL_TABLET | Freq: Every day | ORAL | 3 refills | Status: DC

## 2023-01-31 NOTE — Telephone Encounter (Signed)
*  STAT* If patient is at the pharmacy, call can be transferred to refill team.   1. Which medications need to be refilled? (please list name of each medication and dose if known)  apixaban  (ELIQUIS ) 5 MG TABS tablet  metoprolol  succinate (TOPROL -XL) 25 MG 24 hr tablet   2. Which pharmacy/location (including street and city if local pharmacy) is medication to be sent to?  [id number] - [place], [place] - [address] AT [facility] [address]    3. Do they need a 30 day or 90 day supply? 90   *Patient states they did not receive this at the pharmacy and is requesting this be sent in again.

## 2023-01-31 NOTE — Telephone Encounter (Signed)
 Called and spoke with patient confirmed scripts have been sent to correct [facility] pharmacy.

## 2023-02-01 ENCOUNTER — Telehealth: Payer: Self-pay | Admitting: Adult Health

## 2023-02-01 NOTE — Telephone Encounter (Signed)
 Patient identification verified by 2 forms. Bertina Cooks, RN    Called and spoke to patient  Patient states:   -cost of eliquis  is really high   -would like cheaper alternative  Informed patient:   -eliquis  has a savings card option that he can apply for   -savings card can bring cost to as little as $10   -once application complete, present information to pharmacy  RN reviewed step by step instruction for completing application  During call patient completed application and received card information  Advised patient to take screenshot, write down all information and present to pharmacy  Encouraged patient to outreach if has any other questions  Patient verbalized understanding, no questions or concerns at this time    Mychart message also sent

## 2023-02-01 NOTE — Telephone Encounter (Signed)
 Pt c/o medication issue:  1. Name of Medication:   apixaban  (ELIQUIS ) 5 MG TABS tablet    2. How are you currently taking this medication (dosage and times per day)? As written   3. Are you having a reaction (difficulty breathing--STAT)? No   4. What is your medication issue? Pt called in stating his insurance is charging him $1000 for this rx. Please advise of other options.

## 2023-02-04 ENCOUNTER — Ambulatory Visit: Payer: Managed Care, Other (non HMO) | Admitting: Physical Therapy

## 2023-02-04 ENCOUNTER — Encounter: Payer: Managed Care, Other (non HMO) | Admitting: Occupational Therapy

## 2023-02-13 ENCOUNTER — Ambulatory Visit: Payer: Self-pay | Admitting: Surgery

## 2023-02-13 ENCOUNTER — Encounter: Payer: Self-pay | Admitting: Surgery

## 2023-02-13 VITALS — BP 145/91 | HR 78 | Resp 18 | Ht 74.0 in | Wt 265.0 lb

## 2023-02-13 DIAGNOSIS — Z951 Presence of aortocoronary bypass graft: Secondary | ICD-10-CM

## 2023-02-14 NOTE — Progress Notes (Signed)
HPI: Patient returns for routine postoperative follow-up having undergone coronary artery bypass graft surgery x 3 and pulmonary vein isolation ablation and clipping of left atrial appendage on 12/31/2022. The patient's early postoperative recovery while in the hospital was notable for an uncomplicated postoperative course.  He was discharged to inpatient rehab and is now home. Since hospital discharge the patient reports that he has been feeling well.  He is walking 1 to 1-1/2 miles at a time.  He denies any chest pain or shortness of breath.  His stamina continues to improve.  He has been watching his diet closely and has lost about 20-25 pounds.   Current Outpatient Medications  Medication Sig Dispense Refill   acetaminophen (TYLENOL) 325 MG tablet Take 1-2 tablets (325-650 mg total) by mouth every 4 (four) hours as needed for mild pain (pain score 1-3).     amiodarone (PACERONE) 200 MG tablet Take 1 tablet (200 mg total) by mouth 2 (two) times daily. 180 tablet 3   apixaban (ELIQUIS) 5 MG TABS tablet Take 1 tablet (5 mg total) by mouth 2 (two) times daily. 180 tablet 3   aspirin 81 MG chewable tablet Chew 1 tablet (81 mg total) by mouth daily. 30 tablet 0   digoxin (LANOXIN) 0.125 MG tablet Take 1 tablet (0.125 mg total) by mouth daily. 90 tablet 3   esomeprazole (NEXIUM) 40 MG capsule Take 1 capsule (40 mg total) by mouth 2 (two) times daily before a meal. 60 capsule 5   Fe Fum-Vit C-Vit B12-FA (TRIGELS-F FORTE) CAPS capsule Take 1 capsule by mouth daily. 30 capsule 0   guaiFENesin (MUCINEX) 600 MG 12 hr tablet Take 1 tablet (600 mg total) by mouth 2 (two) times daily.     metoprolol succinate (TOPROL-XL) 25 MG 24 hr tablet Take 0.5 tablets (12.5 mg total) by mouth daily. 45 tablet 3   nitroGLYCERIN (NITROSTAT) 0.4 MG SL tablet Place 1 tablet (0.4 mg total) under the tongue every 5 (five) minutes as needed. 75 tablet 3   potassium chloride SA (KLOR-CON M) 20 MEQ tablet Take 1 tablet (20 mEq  total) by mouth daily. 90 tablet 3   psyllium (HYDROCIL/METAMUCIL) 95 % PACK Take 1 packet by mouth daily.     rosuvastatin (CRESTOR) 20 MG tablet Take 1 tablet (20 mg total) by mouth daily. 90 tablet 3   spironolactone (ALDACTONE) 25 MG tablet Take 0.5 tablets (12.5 mg total) by mouth daily. 45 tablet 3   torsemide (DEMADEX) 10 MG tablet Take 3 tablets (30 mg total) by mouth 3 (three) times daily. 270 tablet 3   traZODone (DESYREL) 150 MG tablet Take 1 tablet (150 mg total) by mouth at bedtime as needed for sleep. 30 tablet 0   zolpidem (AMBIEN) 5 MG tablet Take 1 tablet (5 mg total) by mouth at bedtime as needed for sleep (if trazodone doesn't work). 14 tablet 0   No current facility-administered medications for this visit.    Physical Exam: BP (!) 145/91   Pulse 78   Resp 18   Ht 6\' 2"  (1.88 m)   Wt 265 lb (120.2 kg)   SpO2 95% Comment: RA  BMI 34.02 kg/m  He looks well. Cardiac exam shows a regular rate and rhythm with normal heart sounds.  There is no murmur. Lungs are clear. The chest incision is healing well and the sternum is stable. His leg incisions are healing well and there is no peripheral edema.  Diagnostic Tests: None today  Impression:  Overall I think he is doing very well 6 weeks following surgery.  He appears to be maintaining sinus rhythm.  He is on amiodarone and Eliquis.  I will let cardiology decide when to discontinue these.  I encouraged him to continue walking is much as possible and to participate in outpatient cardiac rehab.  He appears very motivated to modify his cardiac risk factors.  He said that he has not driven in years.  I asked him not to do any heavy lifting of more than 10 pounds for 3 months postoperatively.  Plan:  He will continue to follow-up with cardiology and will return to see me if he has any problems with his incisions.   [name], MD [facility] [phone]

## 2023-03-06 ENCOUNTER — Ambulatory Visit (HOSPITAL_COMMUNITY): Payer: Managed Care, Other (non HMO) | Admitting: Internal Medicine

## 2023-03-13 ENCOUNTER — Telehealth: Payer: Self-pay | Admitting: Adult Health

## 2023-03-13 NOTE — Telephone Encounter (Signed)
Pt c/o medication issue:  1. Name of Medication:    2. How are you currently taking this medication (dosage and times per day)?    3. Are you having a reaction (difficulty breathing--STAT)? no  4. What is your medication issue? Patient calling to get of the diuretic medication. So that he can go back to work. He states he wouldn't be able to go the rest room like that once he goes back to work. Please advise

## 2023-03-13 NOTE — Telephone Encounter (Signed)
Patient identification verified by 2 forms. Marilynn Rail, RN    Called and spoke to patient  Patient states:   -would like to discontinue diuretics   -takes Torsemide 30mg  daily and Spironolactone 12.5mg  daily   -he is concerned about starting work in the future due to frequent bathroom use   -would like to know if he needs to continue taking diuretics   -he no longer has fluid build up, does not know why Rx needed   -he recently had procedure for blockage in heart so fluid issue resolved  -would like to know which medications are necessary for him to take   -he no longer has insurance that accepts [facility]   -does not have a PCP or new cardiologist   -unable to schedule appointments  Informed patient:   -Diuretics are for his CHF, he no longer has fluid build up due to medications working properly   -message sent to provider regarding Rx concern  Patient verbalized understanding, no questions at this time

## 2023-03-14 MED ORDER — TORSEMIDE 20 MG PO TABS: 20.0000 mg | ORAL_TABLET | Freq: Every day | ORAL | 3 refills | Status: AC

## 2023-03-14 NOTE — Telephone Encounter (Signed)
Jeremiah Gross, NP  You31 minutes ago (2:40 PM)    He should continue the diuretics.  Can take 20 mg in the AM and eliminate the 10 mg 3 times a day.  We can reduce the dose. If he wants to he can take 20 mg when he gets home, but this may keep him awake in the night. His heart function is still less than normal and he can easily go back into heart failure if he doesn't take the diuretic.  I am decreasing the dose to 20 mg from 30 mg daily. But if he starts to gain fluid weight he will need to take the extra dose of 10 mg,   Patient identification verified by 2 forms. Jeremiah Rail, RN    Called and spoke to patient  Relayed provider message  RN reviewed instruction/education  RN provided education regarding Rx and HF  RN strongly encouraged patient to check daily weights  Advised patient to establish with new PCP and cardiology for future appointments  Patient aware Rx sent to preferred pharmacy  Patient agreeable, no questions or concerns at this time

## 2023-03-14 NOTE — Telephone Encounter (Signed)
@  [facility] provider -patient has not been seen in the office and no future appts

## 2023-03-14 NOTE — Telephone Encounter (Signed)
Agreed we have not seen the patient.   Mckenna Gamm NP-C  10:38 AM

## 2023-03-14 NOTE — Addendum Note (Signed)
Addended by: Marilynn Rail on: 03/14/2023 03:22 PM   Modules accepted: Orders

## 2023-03-14 NOTE — Telephone Encounter (Signed)
Pt called in for update for clarification on this med

## 2023-03-15 NOTE — Telephone Encounter (Signed)
Patient does not want to see our office, he has new insurance and we are not in network. FYI

## 2023-03-15 NOTE — Telephone Encounter (Signed)
No answer, Left message to return call.

## 2023-11-04 ENCOUNTER — Other Ambulatory Visit: Payer: Self-pay | Admitting: Adult Health

## 2023-11-07 ENCOUNTER — Other Ambulatory Visit: Payer: Self-pay | Admitting: Adult Health
